# Patient Record
Sex: Female | Born: 1960 | Race: White | Hispanic: No | Marital: Married | Smoking: Current some day smoker
Health system: Southern US, Community
[De-identification: ages and names within clinical notes are randomized; demographics above are authoritative.]

## PROBLEM LIST (undated history)

## (undated) DIAGNOSIS — G4700 Insomnia, unspecified: Secondary | ICD-10-CM

## (undated) DIAGNOSIS — K649 Unspecified hemorrhoids: Secondary | ICD-10-CM

## (undated) DIAGNOSIS — R600 Localized edema: Secondary | ICD-10-CM

## (undated) DIAGNOSIS — T4145XA Adverse effect of unspecified anesthetic, initial encounter: Secondary | ICD-10-CM

## (undated) DIAGNOSIS — G459 Transient cerebral ischemic attack, unspecified: Secondary | ICD-10-CM

## (undated) DIAGNOSIS — R350 Frequency of micturition: Secondary | ICD-10-CM

## (undated) DIAGNOSIS — I809 Phlebitis and thrombophlebitis of unspecified site: Secondary | ICD-10-CM

## (undated) DIAGNOSIS — M797 Fibromyalgia: Secondary | ICD-10-CM

## (undated) DIAGNOSIS — F419 Anxiety disorder, unspecified: Secondary | ICD-10-CM

## (undated) DIAGNOSIS — M2550 Pain in unspecified joint: Secondary | ICD-10-CM

## (undated) DIAGNOSIS — I10 Essential (primary) hypertension: Secondary | ICD-10-CM

## (undated) DIAGNOSIS — Z8709 Personal history of other diseases of the respiratory system: Secondary | ICD-10-CM

## (undated) DIAGNOSIS — M810 Age-related osteoporosis without current pathological fracture: Secondary | ICD-10-CM

## (undated) DIAGNOSIS — M109 Gout, unspecified: Secondary | ICD-10-CM

## (undated) DIAGNOSIS — M62838 Other muscle spasm: Secondary | ICD-10-CM

## (undated) DIAGNOSIS — R0602 Shortness of breath: Secondary | ICD-10-CM

## (undated) DIAGNOSIS — G8929 Other chronic pain: Secondary | ICD-10-CM

## (undated) DIAGNOSIS — Z860100 Personal history of colon polyps, unspecified: Secondary | ICD-10-CM

## (undated) DIAGNOSIS — I509 Heart failure, unspecified: Secondary | ICD-10-CM

## (undated) DIAGNOSIS — IMO0002 Reserved for concepts with insufficient information to code with codable children: Secondary | ICD-10-CM

## (undated) DIAGNOSIS — K5792 Diverticulitis of intestine, part unspecified, without perforation or abscess without bleeding: Secondary | ICD-10-CM

## (undated) DIAGNOSIS — F329 Major depressive disorder, single episode, unspecified: Secondary | ICD-10-CM

## (undated) DIAGNOSIS — Z87442 Personal history of urinary calculi: Secondary | ICD-10-CM

## (undated) DIAGNOSIS — Z9889 Other specified postprocedural states: Secondary | ICD-10-CM

## (undated) DIAGNOSIS — J439 Emphysema, unspecified: Secondary | ICD-10-CM

## (undated) DIAGNOSIS — G4730 Sleep apnea, unspecified: Secondary | ICD-10-CM

## (undated) DIAGNOSIS — G43909 Migraine, unspecified, not intractable, without status migrainosus: Secondary | ICD-10-CM

## (undated) DIAGNOSIS — K449 Diaphragmatic hernia without obstruction or gangrene: Secondary | ICD-10-CM

## (undated) DIAGNOSIS — J189 Pneumonia, unspecified organism: Secondary | ICD-10-CM

## (undated) DIAGNOSIS — E785 Hyperlipidemia, unspecified: Secondary | ICD-10-CM

## (undated) DIAGNOSIS — R112 Nausea with vomiting, unspecified: Secondary | ICD-10-CM

## (undated) DIAGNOSIS — K219 Gastro-esophageal reflux disease without esophagitis: Secondary | ICD-10-CM

## (undated) DIAGNOSIS — J45909 Unspecified asthma, uncomplicated: Secondary | ICD-10-CM

## (undated) DIAGNOSIS — F32A Depression, unspecified: Secondary | ICD-10-CM

## (undated) DIAGNOSIS — M549 Dorsalgia, unspecified: Secondary | ICD-10-CM

## (undated) DIAGNOSIS — J449 Chronic obstructive pulmonary disease, unspecified: Secondary | ICD-10-CM

## (undated) DIAGNOSIS — M2540 Effusion, unspecified joint: Secondary | ICD-10-CM

## (undated) DIAGNOSIS — T8859XA Other complications of anesthesia, initial encounter: Secondary | ICD-10-CM

## (undated) DIAGNOSIS — G629 Polyneuropathy, unspecified: Secondary | ICD-10-CM

## (undated) DIAGNOSIS — I639 Cerebral infarction, unspecified: Secondary | ICD-10-CM

## (undated) DIAGNOSIS — K2970 Gastritis, unspecified, without bleeding: Secondary | ICD-10-CM

## (undated) DIAGNOSIS — G35 Multiple sclerosis: Secondary | ICD-10-CM

## (undated) DIAGNOSIS — E039 Hypothyroidism, unspecified: Secondary | ICD-10-CM

## (undated) HISTORY — PX: BACK SURGERY: SHX140

## (undated) HISTORY — DX: Localized edema: R60.0

## (undated) HISTORY — PX: CHOLECYSTECTOMY: SHX55

## (undated) HISTORY — DX: Essential (primary) hypertension: I10

## (undated) HISTORY — PX: OTHER SURGICAL HISTORY: SHX169

## (undated) HISTORY — PX: COLONOSCOPY: SHX174

## (undated) HISTORY — PX: CARDIAC CATHETERIZATION: SHX172

## (undated) HISTORY — PX: BLADDER SUSPENSION: SHX72

## (undated) HISTORY — DX: Fibromyalgia: M79.7

## (undated) HISTORY — PX: ABDOMINAL HYSTERECTOMY: SHX81

## (undated) HISTORY — DX: Gastritis, unspecified, without bleeding: K29.70

## (undated) HISTORY — DX: Gastro-esophageal reflux disease without esophagitis: K21.9

## (undated) HISTORY — PX: CARPAL TUNNEL RELEASE: SHX101

---

## 1997-12-15 ENCOUNTER — Ambulatory Visit (HOSPITAL_COMMUNITY): Admission: RE | Admit: 1997-12-15 | Discharge: 1997-12-15 | Payer: Self-pay | Admitting: General Surgery

## 1998-02-05 ENCOUNTER — Other Ambulatory Visit: Admission: RE | Admit: 1998-02-05 | Discharge: 1998-02-05 | Payer: Self-pay | Admitting: Obstetrics

## 1998-02-05 ENCOUNTER — Encounter: Admission: RE | Admit: 1998-02-05 | Discharge: 1998-02-05 | Payer: Self-pay | Admitting: Obstetrics

## 1998-02-12 ENCOUNTER — Ambulatory Visit (HOSPITAL_COMMUNITY): Admission: RE | Admit: 1998-02-12 | Discharge: 1998-02-12 | Payer: Self-pay | Admitting: Obstetrics

## 1998-03-05 ENCOUNTER — Encounter: Admission: RE | Admit: 1998-03-05 | Discharge: 1998-03-05 | Payer: Self-pay | Admitting: Obstetrics

## 1998-03-19 ENCOUNTER — Encounter: Admission: RE | Admit: 1998-03-19 | Discharge: 1998-03-19 | Payer: Self-pay | Admitting: Obstetrics

## 1998-04-16 ENCOUNTER — Encounter: Admission: RE | Admit: 1998-04-16 | Discharge: 1998-04-16 | Payer: Self-pay | Admitting: Obstetrics

## 1998-04-23 ENCOUNTER — Encounter: Admission: RE | Admit: 1998-04-23 | Discharge: 1998-04-23 | Payer: Self-pay | Admitting: Obstetrics

## 1998-04-30 ENCOUNTER — Encounter: Admission: RE | Admit: 1998-04-30 | Discharge: 1998-04-30 | Payer: Self-pay | Admitting: Obstetrics

## 1998-07-30 ENCOUNTER — Encounter: Admission: RE | Admit: 1998-07-30 | Discharge: 1998-07-30 | Payer: Self-pay | Admitting: Obstetrics

## 1998-11-25 ENCOUNTER — Observation Stay (HOSPITAL_COMMUNITY): Admission: RE | Admit: 1998-11-25 | Discharge: 1998-11-27 | Payer: Self-pay | Admitting: Urology

## 1998-12-05 ENCOUNTER — Emergency Department (HOSPITAL_COMMUNITY): Admission: EM | Admit: 1998-12-05 | Discharge: 1998-12-05 | Payer: Self-pay

## 1998-12-23 ENCOUNTER — Ambulatory Visit (HOSPITAL_COMMUNITY): Admission: RE | Admit: 1998-12-23 | Discharge: 1998-12-23 | Payer: Self-pay | Admitting: Urology

## 1999-02-18 ENCOUNTER — Encounter: Admission: RE | Admit: 1999-02-18 | Discharge: 1999-02-18 | Payer: Self-pay | Admitting: Obstetrics

## 1999-03-26 ENCOUNTER — Ambulatory Visit (HOSPITAL_COMMUNITY): Admission: RE | Admit: 1999-03-26 | Discharge: 1999-03-26 | Payer: Self-pay | Admitting: Cardiology

## 2001-02-12 ENCOUNTER — Emergency Department (HOSPITAL_COMMUNITY): Admission: EM | Admit: 2001-02-12 | Discharge: 2001-02-12 | Payer: Self-pay | Admitting: Emergency Medicine

## 2001-02-12 ENCOUNTER — Encounter: Payer: Self-pay | Admitting: Emergency Medicine

## 2001-04-03 ENCOUNTER — Ambulatory Visit (HOSPITAL_COMMUNITY): Admission: RE | Admit: 2001-04-03 | Discharge: 2001-04-03 | Payer: Self-pay | Admitting: Pediatrics

## 2001-04-03 ENCOUNTER — Encounter: Payer: Self-pay | Admitting: Pediatrics

## 2001-12-14 ENCOUNTER — Encounter: Payer: Self-pay | Admitting: Emergency Medicine

## 2001-12-14 ENCOUNTER — Emergency Department (HOSPITAL_COMMUNITY): Admission: EM | Admit: 2001-12-14 | Discharge: 2001-12-14 | Payer: Self-pay | Admitting: Emergency Medicine

## 2002-01-01 ENCOUNTER — Encounter: Payer: Self-pay | Admitting: Pediatrics

## 2002-01-01 ENCOUNTER — Ambulatory Visit (HOSPITAL_COMMUNITY): Admission: RE | Admit: 2002-01-01 | Discharge: 2002-01-01 | Payer: Self-pay | Admitting: Pediatrics

## 2002-03-03 ENCOUNTER — Inpatient Hospital Stay (HOSPITAL_COMMUNITY): Admission: EM | Admit: 2002-03-03 | Discharge: 2002-03-06 | Payer: Self-pay | Admitting: Internal Medicine

## 2002-03-03 ENCOUNTER — Encounter: Payer: Self-pay | Admitting: Internal Medicine

## 2002-03-11 ENCOUNTER — Encounter: Payer: Self-pay | Admitting: Pediatrics

## 2002-03-11 ENCOUNTER — Ambulatory Visit (HOSPITAL_COMMUNITY): Admission: RE | Admit: 2002-03-11 | Discharge: 2002-03-11 | Payer: Self-pay | Admitting: Pediatrics

## 2003-05-17 ENCOUNTER — Emergency Department (HOSPITAL_COMMUNITY): Admission: EM | Admit: 2003-05-17 | Discharge: 2003-05-17 | Payer: Self-pay | Admitting: Emergency Medicine

## 2003-12-29 ENCOUNTER — Encounter (HOSPITAL_COMMUNITY): Admission: RE | Admit: 2003-12-29 | Discharge: 2004-01-28 | Payer: Self-pay | Admitting: Family Medicine

## 2004-01-10 ENCOUNTER — Emergency Department (HOSPITAL_COMMUNITY): Admission: EM | Admit: 2004-01-10 | Discharge: 2004-01-10 | Payer: Self-pay | Admitting: Emergency Medicine

## 2004-04-06 ENCOUNTER — Ambulatory Visit (HOSPITAL_COMMUNITY): Admission: RE | Admit: 2004-04-06 | Discharge: 2004-04-06 | Payer: Self-pay | Admitting: Pediatrics

## 2004-09-13 ENCOUNTER — Inpatient Hospital Stay (HOSPITAL_COMMUNITY): Admission: AD | Admit: 2004-09-13 | Discharge: 2004-09-16 | Payer: Self-pay | Admitting: Family Medicine

## 2004-10-11 ENCOUNTER — Ambulatory Visit (HOSPITAL_COMMUNITY): Admission: RE | Admit: 2004-10-11 | Discharge: 2004-10-11 | Payer: Self-pay | Admitting: Family Medicine

## 2005-01-07 ENCOUNTER — Emergency Department (HOSPITAL_COMMUNITY): Admission: EM | Admit: 2005-01-07 | Discharge: 2005-01-08 | Payer: Self-pay | Admitting: Emergency Medicine

## 2005-03-03 ENCOUNTER — Ambulatory Visit (HOSPITAL_COMMUNITY): Admission: RE | Admit: 2005-03-03 | Discharge: 2005-03-03 | Payer: Self-pay | Admitting: Pediatrics

## 2006-03-24 ENCOUNTER — Ambulatory Visit (HOSPITAL_COMMUNITY): Admission: RE | Admit: 2006-03-24 | Discharge: 2006-03-24 | Payer: Self-pay | Admitting: Pediatrics

## 2006-03-29 ENCOUNTER — Ambulatory Visit (HOSPITAL_COMMUNITY): Admission: RE | Admit: 2006-03-29 | Discharge: 2006-03-29 | Payer: Self-pay | Admitting: Family Medicine

## 2006-04-04 ENCOUNTER — Ambulatory Visit (HOSPITAL_COMMUNITY): Admission: RE | Admit: 2006-04-04 | Discharge: 2006-04-04 | Payer: Self-pay | Admitting: Pediatrics

## 2006-04-06 ENCOUNTER — Encounter (HOSPITAL_COMMUNITY): Admission: RE | Admit: 2006-04-06 | Discharge: 2006-05-06 | Payer: Self-pay | Admitting: Pediatrics

## 2006-04-14 ENCOUNTER — Encounter (INDEPENDENT_AMBULATORY_CARE_PROVIDER_SITE_OTHER): Payer: Self-pay | Admitting: Specialist

## 2006-04-14 ENCOUNTER — Ambulatory Visit (HOSPITAL_COMMUNITY): Admission: RE | Admit: 2006-04-14 | Discharge: 2006-04-14 | Payer: Self-pay | Admitting: General Surgery

## 2006-09-04 ENCOUNTER — Ambulatory Visit (HOSPITAL_COMMUNITY): Admission: RE | Admit: 2006-09-04 | Discharge: 2006-09-04 | Payer: Self-pay | Admitting: Pediatrics

## 2007-10-23 ENCOUNTER — Ambulatory Visit: Payer: Self-pay | Admitting: Gastroenterology

## 2007-10-25 ENCOUNTER — Emergency Department (HOSPITAL_COMMUNITY): Admission: EM | Admit: 2007-10-25 | Discharge: 2007-10-25 | Payer: Self-pay | Admitting: Emergency Medicine

## 2007-10-29 ENCOUNTER — Encounter: Payer: Self-pay | Admitting: Gastroenterology

## 2007-10-29 ENCOUNTER — Ambulatory Visit: Payer: Self-pay | Admitting: Gastroenterology

## 2007-10-29 ENCOUNTER — Ambulatory Visit (HOSPITAL_COMMUNITY): Admission: RE | Admit: 2007-10-29 | Discharge: 2007-10-29 | Payer: Self-pay | Admitting: Gastroenterology

## 2007-10-29 HISTORY — PX: ESOPHAGOGASTRODUODENOSCOPY: SHX1529

## 2007-11-06 ENCOUNTER — Ambulatory Visit (HOSPITAL_COMMUNITY): Admission: RE | Admit: 2007-11-06 | Discharge: 2007-11-06 | Payer: Self-pay | Admitting: Gastroenterology

## 2008-01-29 ENCOUNTER — Ambulatory Visit: Payer: Self-pay | Admitting: Gastroenterology

## 2008-01-31 ENCOUNTER — Ambulatory Visit: Payer: Self-pay | Admitting: Gastroenterology

## 2008-01-31 ENCOUNTER — Ambulatory Visit (HOSPITAL_COMMUNITY): Admission: RE | Admit: 2008-01-31 | Discharge: 2008-01-31 | Payer: Self-pay | Admitting: Gastroenterology

## 2008-01-31 ENCOUNTER — Encounter: Payer: Self-pay | Admitting: Gastroenterology

## 2008-01-31 HISTORY — PX: SIGMOIDOSCOPY: SUR1295

## 2008-08-15 ENCOUNTER — Ambulatory Visit (HOSPITAL_COMMUNITY): Admission: RE | Admit: 2008-08-15 | Discharge: 2008-08-15 | Payer: Self-pay | Admitting: Pediatrics

## 2008-09-14 ENCOUNTER — Encounter: Admission: RE | Admit: 2008-09-14 | Discharge: 2008-09-14 | Payer: Self-pay | Admitting: Neurosurgery

## 2009-01-14 ENCOUNTER — Observation Stay (HOSPITAL_COMMUNITY): Admission: EM | Admit: 2009-01-14 | Discharge: 2009-01-16 | Payer: Self-pay | Admitting: *Deleted

## 2009-09-15 ENCOUNTER — Ambulatory Visit (HOSPITAL_COMMUNITY): Admission: RE | Admit: 2009-09-15 | Discharge: 2009-09-15 | Payer: Self-pay | Admitting: Pediatrics

## 2010-06-29 ENCOUNTER — Ambulatory Visit (HOSPITAL_COMMUNITY): Admission: RE | Admit: 2010-06-29 | Discharge: 2010-06-29 | Payer: Self-pay | Admitting: Pediatrics

## 2010-08-11 ENCOUNTER — Ambulatory Visit (HOSPITAL_COMMUNITY)
Admission: RE | Admit: 2010-08-11 | Discharge: 2010-08-11 | Payer: Self-pay | Admitting: Physical Medicine and Rehabilitation

## 2010-09-14 ENCOUNTER — Encounter: Payer: Self-pay | Admitting: Gastroenterology

## 2010-09-24 ENCOUNTER — Ambulatory Visit (HOSPITAL_COMMUNITY)
Admission: RE | Admit: 2010-09-24 | Discharge: 2010-09-24 | Payer: Self-pay | Admitting: Physical Medicine and Rehabilitation

## 2010-10-05 ENCOUNTER — Ambulatory Visit (HOSPITAL_COMMUNITY)
Admission: RE | Admit: 2010-10-05 | Discharge: 2010-10-05 | Payer: Self-pay | Source: Home / Self Care | Admitting: Gastroenterology

## 2010-11-29 ENCOUNTER — Encounter: Payer: Self-pay | Admitting: Pediatrics

## 2010-12-07 NOTE — Letter (Signed)
Summary: TRIAGE ORDER  TRIAGE ORDER   Imported By: Ave Filter 09/14/2010 15:48:45  _____________________________________________________________________  External Attachment:    Type:   Image     Comment:   External Document  Appended Document: TRIAGE ORDER PT CANCELLED APPOINTMENT SHE SAID HER SISTER HAS HAD A HEARTATTACK AND SHE HAS TO GO BE WITH HER AND SHE WILL CALL us BACK TO R/S APPT/LAW

## 2011-01-18 LAB — BASIC METABOLIC PANEL
CO2: 32 mEq/L (ref 19–32)
Glucose, Bld: 130 mg/dL — ABNORMAL HIGH (ref 70–99)
Potassium: 4.9 mEq/L (ref 3.5–5.1)
Sodium: 136 mEq/L (ref 135–145)

## 2011-01-18 LAB — HEMOGLOBIN AND HEMATOCRIT, BLOOD
HCT: 39.9 % (ref 36.0–46.0)
Hemoglobin: 14 g/dL (ref 12.0–15.0)

## 2011-01-18 LAB — TSH: TSH: 3.151 u[IU]/mL (ref 0.350–4.500)

## 2011-02-17 LAB — URINE MICROSCOPIC-ADD ON

## 2011-02-17 LAB — URINALYSIS, ROUTINE W REFLEX MICROSCOPIC
Bilirubin Urine: NEGATIVE
Glucose, UA: NEGATIVE mg/dL
Ketones, ur: NEGATIVE mg/dL
Nitrite: NEGATIVE
Specific Gravity, Urine: 1.013 (ref 1.005–1.030)
pH: 6.5 (ref 5.0–8.0)

## 2011-02-17 LAB — GLUCOSE, CAPILLARY
Glucose-Capillary: 108 mg/dL — ABNORMAL HIGH (ref 70–99)
Glucose-Capillary: 109 mg/dL — ABNORMAL HIGH (ref 70–99)
Glucose-Capillary: 91 mg/dL (ref 70–99)
Glucose-Capillary: 99 mg/dL (ref 70–99)

## 2011-02-17 LAB — CBC
HCT: 39.3 % (ref 36.0–46.0)
Hemoglobin: 13.9 g/dL (ref 12.0–15.0)
Platelets: 193 10*3/uL (ref 150–400)
WBC: 4.6 10*3/uL (ref 4.0–10.5)

## 2011-02-17 LAB — COMPREHENSIVE METABOLIC PANEL
ALT: 51 U/L — ABNORMAL HIGH (ref 0–35)
Albumin: 3.7 g/dL (ref 3.5–5.2)
Alkaline Phosphatase: 83 U/L (ref 39–117)
BUN: 6 mg/dL (ref 6–23)
Chloride: 100 mEq/L (ref 96–112)
Glucose, Bld: 156 mg/dL — ABNORMAL HIGH (ref 70–99)
Potassium: 3.4 mEq/L — ABNORMAL LOW (ref 3.5–5.1)
Sodium: 139 mEq/L (ref 135–145)
Total Bilirubin: 0.5 mg/dL (ref 0.3–1.2)

## 2011-02-17 LAB — DIFFERENTIAL
Basophils Absolute: 0 10*3/uL (ref 0.0–0.1)
Basophils Relative: 1 % (ref 0–1)
Eosinophils Absolute: 0.2 10*3/uL (ref 0.0–0.7)
Monocytes Absolute: 0.3 10*3/uL (ref 0.1–1.0)
Neutro Abs: 2 10*3/uL (ref 1.7–7.7)

## 2011-02-17 LAB — APTT: aPTT: 29 seconds (ref 24–37)

## 2011-02-25 ENCOUNTER — Other Ambulatory Visit: Payer: Self-pay | Admitting: Urology

## 2011-02-25 ENCOUNTER — Ambulatory Visit (INDEPENDENT_AMBULATORY_CARE_PROVIDER_SITE_OTHER): Payer: 59 | Admitting: Urology

## 2011-02-25 DIAGNOSIS — R109 Unspecified abdominal pain: Secondary | ICD-10-CM

## 2011-02-25 DIAGNOSIS — N3946 Mixed incontinence: Secondary | ICD-10-CM

## 2011-02-25 DIAGNOSIS — R1031 Right lower quadrant pain: Secondary | ICD-10-CM

## 2011-02-28 ENCOUNTER — Ambulatory Visit (HOSPITAL_COMMUNITY)
Admission: RE | Admit: 2011-02-28 | Discharge: 2011-02-28 | Disposition: A | Discharge status: Home / Self Care | Payer: 59 | Source: Ambulatory Visit | Attending: Urology | Admitting: Urology

## 2011-02-28 DIAGNOSIS — R1903 Right lower quadrant abdominal swelling, mass and lump: Secondary | ICD-10-CM | POA: Insufficient documentation

## 2011-02-28 DIAGNOSIS — R1031 Right lower quadrant pain: Secondary | ICD-10-CM | POA: Insufficient documentation

## 2011-03-11 ENCOUNTER — Ambulatory Visit: Payer: 59 | Admitting: Urology

## 2011-03-11 ENCOUNTER — Emergency Department (HOSPITAL_COMMUNITY)
Admission: EM | Admit: 2011-03-11 | Discharge: 2011-03-11 | Disposition: A | Discharge status: Home / Self Care | Payer: 59 | Attending: Emergency Medicine | Admitting: Emergency Medicine

## 2011-03-11 ENCOUNTER — Emergency Department (HOSPITAL_COMMUNITY): Payer: 59

## 2011-03-11 DIAGNOSIS — R079 Chest pain, unspecified: Secondary | ICD-10-CM | POA: Insufficient documentation

## 2011-03-11 DIAGNOSIS — E039 Hypothyroidism, unspecified: Secondary | ICD-10-CM | POA: Insufficient documentation

## 2011-03-11 DIAGNOSIS — I10 Essential (primary) hypertension: Secondary | ICD-10-CM | POA: Insufficient documentation

## 2011-03-11 DIAGNOSIS — Z79899 Other long term (current) drug therapy: Secondary | ICD-10-CM | POA: Insufficient documentation

## 2011-03-11 DIAGNOSIS — E119 Type 2 diabetes mellitus without complications: Secondary | ICD-10-CM | POA: Insufficient documentation

## 2011-03-11 DIAGNOSIS — R071 Chest pain on breathing: Secondary | ICD-10-CM | POA: Insufficient documentation

## 2011-03-11 DIAGNOSIS — G8929 Other chronic pain: Secondary | ICD-10-CM | POA: Insufficient documentation

## 2011-03-11 LAB — BASIC METABOLIC PANEL
CO2: 33 mEq/L — ABNORMAL HIGH (ref 19–32)
Calcium: 9.1 mg/dL (ref 8.4–10.5)
Creatinine, Ser: 0.62 mg/dL (ref 0.4–1.2)
GFR calc Af Amer: >60 mL/min (ref >60)
Sodium: 140 mEq/L (ref 135–145)

## 2011-03-11 LAB — POCT CARDIAC MARKERS
Myoglobin, poc: 51.2 ng/mL (ref 12–200)
Troponin i, poc: <0.05 ng/mL (ref 0.00–0.09)

## 2011-03-11 LAB — DIFFERENTIAL
Basophils Absolute: 0 10*3/uL (ref 0.0–0.1)
Basophils Relative: 0 % (ref 0–1)
Lymphocytes Relative: 34 % (ref 12–46)
Monocytes Absolute: 0.9 10*3/uL (ref 0.1–1.0)
Monocytes Relative: 9 % (ref 3–12)
Neutro Abs: 5.6 10*3/uL (ref 1.7–7.7)
Neutrophils Relative %: 57 % (ref 43–77)

## 2011-03-11 LAB — CBC
HCT: 37.5 % (ref 36.0–46.0)
Hemoglobin: 12.6 g/dL (ref 12.0–15.0)
MCH: 29.1 pg (ref 26.0–34.0)
MCHC: 33.6 g/dL (ref 30.0–36.0)
RBC: 4.33 MIL/uL (ref 3.87–5.11)

## 2011-03-22 NOTE — Op Note (Signed)
[name], [name]                ACCOUNT NO.:  [id number]   MEDICAL RECORD NO.:  [id number]          PATIENT TYPE:  AMB   LOCATION:  DAY                           FACILITY:  [facility]   PHYSICIAN:  Kassie Mends, M.D.      DATE OF BIRTH:  07-19-1961   DATE OF PROCEDURE:  01/31/2008  DATE OF DISCHARGE:                               OPERATIVE REPORT   REFERRING Winni Ehrhard:  Francoise Schaumann. Halm, DO.   PROCEDURE:  Sigmoidoscopy.   INDICATION FOR EXAM:  [name] is a 50 year old female who presented  as an outpatient complaining of rectal bleeding.  She has also  complained of having difficulty having a bowel movement.   FINDINGS:  1. The patient reports taking entire bowel prep and she was clear.      Began to see formed stool beginning approximately 40-50 cm from the      anal verge.  No liquid stool was seen in the lumen.  The      colonoscopy was changed to a sigmoidoscopy.  2. Rare sigmoid diverticula.  3. Large internal hemorrhoids.  4. Small rectal polyp removed via cold forceps.   DIAGNOSIS:  [name] rectal bleeding is secondary to large internal  hemorrhoids.   RECOMMENDATIONS:  1. Screening colonoscopy in 5 years with a 2-day bowel prep.  2. She should follow a high-fiber diet.  She was given information on      high-fiber diet, constipation, diverticulosis and hemorrhoids.  If      her rectal bleeding continues, then she should use Anusol-HC      suppositories every 12 hours for 14 days.  3. Will add a stool softener twice a day.   MEDICATIONS:  1. Fentanyl 125 mcg IV.  2. Versed 6 mg IV.  3. Phenergan 25 mg IV.   PROCEDURE TECHNIQUE:  Physical exam was performed.  Informed consent was  obtained from the patient after explaining the benefits, risks and  alternatives to the procedure.  The patient was connected to the monitor  and placed in the left lateral position.  Continuous oxygen was provided  by nasal cannula and IV medicine administered through an indwelling  cannula.  After administration of sedation and rectal exam, the  patient's rectum was intubated and the scope was advanced under direct  visualization to approximately 50 cm from the anal verge.  The  colonoscopy was changed to a sigmoidoscopy due to a large amount of  formed stool in the lumen.  The scope was removed slowly by carefully  examining the color, texture, anatomy and integrity of the mucosa on the  way out.  The patient was recovered in endoscopy and discharged home in  satisfactory condition.      Kassie Mends, M.D.  Electronically Signed     SM/MEDQ  D:  01/31/2008  T:  01/31/2008  Job:  [id number]   cc:   Francoise Schaumann. Milford Cage DO, FAAP  Fax: [phone]

## 2011-03-22 NOTE — Op Note (Signed)
[name], [name]                ACCOUNT NO.:  [id number]   MEDICAL RECORD NO.:  [id number]          PATIENT TYPE:  AMB   LOCATION:  DAY                           FACILITY:  [facility]   PHYSICIAN:  Kassie Mends, M.D.      DATE OF BIRTH:  [birth year]   DATE OF PROCEDURE:  10/29/2007  DATE OF DISCHARGE:                               OPERATIVE REPORT   PROCEDURE:  Esophagogastroduodenoscopy with cold forceps biopsy and  Bravo capsule placement.   INDICATION FOR EXAM:  [name] is a 50 year old female who complains  of having indigestion and nausea for 10-12 years.  She says sometimes  after eating her stomach feels like a rock.  She reports getting choked.  She has been on Prilosec and still complains of burning and bubbling in  her esophagus.  She eats Tums to treat her symptoms.  The EGD is being  performed to evaluate new-onset dyspepsia and to place a Bravo capsule  to evaluate for uncontrolled gastroesophageal reflux disease.   FINDINGS:  1. Normal esophagus without evidence of Barrett's, mass, erosion,      ulceration or stricture.  2. Occasional erosions seen in the antrum with patchy erythema.      Biopsies obtained via cold forceps to evaluate for H. pylori      gastritis.  3. Normal duodenal bulb and second portion of the duodenum.  4. GE junction located at 39 cm from the teeth.  The Bravo capsule was      placed 33 cm from the teeth.   DIAGNOSES:  1. Gastritis.  2. Gastric polyps.   RECOMMENDATIONS:  1. We will call [name] with the results of her gastric polyps.  We      will also call her with the results of the antral biopsies.  2. No aspirin or NSAIDs for 30 days.  3. No anticoagulation for 7 days.  4. She should resume her previous diet but avoid gastric irritants.      She is given a handout on gastric irritants and gastritis.  5. She needs an abdominal ultrasound to complete her evaluation for      elevated liver enzymes.  The most likely etiology for  mildly      elevated liver enzymes is fatty liver disease.   MEDICATIONS:  1. Demerol 50 mg IV.  2. Versed 6 mg IV.   PROCEDURE TECHNIQUE:  Physical exam was performed.  Informed consent was  obtained from the patient after explaining the benefits, risks and  alternatives to the procedure.  The patient was connected to the monitor  and placed in the left lateral position.  Continuous oxygen was provided  by nasal cannula and IV medicine administered through an indwelling  cannula.  After administration of sedation, the patient's esophagus was  intubated and the scope was advanced under direct visualization to the  second portion of the duodenum.  The scope was withdrawn slowly by  carefully examining the color, texture, anatomy and integrity of the  mucosa on the way out.  The Bravo capsule was introduced and advanced to  33 cm from the teeth.  Suction was applied and the device was released.  The patient's esophagus was intubated with the diagnostic gastroscope.  Adherence to the sidewalls of the esophagus was confirmed.  The  introducer and the scope were withdrawn.  The patient was recovered in  endoscopy and discharged home in satisfactory condition.   ADDENDUM:  Path showed benign polyps and chronic gastrtis.      Kassie Mends, M.D.  Electronically Signed     SM/MEDQ  D:  10/29/2007  T:  10/29/2007  Job:  [id number]   cc:   Francoise Schaumann. Milford Cage DO, FAAP  Fax: [phone]

## 2011-03-22 NOTE — Op Note (Signed)
NAMEMERISA, JULIO                ACCOUNT NO.:  [id number]   MEDICAL RECORD NO.:  [id number]          PATIENT TYPE:  AMB   LOCATION:  DAY                           FACILITY:  [facility]   PHYSICIAN:  Kassie Mends, M.D.      DATE OF BIRTH:  27-May-1961   DATE OF PROCEDURE:  10/29/2007  DATE OF DISCHARGE:  10/29/2007                               OPERATIVE REPORT   REFERRING PHYSICIAN:  Vivia Ewing, DO, FAAP.   PROCEDURE:  Bravo capsule study for 48 hours.   DATE OF BRAVO READ:  October 31, 2007.   INDICATION FOR EXAM:  Ms. Kesselman is a 50 year old female who complains  of persistent heartburn and indigestion in spite of once daily  omeprazole.  She carries a diagnosis of gastroesophageal reflux disease  for the last 10-12 years.  Bravo capsule was placed on October 29, 2007.   FINDINGS:  On day 1, Ms. Rigg had 90 episodes of reflux.  Seven  episodes lasted greater than 5 minutes.  The longest duration of reflux  was 14 minutes.  She spent 133 minutes at a pH of less than 4.  The  DeMeester score on day 1 was 36.1 (normal less than 14.72).  On day 2,  she had a total of 113 episodes of reflux.  Six episodes lasted greater  than 5 minutes.  Her longest episode of reflux lasted 23 minutes.  She  spent 145 minutes at a pH less than 4.  Her DeMeester score on day 2 was  34.   For the 48 hour study, she had 203 episodes of reflux.  Thirteen  episodes lasted greater 5 minutes.  The longest duration of reflux was  23 minutes.  She spent 278 minutes at a pH less than 4.  Her symptom-  associated probability was 99.7% other and 60.3% regurgitation.   DIAGNOSIS:  Gastroesophageal reflux disease inadequately controlled on  once a day proton pump inhibitor.   RECOMMENDATIONS:  1. Ms. Dowse should increase her omeprazole to 20 mg 30 minutes      before her first and last meal.  If her symptoms persist then will      consider changing her to Zegerid.  2. She should follow the [facility]  recommendations for the      management of gastroesophageal reflux disease.  She should avoid      carbonated beverages.  3. OPV with [facility] in late Jan or Feb 2009.      Kassie Mends, M.D.  Electronically Signed     SM/MEDQ  D:  11/03/2007  T:  11/04/2007  Job:  [id number]   cc:   Francoise Schaumann. Milford Cage DO, FAAP  Fax: [phone]

## 2011-03-22 NOTE — Consult Note (Signed)
NAMEGENTRY, SEEBER                ACCOUNT NO.:  [id number]   MEDICAL RECORD NO.:  [id number]          PATIENT TYPE:  AMB   LOCATION:  DAY                           FACILITY:  [facility]   PHYSICIAN:  Kassie Mends, M.D.      DATE OF BIRTH:  December 15, 1960   DATE OF CONSULTATION:  10/23/2007  DATE OF DISCHARGE:                                 CONSULTATION   REASON FOR CONSULTATION:  Abdominal pain.   HPI:  Ms. Ryback is a 50 year old female who has a significant past  medical history of her stomach hurting, indigestion, and nausea for  approximately 10 to 12 years.  She had her gallbladder taken out for  these symptoms and states she had some relief after that was done.  Sometimes she hurts as she eats and sometimes she hurts when she does  not eat.  She states that when she eats and stands up she feels like her  stomach is going to drop.  Sometimes, after eating, her stomach feels  like a rock.  She has a normal bowel movement every morning.  She says  her appetite is none but has not had any weight loss.  She reports  sometimes getting choked.  She takes her Prilosec around 10 a.m. before  she eats her first meal.  She complains of a burning and a bubbling in  her esophagus.  She eats Tums to treat those symptoms.  If she bends  over, the burning-tasting liquid just rolls into the back of her  throat.  It does not matter what she eats.  She denies any vomiting,  diarrhea, or nausea.  She had an upper endoscopy and a colonoscopy for  similar symptoms in the past.  She denies any use of over-the-counter  medicines, NSAIDs, turning yellow, itching, or any history of H. pylori  infection.  She does eat ice cream.  She drinks one 20-ounce Dr. Reino Kent  a day.  She has had none in the last 2 months.  She eats sugar-free gum  frequently.  She does not drink any fruit juice because it makes her  symptoms worse.   PAST MEDICAL HISTORY:  1. Fibromyalgia.  2. Hypertension.  3. Hiatal hernia.  4. Fluid  in her legs.   PAST SURGICAL HISTORY:  1. Hysterectomy at age 89 for fibroids, pain, and bleeding.  2. Carpal tunnel syndrome.   ALLERGIES:  1. PENICILLIN.  2. SULFA.  3. DEMEROL.   MEDICATIONS:  1. Flexeril 10 mg t.i.d.  2. Xanax 0.5 mg 1 to 2 times a day as needed.  3. Prilosec 20 mg a day.  4. Levothyroxine 150 mcg daily.  5. Depakote ER 500 mg q.h.s.  6. Metoprolol 100 mg daily.  7. Lasix 20 mg daily.  8. Norco 10/325 every 4 hours as needed for pain.   FAMILY HISTORY:  She denies any family history of colon cancer, colon  polyps, liver disease, or pancreas problems.   SOCIAL HISTORY:  She is married and has a bad back.  She denies any  tobacco use or alcohol use.  REVIEW OF SYSTEMS:  Per the HPI, otherwise all systems negative.   PHYSICAL EXAMINATION:  Weight 172 pounds.  Height 5 feet 6 inches.  BMI  27.8 (overweight).  Temperature is 97.7.  Blood pressure 120/88.  Pulse  80.  GENERAL:  She is in no apparent distress, alert and oriented x4.  HEENT:  Atraumatic, normocephalic.  Pupils equal and reactive to light.  Mouth, no oral lesions.  Posterior pharynx without erythema or exudate.  LUNGS:  Clear to auscultation bilaterally.  CARDIOVASCULAR:  Exam is a regular rhythm.  No murmur.  Normal S1 and  S2.  ABDOMEN:  Bowel sounds are present.  Soft, nontender, and nondistended.  No rebound or guarding, obese.  EXTREMITIES:  Without cyanosis, clubbing, or edema.  NEURO:  She has no focal neurologic deficits.   ASSESSMENT:  Ms. Baccam is a 50 year old female who complains of  symptoms that may be consistent with gastroesophageal reflux disease  which is poorly controlled on omeprazole 20 mg once a day.  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  The differential diagnosis includes small bowel bacterial  overgrowth.  She also reportedly has elevated liver enzymes.  She had a  isolated elevation in her ALT to 57 in June of  2008.  The rest of her  hepatic function panel was normal.  She has elevated liver enzymes which  are likely secondary to fatty liver disease.  The differential diagnosis  includes autoimmune hepatitis.   Thank you for allowing me to see Ms. Herrmann in consultation.  My  recommendations follow.   RECOMMENDATIONS:  1. She will be scheduled for an upper endoscopy on 10/29/2007 to      evaluate her acid reflux and for a Bravo study.  2. She she avoid sugar-free gum and follow the lactose-free gas and      flatulence prevention diet.  She was given a handout.  She is to      add probiotics.  She was given samples once a day.  3. We will check a hepatic function panel, anti-smooth muscle      antibody, ANA, and quantitative immunoglobulin.  4. She has a followup appointment to see me in 6 weeks.  She was given      her discharge instructions in writing.      Kassie Mends, M.D.  Electronically Signed    SM/MEDQ  D:  10/23/2007  T:  10/24/2007  Job:  [id number]   cc:   Francoise Schaumann. Milford Cage DO, FAAP  Fax: [phone]

## 2011-03-22 NOTE — H&P (Signed)
[name], [name] NO.:  [id number]   MEDICAL RECORD NO.:  [id number]          PATIENT TYPE:  INP   LOCATION:  4705                         FACILITY:  [facility]   PHYSICIAN:  Marlan Palau, M.D.  DATE OF BIRTH:  04-Apr-1961   DATE OF ADMISSION:  01/14/2009  DATE OF DISCHARGE:                              HISTORY & PHYSICAL   HISTORY OF PRESENT ILLNESS:  [name] is a 50 year old right-handed  white female, born [birth year], with a history of migraine headaches.  This patient has been seen by Dr. Vickey Huger from our group in the past  for these events.  The patient has had several events.  She claims up to  4 in the past that have been associated with left-sided weakness and  numbness.  The patient claims the last such episode occurred in the  spring of 2009.  Through [facility] medical records, patient has had events  dating the back to 2000.  The patient had another event in 2003.  The  patient claims that she began having a headache on the day prior to  admission probably 8:30 p.m.  The patient went to bed around 10:30 p.m.  and awoke around 3:30 a.m. to go the bathroom on March 10.  The patient  was doing well at that point.  Upon awakening this morning, however, the  patient noted some left-sided weakness, left-sided pain involving the  face, arm, and leg, and left-sided headache.  The patient could not  talk.  Onset of deficit was around 5:30 in the morning when she noticed  it last seen normal at 3:30 a.m.  The patient is brought in as a code  stroke, but this was cancelled.  CT scan of the brain was done.  Neurology was asked to see this patient for further evaluation.  The  patient's ability to speak has improved, but the left-sided weakness,  pain, and numbness have persisted.   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  2. Fibromyalgia.  3. Gastroesophageal reflux disease.  4.  Migraine headache.  5. Low back pain.  6. Hypertension.  7. Hypothyroidism.  8. Renal calculi.  9. Bladder resuspension procedure.  10.Gallbladder resection.  11.Hysterectomy.  12.Right carpal tunnel syndrome surgery.  13.Diabetes.  14.Gastritis.  15.Dyslipidemia.   The patient has allergy to:  1. PENICILLIN.  2. SULFA DRUGS.  3. DEMEROL.   Does not smoke or drink.   CURRENT MEDICATIONS:  1. Metformin 500 mg in the morning.  2. Nortriptyline 25 mg at bedtime.  3. Xanax 0.5 mg twice daily.  4. Metoprolol 100 mg twice daily.  5. Flexeril 10 mg 4 times daily.  6. Depakote 500 mg at bedtime.  7. Synthroid 0.175 mg daily.  8. Vicodin if needed.  9. Oxycodone 5 mg if needed.  10.Zocor 20 mg daily.   SOCIAL HISTORY:  This patient is married and lives in the [place] area.  The patient has 2 children.  Does not work.  One  daughter has multiple sclerosis.  FAMILY MEDICAL HISTORY:  Notable that mother died with stroke, diabetes,  heart disease, and hypertension.  Father is still alive with  hypercholesterolemia and heart disease.  The patient has 3 sisters and 2  brothers, most of them have heart disease, diabetes, hypertension, and  hypercholesterolemia.   REVIEW OF SYSTEMS:  Notable for episodes of feeling hot.  The patient  notes headache with migraine, currently 2-3 times a week.  The patient  does have some left neck pain that began over the last several days.  The patient does note some shortness of breath with effort.  Denies  chest pain or palpitations.  Has not any problems controlling bowels or  bladder.  Does have some slight nausea with a headache.  Denies any  blackout episodes or dizziness.   PHYSICAL EXAMINATION:  VITAL SIGNS:  Blood pressure is 155/94, heart  rate 92, respiratory rate 12, and temperature afebrile.  GENERAL:  This patient is a minimally obese white female who is alert  and cooperative at the time of examination.  HEENT:   Head is atraumatic.  Eyes; pupils are equal, round, and reactive  to light.  Discs are flat bilaterally.  NECK:  Supple.  No carotid bruits noted.  RESPIRATORY:  Clear.  CARDIOVASCULAR:  Regular rate and rhythm.  No obvious murmurs or rubs  noted.  EXTREMITIES:  Without significant edema.  ABDOMEN:  Positive bowel sounds.  No organomegaly or tenderness noted.  NEUROLOGIC:  Cranial nerves as above.  Facial symmetry is present.  The  patient has full extraocular and visual fields are full.  Speech is well  enunciated, not aphasic.  The patient notes decreased pinprick sensation  on the left face as compared to the right, splits midline with vibratory  sensation on the forehead, decreased on the left.  No aphasia noted.  Motor testing reveals poor effort with the left arm and left leg.  Left  arm drifts to the bed as does the left leg.  The patient notes almost  complete anesthesia to pinprick soft touch, and vibratory sensation on  left arm and left legs, spare the right.  The patient has no ataxia with  finger-nose-finger and heel-to-shin.  The patient extincts with double  simultaneous stimulation on left arm and left leg.  The patient has  symmetric reflexes.  Toes downgoing bilaterally.  The patient was not  ambulated.   LABORATORY VALUES:  Notable for a white count of 4.6, hemoglobin of  13.9, hematocrit of 39.3, MCV of 88.1, and platelets of 193.  Sodium  139, potassium 3.4, chloride of 100, CO2 of 27, glucose of 156, BUN of  6, creatinine 0.58, calcium 9.3, total protein 7.2, albumin of 3.7, AST  of 31, and ALT of 51.  Urinalysis reveals specific gravity of 1.013, pH  of 6.5, otherwise unremarkable with 0-2 red cells and white cells.  Chest x-ray shows streaky bibasilar atelectasis, no infiltrates.  CT of  the head shows no acute changes.   EKG reveals normal sinus rhythm, nonspecific Q wave abnormalities, heart  rate 72.   IMPRESSION:  1. Psychogenic stroke event with left  hemiparesis and hemisensory      deficit.  2. History of migraine headaches.  3. Diabetes.  4. Hypertension.  5. Dyslipidemia.  6. Fibromyalgia.   This patient has multiple risk factors for stroke, but a clinical  examination today suggests a psychogenic event.  The patient will  require workup to exclude any organic cause for her problems.  Need to  rule out a small stroke with embellishment on top of that.  The patient  has had similar events in the past and this likely represents another  event.  The patient will undergo physical and occupational therapy.  If  she does not improve, we will need to get psychiatric evaluation for  this patient.  I have discussed the diagnosis with the family.       Marlan Palau, M.D.  Electronically Signed     CKW/MEDQ  D:  01/14/2009  T:  01/14/2009  Job:  [id number]   cc:   [facility]

## 2011-03-22 NOTE — Consult Note (Signed)
NAMEELIZETH, WEINRICH                ACCOUNT NO.:  [id number]   MEDICAL RECORD NO.:  [id number]          PATIENT TYPE:  INP   LOCATION:  4705                         FACILITY:  [facility]   PHYSICIAN:  Antonietta Breach, M.D.  DATE OF BIRTH:  08-21-1961   DATE OF CONSULTATION:  01/16/2009  DATE OF DISCHARGE:  01/16/2009                                 CONSULTATION   REQUESTING PHYSICIAN:  Dr. Marlan Palau   REASON FOR CONSULTATION:  Rule out somatoform disorder, anxiety.   HISTORY OF PRESENT ILLNESS:  Mrs. Arch is a 50 year old female  admitted to the [facility] on January 14, 2009, due to left  hemiparesis.   She does describe openly a significant amount of stress in her life.  She does not have any indifference to her somatic symptoms but sees them  as a major distress.  She also describes stress at home, having a  husband as a Education officer, environmental.   She does not have any thoughts of harming herself or others.  She has no  hallucinations or delusions.  Her orientation and memory function are  intact.  She is cooperative with bedside care.   She presented with left side weakness, and she was close to receiving  tPA.   Neurology evaluated her, and her organic workup was negative.  She also  has a history of other workups that have been negative.   She does have regular feeling on edge and muscle tension with  significant worry at this point controlled by Xanax 0.5 mg b.i.d.  She  receives nortriptyline 25 mg at bedtime.   It is noted that her husband is forced to wait on her when she exhibits  these neurologic symptoms.   PAST PSYCHIATRIC HISTORY:  Mrs. Chrestman does have a history of long-term  anxiety with symptoms as described above.   In review of the past medical record, Xanax was listed in March 2009.   FAMILY PSYCHIATRIC HISTORY:  None known.   SOCIAL HISTORY:  Mrs. Granderson states they have a mutually supportive  marriage.  However, he is away from the home a lot.  She does not  use  alcohol or illegal drugs.  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  Mrs. Voth has two  children, and one has multiple sclerosis.   PAST MEDICAL HISTORY:  1. Migraine headache.  2. Diabetes mellitus.  3. Hypertension.  4. Dyslipidemia.  5. Fibromyalgia.  6. Nephrolithiasis.   ALLERGIES:  1. SULFA.  2. PENICILLIN.  3. DEMEROL.   LABORATORY DATA:  MRI with and without contrast unremarkable.  Sodium  139, BUN 6, creatinine 0.58, SGOT 31, SGPT 51.  WBC is 4.6, hemoglobin  13.9, platelet count 193.   REVIEW OF SYSTEMS:  CONSTITUTIONAL/HEENT/MOUTH/NEUROLOGIC/PSYCHIATRIC/CARDIOVASCULAR/RESPIRA  TORY/  GASTROINTESTINAL/GENITOURINARY/SKIN/MUSCULOSKELETAL/HEMATOLOGIC/LYMPHATI  C/ENDOCRINE METABOLIC:  All unremarkable.   PHYSICAL EXAMINATION:  VITAL SIGNS:  Temperature 97.2, pulse 73,  respiratory rate 20, blood pressure 128/71.  O2 saturation on room air  97%.  GENERAL APPEARANCE:  Mrs. Eddie is a middle-aged female lying in a  supine position in her hospital bed with no abnormal involuntary  movements.  MENTAL STATUS EXAMINATION:  Mrs. Dwyer is alert.  She has intact eye  contact.  Her affect is mildly anxious.  Mood is mildly anxious.  Concentration is normal.  She is completely oriented to all spheres.  Her memory is intact for immediate, recent, and remote.  Her fund of  knowledge and intelligence are within normal limits.  Her speech  involves normal rate and prosody.  There is no dysarthria.   Thought process is logical, coherent, goal directed.  No looseness of  associations.  Thought content:  No thoughts of harming herself or  others, no delusions, no hallucinations.  Insight is partial.  Judgment  is intact.   ASSESSMENT:  Axis I:  293.84  Anxiety disorder, not otherwise specified.  Rule out somatoform disorder, not otherwise specified.  Axis II:  Deferred.  Axis III:  See past medical history.  Axis IV:  General medical.  Axis V:  55.   Mrs. Romberg agrees to call  emergency services for any psychiatric or  emergency symptoms.   The undersigned provided ego supportive psychotherapy and education  regarding the possibility of a somatoform disorder.   The undersigned also mentioned how psychotherapy could be effective for  anxiety and potentially eliminate her need for Xanax.   The patient was also educated on how psychotherapy could provide the  necessary environment for relief of her somatoform condition if it was  present.   An extensive amount of time was utilized to explain that a somatoform  condition does involve a completely unconscious phenomenon outside of  the will, desire, and choice of the patient.  She understood and was not  offended by the possibility that she might have a somatoform condition.  She was simply motivated to get rid of her symptoms.   She agreed that if all organic assessments were exhausted that she would  proceed with psychotherapy.   Regarding her Xanax, I would proceed with the Xanax for now with the  goal of cognitive behavioral therapy eliminating the need for Xanax.  The cognitive behavioral therapy could be done after the somatoform  therapy.  Other possibilities in eliminating the Xanax use included  buspirone or a low-dose serotonin reuptake inhibitor.   When using Xanax, I would be cautious about the risk of dependence and  ataxia.      Antonietta Breach, M.D.  Electronically Signed     JW/MEDQ  D:  01/18/2009  T:  01/18/2009  Job:  [id number]

## 2011-03-22 NOTE — Consult Note (Signed)
NAMEJAYANI, [name]                ACCOUNT NO.:  [id number]   MEDICAL RECORD NO.:  [id number]         PATIENT TYPE:  PAMB   LOCATION:  DAY                           FACILITY:  [facility]   PHYSICIAN:  Kassie Mends, M.D.      DATE OF BIRTH:  [birth year]   DATE OF CONSULTATION:  01/29/2008  DATE OF DISCHARGE:                                 CONSULTATION   REFERRING PHYSICIAN:  Francoise Schaumann. Halm, DO.   PROBLEM LIST:  1. Gastroesophageal reflux disease.  2. Benign fundic gland polyps in December 2008.  3. Chronic gastritis.  4. Nonalcoholic steatohepatitis with an ALT of 57 in June 2008 with      abdominal ultrasound in December 2008 showing fatty liver disease      and negative serologies for hemochromatosis, Wilson disease or      autoimmune hepatitis.  5. Fibromyalgia.  6. Hypertension.  7. Hiatal hernia.  8. Lower leg edema.  9. Hysterectomy at age 50 for fibroids, pain and bleeding.   SUBJECTIVE:  [name] is a 50 year old female who presents as a return  patient visit.  She complains of bad pain on her right side after she  eats.  When she stands up she says it feels hard like a rock on her  right side.  That hardness is worse after she eats.  She denies any  diarrhea.  Her bowel movements are no longer in the morning but now in  the evening.  It is now more of a struggle to have a bowel movement.  After she had her EGD with Bravo placement, she noticed rectal bleeding.  She had rectal bleeding for 3-5 days.  She thought it was related to the  capsule.  She has decreased her intake of Dr. Reino Kent.  She continues to  drink water and apple juice.  She eats ice cream three times a week.  She does not eat any cheese.  She has milk with her cereal but she says  she leaves the milk in the bowl.  She has abdominal pain every day.  When she eats something, cereal or bologna sandwich, it causes her to  have pain.   MEDICATIONS:  1. Cyclobenzaprine.  2. Xanax.  3. Prilosec b.i.d. 30 minutes  before meals.  4. Levothyroxine.  5. Depakote.  6. Depacon.  7. Metoprolol.  8. Lasix.  9. Norco 10/325 mg.   PHYSICAL EXAM:  Weight is 173 pounds (unchanged since December 2008),  height 5 feet 6 inches, BMI 27.8 (overweight).  Temperature 98, blood  pressure 138/90, pulse 74.  GENERAL:  She is in no apparent distress, alert and oriented x4.  LUNGS:  Clear to auscultation bilaterally.  CARDIOVASCULAR:  A regular rhythm  with no murmur.  ABDOMEN:  Bowel sounds are present, soft, nondistended, nontender, no  rebound or guarding.  NEUROLOGIC:  She has no focal neurologic deficits.   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  She had mildly  elevated liver enzymes associated with fatty liver  disease consistent  with nonalcoholic steatohepatitis.   Thank you for allowing me to see [name] in consultation.  My  recommendations follow.  She also was complaining of intermittent rectal  bleeding.  The differential diagnosis includes rectal polyp, colon  cancer, hemorrhoids, and low likelihood of an anal fissure.   RECOMMENDATIONS:  1. She is asked to be on a low-lactose diet.  She may use Digestive      Advantage Lactose Intolerance daily.  She is given samples.  2. She is given a handout on colon and flatus prevention.  3. She may use Levsin one 30 minutes prior to meals.  She is warned      that it may cause dry mouth, constipation, dry eyes, blurred vision      or urinary retention.  4. She is to make sure she drinks 6-8 cups of water daily.  5. We will schedule colonoscopy for rectal bleeding.  She is given her      discharge instructions in writing.  6. Follow-up in 2 months.  If her symptoms persist, would consider a      hydrogen breath test.      Kassie Mends, M.D.  Electronically Signed     SM/MEDQ  D:  01/29/2008  T:  01/30/2008  Job:  [id number]   cc:   Francoise Schaumann. Milford Cage DO, FAAP   Fax: [phone]

## 2011-03-22 NOTE — Discharge Summary (Signed)
NAMEBRYLEA, [name]                ACCOUNT NO.:  [id number]   MEDICAL RECORD NO.:  [id number]          PATIENT TYPE:  INP   LOCATION:  4705                         FACILITY:  [facility]   PHYSICIAN:  Marlan Palau, M.D.  DATE OF BIRTH:  [birth year]   DATE OF ADMISSION:  01/14/2009  DATE OF DISCHARGE:  01/16/2009                               DISCHARGE SUMMARY   ADMISSION DIAGNOSES:  1. Psychogenic stroke with left hemiparesis.  2. Fibromyalgia.  3. Diabetes.  4. Hypertension.  5. Dyslipidemia.   DISCHARGE DIAGNOSES:  1. 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  2. Fibromyalgia.  3. Hypertension.  4. Diabetes.  5. Dyslipidemia.   Procedures during this admission include:  1. CT of the head.  2. MRI of the brain.  3. MRI angiogram of the intracranial vessels.   HISTORY OF PRESENT ILLNESS:  [name] is a 50 year old right-handed  white female born 25-Feb-1961, with a history of migraine headaches.  This patient has been followed by Dr. Vickey Huger.  The patient has had  episodes of left hemiparesis on and off over the last decade with the  last event occurring approximately a year ago.  The patient has always  had negative MRI studies and negative stroke workup.  The patient has  had functional examinations in the past consistent with a psychogenic  stroke.  The patient came in to the hospital as a code stroke on January 14, 2009.  The patient claims that she awoke around 5:30 in the morning  with a left hemiparesis, headache.  The patient was brought to the  emergency room.  Last seen normal at around 3:30 a.m., when went to the  bathroom.  The patient however had a very functional examination in the  emergency room.  The patient was initially mute, but then began to be  able to talk.  CT scan of the head was unremarkable.  The patient was  admitted for further evaluation.   PAST MEDICAL HISTORY:  Significant for:  1. History of  psychogenic stroke/hysterical conversion reaction with      left hemiparesis and speech disturbance.  2. Fibromyalgia.  3. Gastroesophageal reflux disease.  4. Migraine headache.  5. Low back pain.  6. Hypertension.  7. Hypothyroidism.  8. Renal calculi.  9. Bladder resuspension procedure.  10.Gallbladder resection.  11.Hysterectomy.  12.Right carpal tunnel syndrome surgery.  13.Diabetes.  14.Gastritis.  15.Dyslipidemia.   The patient has allergies to PENICILLIN, SULFA DRUGS, and DEMEROL.   She does not smoke or drink.   MEDICATIONS:  On admission include:  1. Metformin 500 mg in the morning.  2. Nortriptyline 25 mg at bedtime.  3. Xanax 0.5 mg twice daily.  4. Metoprolol 100 mg twice daily.  5. Flexeril 10 mg 4 times daily.  6. Depakote 500 mg nightly.  7. Synthroid 0.175 mg daily.  8. Vicodin if needed.  9. Hydrocodone 5 mg if needed.  10.Zocor 20 mg daily.   Laboratory values during this admission are notable for a white count of  4.6,  hemoglobin 13.9, hematocrit 39.3, MCV of 88.1, platelets 193, INR  1.0.  Sodium 139, potassium of 3.4, chloride of 100, CO2 of 27, glucose  156, BUN of 6, creatinine 0.58.  Total bili 0.5, alk phosphatase of 83,  SGOT of 31, SGPT of 51.  Total protein of 7.2, albumin of 3.7, calcium  of 9.3.  Urinalysis reveals specific gravity of 1.013, pH of 6.5, 0-2  red cells and white cells.   HOSPITAL COURSE:  This patient was admitted to [facility].  The  patient was set up for an MRI scan of the brain with an MRI angiogram of  the intracranial vessels.  Clinical evaluation in the emergency room  revealed very functional features of clinical examination with variable  motor effort with the left side splitting midline vibratory sensation on  the left face, total anesthesia of the left arm and left leg.  MRI of  the brain was done shows no evidence of an infarct, acute or chronic.  MRI angiogram of the intracranial vessels were normal.   The patient was  kept on some low-dose aspirin 81 mg daily.  The patient failed her  swallow study in the emergency room and speech therapy eventually saw  her and did another swallow evaluation and was converted to a regular  diet.  The diagnosis of hysterical conversion reaction was explained to  the patient, to the patient's daughters, and to the patient's husband.  The family seems to have some trouble understanding what the actual  problem is, but after prolonged discussion, I believe that they at least  have some understanding.  The patient was set up to see Psychiatry, but  this consult is not yet occurred.  Rehab with physical and occupational  therapy has seen the patient and inpatient rehab was recommended, but I  believe this is counterproductive to treating her underlying problem.  At this point, the patient wishes a second opinion from her primary care  physician and from Dr. Vickey Huger, who apparently has already offered an  opinion concerning nonorganic cause of her problems.  The patient will  be discharged to home.   DISCHARGE MEDICATIONS:  1. Metformin 500 mg daily.  2. Pamelor 25 mg at night.  3. Xanax 0.5 mg one twice daily.  4. Metoprolol 100 mg twice daily.  5. Flexeril 10 mg 4 times daily.  6. Depakote 500 mg at bedtime.  7. Synthroid 0.175 mg daily.  8. Vicodin one every 6 hours if needed.  9. Zocor 20 mg daily.  10.Aspirin 81 mg daily.   The patient will follow with Dr. Vickey Huger and with her primary care  doctor, Dr. Vivia Ewing.   At the time of discharge, the patient remains alert, cooperative, normal  speech, symmetric face, but poor effort noted with the left arm and left  leg.      Marlan Palau, M.D.  Electronically Signed     CKW/MEDQ  D:  01/16/2009  T:  01/16/2009  Job:  [id number]

## 2011-03-25 NOTE — H&P (Signed)
[name], [name]                ACCOUNT NO.:  [id number]   MEDICAL RECORD NO.:  [id number]         PATIENT TYPE:  PAMB   LOCATION:                                FACILITY:  [facility]   PHYSICIAN:  Dalia Heading, M.D.  DATE OF BIRTH:  03-26-1961   DATE OF ADMISSION:  DATE OF DISCHARGE:  LH                                HISTORY & PHYSICAL   CHIEF COMPLAINT:  Chronic cholecystitis.   HISTORY OF PRESENT ILLNESS:  The patient is a 50 year old white female who  is referred for evaluation and treatment biliary colic secondary to chronic  cholecystitis.  She has been having right upper quadrant abdominal pain with  radiation to the right flank, nausea, bloating for many weeks.  She does  have fatty food intolerance.  No fever, chills, jaundice have been noted.   PAST MEDICAL HISTORY:  1.  Includes hypothyroidism.  2.  Hypertension.   PAST SURGICAL HISTORY:  1.  Bladder sling.  2.  Carpal tunnel right hand.  3.  Hysterectomy.  4.  Nephrolithiasis.   CURRENT MEDICATIONS:  1.  Synthroid 0.125 mg p.o. daily.  2.  A blood pressure pill 1 tablet p.o. daily.  3.  Cyclobenzaprine 10 mg p.o. q.8 h. p.r.n. muscle spasms.  4.  Xanax 1 tablet p.o. q.h.s.  5.  Lortab p.r.n. pain.   ALLERGIES:  PENICILLIN, SULFA, DEMEROL.   REVIEW OF SYSTEMS:  The patient denies drinking or smoking.   PHYSICAL EXAMINATION:  GENERAL:  The patient is a well-developed, well-  nourished white female in no acute distress.  HEENT EXAMINATION:  Reveals no scleral icterus.  LUNGS:  Clear to auscultation with equal breath sounds bilaterally.  HEART EXAMINATION:  Reveals a regular rate and rhythm without S3-S4 or  murmurs.  ABDOMEN:  Soft and nondistended.  She is tender in the right upper quadrant  to palpation.  No hepatosplenomegaly, masses, hernias are identified.  Ultrasound of the gallbladder reveals no stones.   HEPATOBILIARY SCAN:  Reveals chronic cholecystitis with a low gallbladder  ejection fraction  reproducible symptoms.   IMPRESSION:  Chronic cholecystitis.   PLAN:  The patient is scheduled for laparoscopic cholecystectomy on  04/14/2006.  The risks and benefits of the procedure including bleeding,  infection, hepatobiliary injury, and the possibility of an open procedure  were fully explained to the patient, who gave informed consent.      Dalia Heading, M.D.  Electronically Signed     MAJ/MEDQ  D:  04/11/2006  T:  04/11/2006  Job:  [id number]   cc:   [name]  Fax: [phone]   Francoise Schaumann. Milford Cage DO, FAAP  Fax: [phone]

## 2011-03-25 NOTE — Consult Note (Signed)
[facility]  Patient:    [name], [name]                       MRN: [id number] Proc. Date: 02/12/01 Adm. Date:  [id number] Attending:  Devoria Albe                          Consultation Report  DATE OF BIRTH:  [birth year]  CHIEF COMPLAINT:  Left-sided pressure and weakness.  REASON FOR CONSULTATION:  [name] is a 50 year old woman whom I have seen in the emergency room at 8:44 a.m. this morning.  I was asked to see her by emergency department physician Dr. Devoria Albe for evaluation of possible left-sided weakness.  HISTORY OF THE PRESENT CONDITION:  The patient complained of pressure in the left side of her body that began last night that awakened her from sleep.  The pressure sensation was present in her face, arm and legs, and neck.  The patient felt that she could not move her left side as well because of the pain and dysfunction.  She also complained that the left side "did not feel right."  The patient had a TIA/RIND like event very similar to this in December 2001 and was admitted to [facility] for evaluation and observation.  We have the MRI scan that was carried out at that time which showed two punctate lesions in the parietal superficial centrum semiovale without evidence of acute changes in the diffusion-weighted imaging, the T2 or T1 films.  Patients vessels appeared to be normal at that time.  The patient was seen by Dr. Jacki Cones at [facility] and no further treatment was instituted.  In December the patient had lumbar puncture, CT scan of the head, MRI of the brain.  As best I know, no abnormalities were seen.  PAST MEDICAL HISTORY: 1. Hypertension of several years duration. 2. Fibromyalgia of four years duration. 3. Migraine headaches of many years duration. 4. Hypothyroidism. 5. Hypercholesterolemia. 6. Lumbar disk disease.  PAST SURGICAL HISTORY:  PTCA in 2000, bladder surgery in 1995,  hysterectomy in 1993.  MEDICATIONS: 1. Cardizem - unknown dose. 2. Synthroid 0.125 mg q.d. 3. Paxil 20 mg q.d. 4. Enteric-coated aspirin 325 mg q.d.  ALLERGIES:  PENICILLIN, DEMEROL, and SULFA - all which cause rash.  REVIEW OF SYSTEMS:  HEENT:  No signs of infection.  CARDIOVASCULAR:  The patient had atypical chest pain.  PTCA was negative.  LUNGS:  No TB, asthma, bronchitis, pneumonia.  ABDOMINAL:   Positive for gastroesophageal reflux disease.  No bleeds, no nausea or vomiting.  MUSCULOSKELETAL:  Chronic pain disorder.  EXTREMITIES:  Positive for claudication and leg cramps.  ENDOCRINE: See above.  Review of systems is otherwise negative.  FAMILY HISTORY:  Mother died age 84 of myocardial infarction, insulin-dependent diabetes mellitus, cerebrovascular accident.  Father is living at age 73, no medical problems.  Maternal grandmother had diabetes mellitus and coronary artery disease.  Paternal grandmother had multiple sclerosis.  SOCIAL HISTORY:  The patient quit smoking about 20 years ago.  She does not use alcohol.  She was disabled four years ago because of her pain syndrome.  PHYSICAL EXAMINATION TODAY:  GENERAL:  This is a pleasant woman, sitting in bed, in no acute distress.  VITAL SIGNS:  Blood pressure 169/93, resting pulse 88, respirations 16, temperature was not measured.  HEENT:  No signs of infection.  Supple  neck, full range of motion.  No cranial or cervical bruits.  There is some tenderness on the left side but nothing that localizes well.  LUNGS:  Clear to auscultation.  HEART:  No murmurs.  Pulses normal.  ABDOMEN:  Soft, nontender.  Bowel sounds normal.  EXTREMITIES:  Were well formed without edema, cyanosis, alterations in tone, or decreased range of motion.  NEUROLOGIC:  Mental status:  Patient was awake, alert, attentive, appropriate. No dysphasia or dyspraxia.  Cranial nerve examination:  Round, reactive pupils, normal fundi.  Full visual  fields to double simultaneous stimuli. Extraocular movements full and conjugate.  OKN response equally bilaterally. Symmetric facial strength and sensation.  Air condition greater than bone condition bilateral.  Motor examination:  Normal strength, tone, and mass. Good fine motor movements.  No pronator drift.  Patient did not show giveaway strength for me but did for Shanna Cisco, a medical student examining her before me.  Fine motor movements appear to be clumsy on the left side; however, when I asked her to examine an object for stereoagnosis she showed ______ movements; there was no pronator drift.  Sensation shows hypesthesia on the left side that splits the midline.  Patient had good stereoagnosis. She sensed difference in the tuning fork with the right side of the head being greater than the left.  Proprioception and vibration were roughly equal. Two-point discrimination was 4 mm in the right hand, 5 mm in the left hand. Gait was antalgic.  She had an awkward movement of the left side but she could push up on her toes and her heels with encouragement.  Romberg response was negative.  Deep tendon reflexes were symmetric and normal.  Patient had bilateral flexor plantar responses.  IMPRESSION:  Subjective numbness, left side.  Gait disorder, 781.2.  I do not believe that the patient has had an acute cerebrovascular accident. In order to screen for this we will perform an MRI of the brain without contrast and MR angiography.  If negative, we will discharge her home and have her seen by her primary physician in follow-up.  She should continue to take the current medications that she has.  LABORATORY DATA: I have reviewed her CT scan personally and it is normal.  I have reviewed the MRI scan and it is as I described above.  Her laboratory shows sodium 142, potassium 4.0, chloride 100, CO2 26, BUN 6, glucose 95.  Hemoglobin 13, hematocrit 39.  A pH was 7.37, bicarbonate 25. DD:   02/12/01 TD:  02/12/01 Job: [id number]

## 2011-03-25 NOTE — H&P (Signed)
[name], PIXLEY                ACCOUNT NO.:  [id number]   MEDICAL RECORD NO.:  [id number]          PATIENT TYPE:  INP   LOCATION:  A307                          FACILITY:  [facility]   PHYSICIAN:  Jeoffrey Massed, MD  DATE OF BIRTH:  March 06, 1961   DATE OF ADMISSION:  09/13/2004  DATE OF DISCHARGE:  LH                                HISTORY & PHYSICAL   CHIEF COMPLAINT:  Abdominal pain.   HISTORY OF PRESENT ILLNESS:  [name] is a 50 year old white female with  fibromyalgia, hypertension, hypothyroidism, and hyperlipidemia, who presents  today with a 2-1/2-day history of left lower abdominal and flank pain.  It  initially was intermittent and has changed now to constant and severe and is  making her double over.  She has nausea constantly and occasionally throws  up nonbilious, nonbloody emesis.  She has no diarrhea.  Last bowel movement  was yesterday and was normal.  There has been no blood or mucus in her  stool.  She says her urine smells but denies any blood in it.  She does say  that it burns a little to pee.  Reports subjective fever but none  documented.  No sick contacts.  Of note, the patient remarks that she had a  past problem with a ruptured ovarian cyst, which was detected by a CT scan  approximately one year ago.  At that time it was also incidentally found  that she had a stone sitting in the pelvis of her left kidney.   PAST MEDICAL HISTORY:  1.  Fibromyalgia, requiring chronic narcotic use.  2.  Hyperlipidemia.  3.  Hypertension.  4.  Hypothyroidism.  5.  Hysterectomy in 1993.  6.  Bladder sling procedure in 1993.  7.  Laparoscopy in the year prior to her hysterectomy.   MEDICATIONS:  1.  Flexeril 20 mg t.i.d.  2.  Crestor 10 mg daily.  3.  Enalapril 5 mg twice daily.  4.  Norco 10/325 mg one tablet q.i.d. for pain.  5.  Levoxyl 125 mcg daily.  6.  Neurontin 800 mg q.i.d.  7.  Nexium 40 mg daily.  8.  Xanax 0.25 mg p.o. q.h.s.   ALLERGIES:  PENICILLIN and  SULFA cause a rash.  DEMEROL makes her swell and  causes a rash.  Of note, she has tolerated morphine and Dilaudid in the past  without problem.   SOCIAL HISTORY:  Lives in [place] with her husband and her two children.  She is disabled from her fibromyalgia.  She does not smoke or drink or use  any illicit drugs.   FAMILY HISTORY:  History of coronary artery disease in the mid-30s in her  sister and brother.   REVIEW OF SYSTEMS:  See HPI.  Additionally, no headaches, no cough, no sore  throat, no joint or muscle pain.  She does have chronic neck and back pain.  Mildly decreased urine output.   PHYSICAL EXAMINATION:  VITAL SIGNS:  Temperature 97.3 tympanic, pulse 90-  110, blood pressure 142/86, respirations 18.  GENERAL:  She is alert and oriented x4.  She is doubled over on the exam  table but in no distress.  HEENT:  There is no scleral icterus or injection.  Pupils equal, round, and  reactive to light and accommodation.  Her tympanic membranes are clear  bilaterally.  Her nasal passages are clear, and her oropharynx reveals pink,  moist mucosa without lesion.  NECK:  Supple without lymphadenopathy or thyromegaly.  CHEST:  Her lungs are clear to auscultation bilaterally, difficulty taking  deep breaths secondary to pain.  CARDIOVASCULAR:  Mildly tachycardic without murmur.  Rhythm is regular.  ABDOMEN:  Soft with diffuse lower abdominal tenderness, left significantly  worse than the right, with positive rebound tenderness.  There is no mass or  hepatosplenomegaly.  Bowel sounds are slightly decreased.  There is also  tenderness in the left flank area but no significant CVA tenderness.  EXTREMITIES:  No edema, no cyanosis, no clubbing.  There is no rash.   LABORATORY DATA:  A urinalysis shows trace nonhemolyzed blood, otherwise  normal.   ASSESSMENT AND PLAN:  Abdominal pain and vomiting.  Most likely diagnosis at  this point is renal colic from an obstructing stone.   However, urinalysis  today was not quite as convincing as I would like to see.  Additionally, I  cannot find the previously-mentioned CT scan the patient says that she had  showing a stone.  Will admit to the hospital to further clarify diagnosis,  treat with IV pain medicines, and observe.  We will continue all present  outpatient medications.     Phil   PHM/MEDQ  D:  09/13/2004  T:  09/13/2004  Job:  [id number]

## 2011-03-25 NOTE — Procedures (Signed)
   NAMEGHINA, BITTINGER                            ACCOUNT NO.:  [id number]   MEDICAL RECORD NO.:  [id number]                     PATIENT TYPE:   LOCATION:                                       FACILITY:   PHYSICIAN:  Scott A. Gerda Diss, M.D.               DATE OF BIRTH:   DATE OF PROCEDURE:  DATE OF DISCHARGE:                                EKG INTERPRETATION   INTERPRETATION:  1. Normal sinus rhythm with slight T wave inferior abnormalities.  2. Slight ST segment elevation in V4-V6, otherwise normal EKG.                                               Scott A. Gerda Diss, M.D.    Linus Orn  D:  06/09/2002  T:  06/15/2002  Job:  [id number]

## 2011-03-25 NOTE — H&P (Signed)
[facility]  Patient:    [name], [name] Visit Number: [id number] MRN: [id number]          Service Type: MED Location: 2A A214 01 Attending Physician:  Cassell Smiles. Dictated by:   Lilyan Punt, M.D. Admit Date:  03/03/2002                           History and Physical  DATE OF BIRTH:  Apr 10, 1961  CHIEF COMPLAINT:  Left arm weakness, slight left leg weakness, severe headache.  HISTORY OF PRESENT ILLNESS:  This 50 year old white female awoke during the night with a throbbing headache on the right side of her head.  She felt like her head was exploding and then it would ease up and then explode again.  She related nausea with it but no vomiting.  She related then she woke up around 6:30 a.m. today and her left arm was sluggish.  She could move it but the strength was significantly diminished, and slightly diminished in the left leg.  She states she had a stroke-like symptomatology in 2000 and it was not really determined why, according to her.  She denies any substernal chest pressure but does relate sharp chest pains intermittently over the past 24 hours.  Also relates intermittent indigestion symptoms with nausea.  No diarrhea, no dysuria.  No fever or chills.  She does relate feeling a little sluggish the past 24 hours.  She denies any blurred vision.  PAST MEDICAL HISTORY:  1. Fibromyalgia.  2. GERD.  3. Migraines.  4. Lumbar back pain.  5. Hypothyroidism.  6. HTN.  7. CVA in 2000.  MEDICATIONS:  1. Tylox p.r.n.  2. Synthroid, unknown dosage.  3. Avapro, unknown dosage.  4. Tylox p.r.n.  ALLERGIES:  1. PENICILLIN.  2. DEMEROL.  3. SULFA.  REVIEW OF SYSTEMS:  See per above.  FAMILY HISTORY:  Noncontributory.  SOCIAL HISTORY:  Lives with her husband.  PHYSICAL EXAMINATION:  GENERAL:  NAD.  Looks to feel bad.  HEENT:  Benign.  NECK:  Supple.  CHEST:  CTA.  Chest wall tender to the touch.  HEART:   Regular.  ABDOMEN:  Soft.  No guarding or rebound.  EXTREMITIES:  No edema.  Unable to check reflexes because of IV placement in the left arm.  Ability to lift arm, bend at the biceps, and spread the fingers is diminished, probably a 3/5.  The left leg is approximately a 4-4+/5, right side is 5/5.  Neurosensory otherwise normal.  LABORATORY DATA:  CT is negative.  Laboratory work negative.  EKG negative.  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  PLAN:  1. Should resolve over the course of the next 24-48 hours.  If it does     not resolve over the next 24-48 hours then she may well need to have an     MRI along with Doppler studies.  2. Will place her on aspirin.  3. Do not know her usual dosages so therefore her primary care doctor     tomorrow will get that in place, but otherwise will order the other     medication per usual protocol and place her on 2-A CC/NT. Dictated by:   Lilyan Punt, M.D. Attending Physician:  Cassell Smiles DD:  03/03/02 TD:  03/04/02 Job: [id number]

## 2011-03-25 NOTE — Op Note (Signed)
[name], [name]                ACCOUNT NO.:  [id number]   MEDICAL RECORD NO.:  [id number]          PATIENT TYPE:  AMB   LOCATION:  DAY                           FACILITY:  [facility]   PHYSICIAN:  Dalia Heading, M.D.  DATE OF BIRTH:  Mar 04, 1961   DATE OF PROCEDURE:  04/14/2006  DATE OF DISCHARGE:                                 OPERATIVE REPORT   PREOPERATIVE DIAGNOSIS:  Chronic cholecystitis.   POSTOPERATIVE DIAGNOSIS:  Chronic cholecystitis.   PROCEDURE:  Laparoscopic cholecystectomy.   SURGEON:  Dalia Heading, M.D.   ASSISTANT:   ANESTHESIA:  General endotracheal.   INDICATIONS:  The patient is a 50 year old white female who presents with  biliary colic secondary to chronic cholecystitis.  The risks and benefits of  the procedure including bleeding, infection, hepatobiliary injury, and the  possibly an open procedure were fully explained to the patient who gave  informed consent.   PROCEDURE NOTE:  The patient was placed in the supine position.  After  induction of general endotracheal anesthesia, the abdomen was prepped and  draped in the usual sterile technique with Betadine.  Surgical site  confirmation was performed.  An infraumbilical incision was made down to the  fascia.  The Veress needle was introduced into the abdominal cavity and  confirmation of placement was done using the saline drop test.  The abdomen  was then insufflated to 16 mmHg pressure.  An 11 mm trocar was introduced  into the abdominal cavity under direct visualization without difficulty.  Additional 11 mm trocar was placed in the epigastric region, 5 mm trocars  were placed in the right lower quadrant and right flank regions.  The liver  was inspected and noted to be within normal limits.  The gallbladder was  retracted superior and laterally.  The dissection was begun around the  infundibulum of the gallbladder.  The cystic duct was first identified.  Its  juncture to the infundibulum was fully  identified.  Endoclips were placed  proximally and distally on the cystic duct and the cystic duct was divided.  This was likewise done to the cystic artery.  The gallbladder then freed  away from the gallbladder fossa using Bovie electrocautery.  The gallbladder  was delivered through the epigastric trocar site using an EndoCatch bag.  The gallbladder fossa was inspected and no abnormal bleeding or bile leakage  was noted.  Surgicel was placed in the gallbladder fossa.  All fluid and air  was then evacuated from the abdominal cavity prior to removal of the  trocars.  All wounds were irrigated with normal saline.  All wounds were  injected with 0.5% Sensorcaine.  The infraumbilical fascia was  reapproximated using an 0 Vicryl interrupted suture.  All skin incisions  were closed using staples.  Betadine ointment and dry sterile dressings were  applied.  All tape and needle counts were correct at the end of the  procedure.  The patient was extubated in the operating room and went back to  the recovery room awake in stable condition.   COMPLICATIONS:  None.   SPECIMEN:  Gallbladder.   BLOOD LOSS:  Minimal.      Dalia Heading, M.D.  Electronically Signed     MAJ/MEDQ  D:  04/14/2006  T:  04/14/2006  Job:  [id number]   cc:   Debbora Dus, M.D.

## 2011-03-25 NOTE — Discharge Summary (Signed)
NAMESEVYN, [name]                ACCOUNT NO.:  [id number]   MEDICAL RECORD NO.:  [id number]          PATIENT TYPE:  INP   LOCATION:  A307                          FACILITY:  [facility]   PHYSICIAN:  Jeoffrey Massed, MD  DATE OF BIRTH:  [birth year]   DATE OF ADMISSION:  09/13/2004  DATE OF DISCHARGE:  11/10/2005LH                                 DISCHARGE SUMMARY   ADMISSION DIAGNOSES:  1.  Abdominal and flank pain.  2. Vomiting.  3. Renal calculi,      nonobstructive  4. Nonbloody diarrhea.   DISCHARGE DIAGNOSES:  Same.   DISCHARGE MEDICATIONS:  1.  Reglan 10 mg by mouth before each meal and at bedtime.  2. She was to      resume her home medications which are Flexeril 20 mg t.i.d.  3. Crestor      10 mg daily.  4. Enalapril 5 mg b.i.d.  5. Norco 10/325, 1 tablet q.i.d.      for pain.  6. Levoxyl 125 mcg daily.  7. Neurontin 800 mg 4 times a day.      8. Nexium 40 mg daily.  9. Xanax 0.25 mg q.h.s.   CONSULTATIONS:  None.   PROCEDURES:  1.  On September 13, 2004, the patient had a noncontrast abdominal CT which      showed two small, nonobstructing calculi in the left kidney.  Otherwise,      no calculi.  No hydronephrosis.  Each ovary had a small simple cyst.      Appendix appeared normal.   HISTORY AND PHYSICAL:  For complete history and physical, please see  dictated H&P in the chart.  Briefly, this is a 49 year old white female with  fibromyalgia and chronic pain syndrome who presented with about a two-day  history of worsening left lower abdominal and flank pain.  She had  persistent nausea and vomiting and was admitted for intractable pain and  nausea and vomiting.   HOSPITAL COURSE:  Problem 1. Abdominal/flank pain.  Shannelle was admitted to 3A  and placed on Dilaudid for pain control and Phenergan for nausea.  She was  placed on a full liquid diet and set up for an abdominal and pelvic  noncontrast CT to evaluate her abdominal pain and flank pain further.  This  did not  find any likely cause for her severe pain, although there were some  minor findings as dictated in the procedure section.  She continued to have  significant left lower quadrant pain throughout the next one to two days and  then developed some nonbloody diarrhea type stool.  Stool culture was done  and showed no growth.  Urinalysis was normal.  Stool is negative for occult  blood.  C. diff toxin  was negative.  Complete metabolic panel was  significant only for mildly elevated glucose at 117 to 125.  Albumin was  also mildly low at 3.3, as was total protein at 5.9.  Complete blood count  was normal.   The patient's pain gradually improved, and she was taken off of her IV pain  medicine, and her diet was advanced.  She then stated that she thought she  passed a very small stone, but this was not seen by the doctors or the  nurses.  A repeat urinalysis showed no blood.  She was started on Reglan and  continued to improve.  She was discharged home  in improved condition on the previously mentioned discharge medications.  It  is unclear what her abdominal and flank pain was due to at this time.  She  was instructed to arrange followup with [facility] in the  next two weeks.  She was also given a flu shot on the day of discharge.     Phil   PHM/MEDQ  D:  10/04/2004  T:  10/04/2004  Job:  [id number]

## 2011-03-25 NOTE — Discharge Summary (Signed)
[facility]  Patient:    [name], [name] Visit Number: [id number] MRN: [id number]          Service Type: OUT Location: RAD Attending Physician:  Ara Kussmaul Dictated by:   Vivia Ewing, D.O. Admit Date:  03/11/2002 Discharge Date: 03/11/2002                             Discharge Summary  DISCHARGE DIAGNOSES: 1. Peripheral neuropathy, motor and sensory. 2. Chronic pain syndrome. 3. History of fibromyalgia.  HISTORY OF PRESENT ILLNESS:  The patient presented as a 50 year old female with acute onset of headache associated with left arm pain as well as paresis. She noted symptoms to her chest as well as her left lower extremity.  She had a CT scan in the emergency room which showed nothing acute as far as bleeding. Her admission chest x-ray also showed no acute cardiopulmonary findings.  ASSESSMENT/PLAN:  She was admitted to the hospital with symptoms of neurologic migraine or partial paralysis secondary to migraine.  HOSPITAL COURSE:  The patient was observed in the hospital for anticipated resolution of her symptoms.  She had no significant improvement in her left-sided symptoms, in particular, her left upper extremity.  She had progressive mild left edema which may have been secondary to an IV and phlebotomy.  Her headaches resolved as well as her chest pain, but she persisted to have some left upper extremity discomfort requiring IV narcotics. A myocardial infarction was ruled out via enzymes as well as EKGs.  Physical therapy and occupational therapy were consulted and recommended outpatient OT at the time of discharge.  Arrangements were made for this.  CONDITION ON DISCHARGE:  Discharged in stable condition with stable vital signs.  She had persistent pain and weakness in her left upper extremity.  The etiology of this remains unclear.  I will reevaluate the patient as an outpatient and consider repeating her MRI scan, although she has had  numerous MRIs in the past.  It is possible that this is a flare of a multiple sclerosis episode, although this has never been confirmed as a diagnosis for this patient.  DISCHARGE MEDICATIONS: 1. Tylox one q.6h. p.r.n. pain. 2. Synthroid at previous dose. 3. Avapro at previous dose.  FOLLOWUP:  Follow up in Dr. Kerin Ransom office in one week on Mar 13, 2002.  We will also schedule her for an MRI of her brain and cervical spine on May 5, to rule out any kind of cervical mass or spinal tumor. Dictated by:   Vivia Ewing, D.O.  Attending Physician:  Ara Kussmaul DD:  03/20/02 TD:  03/21/02 Job: [id number]

## 2011-03-27 ENCOUNTER — Emergency Department (HOSPITAL_COMMUNITY)
Admission: EM | Admit: 2011-03-27 | Discharge: 2011-03-27 | Disposition: A | Discharge status: Home / Self Care | Payer: 59 | Attending: Emergency Medicine | Admitting: Emergency Medicine

## 2011-03-27 DIAGNOSIS — M545 Low back pain, unspecified: Secondary | ICD-10-CM | POA: Insufficient documentation

## 2011-03-27 DIAGNOSIS — E119 Type 2 diabetes mellitus without complications: Secondary | ICD-10-CM | POA: Insufficient documentation

## 2011-03-27 DIAGNOSIS — I10 Essential (primary) hypertension: Secondary | ICD-10-CM | POA: Insufficient documentation

## 2011-03-27 DIAGNOSIS — Z79899 Other long term (current) drug therapy: Secondary | ICD-10-CM | POA: Insufficient documentation

## 2011-03-27 LAB — URINALYSIS, ROUTINE W REFLEX MICROSCOPIC
Hgb urine dipstick: NEGATIVE
Nitrite: NEGATIVE
Specific Gravity, Urine: >1.03 — ABNORMAL HIGH (ref 1.005–1.030)
Urobilinogen, UA: 0.2 mg/dL (ref 0.0–1.0)

## 2011-03-29 LAB — URINE CULTURE

## 2011-03-30 ENCOUNTER — Other Ambulatory Visit (HOSPITAL_COMMUNITY): Payer: Self-pay | Admitting: Pediatrics

## 2011-03-30 DIAGNOSIS — N200 Calculus of kidney: Secondary | ICD-10-CM

## 2011-04-01 ENCOUNTER — Ambulatory Visit (HOSPITAL_COMMUNITY)
Admission: RE | Admit: 2011-04-01 | Discharge: 2011-04-01 | Disposition: A | Discharge status: Home / Self Care | Payer: 59 | Source: Ambulatory Visit | Attending: Pediatrics | Admitting: Pediatrics

## 2011-04-01 DIAGNOSIS — N200 Calculus of kidney: Secondary | ICD-10-CM | POA: Insufficient documentation

## 2011-04-01 DIAGNOSIS — R1032 Left lower quadrant pain: Secondary | ICD-10-CM | POA: Insufficient documentation

## 2011-04-01 DIAGNOSIS — K5730 Diverticulosis of large intestine without perforation or abscess without bleeding: Secondary | ICD-10-CM | POA: Insufficient documentation

## 2011-04-01 DIAGNOSIS — R319 Hematuria, unspecified: Secondary | ICD-10-CM | POA: Insufficient documentation

## 2011-04-08 ENCOUNTER — Ambulatory Visit (INDEPENDENT_AMBULATORY_CARE_PROVIDER_SITE_OTHER): Payer: 59 | Admitting: Urology

## 2011-04-08 DIAGNOSIS — M545 Low back pain, unspecified: Secondary | ICD-10-CM

## 2011-04-08 DIAGNOSIS — R109 Unspecified abdominal pain: Secondary | ICD-10-CM

## 2011-04-08 DIAGNOSIS — N200 Calculus of kidney: Secondary | ICD-10-CM

## 2011-05-17 ENCOUNTER — Emergency Department (HOSPITAL_COMMUNITY)
Admission: EM | Admit: 2011-05-17 | Discharge: 2011-05-17 | Disposition: A | Discharge status: Home / Self Care | Payer: 59 | Attending: Emergency Medicine | Admitting: Emergency Medicine

## 2011-05-17 ENCOUNTER — Emergency Department (HOSPITAL_COMMUNITY): Payer: 59

## 2011-05-17 DIAGNOSIS — R109 Unspecified abdominal pain: Secondary | ICD-10-CM | POA: Insufficient documentation

## 2011-05-17 DIAGNOSIS — E039 Hypothyroidism, unspecified: Secondary | ICD-10-CM | POA: Insufficient documentation

## 2011-05-17 DIAGNOSIS — E119 Type 2 diabetes mellitus without complications: Secondary | ICD-10-CM | POA: Insufficient documentation

## 2011-05-17 DIAGNOSIS — Z79899 Other long term (current) drug therapy: Secondary | ICD-10-CM | POA: Insufficient documentation

## 2011-05-17 DIAGNOSIS — N390 Urinary tract infection, site not specified: Secondary | ICD-10-CM | POA: Insufficient documentation

## 2011-05-17 DIAGNOSIS — I10 Essential (primary) hypertension: Secondary | ICD-10-CM | POA: Insufficient documentation

## 2011-05-17 DIAGNOSIS — M545 Low back pain, unspecified: Secondary | ICD-10-CM | POA: Insufficient documentation

## 2011-05-17 LAB — BASIC METABOLIC PANEL
CO2: 35 mEq/L — ABNORMAL HIGH (ref 19–32)
Calcium: 9.9 mg/dL (ref 8.4–10.5)
GFR calc non Af Amer: >60 mL/min (ref >60)
Glucose, Bld: 180 mg/dL — ABNORMAL HIGH (ref 70–99)
Potassium: 3.6 mEq/L (ref 3.5–5.1)
Sodium: 140 mEq/L (ref 135–145)

## 2011-05-17 LAB — DIFFERENTIAL
Eosinophils Relative: 4 % (ref 0–5)
Lymphocytes Relative: 40 % (ref 12–46)
Lymphs Abs: 2.2 10*3/uL (ref 0.7–4.0)
Monocytes Absolute: 0.4 10*3/uL (ref 0.1–1.0)
Monocytes Relative: 7 % (ref 3–12)
Neutro Abs: 2.6 10*3/uL (ref 1.7–7.7)

## 2011-05-17 LAB — CBC
HCT: 39.3 % (ref 36.0–46.0)
Hemoglobin: 12.7 g/dL (ref 12.0–15.0)
MCHC: 32.3 g/dL (ref 30.0–36.0)
MCV: 86.4 fL (ref 78.0–100.0)
RDW: 14.1 % (ref 11.5–15.5)

## 2011-05-17 LAB — URINALYSIS, ROUTINE W REFLEX MICROSCOPIC
Bilirubin Urine: NEGATIVE
Glucose, UA: NEGATIVE mg/dL
Protein, ur: NEGATIVE mg/dL
Urobilinogen, UA: 0.2 mg/dL (ref 0.0–1.0)

## 2011-05-17 LAB — URINE MICROSCOPIC-ADD ON

## 2011-05-19 LAB — URINE CULTURE: Culture  Setup Time: 201207101443

## 2011-07-29 ENCOUNTER — Ambulatory Visit: Payer: 59 | Admitting: Urgent Care

## 2011-08-12 ENCOUNTER — Ambulatory Visit: Payer: 59 | Admitting: Urgent Care

## 2011-08-26 ENCOUNTER — Telehealth: Payer: Self-pay | Admitting: Urgent Care

## 2011-08-26 ENCOUNTER — Ambulatory Visit: Payer: 59 | Admitting: Urgent Care

## 2011-08-26 NOTE — Telephone Encounter (Signed)
Noted. Pt cancelled/NS x3.

## 2011-08-26 NOTE — Telephone Encounter (Signed)
Pt was a no show

## 2011-10-13 ENCOUNTER — Encounter: Payer: Self-pay | Admitting: Gastroenterology

## 2011-10-14 ENCOUNTER — Ambulatory Visit: Payer: 59 | Admitting: Urgent Care

## 2011-10-19 ENCOUNTER — Ambulatory Visit (HOSPITAL_COMMUNITY)
Admission: RE | Admit: 2011-10-19 | Discharge: 2011-10-19 | Disposition: A | Discharge status: Home / Self Care | Payer: 59 | Source: Ambulatory Visit | Attending: Pediatrics | Admitting: Pediatrics

## 2011-10-19 ENCOUNTER — Other Ambulatory Visit (HOSPITAL_COMMUNITY): Payer: Self-pay | Admitting: Pediatrics

## 2011-10-19 DIAGNOSIS — R053 Chronic cough: Secondary | ICD-10-CM

## 2011-10-19 DIAGNOSIS — R05 Cough: Secondary | ICD-10-CM | POA: Insufficient documentation

## 2011-10-19 DIAGNOSIS — R0989 Other specified symptoms and signs involving the circulatory and respiratory systems: Secondary | ICD-10-CM | POA: Insufficient documentation

## 2011-10-19 DIAGNOSIS — R059 Cough, unspecified: Secondary | ICD-10-CM | POA: Insufficient documentation

## 2011-10-24 ENCOUNTER — Ambulatory Visit: Payer: 59 | Admitting: Urgent Care

## 2011-11-18 ENCOUNTER — Ambulatory Visit: Payer: 59 | Admitting: Urgent Care

## 2011-12-05 ENCOUNTER — Ambulatory Visit: Payer: 59 | Admitting: Gastroenterology

## 2011-12-21 ENCOUNTER — Ambulatory Visit: Payer: 59 | Admitting: Gastroenterology

## 2012-05-07 ENCOUNTER — Encounter: Payer: 59 | Admitting: Obstetrics and Gynecology

## 2012-06-08 ENCOUNTER — Encounter: Payer: 59 | Admitting: Obstetrics and Gynecology

## 2012-06-18 ENCOUNTER — Encounter: Payer: Self-pay | Admitting: Obstetrics and Gynecology

## 2012-07-11 ENCOUNTER — Encounter: Payer: Self-pay | Admitting: Obstetrics and Gynecology

## 2012-08-06 ENCOUNTER — Encounter (HOSPITAL_COMMUNITY): Payer: Self-pay | Admitting: *Deleted

## 2012-08-06 ENCOUNTER — Emergency Department (HOSPITAL_COMMUNITY)
Admission: EM | Admit: 2012-08-06 | Discharge: 2012-08-06 | Disposition: A | Discharge status: Home / Self Care | Payer: 59 | Attending: Emergency Medicine | Admitting: Emergency Medicine

## 2012-08-06 ENCOUNTER — Emergency Department (HOSPITAL_COMMUNITY): Payer: 59

## 2012-08-06 DIAGNOSIS — F17200 Nicotine dependence, unspecified, uncomplicated: Secondary | ICD-10-CM | POA: Insufficient documentation

## 2012-08-06 DIAGNOSIS — Z79899 Other long term (current) drug therapy: Secondary | ICD-10-CM | POA: Insufficient documentation

## 2012-08-06 DIAGNOSIS — R059 Cough, unspecified: Secondary | ICD-10-CM

## 2012-08-06 DIAGNOSIS — K219 Gastro-esophageal reflux disease without esophagitis: Secondary | ICD-10-CM | POA: Insufficient documentation

## 2012-08-06 DIAGNOSIS — E119 Type 2 diabetes mellitus without complications: Secondary | ICD-10-CM | POA: Insufficient documentation

## 2012-08-06 DIAGNOSIS — G35 Multiple sclerosis: Secondary | ICD-10-CM | POA: Insufficient documentation

## 2012-08-06 DIAGNOSIS — IMO0002 Reserved for concepts with insufficient information to code with codable children: Secondary | ICD-10-CM | POA: Insufficient documentation

## 2012-08-06 DIAGNOSIS — I10 Essential (primary) hypertension: Secondary | ICD-10-CM | POA: Insufficient documentation

## 2012-08-06 DIAGNOSIS — R05 Cough: Secondary | ICD-10-CM

## 2012-08-06 DIAGNOSIS — J45909 Unspecified asthma, uncomplicated: Secondary | ICD-10-CM | POA: Insufficient documentation

## 2012-08-06 DIAGNOSIS — B86 Scabies: Secondary | ICD-10-CM

## 2012-08-06 HISTORY — DX: Reserved for concepts with insufficient information to code with codable children: IMO0002

## 2012-08-06 HISTORY — DX: Migraine, unspecified, not intractable, without status migrainosus: G43.909

## 2012-08-06 HISTORY — DX: Unspecified asthma, uncomplicated: J45.909

## 2012-08-06 HISTORY — DX: Polyneuropathy, unspecified: G62.9

## 2012-08-06 HISTORY — DX: Multiple sclerosis: G35

## 2012-08-06 MED ORDER — PREDNISONE 10 MG PO TABS: ORAL_TABLET | ORAL | Status: DC

## 2012-08-06 MED ORDER — HYDROXYZINE HCL 25 MG PO TABS
25.0000 mg | ORAL_TABLET | Freq: Once | ORAL | Status: AC
  Administered 2012-08-06: 25 mg via ORAL
  Filled 2012-08-06: qty 1

## 2012-08-06 MED ORDER — AZITHROMYCIN 250 MG PO TABS: ORAL_TABLET | ORAL | Status: DC

## 2012-08-06 MED ORDER — PERMETHRIN 5 % EX CREA: TOPICAL_CREAM | CUTANEOUS | Status: DC

## 2012-08-06 NOTE — ED Notes (Signed)
Pt with cough(productive of green phlegm)/congestion x 2 weeks, since spending night at hotel in [place]. [place], pt with red areas to elbows, knees and ankles, states husband also with her does not have any areas, nausea x 2 since yesterday, denies fever or vomiting

## 2012-08-06 NOTE — ED Notes (Signed)
Cough ,congestin for 2 weeks,, green sputum.  . Rash , onset Saturday,

## 2012-08-08 NOTE — ED Provider Notes (Signed)
History     [name]: [id number]  Arrival date & time 08/06/12  1254   First MD Initiated Contact with Patient 08/06/12 1318      Chief Complaint  Patient presents with  . Cough    (Consider location/radiation/quality/duration/timing/severity/associated sxs/prior treatment) HPI Comments: Patient c/o persistent productive cough for 2 weeks.  States the cough is productive of green colored sputum.  Has been using an inhaler w/o significant improvement.  She also c/o rash for several days that began after arriving home from vacation.  Stayed in a hotel for 2 days.  States the rash is on her trunk and lower extremities.  Also c/o itching.  She denies difficulty breathing, chest pain, shortness of breath ,  vomiting or fever.    Patient is a 51 y.o. female presenting with cough. The history is provided by the patient.  Cough This is a new problem. The current episode started more than 1 week ago. The problem occurs every few minutes. The problem has not changed since onset.The cough is productive of purulent sputum. There has been no fever. Associated symptoms include sore throat. Pertinent negatives include no chest pain, no chills, no ear congestion, no headaches, no rhinorrhea, no myalgias, no shortness of breath and no wheezing. Treatments tried: inhaler. The treatment provided mild relief. She is a smoker. Her past medical history is significant for bronchitis, COPD and asthma.    Past Medical History  Diagnosis Date  . GERD (gastroesophageal reflux disease)   . Gastritis   . Fibromyalgia   . HTN (hypertension)   . Edema of lower extremity   . Kidney stone   . Fibromyalgia   . Migraine   . DDD (degenerative disc disease)   . MS (multiple sclerosis)   . Diabetes mellitus   . Neuropathy   . Asthma     Past Surgical History  Procedure Date  . S/p hysterectomy   . Hiatal herina   . Fundic gland polyp     benign  . Sigmoidoscopy 01/31/08    large internal hemorrhoids/small rectal  polyp removed/rare sigmoid diverticula  . Esophagogastroduodenoscopy 10/29/07    normal  . Cholecystectomy   . Bladder surgery   . Abdominal hysterectomy   . Ddd     History reviewed. No pertinent family history.  History  Substance Use Topics  . Smoking status: Current Every Day Smoker  . Smokeless tobacco: Not on file  . Alcohol Use: No    OB History    Grav Para Term Preterm Abortions TAB SAB Ect Mult Living                  Review of Systems  Constitutional: Negative for fever, chills, activity change and appetite change.  HENT: Positive for congestion and sore throat. Negative for facial swelling, rhinorrhea, trouble swallowing, neck pain and neck stiffness.   Eyes: Negative for visual disturbance.  Respiratory: Positive for cough. Negative for chest tightness, shortness of breath, wheezing and stridor.   Cardiovascular: Negative for chest pain.  Gastrointestinal: Negative for nausea and vomiting.  Musculoskeletal: Negative for myalgias, back pain and arthralgias.  Skin: Positive for rash.  Neurological: Negative for dizziness, weakness, numbness and headaches.  Hematological: Negative for adenopathy.  Psychiatric/Behavioral: Negative for confusion.  All other systems reviewed and are negative.    Allergies  Demerol; Penicillins; and Sulfa antibiotics  Home Medications   Current Outpatient Rx  Name Route Sig Dispense Refill  . ALBUTEROL SULFATE HFA 108 (90 BASE) MCG/ACT IN  AERS Inhalation Inhale 2 puffs into the lungs every 6 (six) hours as needed. Shortness of breath.    . ALPRAZOLAM 0.5 MG PO TABS Oral Take 1-2 tablets by mouth Twice daily. 1 tablet in the morning and 2 tablets at bedtime.    Marland Kitchen CITALOPRAM HYDROBROMIDE 40 MG PO TABS Oral Take 40 mg by mouth daily.    . CYCLOBENZAPRINE HCL 10 MG PO TABS Oral Take 1 tablet by mouth Every 6 hours as needed. Muscle spasm.    Marland Kitchen FLUTICASONE-SALMETEROL 250-50 MCG/DOSE IN AEPB Inhalation Inhale 1 puff into the lungs  every 12 (twelve) hours.    . FUROSEMIDE 20 MG PO TABS Oral Take 20 mg by mouth daily.    Marland Kitchen LISINOPRIL 20 MG PO TABS Oral Take 20 mg by mouth daily.    Marland Kitchen METFORMIN HCL 500 MG PO TABS Oral Take 500 mg by mouth 2 (two) times daily with a meal.    . OXYCODONE-ACETAMINOPHEN 10-325 MG PO TABS Oral Take 1 tablet by mouth 4 times daily.    . AZITHROMYCIN 250 MG PO TABS  Take two tablets on day one, then one tab qd days 2-5 6 tablet 0  . PERMETHRIN 5 % EX CREA  Apply to the body, leave cream on for 10-12 hrs then wash off.  May re-apply in 7 days if needed 60 g 0  . PREDNISONE 10 MG PO TABS  Take 6 tablets day one, 5 tablets day two, 4 tablets day three, 3 tablets day four, 2 tablets day five, then 1 tablet day six 21 tablet 0    BP 136/71  Pulse 97  Temp 98.4 F (36.9 C) (Oral)  Resp 20  Ht 5\' 5"  (1.651 m)  Wt 151 lb (68.493 kg)  BMI 25.13 kg/m2  SpO2 97%  Physical Exam  Nursing note and vitals reviewed. Constitutional: She is oriented to person, place, and time. She appears well-developed and well-nourished. No distress.  HENT:  Head: Normocephalic and atraumatic.  Right Ear: Tympanic membrane and ear canal normal.  Left Ear: Tympanic membrane and ear canal normal.  Nose: No mucosal edema.  Mouth/Throat: Uvula is midline, oropharynx is clear and moist and mucous membranes are normal. No oropharyngeal exudate.  Eyes: EOM are normal. Pupils are equal, round, and reactive to light.  Neck: Normal range of motion. Neck supple.  Cardiovascular: Normal rate, regular rhythm, normal heart sounds and intact distal pulses.   No murmur heard. Pulmonary/Chest: Effort normal. No respiratory distress. She has no rales. She exhibits no tenderness.       Coarse lungs sounds bilaterally.  No active wheezing  Abdominal: Soft. She exhibits no distension. There is no tenderness.  Musculoskeletal: She exhibits no edema.  Lymphadenopathy:    She has no cervical adenopathy.  Neurological: She is alert and  oriented to person, place, and time. She exhibits normal muscle tone. Coordination normal.  Skin: Skin is warm and dry. Rash noted. No petechiae noted.       Maculopapular rash to the trunk, LE's, and web spaces of the fingers.  No vesicles, pustules or petechiae    ED Course  Procedures (including critical care time)  Labs Reviewed - No data to display No results found.   Dg Chest 2 View  08/06/2012  *RADIOLOGY REPORT*  Clinical Data: Cough.  CHEST - 2 VIEW  Comparison: October 19, 2011.  Findings: Cardiomediastinal silhouette appears normal.  The pulmonary disease is noted.  Bony thorax is intact.  IMPRESSION: No acute  cardiopulmonary abnormality seen.   Original Report Authenticated By: Venita Sheffield., M.D.     1. Cough   2. Scabies       MDM     Vital stable.  No tachycardia, tachypnea or hypoxia.  Doubt PE.  Will treat with abx and also treat for possible exposure to scabies.    Pt agrees to f/u with PMD or to return here if sx's worsen  The patient appears reasonably screened and/or stabilized for discharge and I doubt any other medical condition or other [facility] requiring further screening, evaluation, or treatment in the ED at this time prior to discharge.    Prescribed: premethrin cream Prednisone taper zithromax     Haywood Meinders L. Slayton [place], [place] 08/08/12 1745

## 2012-08-09 NOTE — ED Provider Notes (Signed)
Medical screening examination/treatment/procedure(s) were performed by non-physician practitioner and as supervising physician I was immediately available for consultation/collaboration.   Faith Patricelli, MD 08/09/12 0703 

## 2012-08-11 ENCOUNTER — Encounter (HOSPITAL_COMMUNITY): Payer: Self-pay | Admitting: Nurse Practitioner

## 2012-08-11 ENCOUNTER — Observation Stay (HOSPITAL_COMMUNITY)
Admission: EM | Admit: 2012-08-11 | Discharge: 2012-08-13 | Disposition: A | Discharge status: Home / Self Care | Payer: 59 | Attending: Internal Medicine | Admitting: Internal Medicine

## 2012-08-11 ENCOUNTER — Emergency Department (HOSPITAL_COMMUNITY): Payer: 59

## 2012-08-11 DIAGNOSIS — Z23 Encounter for immunization: Secondary | ICD-10-CM | POA: Insufficient documentation

## 2012-08-11 DIAGNOSIS — G35 Multiple sclerosis: Secondary | ICD-10-CM | POA: Insufficient documentation

## 2012-08-11 DIAGNOSIS — IMO0001 Reserved for inherently not codable concepts without codable children: Secondary | ICD-10-CM | POA: Insufficient documentation

## 2012-08-11 DIAGNOSIS — J441 Chronic obstructive pulmonary disease with (acute) exacerbation: Principal | ICD-10-CM | POA: Diagnosis present

## 2012-08-11 DIAGNOSIS — E1165 Type 2 diabetes mellitus with hyperglycemia: Secondary | ICD-10-CM | POA: Insufficient documentation

## 2012-08-11 DIAGNOSIS — D72829 Elevated white blood cell count, unspecified: Secondary | ICD-10-CM | POA: Diagnosis present

## 2012-08-11 DIAGNOSIS — I10 Essential (primary) hypertension: Secondary | ICD-10-CM | POA: Diagnosis present

## 2012-08-11 DIAGNOSIS — E039 Hypothyroidism, unspecified: Secondary | ICD-10-CM | POA: Insufficient documentation

## 2012-08-11 DIAGNOSIS — E119 Type 2 diabetes mellitus without complications: Secondary | ICD-10-CM | POA: Diagnosis present

## 2012-08-11 DIAGNOSIS — IMO0002 Reserved for concepts with insufficient information to code with codable children: Secondary | ICD-10-CM | POA: Insufficient documentation

## 2012-08-11 DIAGNOSIS — Z79899 Other long term (current) drug therapy: Secondary | ICD-10-CM | POA: Insufficient documentation

## 2012-08-11 HISTORY — DX: Heart failure, unspecified: I50.9

## 2012-08-11 HISTORY — DX: Adverse effect of unspecified anesthetic, initial encounter: T41.45XA

## 2012-08-11 HISTORY — DX: Other complications of anesthesia, initial encounter: T88.59XA

## 2012-08-11 HISTORY — DX: Chronic obstructive pulmonary disease, unspecified: J44.9

## 2012-08-11 LAB — POCT I-STAT TROPONIN I: Troponin i, poc: 0 ng/mL (ref 0.00–0.08)

## 2012-08-11 LAB — CBC WITH DIFFERENTIAL/PLATELET
Basophils Absolute: 0.1 10*3/uL (ref 0.0–0.1)
HCT: 41.6 % (ref 36.0–46.0)
Hemoglobin: 14.3 g/dL (ref 12.0–15.0)
Lymphocytes Relative: 22 % (ref 12–46)
Lymphs Abs: 2.8 10*3/uL (ref 0.7–4.0)
Monocytes Absolute: 1 10*3/uL (ref 0.1–1.0)
Neutro Abs: 8.3 10*3/uL — ABNORMAL HIGH (ref 1.7–7.7)
RBC: 4.74 MIL/uL (ref 3.87–5.11)
RDW: 13.8 % (ref 11.5–15.5)
WBC: 12.3 10*3/uL — ABNORMAL HIGH (ref 4.0–10.5)

## 2012-08-11 LAB — D-DIMER, QUANTITATIVE: D-Dimer, Quant: <0.27 ug/mL-FEU (ref 0.00–0.48)

## 2012-08-11 LAB — POCT I-STAT, CHEM 8
BUN: 11 mg/dL (ref 6–23)
Calcium, Ion: 1.17 mmol/L (ref 1.12–1.23)
Chloride: 102 mEq/L (ref 96–112)
Creatinine, Ser: 0.9 mg/dL (ref 0.50–1.10)
Sodium: 142 mEq/L (ref 135–145)

## 2012-08-11 LAB — TROPONIN I: Troponin I: <0.3 ng/mL (ref <0.30)

## 2012-08-11 LAB — PRO B NATRIURETIC PEPTIDE: Pro B Natriuretic peptide (BNP): 40.4 pg/mL (ref 0–125)

## 2012-08-11 MED ORDER — CITALOPRAM HYDROBROMIDE 40 MG PO TABS
40.0000 mg | ORAL_TABLET | Freq: Every day | ORAL | Status: DC
  Administered 2012-08-12 – 2012-08-13 (×2): 40 mg via ORAL
  Filled 2012-08-11 (×2): qty 1

## 2012-08-11 MED ORDER — FLUTICASONE-SALMETEROL 250-50 MCG/DOSE IN AEPB
1.0000 | INHALATION_SPRAY | Freq: Two times a day (BID) | RESPIRATORY_TRACT | Status: DC
  Administered 2012-08-12 – 2012-08-13 (×3): 1 via RESPIRATORY_TRACT
  Filled 2012-08-11: qty 14

## 2012-08-11 MED ORDER — PNEUMOCOCCAL VAC POLYVALENT 25 MCG/0.5ML IJ INJ
0.5000 mL | INJECTION | INTRAMUSCULAR | Status: AC
  Administered 2012-08-12: 0.5 mL via INTRAMUSCULAR
  Filled 2012-08-11: qty 0.5

## 2012-08-11 MED ORDER — ALBUTEROL SULFATE (5 MG/ML) 0.5% IN NEBU
5.0000 mg | INHALATION_SOLUTION | Freq: Once | RESPIRATORY_TRACT | Status: AC
  Administered 2012-08-11: 5 mg via RESPIRATORY_TRACT

## 2012-08-11 MED ORDER — SODIUM CHLORIDE 0.9 % IV BOLUS (SEPSIS)
1000.0000 mL | Freq: Once | INTRAVENOUS | Status: AC
  Administered 2012-08-11: 1000 mL via INTRAVENOUS

## 2012-08-11 MED ORDER — SODIUM CHLORIDE 0.9 % IV SOLN: INTRAVENOUS | Status: AC

## 2012-08-11 MED ORDER — INFLUENZA VIRUS VACC SPLIT PF IM SUSP
0.5000 mL | INTRAMUSCULAR | Status: AC
  Administered 2012-08-12: 0.5 mL via INTRAMUSCULAR
  Filled 2012-08-11: qty 0.5

## 2012-08-11 MED ORDER — IPRATROPIUM BROMIDE 0.02 % IN SOLN
0.5000 mg | Freq: Once | RESPIRATORY_TRACT | Status: AC
  Administered 2012-08-11: 0.5 mg via RESPIRATORY_TRACT

## 2012-08-11 MED ORDER — ALBUTEROL SULFATE (5 MG/ML) 0.5% IN NEBU
5.0000 mg | INHALATION_SOLUTION | RESPIRATORY_TRACT | Status: DC | PRN
  Administered 2012-08-12: 5 mg via RESPIRATORY_TRACT
  Filled 2012-08-11: qty 0.5

## 2012-08-11 MED ORDER — IPRATROPIUM BROMIDE 0.02 % IN SOLN
RESPIRATORY_TRACT | Status: AC
  Filled 2012-08-11: qty 2.5

## 2012-08-11 MED ORDER — ALBUTEROL SULFATE (5 MG/ML) 0.5% IN NEBU
10.0000 mg | INHALATION_SOLUTION | Freq: Once | RESPIRATORY_TRACT | Status: AC
  Administered 2012-08-11: 10 mg via RESPIRATORY_TRACT
  Filled 2012-08-11: qty 2

## 2012-08-11 MED ORDER — OXYCODONE-ACETAMINOPHEN 10-325 MG PO TABS: 1.0000 | ORAL_TABLET | Freq: Four times a day (QID) | ORAL | Status: DC | PRN

## 2012-08-11 MED ORDER — SODIUM CHLORIDE 0.9 % IV SOLN
Freq: Once | INTRAVENOUS | Status: AC
  Administered 2012-08-11: 18:00:00 via INTRAVENOUS

## 2012-08-11 MED ORDER — ALBUTEROL SULFATE (5 MG/ML) 0.5% IN NEBU
5.0000 mg | INHALATION_SOLUTION | RESPIRATORY_TRACT | Status: DC | PRN
  Administered 2012-08-11: 5 mg via RESPIRATORY_TRACT
  Filled 2012-08-11: qty 1

## 2012-08-11 MED ORDER — INSULIN ASPART 100 UNIT/ML ~~LOC~~ SOLN
0.0000 [IU] | SUBCUTANEOUS | Status: AC
  Administered 2012-08-11: 12 [IU] via SUBCUTANEOUS
  Administered 2012-08-11: 16 [IU] via SUBCUTANEOUS

## 2012-08-11 MED ORDER — CYCLOBENZAPRINE HCL 10 MG PO TABS
10.0000 mg | ORAL_TABLET | Freq: Every day | ORAL | Status: DC
  Filled 2012-08-11: qty 1

## 2012-08-11 MED ORDER — CYCLOBENZAPRINE HCL 10 MG PO TABS
10.0000 mg | ORAL_TABLET | Freq: Three times a day (TID) | ORAL | Status: DC | PRN
  Administered 2012-08-12 – 2012-08-13 (×4): 10 mg via ORAL
  Filled 2012-08-11 (×4): qty 1

## 2012-08-11 MED ORDER — LEVOTHYROXINE SODIUM 125 MCG PO TABS
187.5000 ug | ORAL_TABLET | ORAL | Status: DC
  Administered 2012-08-13: 187.5 ug via ORAL
  Filled 2012-08-11: qty 1.5

## 2012-08-11 MED ORDER — INSULIN ASPART 100 UNIT/ML ~~LOC~~ SOLN
0.0000 [IU] | SUBCUTANEOUS | Status: DC
  Administered 2012-08-12 (×3): 2 [IU] via SUBCUTANEOUS

## 2012-08-11 MED ORDER — OXYCODONE-ACETAMINOPHEN 5-325 MG PO TABS
2.0000 | ORAL_TABLET | Freq: Once | ORAL | Status: AC
  Administered 2012-08-11: 2 via ORAL
  Filled 2012-08-11: qty 2

## 2012-08-11 MED ORDER — ALPRAZOLAM 0.25 MG PO TABS
1.0000 mg | ORAL_TABLET | Freq: Every day | ORAL | Status: DC
  Administered 2012-08-11: 0.5 mg via ORAL
  Administered 2012-08-12: 1 mg via ORAL
  Filled 2012-08-11: qty 4
  Filled 2012-08-11: qty 2

## 2012-08-11 MED ORDER — CYCLOBENZAPRINE HCL 10 MG PO TABS
10.0000 mg | ORAL_TABLET | Freq: Every day | ORAL | Status: DC
  Administered 2012-08-11: 10 mg via ORAL

## 2012-08-11 MED ORDER — OXYCODONE-ACETAMINOPHEN 5-325 MG PO TABS
1.0000 | ORAL_TABLET | Freq: Four times a day (QID) | ORAL | Status: DC | PRN
  Administered 2012-08-12 – 2012-08-13 (×4): 1 via ORAL
  Filled 2012-08-11 (×5): qty 1

## 2012-08-11 MED ORDER — ALBUTEROL SULFATE (5 MG/ML) 0.5% IN NEBU
INHALATION_SOLUTION | RESPIRATORY_TRACT | Status: AC
  Filled 2012-08-11: qty 1

## 2012-08-11 MED ORDER — ALPRAZOLAM 0.25 MG PO TABS
0.5000 mg | ORAL_TABLET | Freq: Every morning | ORAL | Status: DC
  Administered 2012-08-12 – 2012-08-13 (×2): 0.5 mg via ORAL
  Filled 2012-08-11 (×4): qty 1

## 2012-08-11 MED ORDER — LISINOPRIL 20 MG PO TABS
20.0000 mg | ORAL_TABLET | Freq: Every day | ORAL | Status: DC
Start: 2012-08-12 — End: 2012-08-13
  Administered 2012-08-12 – 2012-08-13 (×2): 20 mg via ORAL
  Filled 2012-08-11 (×2): qty 1

## 2012-08-11 MED ORDER — METHYLPREDNISOLONE SODIUM SUCC 125 MG IJ SOLR
125.0000 mg | Freq: Once | INTRAMUSCULAR | Status: AC
  Administered 2012-08-11: 125 mg via INTRAVENOUS
  Filled 2012-08-11: qty 2

## 2012-08-11 MED ORDER — LEVOTHYROXINE SODIUM 125 MCG PO TABS
125.0000 ug | ORAL_TABLET | ORAL | Status: DC
  Administered 2012-08-12: 125 ug via ORAL
  Filled 2012-08-11 (×2): qty 1

## 2012-08-11 MED ORDER — DOXYCYCLINE HYCLATE 100 MG PO TABS
100.0000 mg | ORAL_TABLET | Freq: Two times a day (BID) | ORAL | Status: DC
  Administered 2012-08-11 – 2012-08-13 (×4): 100 mg via ORAL
  Filled 2012-08-11 (×5): qty 1

## 2012-08-11 MED ORDER — OXYCODONE HCL 5 MG PO TABS
5.0000 mg | ORAL_TABLET | Freq: Four times a day (QID) | ORAL | Status: DC | PRN
  Administered 2012-08-12 – 2012-08-13 (×5): 5 mg via ORAL
  Filled 2012-08-11 (×6): qty 1

## 2012-08-11 MED ORDER — METHYLPREDNISOLONE SODIUM SUCC 125 MG IJ SOLR
125.0000 mg | Freq: Every day | INTRAMUSCULAR | Status: DC
  Filled 2012-08-11: qty 2

## 2012-08-11 MED ORDER — IPRATROPIUM BROMIDE 0.02 % IN SOLN
0.5000 mg | RESPIRATORY_TRACT | Status: DC | PRN
  Administered 2012-08-12: 0.5 mg via RESPIRATORY_TRACT
  Filled 2012-08-11: qty 2.5

## 2012-08-11 MED ORDER — ALPRAZOLAM 0.25 MG PO TABS
0.5000 mg | ORAL_TABLET | Freq: Two times a day (BID) | ORAL | Status: DC
  Administered 2012-08-11: 0.5 mg via ORAL
  Filled 2012-08-11: qty 2

## 2012-08-11 MED ORDER — IPRATROPIUM BROMIDE 0.02 % IN SOLN
0.5000 mg | Freq: Once | RESPIRATORY_TRACT | Status: AC
  Administered 2012-08-11: 0.5 mg via RESPIRATORY_TRACT
  Filled 2012-08-11: qty 2.5

## 2012-08-11 MED ORDER — METFORMIN HCL 500 MG PO TABS
500.0000 mg | ORAL_TABLET | Freq: Two times a day (BID) | ORAL | Status: DC
  Administered 2012-08-12 – 2012-08-13 (×3): 500 mg via ORAL
  Filled 2012-08-11 (×5): qty 1

## 2012-08-11 NOTE — Progress Notes (Signed)
**Note De-Identified [name] Obfuscation** PEF 185 post HHN, patient states she is feeling better and is eating. VS WNL, BBS clear with slight expiratory wheezes.

## 2012-08-11 NOTE — ED Notes (Signed)
REPORT GIVEN TO 4700 UNIT NURSE , ADMITTING MD AT BEDSIDE EVALAUTING PT.

## 2012-08-11 NOTE — H&P (Addendum)
[facility] History and Physical  [name] WUJ:[id number] DOB: 28-Jul-1961 DOA: 08/11/2012  Referring physician: none PCP: Dr. Margo Aye at [facility]: none  Chief Complaint: wheezing and SOB on exertion and rest  HPI: [name] is a 51 y.o. female She lives with her husband and 4 dogs at [place]. She has h/o smoking (quit 2 weeks ago), 2 episodes of pneumonia (last one couple of years ago but no flu, takes flu vaccine every year), DM, cardiac w/u including ST and cath, CHF (on fluid pill). She had an episode of worsening SOB on rest and exertion with wheezing about a week ago. [facility] went to [facility] on 08/06/12 and received azithromycin x 5 day course with prednisone. She improved partially with intervention. Then she recurred same symptoms with worsening yesterday and could not sleep at night due to wheezing. She tried Advair and nebulizer at home with no response last night. She decided to come today as she could hardly walk due to SOB. She sleeps on 3 pillows at night due to avoid SOB. She has some fluid in legs in the past also. She denies any h/o PE, clots, coronary blockages, stroke, kidney problem. She has GERD. She denies any fever, chills, n/v/d/AP. Her chest hurts in the center of chest with coughing and deep breathing. She has epigastric hernia also. She has productive sputum with green and variable color. No hemoptysis. She is recovering from rash thought to be due to bedbug.   She had h/o SOB episodes (but not severe like this) in the past. She thinks she is responding well to current treatment in ED. Her husband has same wheezing issues. She tells me that she has some scar tissue in her lungs and airway with asthma like symptoms since remote surgery. She has h/o GB removal, bladder sling, CT release in the past. No h/o TB in the past.   Her back hurts due to DDD of spine. She is currently in transition to get new PCP with Dr. Margo Aye but her appt is in October.  Her  previous PCP was Dr. Alfonse Ras at [place].   Review of Systems: The patient denies anorexia, fever, weight loss,, vision loss, decreased hearing, hoarseness, syncope, balance deficits, hemoptysis, abdominal pain, melena, hematochezia,  hematuria, incontinence, genital sores, muscle weakness, suspicious skin lesions, transient blindness, difficulty walking, depression, unusual weight change, abnormal bleeding, enlarged lymph nodes, angioedema, and breast masses.   Past Medical History  Diagnosis Date  . GERD (gastroesophageal reflux disease)   . Gastritis   . Fibromyalgia   . HTN (hypertension)   . Edema of lower extremity   . Kidney stone   . Fibromyalgia   . Migraine   . DDD (degenerative disc disease)   . MS (multiple sclerosis)   . Diabetes mellitus   . Neuropathy   . Asthma   . CHF (congestive heart failure)    Past Surgical History  Procedure Date  . S/p hysterectomy   . Hiatal herina   . Fundic gland polyp     benign  . Sigmoidoscopy 01/31/08    large internal hemorrhoids/small rectal polyp removed/rare sigmoid diverticula  . Esophagogastroduodenoscopy 10/29/07    normal  . Cholecystectomy   . Bladder surgery   . Abdominal hysterectomy   . Ddd    Social History:  reports that she has quit smoking. She does not have any smokeless tobacco history on file. She reports that she does not drink alcohol or use illicit drugs. See HPI  Allergies  Allergen Reactions  . Demerol (Meperidine)   . Penicillins   . Sulfa Antibiotics   PCN caused her swelling and difficulty breathing in the past. Sulfa causes rash.   History reviewed. No pertinent family history.  Prior to Admission medications   Medication Sig Start Date End Date Taking? Authorizing Provider  albuterol (PROVENTIL HFA;VENTOLIN HFA) 108 (90 BASE) MCG/ACT inhaler Inhale 2 puffs into the lungs every 6 (six) hours as needed. Shortness of breath.   Yes Historical Provider, MD  ALPRAZolam Prudy Feeler) 0.5 MG tablet Take  1-2 tablets by mouth Twice daily. 1 tablet in the morning and 2 tablets at bedtime. 05/31/12  Yes Historical Provider, MD  azithromycin (ZITHROMAX) 250 MG tablet Take 250-500 mg by mouth daily. 2 tabs day 1, then 1 tab daily for 5 days; Start date 08/06/12 08/06/12  Yes Tammy L. Triplett, PA  citalopram (CELEXA) 40 MG tablet Take 40 mg by mouth daily.   Yes Historical Provider, MD  cyclobenzaprine (FLEXERIL) 10 MG tablet Take 1 tablet by mouth Every 6 hours as needed. For muscle spasm. 06/01/12  Yes Historical Provider, MD  Fluticasone-Salmeterol (ADVAIR) 250-50 MCG/DOSE AEPB Inhale 1 puff into the lungs every 12 (twelve) hours.   Yes Historical Provider, MD  furosemide (LASIX) 20 MG tablet Take 20 mg by mouth daily.   Yes Historical Provider, MD  lisinopril (PRINIVIL,ZESTRIL) 20 MG tablet Take 20 mg by mouth daily.   Yes Historical Provider, MD  metFORMIN (GLUCOPHAGE) 500 MG tablet Take 500 mg by mouth 2 (two) times daily with a meal.   Yes Historical Provider, MD  oxyCODONE-acetaminophen (PERCOCET) 10-325 MG per tablet Take 1 tablet by mouth 4 times daily. 05/16/12  Yes Historical Provider, MD  permethrin (ELIMITE) 5 % cream Apply 1 application topically daily as needed. Apply to the body, leave cream on for 10-12 hrs then wash off.  May re-apply in 7 days if needed 08/06/12 08/11/12 Yes Tammy L. Triplett, PA  predniSONE (DELTASONE) 10 MG tablet Take 10-60 mg by mouth daily. Take 6 tablets day one, 5 tablets day two, 4 tablets day three, 3 tablets day four, 2 tablets day five, then 1 tablet day six 08/06/12  Yes Tammy L. [place], [place]   Physical Exam: Filed Vitals:   08/11/12 1534 08/11/12 1633 08/11/12 1900 08/11/12 2000  BP: 130/66  115/62 110/64  Pulse:   100 104  Temp:      TempSrc:      Resp: 24  20 22   Height:      Weight:      SpO2: 95% 99% 97% 99%     General:  A, O x 3, sitting up in bed with nebulizer mask on, mild distress noted.   Eyes: conj pink, PEERL, EOMI  ENT: OMM, no  thrush  Neck: thick neck, no thyromegaly or JVD  Cardiovascular: tachycardic, irreg irreg S1 and S2  Respiratory: prolonged expiratory wheezes noted globally, good AE  Abdomen: non tender, ND, BS +, no tender in epigastric or xiphoid area  Skin: no bruises, skin lesions noted  Musculoskeletal: good muscle tone and strength  Psychiatric: not depressed, normal affect  Neurologic: Strength good in all 4 extremities, decreased touch sensation in the feet  Ext : no c/c/e, PPx4  Labs on Admission:  Basic Metabolic Panel:  Lab 08/11/12 1610  NA 142  K 3.6  CL 102  CO2 --  GLUCOSE 325*  BUN 11  CREATININE 0.90  CALCIUM --  MG --  PHOS --  Liver Function Tests: No results found for this basename: AST:5,ALT:5,ALKPHOS:5,BILITOT:5,PROT:5,ALBUMIN:5 in the last 168 hours No results found for this basename: LIPASE:5,AMYLASE:5 in the last 168 hours No results found for this basename: AMMONIA:5 in the last 168 hours CBC:  Lab 08/11/12 1822 08/11/12 1425  WBC -- 12.3*  NEUTROABS -- 8.3*  HGB 13.9 14.3  HCT 41.0 41.6  MCV -- 87.8  PLT -- 290   Cardiac Enzymes: No results found for this basename: CKTOTAL:5,CKMB:5,CKMBINDEX:5,TROPONINI:5 in the last 168 hours  BNP (last 3 results) No results found for this basename: PROBNP:3 in the last 8760 hours CBG: No results found for this basename: GLUCAP:5 in the last 168 hours  Radiological Exams on Admission: Dg Chest 2 View  08/11/2012  *RADIOLOGY REPORT*  Clinical Data: Shortness of breath with cough and congestion. History of diabetes and hypertension.  CHEST - 2 VIEW  Comparison: 08/06/2012 and 03/11/2011.  Findings: The heart size and mediastinal contours are normal. The lungs are clear. There is no pleural effusion or pneumothorax. No acute osseous findings are identified.  IMPRESSION: Stable examination.  No active cardiopulmonary process.   Original Report Authenticated By: Gerrianne Scale, M.D.     EKG: Independently  reviewed.   Assessment/Plan  1. Acute COPD exacerbation with bronchitis as evident by worsening SOB, pleuritic chest pain, sputum production (but clear chest xray) and history of smoking.        We will provide telemetry, D-dimer, Cardiac enzymes, BT with albuterol and ipratropium, oral       Doxycycline for bronchitis and COPD exacerbation, IV solumedrol 125 mg daily while in hospital   And then oral taper on discharge. Consider PFT soon. Advised to stay away from smoking.   2. DM 2, uncontrolled probably contributed by steroid use, continue oral meds. Consider intensification.  3. Uncontrolled HTN could be due to COPD. Cont home meds 4. Leucocytosis could be from bronchitis and/or steroid use    Code Status: Full Family Communication: none Disposition Plan: Observation  Time spent: 1 hour  Paula Busenbark V. [facility] Pager [phone]  If 7PM-7AM, please contact night-coverage [email or web address] Password [email or web address] 08/11/2012, 8:24 PM

## 2012-08-11 NOTE — ED Notes (Signed)
C/o productive cough with green sputum, cold symptoms, congestion and wheezing since Sunday. Dx w pneumonia at [facility] on Monday. Pt took course of prednisone and zpak with no relief, 1 more day of zpak left.

## 2012-08-11 NOTE — Progress Notes (Signed)
**Note De-Identified [name] Obfuscation** Adult wheeze protocol initiated patients VS WNL, BBS expiratory wheezes, PEF 180 HHN 5.0mg  albuterol and 0.5mg  of atrovent given.

## 2012-08-11 NOTE — ED Provider Notes (Signed)
History   This chart was scribed for Nichole Horn, MD by Melba Coon. The patient was seen in room 4733/4733-02 and the patient's care was started at 2:02PM.    [name]: [id number]  Arrival date & time 08/11/12  1213   None     Chief Complaint  Patient presents with  . Shortness of Breath    (Consider location/radiation/quality/duration/timing/severity/associated sxs/prior treatment) The history is provided by the patient. No language interpreter was used.   RICKEY FARRIER is a 51 y.o. female who presents to the Emergency Department complaining of persistent, moderate to severe shortness of breath with associated productive cough, congestion, and wheezing with an onset a week ago that has gotten progressively worse. She was dx'd with pneumonia at [facility] 6 days ago and was Rx prednisone and Azithromycin which have not fully alleviate the symptoms. She is not on supplemental O2 at home, she uses her home nebulizer 3x/day, and uses an albuterol inhaler several times/day. Cough-induced CP and abd pain present. Denies HA, fever, neck pain, sore throat, rash, back pain, n/v/d, dysuria, or extremity pain, edema, weakness, numbness, or tingling. No other pertinent medical symptoms.   Past Medical History  Diagnosis Date  . GERD (gastroesophageal reflux disease)   . Gastritis   . Fibromyalgia   . HTN (hypertension)   . Edema of lower extremity   . Kidney stone   . Fibromyalgia   . Migraine   . DDD (degenerative disc disease)   . MS (multiple sclerosis)   . Diabetes mellitus   . Neuropathy   . Asthma   . CHF (congestive heart failure)   . COPD (chronic obstructive pulmonary disease)   . Complication of anesthesia     Past Surgical History  Procedure Date  . S/p hysterectomy   . Hiatal herina   . Fundic gland polyp     benign  . Sigmoidoscopy 01/31/08    large internal hemorrhoids/small rectal polyp removed/rare sigmoid diverticula  . Esophagogastroduodenoscopy 10/29/07   normal  . Cholecystectomy   . Bladder surgery   . Abdominal hysterectomy   . Ddd     History reviewed. No pertinent family history.  History  Substance Use Topics  . Smoking status: Former Games developer  . Smokeless tobacco: Former Neurosurgeon    Quit date: 07/07/2012  . Alcohol Use: No    OB History    Grav Para Term Preterm Abortions TAB SAB Ect Mult Living                  Review of Systems  Respiratory: Positive for shortness of breath.    10 Systems reviewed and all are negative for acute change except as noted in the HPI.   Allergies  Demerol; Penicillins; and Sulfa antibiotics  Home Medications   Current Outpatient Rx  Name Route Sig Dispense Refill  . ALBUTEROL SULFATE HFA 108 (90 BASE) MCG/ACT IN AERS Inhalation Inhale 2 puffs into the lungs every 6 (six) hours as needed. Shortness of breath.    . ALPRAZOLAM 0.5 MG PO TABS Oral Take 1-2 tablets by mouth Twice daily. 1 tablet in the morning and 2 tablets at bedtime.    Marland Kitchen CITALOPRAM HYDROBROMIDE 40 MG PO TABS Oral Take 40 mg by mouth daily.    . CYCLOBENZAPRINE HCL 10 MG PO TABS Oral Take 1 tablet by mouth Every 6 hours as needed. For muscle spasm.    Marland Kitchen FLUTICASONE-SALMETEROL 250-50 MCG/DOSE IN AEPB Inhalation Inhale 1 puff into the lungs every  12 (twelve) hours.    . FUROSEMIDE 20 MG PO TABS Oral Take 20 mg by mouth daily.    Marland Kitchen LISINOPRIL 20 MG PO TABS Oral Take 20 mg by mouth daily.    Marland Kitchen METFORMIN HCL 500 MG PO TABS Oral Take 500 mg by mouth 2 (two) times daily with a meal.    . OXYCODONE-ACETAMINOPHEN 10-325 MG PO TABS Oral Take 1 tablet by mouth 4 times daily.    . ALBUTEROL SULFATE (5 MG/ML) 0.5% IN NEBU Nebulization Take 0.5 mLs (2.5 mg total) by nebulization every 6 (six) hours as needed for wheezing. 20 mL 0  . DOXYCYCLINE HYCLATE 100 MG PO TABS Oral Take 1 tablet (100 mg total) by mouth every 12 (twelve) hours. 12 tablet 0  . LEVOTHYROXINE SODIUM 125 MCG PO TABS Oral Take 1 tablet (125 mcg total) by mouth See admin  instructions. Take 1 tablet (125 mcg total) by mouth Once per day on Sun Tue Wed Fri Sat.    Marland Kitchen LEVOTHYROXINE SODIUM 125 MCG PO TABS Oral Take 1.5 tablets (187.5 mcg total) by mouth See admin instructions. Take 1.5 tablets (187.5 mcg total) by mouth Once per day on Mon Thu.    . PREDNISONE 10 MG PO TABS  Take 40 mg daily for 1 day, then 20 mg daily for 2 days, then 10 mg daily for 2 days, then 5 mg daily for 2 days then discontinue 14 tablet 0    BP 130/67  Pulse 93  Temp 97.7 F (36.5 C) (Oral)  Resp 17  Ht 5\' 5"  (1.651 m)  Wt 151 lb 6.4 oz (68.675 kg)  BMI 25.19 kg/m2  SpO2 95%  Physical Exam  Nursing note and vitals reviewed. Constitutional:       Awake, alert, nontoxic appearance.  HENT:  Head: Atraumatic.  Eyes: Right eye exhibits no discharge. Left eye exhibits no discharge.  Neck: Neck supple.  Pulmonary/Chest: Effort normal. She has wheezes (diffuse wheezing and rhonchi). She exhibits tenderness (cough-induced).  Abdominal: Soft. There is tenderness (cough-induced). There is no rebound.  Musculoskeletal: She exhibits no tenderness.       Baseline ROM, no obvious new focal weakness.  Neurological:       Mental status and motor strength appears baseline for patient and situation.  Skin: No rash noted.  Psychiatric: She has a normal mood and affect.    ED Course  Procedures (including critical care time)  COORDINATION OF CARE:  2:05PM - breathing tx given here has alleviated her symptoms. Imaging reviewed and was unremarkable. She wants to be admitted to the hospital due to inability to care for herself at home and will be transferred to CDU.   Labs Reviewed  CBC WITH DIFFERENTIAL - Abnormal; Notable for the following:    WBC 12.3 (*)     Neutro Abs 8.3 (*)     All other components within normal limits  POCT I-STAT, CHEM 8 - Abnormal; Notable for the following:    Glucose, Bld 325 (*)     All other components within normal limits  CBC WITH DIFFERENTIAL - Abnormal;  Notable for the following:    WBC 12.9 (*)     Neutrophils Relative 86 (*)     Neutro Abs 11.1 (*)     Lymphocytes Relative 9 (*)     All other components within normal limits  HEMOGLOBIN A1C - Abnormal; Notable for the following:    Hemoglobin A1C 7.1 (*)     Mean Plasma Glucose  157 (*)     All other components within normal limits  GLUCOSE, CAPILLARY - Abnormal; Notable for the following:    Glucose-Capillary 319 (*)     All other components within normal limits  GLUCOSE, CAPILLARY - Abnormal; Notable for the following:    Glucose-Capillary 281 (*)     All other components within normal limits  GLUCOSE, CAPILLARY - Abnormal; Notable for the following:    Glucose-Capillary 145 (*)     All other components within normal limits  GLUCOSE, CAPILLARY - Abnormal; Notable for the following:    Glucose-Capillary 132 (*)     All other components within normal limits  GLUCOSE, CAPILLARY - Abnormal; Notable for the following:    Glucose-Capillary 309 (*)     All other components within normal limits  CBC - Abnormal; Notable for the following:    WBC 14.2 (*)     All other components within normal limits  BASIC METABOLIC PANEL - Abnormal; Notable for the following:    Glucose, Bld 167 (*)     All other components within normal limits  GLUCOSE, CAPILLARY - Abnormal; Notable for the following:    Glucose-Capillary 266 (*)     All other components within normal limits  GLUCOSE, CAPILLARY - Abnormal; Notable for the following:    Glucose-Capillary 164 (*)     All other components within normal limits  GLUCOSE, CAPILLARY - Abnormal; Notable for the following:    Glucose-Capillary 195 (*)     All other components within normal limits  POCT I-STAT TROPONIN I  D-DIMER, QUANTITATIVE  TROPONIN I  TROPONIN I  TROPONIN I  PRO B NATRIURETIC PEPTIDE  LAB REPORT - SCANNED   No results found.   1. COPD exacerbation   2. DM (diabetes mellitus)   3. Hypertension   4. Leucocytosis        MDM  I personally performed the services described in this documentation, which was scribed in my presence. The recorded information has been reviewed and considered.        Nichole Horn, MD 08/20/12 [phone]

## 2012-08-12 LAB — CBC WITH DIFFERENTIAL/PLATELET
Eosinophils Absolute: 0 10*3/uL (ref 0.0–0.7)
Eosinophils Relative: 0 % (ref 0–5)
HCT: 37.1 % (ref 36.0–46.0)
Lymphocytes Relative: 9 % — ABNORMAL LOW (ref 12–46)
Lymphs Abs: 1.1 10*3/uL (ref 0.7–4.0)
MCH: 29.9 pg (ref 26.0–34.0)
MCV: 88.8 fL (ref 78.0–100.0)
Monocytes Absolute: 0.7 10*3/uL (ref 0.1–1.0)
Monocytes Relative: 5 % (ref 3–12)
Platelets: 269 10*3/uL (ref 150–400)
RBC: 4.18 MIL/uL (ref 3.87–5.11)
WBC: 12.9 10*3/uL — ABNORMAL HIGH (ref 4.0–10.5)

## 2012-08-12 LAB — GLUCOSE, CAPILLARY
Glucose-Capillary: 145 mg/dL — ABNORMAL HIGH (ref 70–99)
Glucose-Capillary: 309 mg/dL — ABNORMAL HIGH (ref 70–99)

## 2012-08-12 LAB — TROPONIN I
Troponin I: <0.3 ng/mL (ref <0.30)
Troponin I: <0.3 ng/mL (ref <0.30)

## 2012-08-12 MED ORDER — TIOTROPIUM BROMIDE MONOHYDRATE 18 MCG IN CAPS
18.0000 ug | ORAL_CAPSULE | Freq: Every day | RESPIRATORY_TRACT | Status: DC
  Filled 2012-08-12: qty 5

## 2012-08-12 MED ORDER — PREDNISONE 20 MG PO TABS
40.0000 mg | ORAL_TABLET | Freq: Every day | ORAL | Status: DC
  Filled 2012-08-12: qty 2

## 2012-08-12 MED ORDER — ALBUTEROL SULFATE (5 MG/ML) 0.5% IN NEBU
2.5000 mg | INHALATION_SOLUTION | Freq: Four times a day (QID) | RESPIRATORY_TRACT | Status: DC
  Administered 2012-08-12 – 2012-08-13 (×4): 2.5 mg via RESPIRATORY_TRACT
  Filled 2012-08-12 (×4): qty 0.5

## 2012-08-12 MED ORDER — ALBUTEROL SULFATE HFA 108 (90 BASE) MCG/ACT IN AERS: 2.0000 | INHALATION_SPRAY | RESPIRATORY_TRACT | Status: DC | PRN

## 2012-08-12 MED ORDER — INSULIN ASPART 100 UNIT/ML ~~LOC~~ SOLN
0.0000 [IU] | Freq: Three times a day (TID) | SUBCUTANEOUS | Status: DC
  Administered 2012-08-12: 7 [IU] via SUBCUTANEOUS
  Administered 2012-08-13 (×2): 2 [IU] via SUBCUTANEOUS

## 2012-08-12 MED ORDER — PREDNISONE 20 MG PO TABS
40.0000 mg | ORAL_TABLET | Freq: Every day | ORAL | Status: DC
  Administered 2012-08-13: 40 mg via ORAL
  Filled 2012-08-12 (×2): qty 2

## 2012-08-12 MED ORDER — IPRATROPIUM BROMIDE 0.02 % IN SOLN
0.5000 mg | Freq: Four times a day (QID) | RESPIRATORY_TRACT | Status: DC
  Administered 2012-08-12 – 2012-08-13 (×4): 0.5 mg via RESPIRATORY_TRACT
  Filled 2012-08-12 (×4): qty 2.5

## 2012-08-12 MED ORDER — METHYLPREDNISOLONE SODIUM SUCC 40 MG IJ SOLR
40.0000 mg | Freq: Four times a day (QID) | INTRAMUSCULAR | Status: AC
  Administered 2012-08-12 (×2): 40 mg via INTRAVENOUS
  Filled 2012-08-12 (×2): qty 1

## 2012-08-12 MED ORDER — ALBUTEROL SULFATE HFA 108 (90 BASE) MCG/ACT IN AERS
2.0000 | INHALATION_SPRAY | Freq: Three times a day (TID) | RESPIRATORY_TRACT | Status: DC
  Filled 2012-08-12: qty 6.7

## 2012-08-12 NOTE — Progress Notes (Signed)
Subjective: Breathing improved but still showed of breath.  Denies any chest pain.  Objective: Vital signs in last 24 hours: Filed Vitals:   08/11/12 2055 08/12/12 0216 08/12/12 0407 08/12/12 0758  BP: 153/78 97/47 129/73   Pulse: 103 91 82   Temp: 97.7 F (36.5 C) 97.5 F (36.4 C) 97.2 F (36.2 C)   TempSrc: Oral Oral Oral   Resp: 22 21 18    Height: 5\' 5"  (1.651 m)     Weight: 68.947 kg (152 lb)     SpO2: 95% 99% 100% 100%   Weight change:   Intake/Output Summary (Last 24 hours) at 08/12/12 0918 Last data filed at 08/12/12 0844  Gross per 24 hour  Intake   1920 ml  Output    200 ml  Net   1720 ml    Physical Exam: General: Awake, Oriented, No acute distress. HEENT: EOMI. Neck: Supple CV: S1 and S2 Lungs: Expiratory wheezing bilaterally. Abdomen: Soft, Nontender, Nondistended, +bowel sounds. Ext: Good pulses. Trace edema.  Lab Results: Basic Metabolic Panel:  Lab 08/11/12 1610  NA 142  K 3.6  CL 102  CO2 --  GLUCOSE 325*  BUN 11  CREATININE 0.90  CALCIUM --  MG --  PHOS --   Liver Function Tests: No results found for this basename: AST:5,ALT:5,ALKPHOS:5,BILITOT:5,PROT:5,ALBUMIN:5 in the last 168 hours No results found for this basename: LIPASE:5,AMYLASE:5 in the last 168 hours No results found for this basename: AMMONIA:5 in the last 168 hours CBC:  Lab 08/12/12 0202 08/11/12 1822 08/11/12 1425  WBC 12.9* -- 12.3*  NEUTROABS 11.1* -- 8.3*  HGB 12.5 13.9 14.3  HCT 37.1 41.0 41.6  MCV 88.8 -- 87.8  PLT 269 -- 290   Cardiac Enzymes:  Lab 08/12/12 0202 08/11/12 2204  CKTOTAL -- --  CKMB -- --  CKMBINDEX -- --  TROPONINI <0.30 <0.30   BNP (last 3 results)  Basename 08/11/12 2204  PROBNP 40.4   CBG:  Lab 08/12/12 0401 08/11/12 2317 08/11/12 2136  GLUCAP 145* 281* 319*   No results found for this basename: HGBA1C:5 in the last 72 hours Other Labs: No components found with this basename: POCBNP:3  Lab 08/11/12 2205  DDIMER <0.27   No  results found for this basename: CHOL:2,HDL:2,LDLCALC:2,TRIG:2,CHOLHDL:2,LDLDIRECT:2 in the last 168 hours No results found for this basename: TSH,T4TOTAL,FREET3,T3FREE,FREET4,THYROIDAB in the last 168 hours No results found for this basename: VITAMINB12:2,FOLATE:2,FERRITIN:2,TIBC:2,IRON:2,RETICCTPCT:2 in the last 168 hours  Micro Results: No results found for this or any previous visit (from the past 240 hour(s)).  Studies/Results: Dg Chest 2 View  08/11/2012  *RADIOLOGY REPORT*  Clinical Data: Shortness of breath with cough and congestion. History of diabetes and hypertension.  CHEST - 2 VIEW  Comparison: 08/06/2012 and 03/11/2011.  Findings: The heart size and mediastinal contours are normal. The lungs are clear. There is no pleural effusion or pneumothorax. No acute osseous findings are identified.  IMPRESSION: Stable examination.  No active cardiopulmonary process.   Original Report Authenticated By: Gerrianne Scale, M.D.     Medications: I have reviewed the patient's current medications. Scheduled Meds:   . sodium chloride   Intravenous Once  . albuterol  2 puff Inhalation TID  . albuterol  10 mg Nebulization Once  . albuterol  5 mg Nebulization Once  . albuterol      . ALPRAZolam  0.5 mg Oral q morning - 10a  . ALPRAZolam  1 mg Oral QHS  . citalopram  40 mg Oral Daily  .  doxycycline  100 mg Oral Q12H  . Fluticasone-Salmeterol  1 puff Inhalation Q12H  . influenza  inactive virus vaccine  0.5 mL Intramuscular Tomorrow-1000  . insulin aspart  0-24 Units Subcutaneous Q2H   Followed by  . insulin aspart  0-24 Units Subcutaneous Q4H  . ipratropium      . ipratropium  0.5 mg Nebulization Once  . ipratropium  0.5 mg Nebulization Once  . levothyroxine  125 mcg Oral Custom  . levothyroxine  187.5 mcg Oral Custom  . lisinopril  20 mg Oral Daily  . metFORMIN  500 mg Oral BID WC  . methylPREDNISolone (SOLU-MEDROL) injection  125 mg Intravenous Once  . methylPREDNISolone (SOLU-MEDROL)  injection  125 mg Intravenous Daily  . oxyCODONE-acetaminophen  2 tablet Oral Once  . oxyCODONE-acetaminophen  2 tablet Oral Once  . pneumococcal 23 valent vaccine  0.5 mL Intramuscular Tomorrow-1000  . sodium chloride  1,000 mL Intravenous Once  . DISCONTD: ALPRAZolam  0.5 mg Oral BID  . DISCONTD: cyclobenzaprine  10 mg Oral Daily  . DISCONTD: cyclobenzaprine  10 mg Oral Daily   Continuous Infusions:   . sodium chloride     PRN Meds:.albuterol, albuterol, cyclobenzaprine, ipratropium, oxyCODONE, oxyCODONE-acetaminophen, DISCONTD: albuterol, DISCONTD: oxyCODONE-acetaminophen  Assessment/Plan: Acute respiratory failure/shortness of breath secondary to COPD exacerbation with bronchitis with pleuritic chest pain, sputum production (but clear chest xray) and history of smoking D-dimer negative.  Troponin negative x3.  Transition Solu-Medrol to prednisone, taper steroids depending on patient's clinical progress.  Continue doxycycline started on 08/11/2012.  Likely will benefit from outpatient pulmonary function tests for baseline lung function.  Tobacco abuse Patient counseled on smoking cessation.  Patient declined nicotine patch.  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  Sliding scale insulin.  Hemoglobin A1c 7.1 on 08/12/2012 suggesting an average blood sugar of 156.   Uncontrolled HTN could be due to COPD Improved.  Continue lisinopril.  Leucocytosis Could be from bronchitis and/or steroid use.  Generalized weakness Will request PT consult.  Hypothyroidism Continue levothyroxine.  Prophylaxis SCDs.  Disposition Consider discharge in 1-2 days depending on improvement in breathing.  Will request physical therapy consult given patient was expressing generalized weakness.   LOS: 1 day  Maimouna Rondeau A, MD 08/12/2012, 9:18 AM

## 2012-08-12 NOTE — Evaluation (Signed)
Physical Therapy Evaluation Patient Details Name: [name] MRN: [id number] DOB: January 01, 1961 Today's Date: 08/12/2012 Time: 4098-1191 PT Time Calculation (min): 24 min  PT Assessment / Plan / Recommendation Clinical Impression  Patient is a 51 yo female admitted with COPD exacerbation.  Dyspnea impacting mobility.  Instructed patient in pursed lip breathing and deep breathing.  Patient will benefit from acute PT to maximize independence prior to discharge.    PT Assessment  Patient needs continued PT services    Follow Up Recommendations  No PT follow up;Supervision/Assistance - 24 hour    Does the patient have the potential to tolerate intense rehabilitation      Barriers to Discharge None      Equipment Recommendations  None recommended by PT    Recommendations for Other Services     Frequency Min 3X/week    Precautions / Restrictions Precautions Precautions: None Restrictions Weight Bearing Restrictions: No         Mobility  Bed Mobility Bed Mobility: Supine to Sit;Sit to Supine Supine to Sit: 6: Modified independent (Device/Increase time);With rails Sit to Supine: 6: Modified independent (Device/Increase time);With rail Details for Bed Mobility Assistance: No cues or assist needed Transfers Transfers: Sit to Stand;Stand to Sit Sit to Stand: 7: Independent;With upper extremity assist Stand to Sit: 7: Independent;With upper extremity assist;To bed Details for Transfer Assistance: No cues or assist needed. Ambulation/Gait Ambulation/Gait Assistance: 5: Supervision Ambulation Distance (Feet): 86 Feet Assistive device: None Ambulation/Gait Assistance Details: Patient with slow gait speed, requiring 2 standing rest breaks.  Patient occasionally reached for rail in hallway.  Good balance.  Instructed patient in pursed lip breathing to attempt to minimize dyspnea during gait.  Patient with 3/4 dyspnea. Gait Pattern: Within Functional Limits Gait velocity: Slow  gait speed           PT Diagnosis: Difficulty walking;Generalized weakness  PT Problem List: Decreased strength;Decreased activity tolerance;Decreased mobility;Cardiopulmonary status limiting activity PT Treatment Interventions: Gait training;Stair training;Functional mobility training;Patient/family education   PT Goals Acute Rehab PT Goals PT Goal Formulation: With patient Time For Goal Achievement: 08/15/12 Potential to Achieve Goals: Good Pt will Ambulate: >150 feet;Independently (without loss of balance) PT Goal: Ambulate - Progress: Goal set today Pt will Go Up / Down Stairs: 3-5 stairs;with supervision;with rail(s) PT Goal: Up/Down Stairs - Progress: Goal set today  Visit Information  Last PT Received On: 08/12/12 Assistance Needed: +1    Subjective Data  Subjective: "My breathing is so tight" Patient Stated Goal: To return home   Prior Functioning  Home Living Lives With: Spouse Available Help at Discharge: Family;Available 24 hours/day Type of Home: Mobile home Home Access: Stairs to enter Entrance Stairs-Number of Steps: 5 Entrance Stairs-Rails: Right;Left Home Layout: One level Bathroom Shower/Tub: Engineer, manufacturing systems: Standard Home Adaptive Equipment: Straight cane Prior Function Level of Independence: Needs assistance Needs Assistance: Light Housekeeping Light Housekeeping: Moderate Able to Take Stairs?: Yes Driving: Yes Communication Communication: No difficulties    Cognition  Overall Cognitive Status: Appears within functional limits for tasks assessed/performed Arousal/Alertness: Awake/alert Orientation Level: Appears intact for tasks assessed Behavior During Session: [facility] for tasks performed    Extremity/Trunk Assessment Right Upper Extremity Assessment RUE ROM/Strength/Tone: [facility] for tasks assessed Left Upper Extremity Assessment LUE ROM/Strength/Tone: [facility] for tasks assessed Right Lower Extremity Assessment RLE ROM/Strength/Tone:  [facility] for tasks assessed Left Lower Extremity Assessment LLE ROM/Strength/Tone: [facility] for tasks assessed Trunk Assessment Trunk Assessment: Normal   Balance    End of Session PT -  End of Session Equipment Utilized During Treatment: Gait belt Activity Tolerance: Patient limited by fatigue;Treatment limited secondary to medical complications (Comment) (Limited by dyspnea) Patient left: in bed;with call bell/phone within reach Nurse Communication: Mobility status  GP Functional Assessment Tool Used: Clinical judgement Functional Limitation: Mobility: Walking and moving around Mobility: Walking and Moving Around Current Status ([id number]): At least 1 percent but less than 20 percent impaired, limited or restricted Mobility: Walking and Moving Around Goal Status [phone]): 0 percent impaired, limited or restricted   Vena Austria 08/12/2012, 5:48 PM Durenda Hurt. Renaldo Fiddler, [facility] Acute Rehab Services Pager [phone]

## 2012-08-12 NOTE — ED Provider Notes (Signed)
Consulted with triad hospitalist after labs resulted to request admission for COPD exacerbation.  Patient admitted to telemetry, team 6.  Jimmye Norman, NP 08/12/12 [phone]

## 2012-08-13 LAB — BASIC METABOLIC PANEL
BUN: 14 mg/dL (ref 6–23)
CO2: 25 mEq/L (ref 19–32)
Chloride: 102 mEq/L (ref 96–112)
GFR calc non Af Amer: >90 mL/min (ref >90)
Glucose, Bld: 167 mg/dL — ABNORMAL HIGH (ref 70–99)
Potassium: 4.2 mEq/L (ref 3.5–5.1)
Sodium: 137 mEq/L (ref 135–145)

## 2012-08-13 LAB — CBC
HCT: 38.1 % (ref 36.0–46.0)
Hemoglobin: 12.7 g/dL (ref 12.0–15.0)
MCHC: 33.3 g/dL (ref 30.0–36.0)
RBC: 4.25 MIL/uL (ref 3.87–5.11)
WBC: 14.2 10*3/uL — ABNORMAL HIGH (ref 4.0–10.5)

## 2012-08-13 LAB — GLUCOSE, CAPILLARY: Glucose-Capillary: 164 mg/dL — ABNORMAL HIGH (ref 70–99)

## 2012-08-13 MED ORDER — OXYCODONE-ACETAMINOPHEN 5-325 MG PO TABS
1.0000 | ORAL_TABLET | Freq: Once | ORAL | Status: AC
  Administered 2012-08-13: 1 via ORAL
  Filled 2012-08-13: qty 1

## 2012-08-13 MED ORDER — LEVOTHYROXINE SODIUM 125 MCG PO TABS: 187.5000 ug | ORAL_TABLET | ORAL | Status: DC

## 2012-08-13 MED ORDER — LEVOTHYROXINE SODIUM 125 MCG PO TABS: 125.0000 ug | ORAL_TABLET | ORAL | Status: DC

## 2012-08-13 MED ORDER — ALBUTEROL SULFATE (5 MG/ML) 0.5% IN NEBU: 2.5000 mg | INHALATION_SOLUTION | Freq: Four times a day (QID) | RESPIRATORY_TRACT | Status: DC | PRN

## 2012-08-13 MED ORDER — PREDNISONE 10 MG PO TABS: ORAL_TABLET | ORAL | Status: DC

## 2012-08-13 MED ORDER — DOXYCYCLINE HYCLATE 100 MG PO TABS: 100.0000 mg | ORAL_TABLET | Freq: Two times a day (BID) | ORAL | Status: DC

## 2012-08-13 MED ORDER — OXYCODONE HCL 5 MG PO TABS
5.0000 mg | ORAL_TABLET | Freq: Once | ORAL | Status: AC
  Administered 2012-08-13: 5 mg via ORAL
  Filled 2012-08-13: qty 1

## 2012-08-13 NOTE — Discharge Summary (Signed)
Physician Discharge Summary  [name]:[id number] DOB: 10/16/1961 DOA: 08/11/2012  PCP: Dwana Melena, MD  Admit date: 08/11/2012 Discharge date: 08/13/2012  Recommendations for Outpatient Follow-up:  1. Followup with PCP in 1 week.  Discharge Diagnoses:  Principal Problem:  *COPD exacerbation Active Problems:  DM (diabetes mellitus)  Leucocytosis  Hypertension   Discharge Condition: Stable.  Diet recommendation: Diabetic diet.  Filed Weights   08/11/12 1217 08/11/12 2055 08/13/12 0626  Weight: 68.04 kg (150 lb) 68.947 kg (152 lb) 68.675 kg (151 lb 6.4 oz)    History of present illness:  [name] is a 51 y.o. female with h/o smoking (quit 2 weeks ago), 2 episodes of pneumonia (last one couple of years ago but no flu, takes flu vaccine every year), DM, cardiac w/u including ST and cath, CHF (on fluid pill). Who presented on 105/2013 with shortness of breath.   Hospital Course:  Acute respiratory failure/shortness of breath secondary to COPD exacerbation with bronchitis with pleuritic chest pain, sputum production (but clear chest xray) and history of smoking  D-dimer negative. Troponin negative x3. Transitioned Solu-Medrol to prednisone, taper steroids after discharge. Continue doxycycline started on 08/11/2012 for 5 more days to complete 7 day course. Likely will benefit from outpatient pulmonary function tests for baseline lung function.   Tobacco abuse  Patient counseled on smoking cessation. Patient declined nicotine patch.   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 Sliding scale insulin. Hemoglobin A1c 7.1 on 08/12/2012 suggesting an average blood sugar of 156.   Uncontrolled HTN could be due to COPD  Improved. Continue lisinopril.   Leucocytosis  Could be from bronchitis and/or steroid use.   Generalized weakness  No PT needs.   Hypothyroidism  Continue levothyroxine.  Procedures:  As  above.  Consultations:  None  Discharge Exam: Filed Vitals:   08/12/12 2052 08/12/12 2242 08/13/12 0626 08/13/12 0846  BP:  118/69 115/79   Pulse:  94 89   Temp:  98 F (36.7 C) 97.7 F (36.5 C)   TempSrc:  Oral Oral   Resp:  16 16   Height:      Weight:   68.675 kg (151 lb 6.4 oz)   SpO2: 95% 97% 97% 100%    Discharge Instructions  Discharge Orders    Future Orders Please Complete By Expires   Diet Carb Modified      Increase activity slowly      Discharge instructions      Comments:   Followup with HALL,ZACK, MD (PCP) in 1 week.       Medication List     As of 08/13/2012  9:57 AM    STOP taking these medications         azithromycin 250 MG tablet   Commonly known as: ZITHROMAX      permethrin 5 % cream   Commonly known as: ELIMITE      TAKE these medications         albuterol 108 (90 BASE) MCG/ACT inhaler   Commonly known as: PROVENTIL HFA;VENTOLIN HFA   Inhale 2 puffs into the lungs every 6 (six) hours as needed. Shortness of breath.      albuterol (5 MG/ML) 0.5% nebulizer solution   Commonly known as: PROVENTIL   Take 0.5 mLs (2.5 mg total) by nebulization every 6 (six) hours as needed for wheezing.      ALPRAZolam 0.5 MG tablet   Commonly known as: XANAX   Take 1-2 tablets  by mouth Twice daily. 1 tablet in the morning and 2 tablets at bedtime.      citalopram 40 MG tablet   Commonly known as: CELEXA   Take 40 mg by mouth daily.      cyclobenzaprine 10 MG tablet   Commonly known as: FLEXERIL   Take 1 tablet by mouth Every 6 hours as needed. For muscle spasm.      doxycycline 100 MG tablet   Commonly known as: VIBRA-TABS   Take 1 tablet (100 mg total) by mouth every 12 (twelve) hours.      Fluticasone-Salmeterol 250-50 MCG/DOSE Aepb   Commonly known as: ADVAIR   Inhale 1 puff into the lungs every 12 (twelve) hours.      furosemide 20 MG tablet   Commonly known as: LASIX   Take 20 mg by mouth daily.      levothyroxine 125 MCG tablet    Commonly known as: SYNTHROID, LEVOTHROID   Take 1 tablet (125 mcg total) by mouth See admin instructions. Take 1 tablet (125 mcg total) by mouth Once per day on Sun Tue Wed Fri Sat.      levothyroxine 125 MCG tablet   Commonly known as: SYNTHROID, LEVOTHROID   Take 1.5 tablets (187.5 mcg total) by mouth See admin instructions. Take 1.5 tablets (187.5 mcg total) by mouth Once per day on Mon Thu.      lisinopril 20 MG tablet   Commonly known as: PRINIVIL,ZESTRIL   Take 20 mg by mouth daily.      metFORMIN 500 MG tablet   Commonly known as: GLUCOPHAGE   Take 500 mg by mouth 2 (two) times daily with a meal.      oxyCODONE-acetaminophen 10-325 MG per tablet   Commonly known as: PERCOCET   Take 1 tablet by mouth 4 times daily.      predniSONE 10 MG tablet   Commonly known as: DELTASONE   Take 40 mg daily for 1 day, then 20 mg daily for 2 days, then 10 mg daily for 2 days, then 5 mg daily for 2 days then discontinue          The results of significant diagnostics from this hospitalization (including imaging, microbiology, ancillary and laboratory) are listed below for reference.    Significant Diagnostic Studies: Dg Chest 2 View  08/11/2012  *RADIOLOGY REPORT*  Clinical Data: Shortness of breath with cough and congestion. History of diabetes and hypertension.  CHEST - 2 VIEW  Comparison: 08/06/2012 and 03/11/2011.  Findings: The heart size and mediastinal contours are normal. The lungs are clear. There is no pleural effusion or pneumothorax. No acute osseous findings are identified.  IMPRESSION: Stable examination.  No active cardiopulmonary process.   Original Report Authenticated By: Gerrianne Scale, M.D.    Dg Chest 2 View  08/06/2012  *RADIOLOGY REPORT*  Clinical Data: Cough.  CHEST - 2 VIEW  Comparison: October 19, 2011.  Findings: Cardiomediastinal silhouette appears normal.  The pulmonary disease is noted.  Bony thorax is intact.  IMPRESSION: No acute cardiopulmonary abnormality  seen.   Original Report Authenticated By: Venita Sheffield., M.D.     Microbiology: No results found for this or any previous visit (from the past 240 hour(s)).   Labs: Basic Metabolic Panel:  Lab 08/13/12 4540 08/11/12 1822  NA 137 142  K 4.2 3.6  CL 102 102  CO2 25 --  GLUCOSE 167* 325*  BUN 14 11  CREATININE 0.61 0.90  CALCIUM 9.7 --  MG -- --  PHOS -- --   Liver Function Tests: No results found for this basename: AST:5,ALT:5,ALKPHOS:5,BILITOT:5,PROT:5,ALBUMIN:5 in the last 168 hours No results found for this basename: LIPASE:5,AMYLASE:5 in the last 168 hours No results found for this basename: AMMONIA:5 in the last 168 hours CBC:  Lab 08/13/12 0605 08/12/12 0202 08/11/12 1822 08/11/12 1425  WBC 14.2* 12.9* -- 12.3*  NEUTROABS -- 11.1* -- 8.3*  HGB 12.7 12.5 13.9 14.3  HCT 38.1 37.1 41.0 41.6  MCV 89.6 88.8 -- 87.8  PLT 263 269 -- 290   Cardiac Enzymes:  Lab 08/12/12 0946 08/12/12 0202 08/11/12 2204  CKTOTAL -- -- --  CKMB -- -- --  CKMBINDEX -- -- --  TROPONINI <0.30 <0.30 <0.30   BNP: BNP (last 3 results)  Basename 08/11/12 2204  PROBNP 40.4   CBG:  Lab 08/13/12 0612 08/12/12 2329 08/12/12 1555 08/12/12 1107 08/12/12 0401  GLUCAP 164* 266* 309* 132* 145*    Time coordinating discharge: 25 minutes  Signed:  Shiniqua Groseclose A  [facility] 08/13/2012, 9:57 AM

## 2012-08-13 NOTE — Progress Notes (Signed)
PT Cancellation Note  Patient Details Name: [name] MRN: [id number] DOB: 1961/05/25   Cancelled Treatment:     Pt d/cing home.     Verdell Face, [place] [phone] 08/13/2012

## 2012-08-13 NOTE — Progress Notes (Signed)
Ambulated Pt 226ft O2 93-95% on RA

## 2012-08-13 NOTE — Progress Notes (Signed)
Pt d/c to home/  D/c instructions and medications review. Pt states understanding, All Pt questions answered

## 2012-08-13 NOTE — Progress Notes (Signed)
Subjective: Breathing improved. No specific complaints.  Objective: Vital signs in last 24 hours: Filed Vitals:   08/12/12 2052 08/12/12 2242 08/13/12 0626 08/13/12 0846  BP:  118/69 115/79   Pulse:  94 89   Temp:  98 F (36.7 C) 97.7 F (36.5 C)   TempSrc:  Oral Oral   Resp:  16 16   Height:      Weight:   68.675 kg (151 lb 6.4 oz)   SpO2: 95% 97% 97% 100%   Weight change: 0.635 kg (1 lb 6.4 oz)  Intake/Output Summary (Last 24 hours) at 08/13/12 0942 Last data filed at 08/13/12 0828  Gross per 24 hour  Intake   1090 ml  Output   2850 ml  Net  -1760 ml    Physical Exam: General: Awake, Oriented, No acute distress. HEENT: EOMI. Neck: Supple CV: S1 and S2 Lungs: CTAB. Abdomen: Soft, Nontender, Nondistended, +bowel sounds. Ext: Good pulses. Trace edema.  Lab Results: Basic Metabolic Panel:  Lab 08/13/12 8295 08/11/12 1822  NA 137 142  K 4.2 3.6  CL 102 102  CO2 25 --  GLUCOSE 167* 325*  BUN 14 11  CREATININE 0.61 0.90  CALCIUM 9.7 --  MG -- --  PHOS -- --   Liver Function Tests: No results found for this basename: AST:5,ALT:5,ALKPHOS:5,BILITOT:5,PROT:5,ALBUMIN:5 in the last 168 hours No results found for this basename: LIPASE:5,AMYLASE:5 in the last 168 hours No results found for this basename: AMMONIA:5 in the last 168 hours CBC:  Lab 08/13/12 0605 08/12/12 0202 08/11/12 1822 08/11/12 1425  WBC 14.2* 12.9* -- 12.3*  NEUTROABS -- 11.1* -- 8.3*  HGB 12.7 12.5 13.9 14.3  HCT 38.1 37.1 41.0 41.6  MCV 89.6 88.8 -- 87.8  PLT 263 269 -- 290   Cardiac Enzymes:  Lab 08/12/12 0946 08/12/12 0202 08/11/12 2204  CKTOTAL -- -- --  CKMB -- -- --  CKMBINDEX -- -- --  TROPONINI <0.30 <0.30 <0.30   BNP (last 3 results)  Basename 08/11/12 2204  PROBNP 40.4   CBG:  Lab 08/13/12 0612 08/12/12 2329 08/12/12 1555 08/12/12 1107 08/12/12 0401  GLUCAP 164* 266* 309* 132* 145*    Basename 08/12/12 0202  HGBA1C 7.1*   Other Labs: No components found with  this basename: POCBNP:3  Lab 08/11/12 2205  DDIMER <0.27   No results found for this basename: CHOL:2,HDL:2,LDLCALC:2,TRIG:2,CHOLHDL:2,LDLDIRECT:2 in the last 168 hours No results found for this basename: TSH,T4TOTAL,FREET3,T3FREE,FREET4,THYROIDAB in the last 168 hours No results found for this basename: VITAMINB12:2,FOLATE:2,FERRITIN:2,TIBC:2,IRON:2,RETICCTPCT:2 in the last 168 hours  Micro Results: No results found for this or any previous visit (from the past 240 hour(s)).  Studies/Results: Dg Chest 2 View  08/11/2012  *RADIOLOGY REPORT*  Clinical Data: Shortness of breath with cough and congestion. History of diabetes and hypertension.  CHEST - 2 VIEW  Comparison: 08/06/2012 and 03/11/2011.  Findings: The heart size and mediastinal contours are normal. The lungs are clear. There is no pleural effusion or pneumothorax. No acute osseous findings are identified.  IMPRESSION: Stable examination.  No active cardiopulmonary process.   Original Report Authenticated By: Gerrianne Scale, M.D.     Medications: I have reviewed the patient's current medications. Scheduled Meds:    . albuterol  2.5 mg Nebulization Q6H  . ALPRAZolam  0.5 mg Oral q morning - 10a  . ALPRAZolam  1 mg Oral QHS  . citalopram  40 mg Oral Daily  . doxycycline  100 mg Oral Q12H  . Fluticasone-Salmeterol  1  puff Inhalation Q12H  . influenza  inactive virus vaccine  0.5 mL Intramuscular Tomorrow-1000  . insulin aspart  0-9 Units Subcutaneous TID WC  . ipratropium  0.5 mg Nebulization Q6H  . levothyroxine  125 mcg Oral Custom  . levothyroxine  187.5 mcg Oral Custom  . lisinopril  20 mg Oral Daily  . metFORMIN  500 mg Oral BID WC  . methylPREDNISolone (SOLU-MEDROL) injection  40 mg Intravenous Q6H  . pneumococcal 23 valent vaccine  0.5 mL Intramuscular Tomorrow-1000  . predniSONE  40 mg Oral Q breakfast  . DISCONTD: insulin aspart  0-24 Units Subcutaneous Q4H  . DISCONTD: predniSONE  40 mg Oral Q breakfast  .  DISCONTD: tiotropium  18 mcg Inhalation Daily   Continuous Infusions:    . sodium chloride     PRN Meds:.albuterol, albuterol, cyclobenzaprine, ipratropium, oxyCODONE, oxyCODONE-acetaminophen  Assessment/Plan: Acute respiratory failure/shortness of breath secondary to COPD exacerbation with bronchitis with pleuritic chest pain, sputum production (but clear chest xray) and history of smoking D-dimer negative.  Troponin negative x3.  Transitioned Solu-Medrol to prednisone, taper steroids after discharge.  Continue doxycycline started on 08/11/2012.  Likely will benefit from outpatient pulmonary function tests for baseline lung function.  Tobacco abuse Patient counseled on smoking cessation.  Patient declined nicotine patch.  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  Sliding scale insulin.  Hemoglobin A1c 7.1 on 08/12/2012 suggesting an average blood sugar of 156.   Uncontrolled HTN could be due to COPD Improved.  Continue lisinopril.  Leucocytosis Could be from bronchitis and/or steroid use.  Generalized weakness No PT needs.  Hypothyroidism Continue levothyroxine.  Prophylaxis SCDs.  Disposition Discharge patient today.   LOS: 2 days  Charleen Madera A, MD 08/13/2012, 9:42 AM

## 2012-08-14 NOTE — Care Management Note (Signed)
    Page 1 of 1   08/14/2012     2:26:07 PM   CARE MANAGEMENT NOTE 08/14/2012  Patient:  [name], [name]   Account Number:  [id number]  Date Initiated:  08/13/2012  Documentation initiated by:  Tera Mater  Subjective/Objective Assessment:   51yo female admitted with COPD exacerbation     Action/Plan:   Discharge planning   Anticipated DC Date:  08/13/2012   Anticipated DC Plan:  HOME/SELF CARE      DC Planning Services  CM consult      PAC Choice  DURABLE MEDICAL EQUIPMENT   Choice offered to / List presented to:     DME arranged  NEBULIZER MACHINE      DME agency  [facility]        Status of service:  Completed, signed off Medicare Important Message given?   (If response is "NO", the following Medicare IM given date fields will be blank) Date Medicare IM given:   Date Additional Medicare IM given:    Discharge Disposition:  HOME/SELF CARE  Per UR Regulation:  Reviewed for med. necessity/level of care/duration of stay  If discussed at Long Length of Stay Meetings, dates discussed:    Comments:  08/13/12 1000 Tera Mater, RN, BSN NCM [phone] TC to Montez Morita, with [facility] to give referral for DME nebulizer machine.  Pt. to dc home today.

## 2012-08-14 NOTE — ED Provider Notes (Signed)
Medical screening examination/treatment/procedure(s) were conducted as a shared visit with non-physician practitioner(s) and myself.  I personally evaluated the patient during the encounter  Hurman Horn, MD 08/14/12 [phone]

## 2012-09-05 ENCOUNTER — Other Ambulatory Visit (HOSPITAL_COMMUNITY): Payer: Self-pay | Admitting: Internal Medicine

## 2012-09-05 DIAGNOSIS — Z139 Encounter for screening, unspecified: Secondary | ICD-10-CM

## 2012-09-17 ENCOUNTER — Ambulatory Visit (HOSPITAL_COMMUNITY): Payer: 59

## 2012-09-21 ENCOUNTER — Emergency Department (HOSPITAL_COMMUNITY): Payer: 59

## 2012-09-21 ENCOUNTER — Observation Stay (HOSPITAL_COMMUNITY)
Admission: EM | Admit: 2012-09-21 | Discharge: 2012-09-21 | Disposition: A | Discharge status: Home / Self Care | Payer: 59 | Attending: Emergency Medicine | Admitting: Emergency Medicine

## 2012-09-21 ENCOUNTER — Encounter (HOSPITAL_COMMUNITY): Payer: Self-pay | Admitting: Cardiology

## 2012-09-21 DIAGNOSIS — E1149 Type 2 diabetes mellitus with other diabetic neurological complication: Secondary | ICD-10-CM | POA: Insufficient documentation

## 2012-09-21 DIAGNOSIS — M549 Dorsalgia, unspecified: Secondary | ICD-10-CM

## 2012-09-21 DIAGNOSIS — J4489 Other specified chronic obstructive pulmonary disease: Secondary | ICD-10-CM | POA: Insufficient documentation

## 2012-09-21 DIAGNOSIS — Z79899 Other long term (current) drug therapy: Secondary | ICD-10-CM | POA: Insufficient documentation

## 2012-09-21 DIAGNOSIS — G35 Multiple sclerosis: Secondary | ICD-10-CM | POA: Insufficient documentation

## 2012-09-21 DIAGNOSIS — Z9181 History of falling: Secondary | ICD-10-CM | POA: Insufficient documentation

## 2012-09-21 DIAGNOSIS — I10 Essential (primary) hypertension: Secondary | ICD-10-CM | POA: Insufficient documentation

## 2012-09-21 DIAGNOSIS — E1142 Type 2 diabetes mellitus with diabetic polyneuropathy: Secondary | ICD-10-CM | POA: Insufficient documentation

## 2012-09-21 DIAGNOSIS — G8929 Other chronic pain: Principal | ICD-10-CM | POA: Insufficient documentation

## 2012-09-21 DIAGNOSIS — J449 Chronic obstructive pulmonary disease, unspecified: Secondary | ICD-10-CM | POA: Insufficient documentation

## 2012-09-21 DIAGNOSIS — K219 Gastro-esophageal reflux disease without esophagitis: Secondary | ICD-10-CM | POA: Insufficient documentation

## 2012-09-21 MED ORDER — DIAZEPAM 5 MG PO TABS
5.0000 mg | ORAL_TABLET | Freq: Once | ORAL | Status: AC
  Administered 2012-09-21: 5 mg via ORAL
  Filled 2012-09-21: qty 1

## 2012-09-21 MED ORDER — METHOCARBAMOL 100 MG/ML IJ SOLN: 1000.0000 mg | Freq: Four times a day (QID) | INTRAMUSCULAR | Status: DC

## 2012-09-21 MED ORDER — OXYCODONE-ACETAMINOPHEN 5-325 MG PO TABS
2.0000 | ORAL_TABLET | Freq: Once | ORAL | Status: AC
  Administered 2012-09-21: 2 via ORAL
  Filled 2012-09-21: qty 2

## 2012-09-21 MED ORDER — HYDROMORPHONE HCL PF 1 MG/ML IJ SOLN
1.0000 mg | Freq: Once | INTRAMUSCULAR | Status: AC
  Administered 2012-09-21: 1 mg via INTRAVENOUS
  Filled 2012-09-21: qty 1

## 2012-09-21 MED ORDER — HYDROMORPHONE HCL PF 1 MG/ML IJ SOLN
1.0000 mg | INTRAMUSCULAR | Status: DC | PRN
  Administered 2012-09-21: 1 mg via INTRAVENOUS
  Filled 2012-09-21: qty 1

## 2012-09-21 MED ORDER — ONDANSETRON HCL 4 MG/2ML IJ SOLN: 4.0000 mg | Freq: Four times a day (QID) | INTRAMUSCULAR | Status: DC | PRN

## 2012-09-21 MED ORDER — ONDANSETRON HCL 4 MG/2ML IJ SOLN
4.0000 mg | Freq: Once | INTRAMUSCULAR | Status: AC
  Administered 2012-09-21: 4 mg via INTRAVENOUS
  Filled 2012-09-21: qty 2

## 2012-09-21 MED ORDER — HYDROMORPHONE HCL PF 1 MG/ML IJ SOLN: 1.0000 mg | Freq: Four times a day (QID) | INTRAMUSCULAR | Status: DC | PRN

## 2012-09-21 MED ORDER — METHOCARBAMOL 100 MG/ML IJ SOLN
500.0000 mg | Freq: Three times a day (TID) | INTRAVENOUS | Status: DC
  Administered 2012-09-21: 500 mg via INTRAVENOUS
  Filled 2012-09-21 (×3): qty 5

## 2012-09-21 NOTE — ED Provider Notes (Signed)
Medical screening examination/treatment/procedure(s) were conducted as a shared visit with non-physician practitioner(s) and myself.  I personally evaluated the patient during the encounter 51 yo woman with chronic back pain, on pain management with Dr. Gerilyn Pilgrim in [employer], had fall onto concrete recently, with worsening of her pain. She feels pain in the lower lumbar region with radiation to the right leg. She is in considerable pain. She localizes pain Over the lower lumbar region, with radiation into the right leg. There is no sensory or motor deficit. Will x-ray LS spine where she had a recent fall, and then plan to call Dr. Cassandria Santee office to see if she can get an appointment sooner than December 2.       Carleene Cooper III, MD 09/21/12 [phone]

## 2012-09-21 NOTE — Progress Notes (Signed)
Utilization review completed.  P.J. Shacoria Latif,RN,BSN Case Manager [phone]  

## 2012-09-21 NOTE — ED Provider Notes (Signed)
Patient in CDU for back pain protocol. Given a few rounds of dilaudid, will reassess. Talked to Dr. Ignacia Palma who will make call to get patient a sooner appointment with her chronic pain specialist. Appointment made with Dr. Phoebe Perch who will see the patient on 11.18.13. Patient will remain in CDU on back pain protocol. Care signed out to [facility], [place].  Pixie Casino, PA-C 09/21/12 1612

## 2012-09-21 NOTE — ED Notes (Signed)
Pt in X ray

## 2012-09-21 NOTE — ED Notes (Signed)
Pt reports a hx of chronic back pain that became worse over the past couple of days. Reports she has an appt with neurosurgeon but needs something til she can get to that appt on dec 2nd.

## 2012-09-21 NOTE — ED Notes (Signed)
CALLED PHARMACY TO CHECK ON DELAY IN GETTING PT ROBAXIN

## 2012-09-21 NOTE — ED Provider Notes (Signed)
History     [name]: [id number]  Arrival date & time 09/21/12  4098   First MD Initiated Contact with Patient 09/21/12 [phone]      Chief Complaint  Patient presents with  . Back Pain    (Consider location/radiation/quality/duration/timing/severity/associated sxs/prior treatment) HPI Comments: This is a 51 year old female, who presents to the emergency department with chief complaint of chronic back pain. She states that the pain has been ongoing for the past several years, and but that it is worsening in nature. She states that it has particularly worsened over the past couple of days. Patient states that she has a followup appointment with Dr. Jeral Fruit on December 2, but that she does not think she can wait that long. She states that she has had 2 falls over the past 2 weeks, as a result of her pain. She is able to ambulate, but with pain. She denies bowel or bladder incontinence, and saddle anesthesia. She states that she is now having radiating symptoms into her legs. She states she is in 10 out of 10 pain.  The history is provided by the patient. No language interpreter was used.    Past Medical History  Diagnosis Date  . GERD (gastroesophageal reflux disease)   . Gastritis   . Fibromyalgia   . HTN (hypertension)   . Edema of lower extremity   . Kidney stone   . Fibromyalgia   . Migraine   . DDD (degenerative disc disease)   . MS (multiple sclerosis)   . Diabetes mellitus   . Neuropathy   . Asthma   . CHF (congestive heart failure)   . COPD (chronic obstructive pulmonary disease)   . Complication of anesthesia     Past Surgical History  Procedure Date  . S/p hysterectomy   . Hiatal herina   . Fundic gland polyp     benign  . Sigmoidoscopy 01/31/08    large internal hemorrhoids/small rectal polyp removed/rare sigmoid diverticula  . Esophagogastroduodenoscopy 10/29/07    normal  . Cholecystectomy   . Bladder surgery   . Abdominal hysterectomy   . Ddd     History  reviewed. No pertinent family history.  History  Substance Use Topics  . Smoking status: Former Games developer  . Smokeless tobacco: Former Neurosurgeon    Quit date: 07/07/2012  . Alcohol Use: No    OB History    Grav Para Term Preterm Abortions TAB SAB Ect Mult Living                  Review of Systems  All other systems reviewed and are negative.    Allergies  Demerol; Doxycycline; Penicillins; and Sulfa antibiotics  Home Medications   Current Outpatient Rx  Name  Route  Sig  Dispense  Refill  . ALBUTEROL SULFATE HFA 108 (90 BASE) MCG/ACT IN AERS   Inhalation   Inhale 2 puffs into the lungs every 6 (six) hours as needed. Shortness of breath.         . ALBUTEROL SULFATE (5 MG/ML) 0.5% IN NEBU   Nebulization   Take 0.5 mLs (2.5 mg total) by nebulization every 6 (six) hours as needed for wheezing.   20 mL   0   . ALPRAZOLAM 0.5 MG PO TABS   Oral   Take 1-2 tablets by mouth Twice daily. 1 tablet in the morning and 2 tablets at bedtime.         Marland Kitchen CITALOPRAM HYDROBROMIDE 40 MG PO TABS   Oral  Take 40 mg by mouth daily.         . CYCLOBENZAPRINE HCL 10 MG PO TABS   Oral   Take 10 mg by mouth Every 6 hours as needed. For muscle spasm.         Marland Kitchen FLUTICASONE-SALMETEROL 250-50 MCG/DOSE IN AEPB   Inhalation   Inhale 1 puff into the lungs every 12 (twelve) hours.         . FUROSEMIDE 20 MG PO TABS   Oral   Take 20 mg by mouth daily.         Marland Kitchen LEVOTHYROXINE SODIUM 125 MCG PO TABS   Oral   Take 125-187.5 mcg by mouth See admin instructions. Take 1 tablet (125 mcg total) on Sun Tue Wed Fri Sat. 1.5 tabs (187.58mcg) on Monday and Thursday         . LISINOPRIL 20 MG PO TABS   Oral   Take 20 mg by mouth daily.         Marland Kitchen METFORMIN HCL 500 MG PO TABS   Oral   Take 500 mg by mouth 2 (two) times daily with a meal.         . OXYCODONE-ACETAMINOPHEN 10-325 MG PO TABS   Oral   Take 1 tablet by mouth 4 times daily.           BP 141/98  Pulse 100  Temp 98.3  F (36.8 C) (Oral)  Resp 18  SpO2 100%  Physical Exam  Nursing note and vitals reviewed. Constitutional: She is oriented to person, place, and time. She appears well-developed and well-nourished.  HENT:  Head: Normocephalic and atraumatic.  Eyes: Conjunctivae normal and EOM are normal. Pupils are equal, round, and reactive to light.  Neck: Normal range of motion. Neck supple.  Cardiovascular: Normal rate and regular rhythm.  Exam reveals no gallop and no friction rub.   No murmur heard. Pulmonary/Chest: Effort normal and breath sounds normal. No respiratory distress. She has no wheezes. She has no rales. She exhibits no tenderness.  Abdominal: Soft. Bowel sounds are normal. She exhibits no distension and no mass. There is no tenderness. There is no rebound and no guarding.  Musculoskeletal: Normal range of motion. She exhibits tenderness. She exhibits no edema.       Pain to palpation over lumbar intervertebral disc spaces, paraspinal muscles are very tight, and tender to palpation. Limited range of motion, secondary to pain in the lumbar spine. Strength and sensation intact bilaterally  Neurological: She is alert and oriented to person, place, and time. She has normal reflexes.       Patella and Achilles reflexes are 2+ bilaterally  Skin: Skin is warm and dry.  Psychiatric: She has a normal mood and affect. Her behavior is normal. Judgment and thought content normal.    ED Course  Procedures (including critical care time)  Labs Reviewed - No data to display No results found.   No diagnosis found.    MDM  51 year old female with back pain.  I have discussed this patient with Dr. Ignacia Palma.  Patient is seeing a pain specialist, for which she is receiving Percocet.  She states that she has taken some today, but is not having any relief.  I believe her pain to be real.  I am going to treat her pain with 2 of dilaudid and zofran in the ED, per Dr. Jacques Navy advice.  10:43  AM Patient reevaluated, and states that her pain is improved but is still  not resolved.  10:48 AM Dr. Ignacia Palma evaluated the patient personally.  He tells me to reorder dilaudid, and obtain lumbar xrays.  Patient transferred to CDU and placed on back pain protocol. Signout given to [employer], PA-C.  Dr. Ignacia Palma arranged for f/u with Dr. Phoebe Perch on Monday morning.         Roxy Horseman, PA-C 09/21/12 1523

## 2012-09-21 NOTE — ED Notes (Signed)
PATIENT STATES SAW HER PAIN CLINIC DOCTOR LAST Friday AND RECEIVED HER REFILLS ON HER PERCOCET 10S AND HER XANAX. STATES HER PMD PRESCRIBES HER OTHER MEDS INCLUDING HER FLEXERIL. STATES SHE HAS HER FIRST APPT WITH DR Jeral Fruit ON December 2.

## 2012-09-21 NOTE — ED Provider Notes (Signed)
Medical screening examination/treatment/procedure(s) were conducted as a shared visit with non-physician practitioner(s) and myself.  I personally evaluated the patient during the encounter Pt gotten appointment with Dr. Phoebe Perch on next Monday, November19, 2013.   Carleene Cooper III, MD 09/21/12 [phone]

## 2012-09-21 NOTE — ED Provider Notes (Signed)
4:44 PM Patient is in CDU under observation, back pain protocol.  This is a shared visit with Dr Ignacia Palma.  Sign out received from [employer], PA-C, and [employer], [place].  Patient with chronic back pain.  Plan is for follow up appointment with Dr Phoebe Perch on Monday morning at 10:15.  I have spoken with the patient and her spouse.  Patient reports pain was initially 12 out of 12 and is now 9 out of 12.  Spouse requests discharge to get patient home and settled for the weekend.  Patient is on pain management and has percocet at home.  I have discussed with her that I will be unable to prescribe her any medication given her pain contract that she signed.  Pt verbalizes understanding and agrees with plan.  Plan is for final dose of medication and discharge.  Return precautions given.    [place], [place] 09/21/12 2352

## 2012-09-21 NOTE — Progress Notes (Signed)
51 yo woman with chronic back pain, on pain management with Dr. Gerilyn Pilgrim in [place], had fall onto concrete recently, with worsening of her pain.  She feels pain in the lower lumbar region with radiation to the right leg.  She is in considerable pain.  She localizes pain  Over the lower lumbar region, with radiation into the right leg.  There is no sensory or motor deficit.  Will x-ray LS spine where she had a recent fall, and then plan to call Dr. Cassandria Santee office to see if she can get an appointment sooner than December 2.

## 2012-09-22 NOTE — ED Provider Notes (Signed)
Medical screening examination/treatment/procedure(s) were conducted as a shared visit with non-physician practitioner(s) and myself.  I personally evaluated the patient during the encounter See my prior note.  Carleene Cooper III, MD 09/22/12 [phone]

## 2012-12-28 ENCOUNTER — Emergency Department (HOSPITAL_COMMUNITY)
Admission: EM | Admit: 2012-12-28 | Discharge: 2012-12-28 | Disposition: A | Discharge status: Home / Self Care | Payer: 59 | Attending: Emergency Medicine | Admitting: Emergency Medicine

## 2012-12-28 ENCOUNTER — Encounter (HOSPITAL_COMMUNITY): Payer: Self-pay | Admitting: *Deleted

## 2012-12-28 ENCOUNTER — Emergency Department (HOSPITAL_COMMUNITY): Payer: 59

## 2012-12-28 DIAGNOSIS — R0602 Shortness of breath: Secondary | ICD-10-CM | POA: Insufficient documentation

## 2012-12-28 DIAGNOSIS — Z8719 Personal history of other diseases of the digestive system: Secondary | ICD-10-CM | POA: Insufficient documentation

## 2012-12-28 DIAGNOSIS — R079 Chest pain, unspecified: Secondary | ICD-10-CM

## 2012-12-28 DIAGNOSIS — I509 Heart failure, unspecified: Secondary | ICD-10-CM | POA: Insufficient documentation

## 2012-12-28 DIAGNOSIS — R0789 Other chest pain: Secondary | ICD-10-CM | POA: Insufficient documentation

## 2012-12-28 DIAGNOSIS — R110 Nausea: Secondary | ICD-10-CM | POA: Insufficient documentation

## 2012-12-28 DIAGNOSIS — Z79899 Other long term (current) drug therapy: Secondary | ICD-10-CM | POA: Insufficient documentation

## 2012-12-28 DIAGNOSIS — Z87891 Personal history of nicotine dependence: Secondary | ICD-10-CM | POA: Insufficient documentation

## 2012-12-28 DIAGNOSIS — IMO0001 Reserved for inherently not codable concepts without codable children: Secondary | ICD-10-CM | POA: Insufficient documentation

## 2012-12-28 DIAGNOSIS — E1149 Type 2 diabetes mellitus with other diabetic neurological complication: Secondary | ICD-10-CM | POA: Insufficient documentation

## 2012-12-28 DIAGNOSIS — J449 Chronic obstructive pulmonary disease, unspecified: Secondary | ICD-10-CM | POA: Insufficient documentation

## 2012-12-28 DIAGNOSIS — IMO0002 Reserved for concepts with insufficient information to code with codable children: Secondary | ICD-10-CM | POA: Insufficient documentation

## 2012-12-28 DIAGNOSIS — Z87442 Personal history of urinary calculi: Secondary | ICD-10-CM | POA: Insufficient documentation

## 2012-12-28 DIAGNOSIS — J45909 Unspecified asthma, uncomplicated: Secondary | ICD-10-CM | POA: Insufficient documentation

## 2012-12-28 DIAGNOSIS — Z8679 Personal history of other diseases of the circulatory system: Secondary | ICD-10-CM | POA: Insufficient documentation

## 2012-12-28 DIAGNOSIS — J4489 Other specified chronic obstructive pulmonary disease: Secondary | ICD-10-CM | POA: Insufficient documentation

## 2012-12-28 DIAGNOSIS — G35 Multiple sclerosis: Secondary | ICD-10-CM | POA: Insufficient documentation

## 2012-12-28 DIAGNOSIS — I10 Essential (primary) hypertension: Secondary | ICD-10-CM | POA: Insufficient documentation

## 2012-12-28 LAB — CBC
HCT: 43.2 % (ref 36.0–46.0)
MCH: 31.1 pg (ref 26.0–34.0)
MCV: 88.3 fL (ref 78.0–100.0)
Platelets: 269 10*3/uL (ref 150–400)
RBC: 4.89 MIL/uL (ref 3.87–5.11)

## 2012-12-28 LAB — BASIC METABOLIC PANEL
CO2: 31 mEq/L (ref 19–32)
Calcium: 10.1 mg/dL (ref 8.4–10.5)
Creatinine, Ser: 0.67 mg/dL (ref 0.50–1.10)
Glucose, Bld: 165 mg/dL — ABNORMAL HIGH (ref 70–99)

## 2012-12-28 LAB — POCT I-STAT TROPONIN I

## 2012-12-28 MED ORDER — ASPIRIN 325 MG PO TABS
325.0000 mg | ORAL_TABLET | ORAL | Status: AC
  Administered 2012-12-28: 325 mg via ORAL
  Filled 2012-12-28: qty 1

## 2012-12-28 MED ORDER — GI COCKTAIL ~~LOC~~
30.0000 mL | Freq: Once | ORAL | Status: AC
  Administered 2012-12-28: 30 mL via ORAL
  Filled 2012-12-28: qty 30

## 2012-12-28 MED ORDER — HYDROMORPHONE HCL PF 1 MG/ML IJ SOLN
1.0000 mg | Freq: Once | INTRAMUSCULAR | Status: AC
  Administered 2012-12-28: 1 mg via INTRAVENOUS
  Filled 2012-12-28: qty 1

## 2012-12-28 MED ORDER — HYDROMORPHONE HCL PF 1 MG/ML IJ SOLN
0.5000 mg | Freq: Once | INTRAMUSCULAR | Status: AC
  Administered 2012-12-28: 0.5 mg via INTRAVENOUS
  Filled 2012-12-28: qty 1

## 2012-12-28 MED ORDER — NITROGLYCERIN 0.4 MG SL SUBL: 0.4000 mg | SUBLINGUAL_TABLET | SUBLINGUAL | Status: DC | PRN

## 2012-12-28 NOTE — ED Provider Notes (Signed)
History     [name]: [id number]  Arrival date & time 12/28/12  3086   First MD Initiated Contact with Patient 12/28/12 1030      Chief Complaint  Patient presents with  . Chest Pain    (Consider location/radiation/quality/duration/timing/severity/associated sxs/prior treatment) HPI  52 year old female with history of gastritis, fibromyalgia, and COPD, CHF presents complaining of chest pain.  Patient reports she was awoke this morning with sharp stabbing pain to the mid chest, radiates to her left side of neck pain to her left arm. Pain is a sharp stabbing sensation, intermittent, lasting from minutes to hours. She endorses a mild shortness of breath and nausea. She took a Percocet this a.m. which provide mild relief, however the pain is coming back. She has been having active chest pain for the past 2 hours. States she did not eat any spicy food or having heavy meal last night. Patient reports she has a history of hiatal hernia and heartburn but this felt different. She denies any exertional syncope, vomiting, diarrhea, fever, chills, back pain, leg pain, or rash. She is a former smoker. The last time that she had a cardiac catheterization was  >10 years ago and states it was normal.  Does have hx of CHF, currently taking Lasix.  Past Medical History  Diagnosis Date  . GERD (gastroesophageal reflux disease)   . Gastritis   . Fibromyalgia   . HTN (hypertension)   . Edema of lower extremity   . Kidney stone   . Fibromyalgia   . Migraine   . DDD (degenerative disc disease)   . MS (multiple sclerosis)   . Diabetes mellitus   . Neuropathy   . Asthma   . CHF (congestive heart failure)   . COPD (chronic obstructive pulmonary disease)   . Complication of anesthesia     Past Surgical History  Procedure Laterality Date  . S/p hysterectomy    . Hiatal herina    . Fundic gland polyp      benign  . Sigmoidoscopy  01/31/08    large internal hemorrhoids/small rectal polyp removed/rare  sigmoid diverticula  . Esophagogastroduodenoscopy  10/29/07    normal  . Cholecystectomy    . Bladder surgery    . Abdominal hysterectomy    . Ddd      No family history on file.  History  Substance Use Topics  . Smoking status: Former Smoker    Types: Cigarettes  . Smokeless tobacco: Former Neurosurgeon    Quit date: 07/07/2012  . Alcohol Use: No    OB History   Grav Para Term Preterm Abortions TAB SAB Ect Mult Living                  Review of Systems  Constitutional:       10 Systems reviewed and all are negative for acute change except as noted in the HPI.     Allergies  Demerol; Doxycycline; Penicillins; and Sulfa antibiotics  Home Medications   Current Outpatient Rx  Name  Route  Sig  Dispense  Refill  . albuterol (PROVENTIL HFA;VENTOLIN HFA) 108 (90 BASE) MCG/ACT inhaler   Inhalation   Inhale 2 puffs into the lungs every 6 (six) hours as needed. Shortness of breath.         . ALPRAZolam (XANAX) 0.5 MG tablet   Oral   Take 0.5-1 mg by mouth Twice daily. 1 tablet in the morning and 2 tablets at bedtime.         Marland Kitchen  citalopram (CELEXA) 40 MG tablet   Oral   Take 40 mg by mouth daily.         . cyclobenzaprine (FLEXERIL) 10 MG tablet   Oral   Take 10 mg by mouth Every 6 hours as needed for muscle spasms.          . Fluticasone-Salmeterol (ADVAIR) 250-50 MCG/DOSE AEPB   Inhalation   Inhale 1 puff into the lungs 2 (two) times daily as needed (shortness of breath).          Marland Kitchen levothyroxine (SYNTHROID, LEVOTHROID) 125 MCG tablet   Oral   Take 125-187.5 mcg by mouth See admin instructions. Take 1 tablet (125 mcg total) on Sun Tue Wed Fri Sat. 1.5 tabs (187.14mcg) on Monday and Thursday         . lisinopril (PRINIVIL,ZESTRIL) 40 MG tablet   Oral   Take 40 mg by mouth daily.         . metFORMIN (GLUCOPHAGE) 500 MG tablet   Oral   Take 500 mg by mouth 2 (two) times daily with a meal.         . oxyCODONE-acetaminophen (PERCOCET) 10-325 MG per  tablet   Oral   Take 1 tablet by mouth 4 times daily.         Marland Kitchen albuterol (PROVENTIL) (5 MG/ML) 0.5% nebulizer solution   Nebulization   Take 0.5 mLs (2.5 mg total) by nebulization every 6 (six) hours as needed for wheezing.   20 mL   0   . furosemide (LASIX) 20 MG tablet   Oral   Take 20 mg by mouth daily.           BP 148/84  Pulse 86  Temp(Src) 98.3 F (36.8 C) (Oral)  Resp 20  Ht 5\' 6"  (1.676 m)  Wt 143 lb (64.864 kg)  BMI 23.09 kg/m2  SpO2 97%  Physical Exam  Nursing note and vitals reviewed. Constitutional: She appears well-developed and well-nourished. No distress.  Awake, alert, nontoxic appearance  HENT:  Head: Atraumatic.  Eyes: Conjunctivae are normal. Right eye exhibits no discharge. Left eye exhibits no discharge.  Neck: Neck supple.  Cardiovascular: Normal rate and regular rhythm.   Pulmonary/Chest: Effort normal. No respiratory distress. She has no wheezes. She exhibits no tenderness (tenderness to midchest on palpation without overlying skin changes, crepitus, or deformity noted.  ).  Abdominal: Soft. There is no tenderness. There is no rebound.  Musculoskeletal: She exhibits no edema and no tenderness.  ROM appears intact, no obvious focal weakness  Neurological: She is alert.  Mental status and motor strength appears intact  Skin: No rash noted.  Psychiatric: She has a normal mood and affect.    ED Course  Procedures (including critical care time)  Labs Reviewed  PRO B NATRIURETIC PEPTIDE  BASIC METABOLIC PANEL  CBC   Results for orders placed during the hospital encounter of 12/28/12  PRO B NATRIURETIC PEPTIDE      Result Value Range   Pro B Natriuretic peptide (BNP) 97.2  0 - 125 pg/mL  BASIC METABOLIC PANEL      Result Value Range   Sodium 140  135 - 145 mEq/L   Potassium 3.7  3.5 - 5.1 mEq/L   Chloride 101  96 - 112 mEq/L   CO2 31  19 - 32 mEq/L   Glucose, Bld 165 (*) 70 - 99 mg/dL   BUN 7  6 - 23 mg/dL   Creatinine, Ser 1.61   0.50 -  1.10 mg/dL   Calcium 95.6  8.4 - 21.3 mg/dL   GFR calc non Af Amer >90  >90 mL/min   GFR calc Af Amer >90  >90 mL/min  CBC      Result Value Range   WBC 8.8  4.0 - 10.5 K/uL   RBC 4.89  3.87 - 5.11 MIL/uL   Hemoglobin 15.2 (*) 12.0 - 15.0 g/dL   HCT 08.6  57.8 - 46.9 %   MCV 88.3  78.0 - 100.0 fL   MCH 31.1  26.0 - 34.0 pg   MCHC 35.2  30.0 - 36.0 g/dL   RDW 62.9  52.8 - 41.3 %   Platelets 269  150 - 400 K/uL  POCT I-STAT TROPONIN I      Result Value Range   Troponin i, poc 0.06  0.00 - 0.08 ng/mL   Comment 3           POCT I-STAT TROPONIN I      Result Value Range   Troponin i, poc 0.06  0.00 - 0.08 ng/mL   Comment 3            Dg Chest 2 View  12/28/2012  *RADIOLOGY REPORT*  Clinical Data: Chest pain.  Short of breath.  Dizziness.  CHEST - 2 VIEW  Comparison: 08/11/2012.  Findings:  Cardiopericardial silhouette within normal limits. Mediastinal contours normal. Trachea midline.  No airspace disease or effusion. Cholecystectomy clips are present in the right upper quadrant.  IMPRESSION: No active cardiopulmonary disease.   Original Report Authenticated By: Andreas Newport, M.D.      10:47 AM Patient was seen and evaluate by me for her atypical chest pain. The pain does not suggest cardiac etiology she does have significant risk factors. Pain is reproducible on exam. She is afebrile, normal heart rate, normal oxygenation.  Work up initiated, pain medication given.    12:08 PM Patient reports minimal relief with morphine and GI cocktail. Since patient has a history of chronic pain, we'll give Dilaudid for pain management. No significant changes in EKG, first set of troponin is unremarkable, will order delta  troponin for comparison.  Care discussed with attending, who agrees with plan.    2:40 PM Pt has no significant changes in her delta troponin.  Her pain is noncardiac in nature.  Her work up has been unremarkable.  I recommend pt to f/u closely with her PCP for further  management.    BP 138/74  Pulse 69  Temp(Src) 99.1 F (37.3 C) (Oral)  Resp 15  Ht 5\' 6"  (1.676 m)  Wt 143 lb (64.864 kg)  BMI 23.09 kg/m2  SpO2 98%  I have reviewed nursing notes and vital signs. I personally reviewed the imaging tests through PACS system  I reviewed available ER/hospitalization records thought the EMR  1. Chest pain MDM          Fayrene Helper, PA-C 12/28/12 1442

## 2012-12-28 NOTE — ED Notes (Signed)
Fayrene Helper, PA notified about pts pain level and status of troponin. Fayrene Helper, PA at bedside with pt

## 2012-12-28 NOTE — ED Notes (Signed)
Pt ambulatory leaving ED; pt given d/c teaching and follow up care instructions; pt states decrease in pain upon d/c. Pt has no further questions upon d/c. Pt does not show signs of acute distress upon d/c. Pt alert and mentating appropriately. Pt has been instructed not to drive and is being taken home by family. Pt leaving with d/c teaching.

## 2012-12-28 NOTE — ED Notes (Signed)
Pt here with epigastric pain.  Pt states that pain radiates to left arm and has been associated with sob and nausea.  Pain increases with palpation and movement.

## 2012-12-28 NOTE — ED Provider Notes (Signed)
Medical screening examination/treatment/procedure(s) were performed by non-physician practitioner and as supervising physician I was immediately available for consultation/collaboration.   Lyanne Co, MD 12/28/12 [phone]

## 2013-01-23 ENCOUNTER — Encounter: Payer: Self-pay | Admitting: Gastroenterology

## 2013-05-16 ENCOUNTER — Emergency Department (HOSPITAL_COMMUNITY): Payer: 59

## 2013-05-16 ENCOUNTER — Encounter (HOSPITAL_COMMUNITY): Payer: Self-pay

## 2013-05-16 ENCOUNTER — Emergency Department (HOSPITAL_COMMUNITY)
Admission: EM | Admit: 2013-05-16 | Discharge: 2013-05-16 | Disposition: A | Discharge status: Home / Self Care | Payer: 59 | Attending: Emergency Medicine | Admitting: Emergency Medicine

## 2013-05-16 DIAGNOSIS — Z8679 Personal history of other diseases of the circulatory system: Secondary | ICD-10-CM | POA: Insufficient documentation

## 2013-05-16 DIAGNOSIS — M542 Cervicalgia: Secondary | ICD-10-CM | POA: Insufficient documentation

## 2013-05-16 DIAGNOSIS — R209 Unspecified disturbances of skin sensation: Secondary | ICD-10-CM | POA: Insufficient documentation

## 2013-05-16 DIAGNOSIS — I509 Heart failure, unspecified: Secondary | ICD-10-CM | POA: Insufficient documentation

## 2013-05-16 DIAGNOSIS — I10 Essential (primary) hypertension: Secondary | ICD-10-CM | POA: Insufficient documentation

## 2013-05-16 DIAGNOSIS — Z880 Allergy status to penicillin: Secondary | ICD-10-CM | POA: Insufficient documentation

## 2013-05-16 DIAGNOSIS — J449 Chronic obstructive pulmonary disease, unspecified: Secondary | ICD-10-CM | POA: Insufficient documentation

## 2013-05-16 DIAGNOSIS — M25559 Pain in unspecified hip: Secondary | ICD-10-CM | POA: Insufficient documentation

## 2013-05-16 DIAGNOSIS — E119 Type 2 diabetes mellitus without complications: Secondary | ICD-10-CM | POA: Insufficient documentation

## 2013-05-16 DIAGNOSIS — J45909 Unspecified asthma, uncomplicated: Secondary | ICD-10-CM | POA: Insufficient documentation

## 2013-05-16 DIAGNOSIS — K219 Gastro-esophageal reflux disease without esophagitis: Secondary | ICD-10-CM | POA: Insufficient documentation

## 2013-05-16 DIAGNOSIS — IMO0002 Reserved for concepts with insufficient information to code with codable children: Secondary | ICD-10-CM

## 2013-05-16 DIAGNOSIS — Z8719 Personal history of other diseases of the digestive system: Secondary | ICD-10-CM | POA: Insufficient documentation

## 2013-05-16 DIAGNOSIS — M2550 Pain in unspecified joint: Secondary | ICD-10-CM | POA: Insufficient documentation

## 2013-05-16 DIAGNOSIS — M1990 Unspecified osteoarthritis, unspecified site: Secondary | ICD-10-CM | POA: Insufficient documentation

## 2013-05-16 DIAGNOSIS — IMO0001 Reserved for inherently not codable concepts without codable children: Secondary | ICD-10-CM | POA: Insufficient documentation

## 2013-05-16 DIAGNOSIS — R51 Headache: Secondary | ICD-10-CM | POA: Insufficient documentation

## 2013-05-16 DIAGNOSIS — M5412 Radiculopathy, cervical region: Secondary | ICD-10-CM

## 2013-05-16 DIAGNOSIS — J4489 Other specified chronic obstructive pulmonary disease: Secondary | ICD-10-CM | POA: Insufficient documentation

## 2013-05-16 DIAGNOSIS — M25552 Pain in left hip: Secondary | ICD-10-CM

## 2013-05-16 DIAGNOSIS — Z79899 Other long term (current) drug therapy: Secondary | ICD-10-CM | POA: Insufficient documentation

## 2013-05-16 DIAGNOSIS — Z8669 Personal history of other diseases of the nervous system and sense organs: Secondary | ICD-10-CM | POA: Insufficient documentation

## 2013-05-16 DIAGNOSIS — Z87891 Personal history of nicotine dependence: Secondary | ICD-10-CM | POA: Insufficient documentation

## 2013-05-16 LAB — BASIC METABOLIC PANEL
BUN: 10 mg/dL (ref 6–23)
CO2: 34 mEq/L — ABNORMAL HIGH (ref 19–32)
Chloride: 99 mEq/L (ref 96–112)
Creatinine, Ser: 0.8 mg/dL (ref 0.50–1.10)
Glucose, Bld: 104 mg/dL — ABNORMAL HIGH (ref 70–99)
Potassium: 3.4 mEq/L — ABNORMAL LOW (ref 3.5–5.1)

## 2013-05-16 LAB — CBC WITH DIFFERENTIAL/PLATELET
Basophils Relative: 1 % (ref 0–1)
HCT: 41.5 % (ref 36.0–46.0)
Hemoglobin: 14.1 g/dL (ref 12.0–15.0)
Lymphocytes Relative: 44 % (ref 12–46)
Lymphs Abs: 3.4 10*3/uL (ref 0.7–4.0)
MCHC: 34 g/dL (ref 30.0–36.0)
Monocytes Absolute: 0.6 10*3/uL (ref 0.1–1.0)
Monocytes Relative: 8 % (ref 3–12)
Neutro Abs: 3.4 10*3/uL (ref 1.7–7.7)
RBC: 4.62 MIL/uL (ref 3.87–5.11)

## 2013-05-16 MED ORDER — OXYCODONE-ACETAMINOPHEN 5-325 MG PO TABS: 1.0000 | ORAL_TABLET | ORAL | Status: DC | PRN

## 2013-05-16 MED ORDER — HYDROMORPHONE HCL PF 1 MG/ML IJ SOLN
1.0000 mg | Freq: Once | INTRAMUSCULAR | Status: AC
  Administered 2013-05-16: 1 mg via INTRAVENOUS
  Filled 2013-05-16: qty 1

## 2013-05-16 MED ORDER — ONDANSETRON HCL 4 MG/2ML IJ SOLN
4.0000 mg | Freq: Once | INTRAMUSCULAR | Status: AC
  Administered 2013-05-16: 4 mg via INTRAVENOUS
  Filled 2013-05-16: qty 2

## 2013-05-16 MED ORDER — PREDNISONE 10 MG PO TABS: ORAL_TABLET | ORAL | Status: DC

## 2013-05-16 MED ORDER — DIAZEPAM 10 MG PO TABS: 10.0000 mg | ORAL_TABLET | Freq: Four times a day (QID) | ORAL | Status: DC | PRN

## 2013-05-16 NOTE — ED Notes (Signed)
Pt complain of left arm pain that started yesterday. States she went to her PCP this morning and had lab work drawn. States she did not get to see her PCP but she told the nurse about her arm pain. States after she got home around 1000 she took a nap and when she woke up her left side felt numb and tingling

## 2013-05-16 NOTE — ED Provider Notes (Addendum)
History  This chart was scribed for Flint Melter, MD by Bennett Scrape, ED Scribe. This patient was seen in room APA09/APA09 and the patient's care was started at 6:24 PM.  [name]: [id number]  Arrival date & time 05/16/13  1723   First MD Initiated Contact with Patient 05/16/13 1824     Chief Complaint  Patient presents with  . Arm Pain    Patient is a 52 y.o. female presenting with arm injury. The history is provided by the patient. No language interpreter was used.  Arm Injury Location:  Arm Injury: no   Pain details:    Radiates to:  L forearm   Severity:  Moderate   Onset quality:  Gradual   Duration:  1 day   Timing:  Constant   Progression:  Worsening Chronicity:  New Dislocation: no   Foreign body present:  No foreign bodies Prior injury to area:  No Relieved by:  Nothing Worsened by:  Movement Ineffective treatments:  Aspirin Associated symptoms: neck pain   Associated symptoms: no back pain     HPI Comments: [name] is a 52 y.o. female who presents to the Emergency Department complaining of left arm pain that starts at the wrist and radiates up into the forearm with associated left sided neck pain and numbness in the left fingers that started yesterday. The neck pain is aggravated by movement of the neck and the arm is aggravated with movement as well. She denies alleviating factors. She denies any recent falls or injuries. She reports taking ASA with no improvement. She admits that she was at her PCP's office today for blood work but did not see her PCP during the visit. She also c/o left-sided HA for the past 3 days with associated blurred vision and left hip pain with an unknown onset. She reports that she has been eating and drinking normally since the onset. She denies CP and SOB as associated symptoms.   Past Medical History  Diagnosis Date  . GERD (gastroesophageal reflux disease)   . Gastritis   . Fibromyalgia   . HTN (hypertension)   . Edema of  lower extremity   . Kidney stone   . Fibromyalgia   . Migraine   . DDD (degenerative disc disease)   . MS (multiple sclerosis)   . Diabetes mellitus   . Neuropathy   . Asthma   . CHF (congestive heart failure)   . COPD (chronic obstructive pulmonary disease)   . Complication of anesthesia    Past Surgical History  Procedure Laterality Date  . S/p hysterectomy    . Hiatal herina    . Fundic gland polyp      benign  . Sigmoidoscopy  01/31/08    large internal hemorrhoids/small rectal polyp removed/rare sigmoid diverticula  . Esophagogastroduodenoscopy  10/29/07    normal  . Cholecystectomy    . Bladder surgery    . Abdominal hysterectomy    . Ddd     No family history on file. History  Substance Use Topics  . Smoking status: Former Smoker    Types: Cigarettes  . Smokeless tobacco: Former Neurosurgeon    Quit date: 07/07/2012  . Alcohol Use: No   No OB history provided.  Review of Systems  HENT: Positive for neck pain.   Respiratory: Negative for shortness of breath.   Cardiovascular: Negative for chest pain.  Musculoskeletal: Positive for arthralgias. Negative for back pain.  Neurological: Positive for numbness and headaches. Negative for weakness.  All other systems reviewed and are negative.    Allergies  Demerol; Doxycycline; Penicillins; and Sulfa antibiotics  Home Medications   Current Outpatient Rx  Name  Route  Sig  Dispense  Refill  . albuterol (PROVENTIL HFA;VENTOLIN HFA) 108 (90 BASE) MCG/ACT inhaler   Inhalation   Inhale 2 puffs into the lungs every 6 (six) hours as needed. Shortness of breath.         . ALPRAZolam (XANAX) 0.5 MG tablet   Oral   Take 0.5-1 mg by mouth Twice daily. 1 tablet in the morning and 2 tablets at bedtime.         . citalopram (CELEXA) 40 MG tablet   Oral   Take 40 mg by mouth daily.         . cyclobenzaprine (FLEXERIL) 10 MG tablet   Oral   Take 10 mg by mouth Every 6 hours as needed for muscle spasms.          .  Fluticasone-Salmeterol (ADVAIR) 250-50 MCG/DOSE AEPB   Inhalation   Inhale 1 puff into the lungs 2 (two) times daily as needed (shortness of breath).          . furosemide (LASIX) 20 MG tablet   Oral   Take 20 mg by mouth daily.         Marland Kitchen levothyroxine (SYNTHROID, LEVOTHROID) 125 MCG tablet   Oral   Take 125-187.5 mcg by mouth See admin instructions. Take 1 tablet (125 mcg total) on Sun Tue Wed Fri Sat. 1.5 tabs (187.59mcg) on Monday and Thursday         . lisinopril (PRINIVIL,ZESTRIL) 40 MG tablet   Oral   Take 40 mg by mouth daily.         . metFORMIN (GLUCOPHAGE) 500 MG tablet   Oral   Take 500 mg by mouth 2 (two) times daily with a meal.         . oxyCODONE-acetaminophen (PERCOCET) 10-325 MG per tablet   Oral   Take 1 tablet by mouth 4 times daily.         . diazepam (VALIUM) 10 MG tablet   Oral   Take 1 tablet (10 mg total) by mouth every 6 (six) hours as needed for anxiety.   20 tablet   0   . oxyCODONE-acetaminophen (PERCOCET) 5-325 MG per tablet   Oral   Take 1 tablet by mouth every 4 (four) hours as needed for pain.   20 tablet   0   . oxyCODONE-acetaminophen (PERCOCET/ROXICET) 5-325 MG per tablet   Oral   Take 1 tablet by mouth every 4 (four) hours as needed for pain.   6 tablet   0   . predniSONE (DELTASONE) 10 MG tablet      Take q day 6,5,4,3,2,1   21 tablet   0    Triage Vitals: BP 135/74  Pulse 73  Temp(Src) 98.2 F (36.8 C) (Oral)  Resp 24  Ht 5\' 6"  (1.676 m)  Wt 139 lb (63.05 kg)  BMI 22.45 kg/m2  SpO2 100%  Physical Exam  Nursing note and vitals reviewed. Constitutional: She is oriented to person, place, and time. She appears well-developed.  Appears older than stated age  HENT:  Head: Normocephalic and atraumatic.  Eyes: Conjunctivae and EOM are normal. Pupils are equal, round, and reactive to light.  Neck: Normal range of motion and phonation normal. Neck supple.  Tenderness to diffuse  c-spine to plaption, increased  pain with flexion  Cardiovascular: Normal rate, regular rhythm and intact distal pulses.   Pulmonary/Chest: Effort normal and breath sounds normal. She exhibits no tenderness.  Abdominal: Soft. She exhibits no distension. There is no tenderness. There is no guarding.  Musculoskeletal: Normal range of motion.  Tenderness to palpation of left trapezius, left shoulder and left forearm, no erythema or deformity of forearm, she resists motion of the left shoulder secondary to pain, no thoracic spine tenderness, left hip, knee and ankle tenderness with passive ROM, no deformities   Neurological: She is alert and oriented to person, place, and time. She has normal strength. No cranial nerve deficit. She exhibits normal muscle tone. Coordination normal.  Skin: Skin is warm and dry.  Psychiatric: Her behavior is normal. Judgment and thought content normal.  Appears depressed    ED Course  Procedures (including critical care time)  Medications  HYDROmorphone (DILAUDID) injection 1 mg (1 mg Intravenous Given 05/16/13 1916)  ondansetron (ZOFRAN) injection 4 mg (4 mg Intravenous Given 05/16/13 1916)    Patient Vitals for the past 24 hrs:  BP Temp Temp src Pulse Resp SpO2 Height Weight  05/16/13 2024 110/59 mmHg - - 72 16 95 % - -  05/16/13 1744 135/74 mmHg 98.2 F (36.8 C) Oral 73 24 100 % - -  05/16/13 1741 - - - - - - 5\' 6"  (1.676 m) 139 lb (63.05 kg)   9:32 PM Reevaluation with update and discussion. After initial assessment and treatment, an updated evaluation reveals she is somewhat more comfortable, now. There are no further complaints Sharolyn Weber L     Date: 05/16/13  Rate: 75  Rhythm: normal sinus rhythm  QRS Axis: normal  PR and QT Intervals: normal  ST/T Wave abnormalities: normal  PR and QRS Conduction Disutrbances:none  Narrative Interpretation:   Old EKG Reviewed: changes noted- since 12/29/12; ST abnormality resolved    DIAGNOSTIC STUDIES: Oxygen Saturation is 100% on room  air, normal by my interpretation.    COORDINATION OF CARE: 6:28 PM-Discussed treatment plan which includes CXR, CT of head, CBC panel and BMP with pt at bedside and pt agreed to plan.   Labs Reviewed  BASIC METABOLIC PANEL - Abnormal; Notable for the following:    Potassium 3.4 (*)    CO2 34 (*)    Glucose, Bld 104 (*)    GFR calc non Af Amer 82 (*)    All other components within normal limits  CBC WITH DIFFERENTIAL   Ct Head Wo Contrast  05/16/2013   *RADIOLOGY REPORT*  Clinical Data: Left arm pain with tingling, numbness and weakness.  CT HEAD WITHOUT CONTRAST  Technique:  Contiguous axial images were obtained from the base of the skull through the vertex without contrast.  Comparison: MRI brain 01/14/2009.  Head CT 01/14/2009.  Findings: There is no evidence of acute intracranial hemorrhage, mass lesion, brain edema or extra-axial fluid collection.  The ventricles and subarachnoid spaces are appropriately sized for age. There is no CT evidence of acute cortical infarction.  The visualized paranasal sinuses are clear. The calvarium is intact.  IMPRESSION: Stable unremarkable noncontrast head CT.   Original Report Authenticated By: Carey Bullocks, M.D.   1. Cervical radicular pain   2. Degenerative disc disease   3. 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 Doubt significant spinal stenosis, discitis, epidural bleeding, or tumor, or fracture. Her left hip pain, is a nonspecific. She is stable for discharge with symptomatic treatment.  Nursing  Notes Reviewed/ Care Coordinated, and agree without changes. Applicable Imaging Reviewed.  Interpretation of Laboratory Data incorporated into ED treatment   Plan: Home Medications- Percocet with prepack to go, prednisone, Valium; Home Treatments and Observation- rest, and heat treatments watch for progressive symptoms; return here if the recommended treatment, does not improve the symptoms; Recommended follow  up- call, PCP, to arrange for a cervical MRI, for definitive diagnosis, then referral to a specialist, as needed     I personally performed the services described in this documentation, which was scribed in my presence. The recorded information has been reviewed and is accurate.    Flint Melter, MD 05/16/13 2135  Flint Melter, MD 05/16/13 2157

## 2013-05-21 ENCOUNTER — Other Ambulatory Visit (HOSPITAL_COMMUNITY): Payer: Self-pay | Admitting: Internal Medicine

## 2013-05-21 DIAGNOSIS — IMO0002 Reserved for concepts with insufficient information to code with codable children: Secondary | ICD-10-CM

## 2013-05-23 ENCOUNTER — Encounter (HOSPITAL_COMMUNITY): Payer: Self-pay

## 2013-05-23 ENCOUNTER — Ambulatory Visit (HOSPITAL_COMMUNITY)
Admission: RE | Admit: 2013-05-23 | Discharge: 2013-05-23 | Disposition: A | Discharge status: Home / Self Care | Payer: 59 | Source: Ambulatory Visit | Attending: Internal Medicine | Admitting: Internal Medicine

## 2013-05-23 DIAGNOSIS — M5020 Other cervical disc displacement, unspecified cervical region: Secondary | ICD-10-CM | POA: Insufficient documentation

## 2013-05-23 DIAGNOSIS — R209 Unspecified disturbances of skin sensation: Secondary | ICD-10-CM | POA: Insufficient documentation

## 2013-05-23 DIAGNOSIS — M542 Cervicalgia: Secondary | ICD-10-CM | POA: Insufficient documentation

## 2013-05-23 DIAGNOSIS — IMO0002 Reserved for concepts with insufficient information to code with codable children: Secondary | ICD-10-CM

## 2013-05-23 DIAGNOSIS — M5380 Other specified dorsopathies, site unspecified: Secondary | ICD-10-CM | POA: Insufficient documentation

## 2013-05-27 MED FILL — Oxycodone w/ Acetaminophen Tab 5-325 MG: ORAL | Qty: 6 | Status: AC

## 2013-07-14 ENCOUNTER — Encounter (HOSPITAL_COMMUNITY): Payer: Self-pay | Admitting: Emergency Medicine

## 2013-07-14 ENCOUNTER — Emergency Department (HOSPITAL_COMMUNITY)
Admission: EM | Admit: 2013-07-14 | Discharge: 2013-07-14 | Disposition: A | Discharge status: Home / Self Care | Payer: 59 | Attending: Emergency Medicine | Admitting: Emergency Medicine

## 2013-07-14 ENCOUNTER — Emergency Department (HOSPITAL_COMMUNITY): Payer: 59

## 2013-07-14 DIAGNOSIS — IMO0002 Reserved for concepts with insufficient information to code with codable children: Secondary | ICD-10-CM | POA: Insufficient documentation

## 2013-07-14 DIAGNOSIS — R0600 Dyspnea, unspecified: Secondary | ICD-10-CM

## 2013-07-14 DIAGNOSIS — Z87442 Personal history of urinary calculi: Secondary | ICD-10-CM | POA: Insufficient documentation

## 2013-07-14 DIAGNOSIS — R059 Cough, unspecified: Secondary | ICD-10-CM | POA: Insufficient documentation

## 2013-07-14 DIAGNOSIS — Z8669 Personal history of other diseases of the nervous system and sense organs: Secondary | ICD-10-CM | POA: Insufficient documentation

## 2013-07-14 DIAGNOSIS — I509 Heart failure, unspecified: Secondary | ICD-10-CM | POA: Insufficient documentation

## 2013-07-14 DIAGNOSIS — I10 Essential (primary) hypertension: Secondary | ICD-10-CM | POA: Insufficient documentation

## 2013-07-14 DIAGNOSIS — Z8719 Personal history of other diseases of the digestive system: Secondary | ICD-10-CM | POA: Insufficient documentation

## 2013-07-14 DIAGNOSIS — E119 Type 2 diabetes mellitus without complications: Secondary | ICD-10-CM | POA: Insufficient documentation

## 2013-07-14 DIAGNOSIS — K219 Gastro-esophageal reflux disease without esophagitis: Secondary | ICD-10-CM | POA: Insufficient documentation

## 2013-07-14 DIAGNOSIS — Z87891 Personal history of nicotine dependence: Secondary | ICD-10-CM | POA: Insufficient documentation

## 2013-07-14 DIAGNOSIS — Z880 Allergy status to penicillin: Secondary | ICD-10-CM | POA: Insufficient documentation

## 2013-07-14 DIAGNOSIS — Z79899 Other long term (current) drug therapy: Secondary | ICD-10-CM | POA: Insufficient documentation

## 2013-07-14 DIAGNOSIS — R093 Abnormal sputum: Secondary | ICD-10-CM | POA: Insufficient documentation

## 2013-07-14 DIAGNOSIS — J441 Chronic obstructive pulmonary disease with (acute) exacerbation: Secondary | ICD-10-CM | POA: Insufficient documentation

## 2013-07-14 DIAGNOSIS — R0789 Other chest pain: Secondary | ICD-10-CM | POA: Insufficient documentation

## 2013-07-14 DIAGNOSIS — Z8739 Personal history of other diseases of the musculoskeletal system and connective tissue: Secondary | ICD-10-CM | POA: Insufficient documentation

## 2013-07-14 DIAGNOSIS — R05 Cough: Secondary | ICD-10-CM | POA: Insufficient documentation

## 2013-07-14 LAB — CBC WITH DIFFERENTIAL/PLATELET
Basophils Absolute: 0.1 10*3/uL (ref 0.0–0.1)
HCT: 39.8 % (ref 36.0–46.0)
Hemoglobin: 13.8 g/dL (ref 12.0–15.0)
Lymphocytes Relative: 36 % (ref 12–46)
Lymphs Abs: 2 10*3/uL (ref 0.7–4.0)
MCV: 88.4 fL (ref 78.0–100.0)
Monocytes Absolute: 0.4 10*3/uL (ref 0.1–1.0)
Monocytes Relative: 8 % (ref 3–12)
Neutro Abs: 3 10*3/uL (ref 1.7–7.7)
RBC: 4.5 MIL/uL (ref 3.87–5.11)
RDW: 13.2 % (ref 11.5–15.5)
WBC: 5.6 10*3/uL (ref 4.0–10.5)

## 2013-07-14 LAB — BASIC METABOLIC PANEL
CO2: 30 mEq/L (ref 19–32)
Chloride: 102 mEq/L (ref 96–112)
Creatinine, Ser: 0.76 mg/dL (ref 0.50–1.10)
Glucose, Bld: 101 mg/dL — ABNORMAL HIGH (ref 70–99)

## 2013-07-14 LAB — TROPONIN I: Troponin I: <0.3 ng/mL (ref <0.30)

## 2013-07-14 MED ORDER — KETOROLAC TROMETHAMINE 30 MG/ML IJ SOLN
30.0000 mg | Freq: Once | INTRAMUSCULAR | Status: AC
  Administered 2013-07-14: 30 mg via INTRAVENOUS
  Filled 2013-07-14: qty 1

## 2013-07-14 MED ORDER — IPRATROPIUM BROMIDE 0.02 % IN SOLN
0.5000 mg | Freq: Once | RESPIRATORY_TRACT | Status: AC
  Administered 2013-07-14: 0.5 mg via RESPIRATORY_TRACT
  Filled 2013-07-14: qty 2.5

## 2013-07-14 MED ORDER — TRAMADOL HCL 50 MG PO TABS
100.0000 mg | ORAL_TABLET | Freq: Once | ORAL | Status: DC
  Filled 2013-07-14: qty 2

## 2013-07-14 MED ORDER — GUAIFENESIN-CODEINE 100-10 MG/5ML PO SOLN: 5.0000 mL | Freq: Three times a day (TID) | ORAL | Status: DC | PRN

## 2013-07-14 MED ORDER — AZITHROMYCIN 250 MG PO TABS: ORAL_TABLET | ORAL | Status: DC

## 2013-07-14 MED ORDER — METHYLPREDNISOLONE SODIUM SUCC 125 MG IJ SOLR
125.0000 mg | Freq: Once | INTRAMUSCULAR | Status: AC
  Administered 2013-07-14: 125 mg via INTRAVENOUS
  Filled 2013-07-14: qty 2

## 2013-07-14 MED ORDER — PREDNISONE 10 MG PO TABS: 20.0000 mg | ORAL_TABLET | Freq: Two times a day (BID) | ORAL | Status: DC

## 2013-07-14 MED ORDER — ALBUTEROL SULFATE (5 MG/ML) 0.5% IN NEBU
5.0000 mg | INHALATION_SOLUTION | Freq: Once | RESPIRATORY_TRACT | Status: AC
  Administered 2013-07-14: 5 mg via RESPIRATORY_TRACT
  Filled 2013-07-14: qty 1

## 2013-07-14 NOTE — ED Notes (Signed)
Pt comes via EMS with c/o SOB and difficulty breathing. Pt has hx of COPD, emphysema. Pt also reports chest tightness. Pt was at church singing this morning when symptoms began. Pt appeared anxious on EMS arrival. Pt was on NRB mask in route but is on room air on arrival to ED. Pt is A&Ox4.

## 2013-07-14 NOTE — ED Provider Notes (Signed)
[name]: [id number]     Arrival date & time 07/14/13  1139 History   This chart was scribed for Nichole Lyons, MD, by Yevette Edwards, ED Scribe. This patient was seen in room APA01/APA01 and the patient's care was started at 1:11 PM. First MD Initiated Contact with Patient 07/14/13 1311     Chief Complaint  Patient presents with  . Respiratory Distress   (Consider location/radiation/quality/duration/timing/severity/associated sxs/prior Treatment) The history is provided by the patient and the spouse. No language interpreter was used.   HPI Comments: [name] is a 52 y.o. female, with a h/o COPD and who is a current smoker, who presents to the Emergency Department via EMS complaining of gradually-increasing SOB which occurred this morning while she was singing at church. The pt reports that she also experienced chest tightness as an associated symptom. Additionally, last week she had a cough productive of green sputum. She denies any h/o prior symptoms to the same severity. The pt also denies any pedal swelling. She does not use oxygen at home, though she does have an albuterol inhaler she intermittently uses.  The pt denies any cardiac stents.  Past Medical History  Diagnosis Date  . GERD (gastroesophageal reflux disease)   . Gastritis   . Fibromyalgia   . HTN (hypertension)   . Edema of lower extremity   . Kidney stone   . Fibromyalgia   . Migraine   . DDD (degenerative disc disease)   . MS (multiple sclerosis)   . Diabetes mellitus   . Neuropathy   . Asthma   . CHF (congestive heart failure)   . COPD (chronic obstructive pulmonary disease)   . Complication of anesthesia    Past Surgical History  Procedure Laterality Date  . S/p hysterectomy    . Hiatal herina    . Fundic gland polyp      benign  . Sigmoidoscopy  01/31/08    large internal hemorrhoids/small rectal polyp removed/rare sigmoid diverticula  . Esophagogastroduodenoscopy  10/29/07    normal  . Cholecystectomy     . Bladder surgery    . Abdominal hysterectomy    . Ddd     History reviewed. No pertinent family history. History  Substance Use Topics  . Smoking status: Former Smoker    Types: Cigarettes  . Smokeless tobacco: Former Neurosurgeon    Quit date: 07/07/2012  . Alcohol Use: No   No OB history provided.  Review of Systems  Constitutional: Negative for fever and chills.  Respiratory: Positive for cough, chest tightness and shortness of breath.   Cardiovascular: Negative for leg swelling.  All other systems reviewed and are negative.    Allergies  Demerol; Doxycycline; Penicillins; and Sulfa antibiotics  Home Medications   Current Outpatient Rx  Name  Route  Sig  Dispense  Refill  . albuterol (PROVENTIL HFA;VENTOLIN HFA) 108 (90 BASE) MCG/ACT inhaler   Inhalation   Inhale 2 puffs into the lungs every 6 (six) hours as needed. Shortness of breath.         . ALPRAZolam (XANAX) 0.5 MG tablet   Oral   Take 0.5-1 mg by mouth Twice daily. 1 tablet in the morning and 2 tablets at bedtime.         . citalopram (CELEXA) 40 MG tablet   Oral   Take 40 mg by mouth daily.         . cyclobenzaprine (FLEXERIL) 10 MG tablet   Oral   Take 10 mg by mouth  Every 6 hours as needed for muscle spasms.          . Fluticasone-Salmeterol (ADVAIR) 250-50 MCG/DOSE AEPB   Inhalation   Inhale 1 puff into the lungs 2 (two) times daily as needed (shortness of breath).          . furosemide (LASIX) 20 MG tablet   Oral   Take 20 mg by mouth daily.         Marland Kitchen levothyroxine (SYNTHROID, LEVOTHROID) 125 MCG tablet   Oral   Take 125-187.5 mcg by mouth See admin instructions. Take 1 tablet (125 mcg total) on Sun Tue Wed Fri Sat. 1.5 tabs (187.5mcg) on Monday and Thursday         . lisinopril (PRINIVIL,ZESTRIL) 40 MG tablet   Oral   Take 40 mg by mouth daily.         . metFORMIN (GLUCOPHAGE) 500 MG tablet   Oral   Take 500 mg by mouth 2 (two) times daily with a meal.         .  oxyCODONE-acetaminophen (PERCOCET) 10-325 MG per tablet   Oral   Take 1 tablet by mouth 4 times daily.          Triage Vitals: BP 123/65  Pulse 77  Temp(Src) 98.3 F (36.8 C) (Oral)  Resp 20  SpO2 98% Physical Exam  Nursing note and vitals reviewed. Constitutional: She is oriented to person, place, and time. She appears well-developed and well-nourished. No distress.  HENT:  Head: Normocephalic and atraumatic.  Eyes: EOM are normal. Pupils are equal, round, and reactive to light.  Neck: Normal range of motion. Neck supple. No tracheal deviation present.  Cardiovascular: Normal rate, regular rhythm, normal heart sounds and intact distal pulses.   No murmur heard. Pulmonary/Chest: Effort normal. No respiratory distress. She has no wheezes.  Musculoskeletal: Normal range of motion. She exhibits no edema.  Neurological: She is alert and oriented to person, place, and time.  Skin: Skin is warm and dry.  Psychiatric: She has a normal mood and affect. Her behavior is normal.    ED Course  Procedures (including critical care time)  DIAGNOSTIC STUDIES: Oxygen Saturation is 98% on room air, normal by my interpretation.    COORDINATION OF CARE:  1:15 PM- Discussed treatment plan with patient, and the patient agreed to the plan.   Labs Review Labs Reviewed  BASIC METABOLIC PANEL - Abnormal; Notable for the following:    Glucose, Bld 101 (*)    All other components within normal limits  CBC WITH DIFFERENTIAL  TROPONIN I   Imaging Review Dg Chest Portable 1 View  07/14/2013   *RADIOLOGY REPORT*  Clinical Data: Respiratory distress with chest pain.  PORTABLE CHEST - 1 VIEW  Comparison: 05/16/2013  Findings: Lungs are adequately inflated without consolidation or effusion.  The cardiomediastinal silhouette and remainder of the exam is unchanged.  IMPRESSION: No acute cardiopulmonary disease.   Original Report Authenticated By: Elberta Fortis, M.D.    Date: 07/14/2013  Rate: 88   Rhythm: normal sinus rhythm  QRS Axis: normal  Intervals: normal  ST/T Wave abnormalities: normal  Conduction Disutrbances:none  Narrative Interpretation:   Old EKG Reviewed: unchanged    MDM  No diagnosis found. Patient is a 52 year old female past medical history significant for COPD. She presents today with complaints of tightness in the chest, shortness of breath, and feeling lightheaded that started while singing in church. Her exam is unremarkable and she appears hemodynamically stable. I appreciate no  wheezing, this after she received a nebulizer treatment. Chest x-ray is unremarkable and laboratory studies are unremarkable as well. Her EKG and troponin are negative and I doubt there is a cardiac etiology to this. The patient had a heart cath several years ago which was normal. I do not feel as though further cardiac tests are necessary and we'll discharge the patient with treatment for a COPD exacerbation. I suspect there may be some component of bronchospasm to her symptoms as well.    I personally performed the services described in this documentation, which was scribed in my presence. The recorded information has been reviewed and is accurate.       Nichole Lyons, MD 07/14/13 [phone]

## 2013-07-14 NOTE — ED Notes (Signed)
Pt reports has had productive cough x 3 days.  Today was singing in the choir and started becoming more SOB and having cp.

## 2013-08-24 ENCOUNTER — Encounter (HOSPITAL_COMMUNITY): Payer: Self-pay | Admitting: Emergency Medicine

## 2013-08-24 ENCOUNTER — Observation Stay (HOSPITAL_COMMUNITY): Payer: 59

## 2013-08-24 ENCOUNTER — Emergency Department (HOSPITAL_COMMUNITY): Payer: 59

## 2013-08-24 ENCOUNTER — Inpatient Hospital Stay (HOSPITAL_COMMUNITY)
Admission: EM | Admit: 2013-08-24 | Discharge: 2013-08-27 | DRG: 195 | Disposition: A | Discharge status: Home / Self Care | Payer: 59 | Attending: Family Medicine | Admitting: Family Medicine

## 2013-08-24 DIAGNOSIS — I509 Heart failure, unspecified: Secondary | ICD-10-CM | POA: Diagnosis present

## 2013-08-24 DIAGNOSIS — J441 Chronic obstructive pulmonary disease with (acute) exacerbation: Secondary | ICD-10-CM

## 2013-08-24 DIAGNOSIS — M109 Gout, unspecified: Secondary | ICD-10-CM | POA: Diagnosis present

## 2013-08-24 DIAGNOSIS — J189 Pneumonia, unspecified organism: Secondary | ICD-10-CM | POA: Diagnosis present

## 2013-08-24 DIAGNOSIS — F17210 Nicotine dependence, cigarettes, uncomplicated: Secondary | ICD-10-CM | POA: Diagnosis present

## 2013-08-24 DIAGNOSIS — R0902 Hypoxemia: Secondary | ICD-10-CM | POA: Diagnosis present

## 2013-08-24 DIAGNOSIS — F411 Generalized anxiety disorder: Secondary | ICD-10-CM | POA: Diagnosis present

## 2013-08-24 DIAGNOSIS — IMO0001 Reserved for inherently not codable concepts without codable children: Secondary | ICD-10-CM | POA: Diagnosis present

## 2013-08-24 DIAGNOSIS — J4489 Other specified chronic obstructive pulmonary disease: Secondary | ICD-10-CM | POA: Diagnosis present

## 2013-08-24 DIAGNOSIS — F419 Anxiety disorder, unspecified: Secondary | ICD-10-CM

## 2013-08-24 DIAGNOSIS — K219 Gastro-esophageal reflux disease without esophagitis: Secondary | ICD-10-CM | POA: Diagnosis present

## 2013-08-24 DIAGNOSIS — D72829 Elevated white blood cell count, unspecified: Secondary | ICD-10-CM

## 2013-08-24 DIAGNOSIS — E119 Type 2 diabetes mellitus without complications: Secondary | ICD-10-CM | POA: Diagnosis present

## 2013-08-24 DIAGNOSIS — J385 Laryngeal spasm: Secondary | ICD-10-CM

## 2013-08-24 DIAGNOSIS — Z833 Family history of diabetes mellitus: Secondary | ICD-10-CM

## 2013-08-24 DIAGNOSIS — G35 Multiple sclerosis: Secondary | ICD-10-CM | POA: Diagnosis present

## 2013-08-24 DIAGNOSIS — J449 Chronic obstructive pulmonary disease, unspecified: Secondary | ICD-10-CM | POA: Diagnosis present

## 2013-08-24 DIAGNOSIS — F17200 Nicotine dependence, unspecified, uncomplicated: Secondary | ICD-10-CM | POA: Diagnosis present

## 2013-08-24 DIAGNOSIS — G894 Chronic pain syndrome: Secondary | ICD-10-CM | POA: Diagnosis present

## 2013-08-24 DIAGNOSIS — E039 Hypothyroidism, unspecified: Secondary | ICD-10-CM | POA: Diagnosis present

## 2013-08-24 DIAGNOSIS — M62838 Other muscle spasm: Secondary | ICD-10-CM

## 2013-08-24 DIAGNOSIS — R079 Chest pain, unspecified: Secondary | ICD-10-CM | POA: Diagnosis present

## 2013-08-24 DIAGNOSIS — Z8673 Personal history of transient ischemic attack (TIA), and cerebral infarction without residual deficits: Secondary | ICD-10-CM

## 2013-08-24 DIAGNOSIS — I10 Essential (primary) hypertension: Secondary | ICD-10-CM

## 2013-08-24 HISTORY — DX: Hypothyroidism, unspecified: E03.9

## 2013-08-24 HISTORY — DX: Cerebral infarction, unspecified: I63.9

## 2013-08-24 HISTORY — DX: Diaphragmatic hernia without obstruction or gangrene: K44.9

## 2013-08-24 HISTORY — DX: Gout, unspecified: M10.9

## 2013-08-24 LAB — COMPREHENSIVE METABOLIC PANEL
BUN: 10 mg/dL (ref 6–23)
CO2: 26 mEq/L (ref 19–32)
Chloride: 101 mEq/L (ref 96–112)
Creatinine, Ser: 0.64 mg/dL (ref 0.50–1.10)
GFR calc Af Amer: >90 mL/min (ref >90)
GFR calc non Af Amer: >90 mL/min (ref >90)
Total Bilirubin: 0.3 mg/dL (ref 0.3–1.2)

## 2013-08-24 LAB — CBC WITH DIFFERENTIAL/PLATELET
HCT: 41.8 % (ref 36.0–46.0)
Hemoglobin: 14.5 g/dL (ref 12.0–15.0)
Lymphocytes Relative: 30 % (ref 12–46)
MCHC: 34.7 g/dL (ref 30.0–36.0)
MCV: 88.2 fL (ref 78.0–100.0)
Monocytes Absolute: 0.6 10*3/uL (ref 0.1–1.0)
Monocytes Relative: 8 % (ref 3–12)
Neutro Abs: 4.1 10*3/uL (ref 1.7–7.7)
WBC: 7 10*3/uL (ref 4.0–10.5)

## 2013-08-24 LAB — TROPONIN I
Troponin I: <0.3 ng/mL (ref <0.30)
Troponin I: <0.3 ng/mL (ref <0.30)

## 2013-08-24 MED ORDER — CITALOPRAM HYDROBROMIDE 20 MG PO TABS
40.0000 mg | ORAL_TABLET | Freq: Every day | ORAL | Status: DC
  Administered 2013-08-24 – 2013-08-27 (×4): 40 mg via ORAL
  Filled 2013-08-24 (×4): qty 2

## 2013-08-24 MED ORDER — IPRATROPIUM BROMIDE 0.02 % IN SOLN
0.5000 mg | Freq: Once | RESPIRATORY_TRACT | Status: AC
  Administered 2013-08-24: 0.5 mg via RESPIRATORY_TRACT
  Filled 2013-08-24: qty 2.5

## 2013-08-24 MED ORDER — LEVOFLOXACIN IN D5W 750 MG/150ML IV SOLN
750.0000 mg | INTRAVENOUS | Status: DC
  Administered 2013-08-25 – 2013-08-26 (×2): 750 mg via INTRAVENOUS
  Filled 2013-08-24 (×3): qty 150

## 2013-08-24 MED ORDER — CYCLOBENZAPRINE HCL 10 MG PO TABS
10.0000 mg | ORAL_TABLET | Freq: Three times a day (TID) | ORAL | Status: DC | PRN
  Administered 2013-08-24 – 2013-08-27 (×8): 10 mg via ORAL
  Filled 2013-08-24 (×8): qty 1

## 2013-08-24 MED ORDER — SODIUM CHLORIDE 0.9 % IV SOLN: 250.0000 mL | INTRAVENOUS | Status: DC | PRN

## 2013-08-24 MED ORDER — OXYCODONE-ACETAMINOPHEN 5-325 MG PO TABS
1.0000 | ORAL_TABLET | Freq: Four times a day (QID) | ORAL | Status: DC | PRN
  Administered 2013-08-24 – 2013-08-27 (×10): 1 via ORAL
  Filled 2013-08-24 (×10): qty 1

## 2013-08-24 MED ORDER — LORAZEPAM 2 MG/ML IJ SOLN
1.0000 mg | Freq: Once | INTRAMUSCULAR | Status: AC
  Administered 2013-08-24: 1 mg via INTRAVENOUS
  Filled 2013-08-24: qty 1

## 2013-08-24 MED ORDER — ACETAMINOPHEN 325 MG PO TABS: 650.0000 mg | ORAL_TABLET | Freq: Four times a day (QID) | ORAL | Status: DC | PRN

## 2013-08-24 MED ORDER — ACETAMINOPHEN 650 MG RE SUPP: 650.0000 mg | Freq: Four times a day (QID) | RECTAL | Status: DC | PRN

## 2013-08-24 MED ORDER — MORPHINE SULFATE 2 MG/ML IJ SOLN
2.0000 mg | INTRAMUSCULAR | Status: DC | PRN
  Administered 2013-08-24 – 2013-08-26 (×9): 2 mg via INTRAVENOUS
  Filled 2013-08-24 (×9): qty 1

## 2013-08-24 MED ORDER — ALBUTEROL SULFATE (5 MG/ML) 0.5% IN NEBU: 2.5000 mg | INHALATION_SOLUTION | RESPIRATORY_TRACT | Status: DC | PRN

## 2013-08-24 MED ORDER — ASPIRIN EC 325 MG PO TBEC
325.0000 mg | DELAYED_RELEASE_TABLET | Freq: Every day | ORAL | Status: DC
  Administered 2013-08-24 – 2013-08-27 (×4): 325 mg via ORAL
  Filled 2013-08-24 (×4): qty 1

## 2013-08-24 MED ORDER — OXYCODONE HCL 5 MG PO TABS
5.0000 mg | ORAL_TABLET | Freq: Four times a day (QID) | ORAL | Status: DC | PRN
  Administered 2013-08-24 – 2013-08-27 (×9): 5 mg via ORAL
  Filled 2013-08-24 (×9): qty 1

## 2013-08-24 MED ORDER — LEVOTHYROXINE SODIUM 25 MCG PO TABS
125.0000 ug | ORAL_TABLET | ORAL | Status: DC
  Administered 2013-08-25 – 2013-08-27 (×2): 125 ug via ORAL
  Filled 2013-08-24 (×2): qty 1

## 2013-08-24 MED ORDER — METHYLPREDNISOLONE SODIUM SUCC 125 MG IJ SOLR
125.0000 mg | Freq: Once | INTRAMUSCULAR | Status: AC
  Administered 2013-08-24: 125 mg via INTRAVENOUS
  Filled 2013-08-24: qty 2

## 2013-08-24 MED ORDER — LEVOFLOXACIN IN D5W 500 MG/100ML IV SOLN
500.0000 mg | Freq: Once | INTRAVENOUS | Status: AC
  Administered 2013-08-24: 500 mg via INTRAVENOUS
  Filled 2013-08-24: qty 100

## 2013-08-24 MED ORDER — LISINOPRIL 10 MG PO TABS
40.0000 mg | ORAL_TABLET | Freq: Every day | ORAL | Status: DC
  Administered 2013-08-24 – 2013-08-27 (×4): 40 mg via ORAL
  Filled 2013-08-24 (×4): qty 4

## 2013-08-24 MED ORDER — ONDANSETRON HCL 4 MG PO TABS: 4.0000 mg | ORAL_TABLET | Freq: Four times a day (QID) | ORAL | Status: DC | PRN

## 2013-08-24 MED ORDER — IOHEXOL 350 MG/ML SOLN
100.0000 mL | Freq: Once | INTRAVENOUS | Status: AC | PRN
  Administered 2013-08-24: 100 mL via INTRAVENOUS

## 2013-08-24 MED ORDER — LEVOTHYROXINE SODIUM 75 MCG PO TABS
187.5000 ug | ORAL_TABLET | ORAL | Status: DC
  Administered 2013-08-26: 187.5 ug via ORAL
  Filled 2013-08-24 (×3): qty 3

## 2013-08-24 MED ORDER — ALPRAZOLAM 1 MG PO TABS
1.0000 mg | ORAL_TABLET | Freq: Every day | ORAL | Status: DC
  Administered 2013-08-24 – 2013-08-26 (×3): 1 mg via ORAL
  Filled 2013-08-24 (×3): qty 1

## 2013-08-24 MED ORDER — FAMOTIDINE 40 MG/5ML PO SUSR
20.0000 mg | Freq: Two times a day (BID) | ORAL | Status: DC
  Filled 2013-08-24 (×4): qty 2.5

## 2013-08-24 MED ORDER — SODIUM CHLORIDE 0.9 % IJ SOLN
3.0000 mL | Freq: Two times a day (BID) | INTRAMUSCULAR | Status: DC
  Administered 2013-08-24: 3 mL via INTRAVENOUS

## 2013-08-24 MED ORDER — ONDANSETRON HCL 4 MG/2ML IJ SOLN
4.0000 mg | Freq: Once | INTRAMUSCULAR | Status: AC
  Administered 2013-08-24: 4 mg via INTRAVENOUS
  Filled 2013-08-24: qty 2

## 2013-08-24 MED ORDER — OXYCODONE-ACETAMINOPHEN 10-325 MG PO TABS: 1.0000 | ORAL_TABLET | Freq: Four times a day (QID) | ORAL | Status: DC | PRN

## 2013-08-24 MED ORDER — PANTOPRAZOLE SODIUM 40 MG IV SOLR
40.0000 mg | Freq: Once | INTRAVENOUS | Status: AC
  Administered 2013-08-24: 40 mg via INTRAVENOUS
  Filled 2013-08-24: qty 40

## 2013-08-24 MED ORDER — RACEPINEPHRINE HCL 2.25 % IN NEBU
0.5000 mL | INHALATION_SOLUTION | Freq: Once | RESPIRATORY_TRACT | Status: AC
  Administered 2013-08-24: 0.5 mL via RESPIRATORY_TRACT
  Filled 2013-08-24: qty 0.5

## 2013-08-24 MED ORDER — IPRATROPIUM BROMIDE 0.02 % IN SOLN
0.5000 mg | Freq: Four times a day (QID) | RESPIRATORY_TRACT | Status: DC
  Administered 2013-08-24 – 2013-08-26 (×7): 0.5 mg via RESPIRATORY_TRACT
  Filled 2013-08-24 (×8): qty 2.5

## 2013-08-24 MED ORDER — ALBUTEROL SULFATE (5 MG/ML) 0.5% IN NEBU
5.0000 mg | INHALATION_SOLUTION | Freq: Once | RESPIRATORY_TRACT | Status: AC
  Administered 2013-08-24: 5 mg via RESPIRATORY_TRACT
  Filled 2013-08-24: qty 1

## 2013-08-24 MED ORDER — LEVOTHYROXINE SODIUM 125 MCG PO TABS: 125.0000 ug | ORAL_TABLET | ORAL | Status: DC

## 2013-08-24 MED ORDER — SODIUM CHLORIDE 0.9 % IJ SOLN
3.0000 mL | Freq: Two times a day (BID) | INTRAMUSCULAR | Status: DC
  Administered 2013-08-25 – 2013-08-26 (×4): 3 mL via INTRAVENOUS

## 2013-08-24 MED ORDER — FUROSEMIDE 20 MG PO TABS
20.0000 mg | ORAL_TABLET | Freq: Every day | ORAL | Status: DC
  Administered 2013-08-24 – 2013-08-27 (×4): 20 mg via ORAL
  Filled 2013-08-24 (×4): qty 1

## 2013-08-24 MED ORDER — ALPRAZOLAM 0.5 MG PO TABS
0.5000 mg | ORAL_TABLET | Freq: Every morning | ORAL | Status: DC
  Administered 2013-08-25 – 2013-08-27 (×3): 0.5 mg via ORAL
  Filled 2013-08-24 (×3): qty 1

## 2013-08-24 MED ORDER — ALBUTEROL SULFATE (5 MG/ML) 0.5% IN NEBU
2.5000 mg | INHALATION_SOLUTION | Freq: Four times a day (QID) | RESPIRATORY_TRACT | Status: DC
  Administered 2013-08-24 – 2013-08-26 (×7): 2.5 mg via RESPIRATORY_TRACT
  Filled 2013-08-24 (×8): qty 0.5

## 2013-08-24 MED ORDER — DIAZEPAM 5 MG/ML IJ SOLN
5.0000 mg | Freq: Once | INTRAMUSCULAR | Status: AC
  Administered 2013-08-24: 5 mg via INTRAVENOUS
  Filled 2013-08-24: qty 2

## 2013-08-24 MED ORDER — SODIUM CHLORIDE 0.9 % IJ SOLN: 3.0000 mL | INTRAMUSCULAR | Status: DC | PRN

## 2013-08-24 MED ORDER — OXYCODONE-ACETAMINOPHEN 5-325 MG PO TABS
1.0000 | ORAL_TABLET | Freq: Once | ORAL | Status: AC
  Administered 2013-08-24: 1 via ORAL
  Filled 2013-08-24: qty 1

## 2013-08-24 MED ORDER — ONDANSETRON HCL 4 MG/2ML IJ SOLN: 4.0000 mg | Freq: Four times a day (QID) | INTRAMUSCULAR | Status: DC | PRN

## 2013-08-24 MED ORDER — KETOROLAC TROMETHAMINE 30 MG/ML IJ SOLN
30.0000 mg | Freq: Once | INTRAMUSCULAR | Status: AC
  Administered 2013-08-24: 30 mg via INTRAVENOUS
  Filled 2013-08-24: qty 1

## 2013-08-24 MED ORDER — ALUM & MAG HYDROXIDE-SIMETH 200-200-20 MG/5ML PO SUSP
30.0000 mL | Freq: Four times a day (QID) | ORAL | Status: DC | PRN
  Administered 2013-08-24: 30 mL via ORAL
  Filled 2013-08-24: qty 30

## 2013-08-24 NOTE — ED Provider Notes (Addendum)
[name]: [id number]     Arrival date & time 08/24/13  1120 History  This chart was scribed for Nichole Givens, MD by [name], ED Scribe. The patient was seen in room APA02/APA02. Patient's care was started at 11:55 AM.    Chief Complaint  Patient presents with  . Shortness of Breath  . Chest Pain   The history is provided by the patient. No language interpreter was used.   LEVEL 5 CAVEAT FOR RESPIRATORY DIFFICULTY  HPI Comments: [name] is a 52 y.o. female with history of HTN, MS, DM, COPD, CHF, GERD who presents to the Emergency Department complaining of constant, moderate shortness of breath onset less than 1 hour ago. Husband states that they were riding in the car when patient suddenly began complaining of shortness of breath, right lateral neck pain, and central chest soreness. She also states her throat feels tight.  She tells me today that the past few days she has been having burning pain in her chest, upper abdomen and throat. Her other symptoms today include nausea and vomiting. Her husband reports that she has also been exhibiting body shaking, but she has never done this before and she has no prior history of tremors. He states that nothing upsetting occurred before her symptoms began. Patient has not eaten today.  She was unable to use her inhaler because she couldn't breathe in, she rarely uses an inhaler. She used to take  Pepcid twice a day for reflux. Husband says that she had an episode of dyspnea at church a few weeks ago and her face turned red and she said her throat was tight. She was treated here in the ED respiratory distress and COPD exacerbation.  Patient does not work and is not on disability.  PCP - [name]   Past Medical History  Diagnosis Date  . GERD (gastroesophageal reflux disease)   . Gastritis   . Fibromyalgia   . HTN (hypertension)   . Edema of lower extremity   . Kidney stone   . Fibromyalgia   . Migraine   . DDD (degenerative disc disease)   .  MS (multiple sclerosis)   . Diabetes mellitus   . Neuropathy   . Asthma   . CHF (congestive heart failure)   . COPD (chronic obstructive pulmonary disease)   . Complication of anesthesia   . Hiatal hernia   . Stroke   . Migraine   . Hypothyroidism   . Gout    Past Surgical History  Procedure Laterality Date  . Fundic gland polyp      benign  . Sigmoidoscopy  01/31/08    large internal hemorrhoids/small rectal polyp removed/rare sigmoid diverticula  . Esophagogastroduodenoscopy  10/29/07    normal  . Cholecystectomy    . Bladder suspension    . Abdominal hysterectomy    . Carpal tunnel release      right   Family History  Problem Relation Age of Onset  . Diabetes Mother    History  Substance Use Topics  . Smoking status: Current Every Day Smoker -- 0.50 packs/day    Types: Cigarettes  . Smokeless tobacco: Former Neurosurgeon    Quit date: 07/07/2012  . Alcohol Use: No  Smokes 1 pack of cigarettes every 2 days. unemployed  OB History   Grav Para Term Preterm Abortions TAB SAB Ect Mult Living                 Review of Systems  Respiratory: Positive for shortness of breath.   Cardiovascular: Positive for chest pain.  Gastrointestinal: Positive for nausea and vomiting.  Musculoskeletal: Positive for neck pain.  All other systems reviewed and are negative.    Allergies  Sulfa antibiotics; Demerol; Doxycycline; and Penicillins  Home Medications   Current Outpatient Rx  Name  Route  Sig  Dispense  Refill  . albuterol (PROVENTIL HFA;VENTOLIN HFA) 108 (90 BASE) MCG/ACT inhaler   Inhalation   Inhale 2 puffs into the lungs every 6 (six) hours as needed. Shortness of breath.         . ALPRAZolam (XANAX) 0.5 MG tablet   Oral   Take 0.5-1 mg by mouth Twice daily. 1 tablet in the morning and 2 tablets at bedtime.         . citalopram (CELEXA) 40 MG tablet   Oral   Take 40 mg by mouth daily.         . cyclobenzaprine (FLEXERIL) 10 MG tablet   Oral   Take 10 mg  by mouth Every 6 hours as needed for muscle spasms.          . Fluticasone-Salmeterol (ADVAIR) 250-50 MCG/DOSE AEPB   Inhalation   Inhale 1 puff into the lungs 2 (two) times daily as needed (shortness of breath).          . furosemide (LASIX) 20 MG tablet   Oral   Take 20 mg by mouth daily.         Marland Kitchen levothyroxine (SYNTHROID, LEVOTHROID) 125 MCG tablet   Oral   Take 125-187.5 mcg by mouth See admin instructions. Take 1 tablet (125 mcg total) on Sun Tue Wed Fri Sat. 1.5 tabs (187.21mcg) on Monday and Thursday         . lisinopril (PRINIVIL,ZESTRIL) 40 MG tablet   Oral   Take 40 mg by mouth daily.         Marland Kitchen oxyCODONE-acetaminophen (PERCOCET) 10-325 MG per tablet   Oral   Take 1 tablet by mouth 4 times daily.          Triage Vitals: BP 183/82  Pulse 101  Temp(Src) 98.6 F (37 C) (Oral)  Resp 24  SpO2 96%  Vital signs normal hypertension, tachycardia, tachypneia  Physical Exam  Nursing note and vitals reviewed. Constitutional: She is oriented to person, place, and time. She appears well-developed and well-nourished.  Non-toxic appearance. She does not appear ill. She appears distressed.  HENT:  Head: Normocephalic and atraumatic.  Right Ear: External ear normal.  Left Ear: External ear normal.  Nose: Nose normal. No mucosal edema or rhinorrhea.  Mouth/Throat: Oropharynx is clear and moist and mucous membranes are normal. No dental abscesses or uvula swelling.  Eyes: Conjunctivae and EOM are normal. Pupils are equal, round, and reactive to light.  Neck: Normal range of motion and full passive range of motion without pain. Neck supple.  Pt has "wheezing" originating over her larynx not her lungs. No audible stridor.  Tender over the Right SCM   Cardiovascular: Normal rate, regular rhythm and normal heart sounds.  Exam reveals no gallop and no friction rub.   No murmur heard. Pulmonary/Chest: Accessory muscle usage present. Tachypnea noted. She is in respiratory  distress. She has decreased breath sounds. She has no wheezes. She has no rhonchi. She has no rales. She exhibits tenderness. She exhibits no crepitus.  Laryngospasm noted.Pt holding her head back, gasping in and out, poor lung expansion.   Abdominal: Soft. Normal appearance and bowel  sounds are normal. She exhibits no distension. There is no tenderness. There is no rebound and no guarding.  Musculoskeletal: Normal range of motion. She exhibits no edema and no tenderness.  Moves all extremities well.   Neurological: She is alert and oriented to person, place, and time. She has normal strength. No cranial nerve deficit.  Pt has shaking of her upper extremities and her body.   Skin: Skin is warm, dry and intact. No rash noted. No erythema. No pallor.  Face intensely flushed  Psychiatric: Her speech is normal and behavior is normal. Her mood appears anxious.    ED Course  Procedures (including critical care time)  Medications  albuterol (PROVENTIL) (5 MG/ML) 0.5% nebulizer solution 5 mg (5 mg Nebulization Given 08/24/13 1135)  ipratropium (ATROVENT) nebulizer solution 0.5 mg (0.5 mg Nebulization Given 08/24/13 1135)  LORazepam (ATIVAN) injection 1 mg (1 mg Intravenous Given 08/24/13 1229)  Racepinephrine HCl 2.25 % nebulizer solution 0.5 mL (0.5 mLs Nebulization Given 08/24/13 1227)  methylPREDNISolone sodium succinate (SOLU-MEDROL) 125 mg/2 mL injection 125 mg (125 mg Intravenous Given 08/24/13 1229)  pantoprazole (PROTONIX) injection 40 mg (40 mg Intravenous Given 08/24/13 1229)  ondansetron (ZOFRAN) injection 4 mg (4 mg Intravenous Given 08/24/13 1229)  oxyCODONE-acetaminophen (PERCOCET/ROXICET) 5-325 MG per tablet 1 tablet (1 tablet Oral Given 08/24/13 1320)  levofloxacin (LEVAQUIN) IVPB 500 mg (500 mg Intravenous New Bag/Given 08/24/13 1319)  diazepam (VALIUM) injection 5 mg (5 mg Intravenous Given 08/24/13 1358)  ketorolac (TORADOL) 30 MG/ML injection 30 mg (30 mg Intravenous Given  08/24/13 1358)     DIAGNOSTIC STUDIES: Oxygen Saturation is 96% on room air, normal by my interpretation.    COORDINATION OF CARE: 12:03 PM- Patient informed of current plan for treatment and evaluation and agrees with plan at this time.   Recheck at 13:20 pt is much calmer, still has some tachypnea, face is normal color now. Still c/o pain in her right neck and anterior chest. States she has been having burning in her epigastric area for awhile. Discussed her test results and need admission for observation. Pt and family are agreeable.  14:07 Dr Irene Limbo, admit to observation med-surg bed Team 2.  Results for orders placed during the hospital encounter of 08/24/13  CBC WITH DIFFERENTIAL      Result Value Range   WBC 7.0  4.0 - 10.5 K/uL   RBC 4.74  3.87 - 5.11 MIL/uL   Hemoglobin 14.5  12.0 - 15.0 g/dL   HCT 53.6  64.4 - 03.4 %   MCV 88.2  78.0 - 100.0 fL   MCH 30.6  26.0 - 34.0 pg   MCHC 34.7  30.0 - 36.0 g/dL   RDW 74.2  59.5 - 63.8 %   Platelets 235  150 - 400 K/uL   Neutrophils Relative % 58  43 - 77 %   Neutro Abs 4.1  1.7 - 7.7 K/uL   Lymphocytes Relative 30  12 - 46 %   Lymphs Abs 2.1  0.7 - 4.0 K/uL   Monocytes Relative 8  3 - 12 %   Monocytes Absolute 0.6  0.1 - 1.0 K/uL   Eosinophils Relative 3  0 - 5 %   Eosinophils Absolute 0.2  0.0 - 0.7 K/uL   Basophils Relative 1  0 - 1 %   Basophils Absolute 0.1  0.0 - 0.1 K/uL  COMPREHENSIVE METABOLIC PANEL      Result Value Range   Sodium 141  135 - 145 mEq/L  Potassium 3.7  3.5 - 5.1 mEq/L   Chloride 101  96 - 112 mEq/L   CO2 26  19 - 32 mEq/L   Glucose, Bld 135 (*) 70 - 99 mg/dL   BUN 10  6 - 23 mg/dL   Creatinine, Ser 1.61  0.50 - 1.10 mg/dL   Calcium 09.6  8.4 - 04.5 mg/dL   Total Protein 7.7  6.0 - 8.3 g/dL   Albumin 4.2  3.5 - 5.2 g/dL   AST 18  0 - 37 U/L   ALT 24  0 - 35 U/L   Alkaline Phosphatase 82  39 - 117 U/L   Total Bilirubin 0.3  0.3 - 1.2 mg/dL   GFR calc non Af Amer >90  >90 mL/min   GFR calc  Af Amer >90  >90 mL/min  TROPONIN I      Result Value Range   Troponin I <0.30  <0.30 ng/mL    Imaging Review Dg Chest Portable 1 View  08/24/2013   CLINICAL DATA:  Short of breath, chest pain  EXAM: PORTABLE CHEST - 1 VIEW  COMPARISON:  Prior chest x-ray 07/14/2013  FINDINGS: Developing right middle lobe opacity obscures the right cardiac margin. Otherwise, the lungs are clear. A mild prominence of the bibasilar interstitial markings is similar compared to prior studies. No pneumothorax or pleural effusion. No acute osseous abnormality. Cardiac and mediastinal contours are within normal limits.  IMPRESSION: Developing right middle lobe infiltrate concerning for early pneumonia.   Electronically Signed   By: Malachy Moan M.D.   On: 08/24/2013 12:28    EKG Interpretation     Ventricular Rate:  101 PR Interval:  130 QRS Duration: 74 QT Interval:  376 QTC Calculation: 487 R Axis:   52 Text Interpretation:  Sinus tachycardia Nonspecific T wave abnormality When compared with ECG of 14-Jul-2013 11:52, No significant change was found            MDM  patient presents with acute onset of laryngospasm most likely due to GERD. She has not been taking her Pepcid recently. Patient improved with racemic epi nebulizer. She also has some chest wall pain. Patient is being admitted for observation overnight. She also has a possible pneumonia on her chest x-ray and her daughter states she has been having some coughing although her husband says she hasn't.    1. Laryngospasm   2. GERD (gastroesophageal reflux disease)   3. Anxiety   4. CAP (community acquired pneumonia)   5. Muscle spasm    Plan admission    Devoria Albe, MD, FACEP  CRITICAL CARE Performed by: Devoria Albe L Total critical care time: 32 min Critical care time was exclusive of separately billable procedures and treating other patients. Critical care was necessary to treat or prevent imminent or life-threatening  deterioration. Critical care was time spent personally by me on the following activities: development of treatment plan with patient and/or surrogate as well as nursing, discussions with consultants, evaluation of patient's response to treatment, examination of patient, obtaining history from patient or surrogate, ordering and performing treatments and interventions, ordering and review of laboratory studies, ordering and review of radiographic studies, pulse oximetry and re-evaluation of patient's condition.  I personally performed the services described in this documentation, which was scribed in my presence. The recorded information has been reviewed and considered.  Devoria Albe, MD, FACEP   Nichole Givens, MD 08/24/13 1526  Nichole Givens, MD 08/24/13 1538

## 2013-08-24 NOTE — ED Notes (Signed)
Report given to Nedine, RN unit 300. Patient to have CT angio prior to going to floor. Will transfer to floor post testing.

## 2013-08-24 NOTE — H&P (Signed)
History and Physical  [name] ZOX:[id number] DOB: [birth year] DOA: 08/24/2013  Referring physician: Dr. Lars Mage in ED PCP: [name], MD   Chief Complaint: Chest pain  HPI:  52 year old woman presented to the emergency department with complaints of sudden onset of chest pain with radiation to the neck as well as shortness of breath, cough. Initial evaluation notable for developing right lower lobe infiltrate. There was also some concern for possible laryngeal spasm possibly secondary to GERD which was successfully treated. Initial evaluation suggested developing pneumonia.  History obtained from patient at bedside. She has done well lately without chest pain or other problems. Last week she had a procedure done on the left side of her neck which she describes as being some kind of injection for her chronic cervical disc disease. This injection was unsuccessful. Today she developed sudden midsternal chest pain radiating to both sides of her neck, some slight sweating but no diaphoresis, no left arm pain. No specific aggravating or alleviating factors. She became short of breath, had pleuritic pain and developed coughing immediately. According to the chart she also had a shaking episode. She does have a history of GERD and described a burning sensation over the last several days.  In the emergency department  noted to be afebrile, mild tachypnea of 24. Mild tachycardia. No hypoxia. Complete metabolic panel and CBC unremarkable. Chest x-ray suggested open right middle lobe infiltrate concerning for early pneumonia. She was treated with albuterol, Atrovent, Solu-Medrol, Levaquin as well as chronic medications including Percocet. Emergency department physician also treated with epinephrine racemic for possible laryngospasm secondary to GERD.  Currently she feels much better.  Review of Systems:  Negative for fever, visual changes, sore throat, rash, new muscle aches, dysuria, bleeding,  n/v/abdominal pain.  Past Medical History  Diagnosis Date  . GERD (gastroesophageal reflux disease)   . Gastritis   . Fibromyalgia   . HTN (hypertension)   . Edema of lower extremity   . Kidney stone   . Fibromyalgia   . Migraine   . DDD (degenerative disc disease)   . MS (multiple sclerosis)   . Diabetes mellitus   . Neuropathy   . Asthma   . CHF (congestive heart failure)   . COPD (chronic obstructive pulmonary disease)   . Complication of anesthesia   . Hiatal hernia   . Stroke   . Migraine   . Hypothyroidism   . Gout     Past Surgical History  Procedure Laterality Date  . Fundic gland polyp      benign  . Sigmoidoscopy  01/31/08    large internal hemorrhoids/small rectal polyp removed/rare sigmoid diverticula  . Esophagogastroduodenoscopy  10/29/07    normal  . Cholecystectomy    . Bladder suspension    . Abdominal hysterectomy    . Carpal tunnel release      right    Social History:  reports that she has been smoking Cigarettes.  She has been smoking about 0.50 packs per day. She quit smokeless tobacco use about 13 months ago. She reports that she does not drink alcohol or use illicit drugs.  Allergies  Allergen Reactions  . Sulfa Antibiotics Anaphylaxis  . Demerol [Meperidine] Swelling  . Doxycycline Other (See Comments)    Sick to stomach  . Penicillins Swelling    Family History  Problem Relation Age of Onset  . Diabetes Mother      Prior to Admission medications   Medication Sig Start Date End Date Taking?  Authorizing Provider  albuterol (PROVENTIL HFA;VENTOLIN HFA) 108 (90 BASE) MCG/ACT inhaler Inhale 2 puffs into the lungs every 6 (six) hours as needed. Shortness of breath.   Yes Historical Provider, MD  ALPRAZolam Prudy Feeler) 0.5 MG tablet Take 0.5-1 mg by mouth Twice daily. 1 tablet in the morning and 2 tablets at bedtime. 05/31/12  Yes Historical Provider, MD  citalopram (CELEXA) 40 MG tablet Take 40 mg by mouth daily.   Yes Historical Provider,  MD  cyclobenzaprine (FLEXERIL) 10 MG tablet Take 10 mg by mouth Every 6 hours as needed for muscle spasms.  06/01/12  Yes Historical Provider, MD  Fluticasone-Salmeterol (ADVAIR) 250-50 MCG/DOSE AEPB Inhale 1 puff into the lungs 2 (two) times daily as needed (shortness of breath).    Yes Historical Provider, MD  furosemide (LASIX) 20 MG tablet Take 20 mg by mouth daily.   Yes Historical Provider, MD  levothyroxine (SYNTHROID, LEVOTHROID) 125 MCG tablet Take 125-187.5 mcg by mouth See admin instructions. Take 1 tablet (125 mcg total) on Sun Tue Wed Fri Sat. 1.5 tabs (187.3mcg) on Monday and Thursday 08/13/12  Yes Srikar Cherlynn Kaiser, MD  lisinopril (PRINIVIL,ZESTRIL) 40 MG tablet Take 40 mg by mouth daily.   Yes Historical Provider, MD  oxyCODONE-acetaminophen (PERCOCET) 10-325 MG per tablet Take 1 tablet by mouth 4 times daily. 05/16/12  Yes Historical Provider, MD   Physical Exam: Filed Vitals:   08/24/13 1127 08/24/13 1135 08/24/13 1200 08/24/13 1227  BP: 183/82     Pulse: 101  106   Temp: 98.6 F (37 C)     TempSrc: Oral     Resp: 24  24   SpO2: 96% 96% 94% 95%    General: Examined in the emergency department. Appears anxious, mildly uncomfortable. Nontoxic. Eyes: PERRL, normal lids, irises  ENT: grossly normal hearing, lips & tongue Neck: no LAD, masses or thyromegaly Cardiovascular: RRR, no m/r/g. No LE edema. Radial pulses 2+ bilaterally. Respiratory: CTA bilaterally, no w/r/r. Mild increased respiratory effort. No stridor or laryngospasm. Abdomen: soft, ntnd Skin: no rash or induration seen  Musculoskeletal: grossly normal tone BUE/BLE Psychiatric: Appears anxious, speech fluent and appropriate Neurologic: grossly non-focal.  Wt Readings from Last 3 Encounters:  05/16/13 63.05 kg (139 lb)  12/28/12 64.864 kg (143 lb)  08/13/12 68.675 kg (151 lb 6.4 oz)    Labs on Admission:  Basic Metabolic Panel:  Recent Labs Lab 08/24/13 1212  NA 141  K 3.7  CL 101  CO2 26  GLUCOSE  135*  BUN 10  CREATININE 0.64  CALCIUM 10.2    Liver Function Tests:  Recent Labs Lab 08/24/13 1212  AST 18  ALT 24  ALKPHOS 82  BILITOT 0.3  PROT 7.7  ALBUMIN 4.2   CBC:  Recent Labs Lab 08/24/13 1212  WBC 7.0  NEUTROABS 4.1  HGB 14.5  HCT 41.8  MCV 88.2  PLT 235    Cardiac Enzymes:  Recent Labs Lab 08/24/13 1212  TROPONINI <0.30    Radiological Exams on Admission: Dg Chest Portable 1 View  08/24/2013   CLINICAL DATA:  Short of breath, chest pain  EXAM: PORTABLE CHEST - 1 VIEW  COMPARISON:  Prior chest x-ray 07/14/2013  FINDINGS: Developing right middle lobe opacity obscures the right cardiac margin. Otherwise, the lungs are clear. A mild prominence of the bibasilar interstitial markings is similar compared to prior studies. No pneumothorax or pleural effusion. No acute osseous abnormality. Cardiac and mediastinal contours are within normal limits.  IMPRESSION: Developing right middle  lobe infiltrate concerning for early pneumonia.   Electronically Signed   By: Malachy Moan M.D.   On: 08/24/2013 12:28    EKG: Independently reviewed. ST, NSST.   Principal Problem:   CAP (community acquired pneumonia) Active Problems:   DM (diabetes mellitus)   GERD (gastroesophageal reflux disease)   COPD (chronic obstructive pulmonary disease)   Chronic pain syndrome   Cigarette smoker   Assessment/Plan 1. CAP: admit for IV abx. Hypoxic. Mild tachypnea only. 2. Atypical chest pain: Suspect secondary to pneumonia/GERD. However, her description of central chest pain with radiation and pleuritic features would be atypical for right-sided pneumonia. Given some pleuritic features, check CT angiogram to exclude PE. Serial cardiac enzymes. Telemetry. If workup negative, no further evaluation and he suggested. EKG not acute. Initial troponin negative. Doubt ACS. Pulses equal bilaterally. No evidence to suggest thoracic vascular issue. 3. GERD: With possible induced  laryngospasm--monitor for recurrence, hold off on further steroids. Continue Pepcid. 4. DM: SSI. 5. Chronic pain syndrome: continue chronic Percocet 6. COPD: nebulizers, oxygen therapy as needed 7. Ongoing cigarette smoking  Code Status: full code DVT prophylaxis: SCDs Family Communication: Discussed with daughter at bedside Disposition Plan/Anticipated LOS: obs, 2 days  Time spent: 55 minutes  Brendia Sacks, MD  [facility] Pager [phone] 08/24/2013, 2:05 PM

## 2013-08-24 NOTE — ED Notes (Signed)
Patient states she "cannot stand the pain". Requesting pain medication. MD aware.

## 2013-08-24 NOTE — Progress Notes (Signed)
52yo female on Levaquin 750mg  q24h.  Clcr > 60 ml/min therefore no dosage adjustment needed.   Plan: continue current dose  S. Margo Aye, PharmD

## 2013-08-24 NOTE — ED Notes (Signed)
Patient with two noted periods of apnea. Responds to sternal rub. MD aware.

## 2013-08-24 NOTE — ED Notes (Signed)
Respiratory at bedside for breathing treatment.

## 2013-08-24 NOTE — ED Notes (Addendum)
CT worried about IV patency. New IV placed in left posterior forearm. IV in LAC patent.

## 2013-08-24 NOTE — ED Notes (Addendum)
Pt arrived by car, unable to get out of car on her own, assisted by staff/security, audible wheezing noted, family stated that she got sick several minutes ago. Walked ut of [employer], husband stated she laid back in the seat of car and c.o cp and unable to breath. Pt unable to speak clearly at arrival, husband answered most ?'s

## 2013-08-25 DIAGNOSIS — G894 Chronic pain syndrome: Secondary | ICD-10-CM

## 2013-08-25 LAB — EXPECTORATED SPUTUM ASSESSMENT W GRAM STAIN, RFLX TO RESP C: Special Requests: NORMAL

## 2013-08-25 LAB — STREP PNEUMONIAE URINARY ANTIGEN: Strep Pneumo Urinary Antigen: NEGATIVE

## 2013-08-25 LAB — TROPONIN I
Troponin I: <0.3 ng/mL (ref <0.30)
Troponin I: <0.3 ng/mL (ref <0.30)

## 2013-08-25 MED ORDER — FAMOTIDINE 20 MG PO TABS
20.0000 mg | ORAL_TABLET | Freq: Two times a day (BID) | ORAL | Status: DC
  Administered 2013-08-25 – 2013-08-27 (×5): 20 mg via ORAL
  Filled 2013-08-25 (×5): qty 1

## 2013-08-25 NOTE — Progress Notes (Signed)
[facility] PROGRESS NOTE  [name] AOZ:[id number] DOB: Jul 04, 1961 DOA: 08/24/2013 PCP: [name], MD  Assessment/Plan: 1. Community acquired pneumonia: Improving. Continue antibiotics. 2. Atypical chest pain: Nearly resolved. Likely secondary to pneumonia, coughing, GERD. Troponin is negative. EKG not acute. CT angiogram of the chest unremarkable. No further evaluation suggested. 3. GERD: Possible-induced laryngospasm, no recurrence. Continue H2 blocker 4. Diabetes mellitus type 2: Continue sliding scale insulin. Diet controlled. 5. Chronic pain syndrome: Continue Percocet, Flexeril. 6. COPD: Appears stable. Continue nebulizers, oxygen therapy as needed 7. Ongoing cigarette smoking: Recommend cessation on discharge.                                                   Continue empiric antibiotics. Followup cultures.   Likely home 10/20  Pending studies:   Sputum culture   Legionella antigen, Streptococcus pneumoniae antigen  Code Status: full code DVT prophylaxis: Lovenox Family Communication: none present Disposition Plan: home 1-2 days  Brendia Sacks, MD  [facility]  Pager [phone] If 7PM-7AM, please contact night-coverage at [email or web address], password [facility] 08/25/2013, 8:58 AM  LOS: 1 day   Summary: 52 year old woman presented to the emergency department with complaints of sudden onset of chest pain with radiation to the neck as well as shortness of breath, cough. Initial evaluation notable for developing right lower lobe infiltrate. There was also some concern for possible laryngeal spasm possibly secondary to GERD which was successfully treated. Initial evaluation suggested developing pneumonia.  Consultants:  None   Procedures:  none  Antibiotics:  Levaquin 10/18 >>  HPI/Subjective: Still has some cough. Breathing better today. Overall feels better today. Still has some right-sided neck pain, muscle spasm. Chest pain nearly  resolved.  Objective: Filed Vitals:   08/24/13 2218 08/25/13 0308 08/25/13 0621 08/25/13 0806  BP: 99/59  110/58   Pulse: 108  80   Temp: 98.3 F (36.8 C)  98.7 F (37.1 C)   TempSrc: Oral  Oral   Resp: 24  24   Height:      Weight:      SpO2: 93% 94% 96% 96%    Intake/Output Summary (Last 24 hours) at 08/25/13 0858 Last data filed at 08/25/13 0746  Gross per 24 hour  Intake      0 ml  Output    750 ml  Net   -750 ml     Filed Weights   08/24/13 1738  Weight: 65 kg (143 lb 4.8 oz)    Exam:   Remains afebrile, vital signs are stable. No hypoxia.  General: Appears more calm today. Comfortable. Nontoxic.   Neck: Spasm of the right sternocleidomastoid muscle noted.   Psychiatric: Grossly normal mood and affect. Speech fluent and appropriate.  Cardiovascular: Regular rate and rhythm. No murmur, rub, gallop. No lower extremity edema.  Telemetry: Sinus rhythm.  Respiratory: Clear to auscultation bilaterally. No frank wheezes, rales or rhonchi. Normal respiratory effort.  Data Reviewed:  Troponins negative  CT angiogram of the chest--no evidence of PE or vascular abnormality.  Scheduled Meds: . albuterol  2.5 mg Nebulization Q6H  . ALPRAZolam  0.5 mg Oral q morning - 10a  . ALPRAZolam  1 mg Oral QHS  . aspirin EC  325 mg Oral Daily  . citalopram  40 mg Oral Daily  . famotidine  20 mg Oral BID  . furosemide  20 mg Oral Daily  . ipratropium  0.5 mg Nebulization Q6H  . levofloxacin (LEVAQUIN) IV  750 mg Intravenous Q24H  . levothyroxine  125 mcg Oral Custom  . [START ON 08/26/2013] levothyroxine  187.5 mcg Oral Custom  . lisinopril  40 mg Oral Daily  . sodium chloride  3 mL Intravenous Q12H  . sodium chloride  3 mL Intravenous Q12H   Continuous Infusions:   Principal Problem:   CAP (community acquired pneumonia) Active Problems:   DM (diabetes mellitus)   GERD (gastroesophageal reflux disease)   COPD (chronic obstructive pulmonary disease)   Chronic  pain syndrome   Cigarette smoker   Chest pain   Time spent 15 minutes

## 2013-08-26 LAB — LEGIONELLA ANTIGEN, URINE

## 2013-08-26 MED ORDER — LEVOFLOXACIN 750 MG PO TABS
750.0000 mg | ORAL_TABLET | Freq: Every day | ORAL | Status: DC
  Administered 2013-08-27: 750 mg via ORAL
  Filled 2013-08-26: qty 1

## 2013-08-26 MED ORDER — IPRATROPIUM BROMIDE 0.02 % IN SOLN
0.5000 mg | RESPIRATORY_TRACT | Status: DC
  Administered 2013-08-26 (×2): 0.5 mg via RESPIRATORY_TRACT
  Filled 2013-08-26 (×2): qty 2.5

## 2013-08-26 MED ORDER — MENTHOL 3 MG MT LOZG
1.0000 | LOZENGE | OROMUCOSAL | Status: DC | PRN
  Administered 2013-08-26: 3 mg via ORAL
  Filled 2013-08-26: qty 9

## 2013-08-26 MED ORDER — ALBUTEROL SULFATE (5 MG/ML) 0.5% IN NEBU
2.5000 mg | INHALATION_SOLUTION | RESPIRATORY_TRACT | Status: DC
  Administered 2013-08-26 (×2): 2.5 mg via RESPIRATORY_TRACT
  Filled 2013-08-26 (×2): qty 0.5

## 2013-08-26 MED ORDER — PHENOL 1.4 % MT LIQD
1.0000 | OROMUCOSAL | Status: DC | PRN
  Administered 2013-08-26: 1 via OROMUCOSAL
  Filled 2013-08-26: qty 177

## 2013-08-26 MED ORDER — MORPHINE SULFATE 2 MG/ML IJ SOLN
1.0000 mg | INTRAMUSCULAR | Status: DC | PRN
  Administered 2013-08-26 – 2013-08-27 (×4): 1 mg via INTRAVENOUS
  Filled 2013-08-26 (×4): qty 1

## 2013-08-26 NOTE — Progress Notes (Signed)
Pt had taken sleeping med's no neb given

## 2013-08-26 NOTE — Progress Notes (Signed)
Patient requested that morphine and percocet be brought to her at 4am for a sore throat and neck. Rated pain at an 8 at 0315. Went into the room at 0400 and patient was asleep. Turned on the lights and patient remained asleep. Doesn't appear to be in distress. I will not give the PRN medications at this time. Will continue to monitor pt for pain.

## 2013-08-26 NOTE — Care Management Note (Unsigned)
    Page 1 of 1   08/26/2013     2:17:33 PM   CARE MANAGEMENT NOTE 08/26/2013  Patient:  [name], [name]   Account Number:  [id number]  Date Initiated:  08/26/2013  Documentation initiated by:  Rosemary Holms  Subjective/Objective Assessment:   pt admitted from home whre she lives with her husband. States she is not feeling well. Ate all her breakfast. Anticipate DC home with no HH/DME needs     Action/Plan:   Anticipated DC Date:  08/27/2013   Anticipated DC Plan:  HOME/SELF CARE      DC Planning Services  CM consult      Choice offered to / List presented to:             Status of service:  Completed, signed off Medicare Important Message given?   (If response is "NO", the following Medicare IM given date fields will be blank) Date Medicare IM given:   Date Additional Medicare IM given:    Discharge Disposition:    Per UR Regulation:    If discussed at Long Length of Stay Meetings, dates discussed:    Comments:  08/26/13 Rosemary Holms RN BSN CM

## 2013-08-26 NOTE — Progress Notes (Signed)
[facility] PROGRESS NOTE  [name]:[id number] DOB: 04-04-1961 DOA: 08/24/2013 PCP: [name], MD  Assessment/Plan: 1. Community acquired pneumonia: Improving slowly.she is afebrile and non-toxic appearing. sats within the limits of normal. Levaquin day #2.  2. Atypical chest pain: Patient report "soreness" all over. Worse with movement. Likely secondary to pneumonia, coughing, GERD. Troponin is negative x3. EKG not acute. CT angiogram of the chest unremarkable. No further evaluation suggested. 3. GERD: Possible-induced laryngospasm. Continue H2 blocker. 4. Diabetes mellitus type 2: Continue sliding scale insulin. Diet controlled. 5. Chronic pain syndrome: Continue Percocet, Flexeril. Of note, patient takes 10mg  flexeril QID at home as well as Percocet QID.  6. COPD: Appears stable. BS slightly diminished with some tightness. Change  nebulizers to q 4 hours. sats >90% on room air.  7. Ongoing cigarette smoking: Recommend cessation on discharge.  Code Status: full Family Communication: daughter at bedside Disposition Plan: home in am   Consultants:  none  Procedures:  none  Antibiotics:  Levaquin 08/26/13>>>  HPI/Subjective: Lying in bed moaning. Reports "sore all over".   Objective: Filed Vitals:   08/26/13 0500  BP: 96/53  Pulse: 71  Temp: 98.4 F (36.9 C)  Resp: 18    Intake/Output Summary (Last 24 hours) at 08/26/13 1547 Last data filed at 08/26/13 0900  Gross per 24 hour  Intake    480 ml  Output      0 ml  Net    480 ml   Filed Weights   08/24/13 1738  Weight: 65 kg (143 lb 4.8 oz)    Exam:   General:  Face flushed, well nourished NAD  Cardiovascular: RRR No MGR No LE edema  Respiratory: normal effort. BS slightly diminished in bases. Slight  Forces wheeze anteriorly no crackles or rhonchi  Abdomen: flat soft +BS non-tender to palpation  Musculoskeletal: no clubbing no cyanosis   Data Reviewed: Basic Metabolic  Panel:  Recent Labs Lab 08/24/13 1212  NA 141  K 3.7  CL 101  CO2 26  GLUCOSE 135*  BUN 10  CREATININE 0.64  CALCIUM 10.2   Liver Function Tests:  Recent Labs Lab 08/24/13 1212  AST 18  ALT 24  ALKPHOS 82  BILITOT 0.3  PROT 7.7  ALBUMIN 4.2   No results found for this basename: LIPASE, AMYLASE,  in the last 168 hours No results found for this basename: AMMONIA,  in the last 168 hours CBC:  Recent Labs Lab 08/24/13 1212  WBC 7.0  NEUTROABS 4.1  HGB 14.5  HCT 41.8  MCV 88.2  PLT 235   Cardiac Enzymes:  Recent Labs Lab 08/24/13 1212 08/24/13 1802 08/24/13 2354 08/25/13 0540  TROPONINI <0.30 <0.30 <0.30 <0.30   BNP (last 3 results)  Recent Labs  12/28/12 1002  PROBNP 97.2   CBG: No results found for this basename: GLUCAP,  in the last 168 hours  Recent Results (from the past 240 hour(s))  CULTURE, EXPECTORATED SPUTUM-ASSESSMENT     Status: None   Collection Time    08/25/13  7:54 AM      Result Value Range Status   Specimen Description SPUTUM   Final   Special Requests NONE   Final   Sputum evaluation     Final   Value: MICROSCOPIC FINDINGS SUGGEST THAT THIS SPECIMEN IS NOT REPRESENTATIVE OF LOWER RESPIRATORY SECRETIONS. PLEASE RECOLLECT.     Gram Stain Report Called to,Read Back By and Verified With: HEATH S. AT [id number] ON [id number] BY THOMPSON S.  Report Status 08/25/2013 FINAL   Final  CULTURE, EXPECTORATED SPUTUM-ASSESSMENT     Status: None   Collection Time    08/25/13  4:00 PM      Result Value Range Status   Specimen Description SPUTUM   Final   Special Requests Normal   Final   Sputum evaluation     Final   Value: THIS SPECIMEN IS ACCEPTABLE. RESPIRATORY CULTURE REPORT TO FOLLOW.   Report Status 08/25/2013 FINAL   Final  CULTURE, RESPIRATORY (NON-EXPECTORATED)     Status: None   Collection Time    08/25/13  4:00 PM      Result Value Range Status   Specimen Description SPUTUM   Final   Special Requests NONE   Final   Gram Stain      Final   Value: NO WBC SEEN     NO SQUAMOUS EPITHELIAL CELLS SEEN     RARE GRAM POSITIVE COCCI     IN CLUSTERS RARE GRAM POSITIVE RODS     RARE YEAST   Culture     Final   Value: Culture reincubated for better growth     Performed at [facility]   Report Status PENDING   Incomplete     Studies: Ct Angio Chest Pe W/cm &/or Wo Cm  08/24/2013   CLINICAL DATA:  Short of breath  EXAM: CT ANGIOGRAPHY CHEST WITH CONTRAST  TECHNIQUE: Multidetector CT imaging of the chest was performed using the standard protocol during bolus administration of intravenous contrast. Multiplanar CT image reconstructions including MIPs were obtained to evaluate the vascular anatomy.  CONTRAST:  OMNIPAQUE IOHEXOL 350 MG/ML SOLN  COMPARISON:  None.  FINDINGS: There are no filling defects in the pulmonary arterial tree to suggest acute pulmonary thromboembolism  Atherosclerotic calcification at the origin of the left vertebral artery.  No evidence of aortic dissection or aneurysm.  No pericardial effusion or abnormal adenopathy.  No pneumothorax or pleural effusion.  Clear lungs.  No acute bony deformity.  Review of the MIP images confirms the above findings.  IMPRESSION: No evidence of acute pulmonary thromboembolism.   Electronically Signed   By: Maryclare Bean M.D.   On: 08/24/2013 17:27    Scheduled Meds: . albuterol  2.5 mg Nebulization Q4H  . ALPRAZolam  0.5 mg Oral q morning - 10a  . ALPRAZolam  1 mg Oral QHS  . aspirin EC  325 mg Oral Daily  . citalopram  40 mg Oral Daily  . famotidine  20 mg Oral BID  . furosemide  20 mg Oral Daily  . ipratropium  0.5 mg Nebulization Q6H  . [START ON 08/27/2013] levofloxacin  750 mg Oral Daily  . levothyroxine  125 mcg Oral Custom  . levothyroxine  187.5 mcg Oral Custom  . lisinopril  40 mg Oral Daily  . sodium chloride  3 mL Intravenous Q12H  . sodium chloride  3 mL Intravenous Q12H   Continuous Infusions:   Principal Problem:   CAP (community acquired  pneumonia) Active Problems:   DM (diabetes mellitus)   GERD (gastroesophageal reflux disease)   COPD (chronic obstructive pulmonary disease)   Chronic pain syndrome   Cigarette smoker   Chest pain    Time spent: 30 minutes    [facility] M  [facility] Pager [phone]. If 7PM-7AM, please contact night-coverage at [email or web address], password [facility] 08/26/2013, 3:47 PM  LOS: 2 days

## 2013-08-26 NOTE — Progress Notes (Signed)
Nutrition Brief Note  Patient identified on the Malnutrition Screening Tool (MST) Report  Wt Readings from Last 15 Encounters:  08/24/13 143 lb 4.8 oz (65 kg)  05/16/13 139 lb (63.05 kg)  12/28/12 143 lb (64.864 kg)  08/13/12 151 lb 6.4 oz (68.675 kg)  08/06/12 151 lb (68.493 kg)  weight loss noted (5%,8#) x 12 months, not significant  Body mass index is 23.85 kg/(m^2). Patient meets criteria for normal range based on current BMI.   Current diet order is Regular, patient is consuming approximately 75% of meals at this time. Labs and medications reviewed.   No nutrition interventions warranted at this time. If nutrition issues arise, please consult RD.   Royann Shivers MS,RD,LDN,CSG Office: [phone] Pager: [phone]

## 2013-08-26 NOTE — Progress Notes (Signed)
PHARMACIST - PHYSICIAN COMMUNICATION DR:   [place] CONCERNING: Antibiotic IV to Oral Route Change Policy  RECOMMENDATION: This patient is receiving Levaquin by the intravenous route.  Based on criteria approved by the Pharmacy and Therapeutics Committee, the antibiotic(s) is/are being converted to the equivalent oral dose form(s).   DESCRIPTION: These criteria include:  Patient being treated for a respiratory tract infection, urinary tract infection, cellulitis or clostridium difficile associated diarrhea if on metronidazole  The patient is not neutropenic and does not exhibit a GI malabsorption state  The patient is eating (either orally or via tube) and/or has been taking other orally administered medications for a least 24 hours  The patient is improving clinically and has a Tmax < 100.5  If you have questions about this conversion, please contact the Pharmacy Department  [x]  ( [phone] )  [place] []  ( [phone] )  [place]  []  ( [phone] )  [facility] []  ( [phone] )  [facility]   S. Laury Huizar, PharmD 

## 2013-08-26 NOTE — Progress Notes (Signed)
Pt ambulated in hallway with standby assistance. Pt tolerated well. No c/o SOB or pain with ambulation. Pt ambulated approximately 100 feet.

## 2013-08-26 NOTE — Progress Notes (Signed)
Pt gargled with warm salt water per NP order.

## 2013-08-26 NOTE — Progress Notes (Signed)
I have seen and assessed patient and I agree [name], nurse practitioner's assessment and plan. Patient currently being treated for community-acquired pneumonia and improving slowly. Currently on Levaquin. Patient with complaints of soreness all over and also complaining of some sore throat. Will place on some cepacor lozenges, Chloraseptic spray. Continue antibiotics. Continue scheduled nebulizer treatments. Pain management.

## 2013-08-27 ENCOUNTER — Encounter (HOSPITAL_COMMUNITY): Payer: Self-pay | Admitting: Family Medicine

## 2013-08-27 LAB — CBC
MCV: 91.7 fL (ref 78.0–100.0)
Platelets: 193 10*3/uL (ref 150–400)
RBC: 4.12 MIL/uL (ref 3.87–5.11)
WBC: 6.4 10*3/uL (ref 4.0–10.5)

## 2013-08-27 LAB — CULTURE, RESPIRATORY

## 2013-08-27 LAB — CULTURE, RESPIRATORY W GRAM STAIN: Culture: NORMAL

## 2013-08-27 LAB — BASIC METABOLIC PANEL
CO2: 34 mEq/L — ABNORMAL HIGH (ref 19–32)
Calcium: 9.2 mg/dL (ref 8.4–10.5)
GFR calc non Af Amer: 82 mL/min — ABNORMAL LOW (ref >90)
Sodium: 141 mEq/L (ref 135–145)

## 2013-08-27 MED ORDER — FAMOTIDINE 20 MG PO TABS: 20.0000 mg | ORAL_TABLET | Freq: Two times a day (BID) | ORAL | Status: DC

## 2013-08-27 MED ORDER — IPRATROPIUM BROMIDE 0.02 % IN SOLN
0.5000 mg | Freq: Three times a day (TID) | RESPIRATORY_TRACT | Status: DC
  Administered 2013-08-27: 0.5 mg via RESPIRATORY_TRACT
  Filled 2013-08-27: qty 2.5

## 2013-08-27 MED ORDER — ALBUTEROL SULFATE (5 MG/ML) 0.5% IN NEBU
2.5000 mg | INHALATION_SOLUTION | Freq: Three times a day (TID) | RESPIRATORY_TRACT | Status: DC
  Administered 2013-08-27: 2.5 mg via RESPIRATORY_TRACT
  Filled 2013-08-27: qty 0.5

## 2013-08-27 MED ORDER — LEVOFLOXACIN 750 MG PO TABS: 750.0000 mg | ORAL_TABLET | Freq: Every day | ORAL | Status: DC

## 2013-08-27 NOTE — Discharge Summary (Signed)
Physician Discharge Summary  [name] GMW:[id number] DOB: 1961/05/12 DOA: 08/24/2013  PCP: [name], MD  Admit date: 08/24/2013 Discharge date: 08/27/2013  Time spent: 40 minutes  Recommendations for Outpatient Follow-up:  1. Follow up with PCP in 1 week for evaluation of symptoms  Discharge Diagnoses:  Principal Problem:   CAP (community acquired pneumonia) Active Problems:   DM (diabetes mellitus)   GERD (gastroesophageal reflux disease)   COPD (chronic obstructive pulmonary disease)   Chronic pain syndrome   Cigarette smoker   Chest pain   Discharge Condition: stable  Diet recommendation: regular  Filed Weights   08/24/13 1738  Weight: 65 kg (143 lb 4.8 oz)    History of present illness:  52 year old woman presented to the emergency department on 08/24/13 with complaints of sudden onset of chest pain with radiation to the neck as well as shortness of breath, cough. Initial evaluation notable for developing right lower lobe infiltrate. There was also some concern for possible laryngeal spasm possibly secondary to GERD which was successfully treated. Initial evaluation suggested developing pneumonia. Patient stated she had done well lately without chest pain or other problems. The week prior she had a procedure done on the left side of her neck which she describes as being some kind of injection for her chronic cervical disc disease. This injection was unsuccessful. She developed sudden midsternal chest pain radiating to both sides of her neck, some slight sweating but no diaphoresis, no left arm pain. No specific aggravating or alleviating factors. She became short of breath, had pleuritic pain and developed coughing. According to the chart she also had a shaking episode. She has a history of GERD and described a burning sensation over the last several days. In the emergency department noted to be afebrile, mild tachypnea of 24. Mild tachycardia. No hypoxia. Complete  metabolic panel and CBC unremarkable. Chest x-ray suggested open right middle lobe infiltrate concerning for early pneumonia. She was treated with albuterol, Atrovent, Solu-Medrol, Levaquin as well as chronic medications including Percocet. Emergency department physician also treated with epinephrine racemic for possible laryngospasm secondary to GERD.    Hospital Course:  1. Community acquired pneumonia: admitted to floor.Patient remained afebrile and non-toxic appearing. Strep pneumo and legionella urinary antigen both negative. Provided with levaquin and nebs. She slowly improved. Will discharge with 5 more days of levaquin to complete 7 days course. Recommend follow up with PCP 1 week for evaluation of symptoms 2. Atypical chest pain: Nearly resolved. Likely secondary to pneumonia, coughing, GERD. Troponin is negative. EKG not acute. CT angiogram of the chest unremarkable. No further evaluation suggested. 3. GERD: Possible-induced laryngospasm, no recurrence. Provided with  H2 blocker. Also provided with cepacor lozenges and chloraseptic spray. Some improvement at discharge 4. Diabetes mellitus type 2: controlled. Diet controlled.  5. Chronic pain syndrome: Continue Percocet, Flexeril.  6. COPD: Remained stable during this hospitalization. Sats >90% on room air.  7. Ongoing cigarette smoking: Recommended cessation on discharge.   Procedures:  none  Consultations:  none  Discharge Exam: Filed Vitals:   08/27/13 0554  BP: 117/67  Pulse: 63  Temp: 97.8 F (36.6 C)  Resp: 18    General: calm, comfortable. NAD Cardiovascular: RRR No MGR No LE edema Respiratory: no increased work of breathing with ambulation. BS with good air flow. No wheeze or crackles  Discharge Instructions  Discharge Orders   Future Orders Complete By Expires   Diet - low sodium heart healthy  As directed    Discharge  instructions  As directed    Comments:     Take medication as directed   Increase  activity slowly  As directed        Medication List         albuterol 108 (90 BASE) MCG/ACT inhaler  Commonly known as:  PROVENTIL HFA;VENTOLIN HFA  Inhale 2 puffs into the lungs every 6 (six) hours as needed. Shortness of breath.     ALPRAZolam 0.5 MG tablet  Commonly known as:  XANAX  Take 0.5-1 mg by mouth Twice daily. 1 tablet in the morning and 2 tablets at bedtime.     citalopram 40 MG tablet  Commonly known as:  CELEXA  Take 40 mg by mouth daily.     cyclobenzaprine 10 MG tablet  Commonly known as:  FLEXERIL  Take 10 mg by mouth Every 6 hours as needed for muscle spasms.     famotidine 20 MG tablet  Commonly known as:  PEPCID  Take 1 tablet (20 mg total) by mouth 2 (two) times daily.     Fluticasone-Salmeterol 250-50 MCG/DOSE Aepb  Commonly known as:  ADVAIR  Inhale 1 puff into the lungs 2 (two) times daily as needed (shortness of breath).     furosemide 20 MG tablet  Commonly known as:  LASIX  Take 20 mg by mouth daily.     levofloxacin 750 MG tablet  Commonly known as:  LEVAQUIN  Take 1 tablet (750 mg total) by mouth daily.     levothyroxine 125 MCG tablet  Commonly known as:  SYNTHROID, LEVOTHROID  - Take 125-187.5 mcg by mouth See admin instructions. Take 1 tablet (125 mcg total) on Sun Tue Wed Fri Sat.  - 1.5 tabs (187.28mcg) on Monday and Thursday     lisinopril 40 MG tablet  Commonly known as:  PRINIVIL,ZESTRIL  Take 40 mg by mouth daily.     oxyCODONE-acetaminophen 10-325 MG per tablet  Commonly known as:  PERCOCET  Take 1 tablet by mouth 4 times daily.       Allergies  Allergen Reactions  . Sulfa Antibiotics Anaphylaxis  . Demerol [Meperidine] Swelling  . Doxycycline Other (See Comments)    Sick to stomach  . Penicillins Swelling       Follow-up Information   Follow up with [name], MD. Schedule an appointment as soon as possible for a visit in 1 week. (For evaluation of symptoms)    Specialty:  Internal Medicine   Contact  information:    [address]  [place] [address] [phone]        The results of significant diagnostics from this hospitalization (including imaging, microbiology, ancillary and laboratory) are listed below for reference.    Significant Diagnostic Studies: Ct Angio Chest Pe W/cm &/or Wo Cm  08/24/2013   CLINICAL DATA:  Short of breath  EXAM: CT ANGIOGRAPHY CHEST WITH CONTRAST  TECHNIQUE: Multidetector CT imaging of the chest was performed using the standard protocol during bolus administration of intravenous contrast. Multiplanar CT image reconstructions including MIPs were obtained to evaluate the vascular anatomy.  CONTRAST:  OMNIPAQUE IOHEXOL 350 MG/ML SOLN  COMPARISON:  None.  FINDINGS: There are no filling defects in the pulmonary arterial tree to suggest acute pulmonary thromboembolism  Atherosclerotic calcification at the origin of the left vertebral artery.  No evidence of aortic dissection or aneurysm.  No pericardial effusion or abnormal adenopathy.  No pneumothorax or pleural effusion.  Clear lungs.  No acute bony deformity.  Review of the MIP images confirms the above findings.  IMPRESSION: No evidence of acute pulmonary thromboembolism.   Electronically Signed   By: Maryclare Bean M.D.   On: 08/24/2013 17:27   Dg Chest Portable 1 View  08/24/2013   CLINICAL DATA:  Short of breath, chest pain  EXAM: PORTABLE CHEST - 1 VIEW  COMPARISON:  Prior chest x-ray 07/14/2013  FINDINGS: Developing right middle lobe opacity obscures the right cardiac margin. Otherwise, the lungs are clear. A mild prominence of the bibasilar interstitial markings is similar compared to prior studies. No pneumothorax or pleural effusion. No acute osseous abnormality. Cardiac and mediastinal contours are within normal limits.  IMPRESSION: Developing right middle lobe infiltrate concerning for early pneumonia.   Electronically Signed   By: Malachy Moan M.D.   On: 08/24/2013 12:28     Microbiology: Recent Results (from the past 240 hour(s))  CULTURE, EXPECTORATED SPUTUM-ASSESSMENT     Status: None   Collection Time    08/25/13  7:54 AM      Result Value Range Status   Specimen Description SPUTUM   Final   Special Requests NONE   Final   Sputum evaluation     Final   Value: MICROSCOPIC FINDINGS SUGGEST THAT THIS SPECIMEN IS NOT REPRESENTATIVE OF LOWER RESPIRATORY SECRETIONS. PLEASE RECOLLECT.     Gram Stain Report Called to,Read Back By and Verified With: HEATH S. AT [id number] ON [id number] BY THOMPSON S.   Report Status 08/25/2013 FINAL   Final  CULTURE, EXPECTORATED SPUTUM-ASSESSMENT     Status: None   Collection Time    08/25/13  4:00 PM      Result Value Range Status   Specimen Description SPUTUM   Final   Special Requests Normal   Final   Sputum evaluation     Final   Value: THIS SPECIMEN IS ACCEPTABLE. RESPIRATORY CULTURE REPORT TO FOLLOW.   Report Status 08/25/2013 FINAL   Final  CULTURE, RESPIRATORY (NON-EXPECTORATED)     Status: None   Collection Time    08/25/13  4:00 PM      Result Value Range Status   Specimen Description SPUTUM EXPECTORATED   Final   Special Requests NONE   Final   Gram Stain     Final   Value: NO WBC SEEN     NO SQUAMOUS EPITHELIAL CELLS SEEN     RARE GRAM POSITIVE COCCI     IN CLUSTERS RARE GRAM POSITIVE RODS     RARE YEAST   Culture     Final   Value: NORMAL OROPHARYNGEAL FLORA     Performed at [facility]   Report Status 08/27/2013 FINAL   Final     Labs: Basic Metabolic Panel:  Recent Labs Lab 08/24/13 1212 08/27/13 0539  NA 141 141  K 3.7 3.5  CL 101 101  CO2 26 34*  GLUCOSE 135* 125*  BUN 10 13  CREATININE 0.64 0.80  CALCIUM 10.2 9.2   Liver Function Tests:  Recent Labs Lab 08/24/13 1212  AST 18  ALT 24  ALKPHOS 82  BILITOT 0.3  PROT 7.7  ALBUMIN 4.2   No results found for this basename: LIPASE, AMYLASE,  in the last 168 hours No results found for this basename: AMMONIA,  in the last 168  hours CBC:  Recent Labs Lab 08/24/13 1212 08/27/13 0539  WBC 7.0 6.4  NEUTROABS 4.1  --   HGB 14.5 12.5  HCT 41.8 37.8  MCV 88.2 91.7  PLT 235 193   Cardiac Enzymes:  Recent Labs Lab 08/24/13 1212 08/24/13 1802 08/24/13 2354 08/25/13 0540  TROPONINI <0.30 <0.30 <0.30 <0.30   BNP: BNP (last 3 results)  Recent Labs  12/28/12 1002  PROBNP 97.2   CBG: No results found for this basename: GLUCAP,  in the last 168 hours     Signed:  Gwenyth Bender  [facility] 08/27/2013, 9:25 AM

## 2013-08-27 NOTE — Progress Notes (Signed)
PT Screen Note  Patient Details Name: [name] MRN: [id number] DOB: [birth year] Time: 815-826  ScreenTreatment:    Reason Eval/Treat Not Completed: PT screened, no needs identified, will sign off   Talecia Sherlin 08/27/2013, 8:29 AM

## 2013-08-27 NOTE — Progress Notes (Signed)
IV's removed; patient discharge education provided verbally and written.  Patient vocalized understanding of discharge instruction with no questions at this time.  Patient A/OX4; discharged via wheelchair and NT.

## 2013-08-27 NOTE — Discharge Summary (Signed)
Patient seen, independently examined and chart reviewed. I agree with exam, assessment and plan discussed with Toya Smothers, NP.  Overall continues to improve. Breathing better. Still has some sore throat, hoarse. On exam she appears calm and comfortable, lungs are clear and exam was benign.  She stable for discharge home today, complete Levaquin as an outpatient, resume chronic medications.  Brendia Sacks, MD [facility] [phone]

## 2013-09-11 ENCOUNTER — Other Ambulatory Visit: Payer: Self-pay | Admitting: Specialist

## 2013-09-11 DIAGNOSIS — M479 Spondylosis, unspecified: Secondary | ICD-10-CM

## 2013-09-11 DIAGNOSIS — M545 Low back pain, unspecified: Secondary | ICD-10-CM

## 2013-09-24 ENCOUNTER — Other Ambulatory Visit: Payer: 59

## 2013-09-25 ENCOUNTER — Ambulatory Visit
Admission: RE | Admit: 2013-09-25 | Discharge: 2013-09-25 | Disposition: A | Discharge status: Home / Self Care | Payer: 59 | Source: Ambulatory Visit | Attending: Specialist | Admitting: Specialist

## 2013-09-25 VITALS — BP 156/77 | HR 82

## 2013-09-25 DIAGNOSIS — M545 Low back pain, unspecified: Secondary | ICD-10-CM

## 2013-09-25 DIAGNOSIS — M479 Spondylosis, unspecified: Secondary | ICD-10-CM

## 2013-09-25 MED ORDER — ONDANSETRON HCL 4 MG/2ML IJ SOLN
4.0000 mg | Freq: Once | INTRAMUSCULAR | Status: AC
  Administered 2013-09-25: 4 mg via INTRAMUSCULAR

## 2013-09-25 MED ORDER — HYDROMORPHONE HCL PF 2 MG/ML IJ SOLN
2.0000 mg | Freq: Once | INTRAMUSCULAR | Status: AC
  Administered 2013-09-25: 2 mg via INTRAMUSCULAR

## 2013-09-25 MED ORDER — HYDROMORPHONE HCL PF 1 MG/ML IJ SOLN
1.0000 mg | Freq: Once | INTRAMUSCULAR | Status: AC
  Administered 2013-09-25: 1 mg via INTRAMUSCULAR

## 2013-09-25 MED ORDER — OXYCODONE-ACETAMINOPHEN 5-325 MG PO TABS
2.0000 | ORAL_TABLET | Freq: Once | ORAL | Status: AC
  Administered 2013-09-25: 2 via ORAL

## 2013-09-25 MED ORDER — IOHEXOL 300 MG/ML  SOLN
9.0000 mL | Freq: Once | INTRAMUSCULAR | Status: AC | PRN
  Administered 2013-09-25: 9 mL via INTRATHECAL

## 2013-09-25 MED ORDER — DIAZEPAM 5 MG PO TABS
10.0000 mg | ORAL_TABLET | Freq: Once | ORAL | Status: AC
  Administered 2013-09-25: 10 mg via ORAL

## 2013-09-25 NOTE — Progress Notes (Signed)
Pt states she has been off celexa since Sunday. Discharge instructions explained to pt and daughter.

## 2013-09-25 NOTE — Progress Notes (Signed)
Repositioned for comfort and medicated again for pain.

## 2013-10-20 ENCOUNTER — Encounter (HOSPITAL_COMMUNITY): Payer: Self-pay | Admitting: Emergency Medicine

## 2013-10-20 ENCOUNTER — Emergency Department (HOSPITAL_COMMUNITY)
Admission: EM | Admit: 2013-10-20 | Discharge: 2013-10-20 | Disposition: A | Discharge status: Home / Self Care | Payer: 59 | Attending: Emergency Medicine | Admitting: Emergency Medicine

## 2013-10-20 ENCOUNTER — Emergency Department (HOSPITAL_COMMUNITY): Payer: 59

## 2013-10-20 DIAGNOSIS — Z880 Allergy status to penicillin: Secondary | ICD-10-CM | POA: Insufficient documentation

## 2013-10-20 DIAGNOSIS — J441 Chronic obstructive pulmonary disease with (acute) exacerbation: Secondary | ICD-10-CM | POA: Insufficient documentation

## 2013-10-20 DIAGNOSIS — E039 Hypothyroidism, unspecified: Secondary | ICD-10-CM | POA: Insufficient documentation

## 2013-10-20 DIAGNOSIS — R5381 Other malaise: Secondary | ICD-10-CM | POA: Insufficient documentation

## 2013-10-20 DIAGNOSIS — IMO0002 Reserved for concepts with insufficient information to code with codable children: Secondary | ICD-10-CM | POA: Insufficient documentation

## 2013-10-20 DIAGNOSIS — Z79899 Other long term (current) drug therapy: Secondary | ICD-10-CM | POA: Insufficient documentation

## 2013-10-20 DIAGNOSIS — I509 Heart failure, unspecified: Secondary | ICD-10-CM | POA: Insufficient documentation

## 2013-10-20 DIAGNOSIS — F17200 Nicotine dependence, unspecified, uncomplicated: Secondary | ICD-10-CM | POA: Insufficient documentation

## 2013-10-20 DIAGNOSIS — Z87442 Personal history of urinary calculi: Secondary | ICD-10-CM | POA: Insufficient documentation

## 2013-10-20 DIAGNOSIS — E119 Type 2 diabetes mellitus without complications: Secondary | ICD-10-CM | POA: Insufficient documentation

## 2013-10-20 DIAGNOSIS — I10 Essential (primary) hypertension: Secondary | ICD-10-CM | POA: Insufficient documentation

## 2013-10-20 DIAGNOSIS — Z8673 Personal history of transient ischemic attack (TIA), and cerebral infarction without residual deficits: Secondary | ICD-10-CM | POA: Insufficient documentation

## 2013-10-20 DIAGNOSIS — K219 Gastro-esophageal reflux disease without esophagitis: Secondary | ICD-10-CM | POA: Insufficient documentation

## 2013-10-20 DIAGNOSIS — J40 Bronchitis, not specified as acute or chronic: Secondary | ICD-10-CM

## 2013-10-20 DIAGNOSIS — IMO0001 Reserved for inherently not codable concepts without codable children: Secondary | ICD-10-CM | POA: Insufficient documentation

## 2013-10-20 DIAGNOSIS — G43909 Migraine, unspecified, not intractable, without status migrainosus: Secondary | ICD-10-CM | POA: Insufficient documentation

## 2013-10-20 DIAGNOSIS — J029 Acute pharyngitis, unspecified: Secondary | ICD-10-CM

## 2013-10-20 MED ORDER — IPRATROPIUM BROMIDE 0.02 % IN SOLN
0.5000 mg | Freq: Once | RESPIRATORY_TRACT | Status: AC
  Administered 2013-10-20: 0.5 mg via RESPIRATORY_TRACT
  Filled 2013-10-20: qty 2.5

## 2013-10-20 MED ORDER — ALBUTEROL SULFATE (5 MG/ML) 0.5% IN NEBU
5.0000 mg | INHALATION_SOLUTION | Freq: Once | RESPIRATORY_TRACT | Status: AC
  Administered 2013-10-20: 5 mg via RESPIRATORY_TRACT
  Filled 2013-10-20: qty 1

## 2013-10-20 MED ORDER — HYDROCOD POLST-CHLORPHEN POLST 10-8 MG/5ML PO LQCR
5.0000 mL | Freq: Once | ORAL | Status: AC
  Administered 2013-10-20: 5 mL via ORAL
  Filled 2013-10-20: qty 5

## 2013-10-20 MED ORDER — PREDNISONE 50 MG PO TABS
60.0000 mg | ORAL_TABLET | Freq: Once | ORAL | Status: AC
  Administered 2013-10-20: 60 mg via ORAL
  Filled 2013-10-20 (×2): qty 1

## 2013-10-20 MED ORDER — HYDROCOD POLST-CHLORPHEN POLST 10-8 MG/5ML PO LQCR: 5.0000 mL | Freq: Two times a day (BID) | ORAL | Status: DC

## 2013-10-20 MED ORDER — PREDNISONE 20 MG PO TABS: 60.0000 mg | ORAL_TABLET | Freq: Every day | ORAL | Status: DC

## 2013-10-20 NOTE — ED Notes (Signed)
sorethroat for one week,  Getting worse.

## 2013-10-20 NOTE — ED Provider Notes (Signed)
[name]: [id number]     Arrival date & time 10/20/13  1116 History   First MD Initiated Contact with Patient 10/20/13 1131     Chief Complaint  Patient presents with  . Sore Throat   (Consider location/radiation/quality/duration/timing/severity/associated sxs/prior Treatment) HPI Comments: [name] is a 52 y.o. Female with a history significant for COPD, DM and Genella Rife presenting with a one week history of a nonproductive cough, wheezing and increasing sore throat,  Describing sensation of "knives in my throat" with swallowing and coughing.  She also reports a globus sensation in her lower throat but has been able to maintain PO intake, although with severe pain with swallowing.  Her wheezing has been slightly worse than her normal, but denies any significant increased sob and has had no chest pain.  Additionally, she denies fevers or chills, nasal congestoin or post nasal drip, ear pain, nausea or vomiting, but does have generalized fatigue with the cough keeping her from sleep.  .  She does have a headache which is triggered by coughing. She is utd with her pneumo and influenza vaccines.   The history is provided by the patient.    Past Medical History  Diagnosis Date  . GERD (gastroesophageal reflux disease)   . Gastritis   . Fibromyalgia   . HTN (hypertension)   . Edema of lower extremity   . Kidney stone   . Migraine   . DDD (degenerative disc disease)   . MS (multiple sclerosis)   . Diabetes mellitus   . Neuropathy   . Asthma   . CHF (congestive heart failure)   . COPD (chronic obstructive pulmonary disease)   . Complication of anesthesia   . Hiatal hernia   . Stroke   . Migraine   . Hypothyroidism   . Gout    Past Surgical History  Procedure Laterality Date  . Fundic gland polyp      benign  . Sigmoidoscopy  01/31/08    large internal hemorrhoids/small rectal polyp removed/rare sigmoid diverticula  . Esophagogastroduodenoscopy  10/29/07    normal  . Cholecystectomy     . Bladder suspension    . Abdominal hysterectomy    . Carpal tunnel release      right   Family History  Problem Relation Age of Onset  . Diabetes Mother    History  Substance Use Topics  . Smoking status: Current Every Day Smoker -- 0.50 packs/day    Types: Cigarettes  . Smokeless tobacco: Former Neurosurgeon    Quit date: 07/07/2012  . Alcohol Use: No   OB History   Grav Para Term Preterm Abortions TAB SAB Ect Mult Living                 Review of Systems  Constitutional: Negative for fever and chills.  HENT: Positive for sore throat and trouble swallowing. Negative for congestion, ear pain, postnasal drip, rhinorrhea and voice change.   Eyes: Negative.   Respiratory: Positive for cough, shortness of breath and wheezing. Negative for chest tightness.        Her baseline sob  Cardiovascular: Negative for chest pain.  Gastrointestinal: Negative for nausea and abdominal pain.  Genitourinary: Negative.   Musculoskeletal: Negative for arthralgias, joint swelling and neck pain.  Skin: Negative.  Negative for rash and wound.  Neurological: Negative for dizziness, weakness, light-headedness, numbness and headaches.  Psychiatric/Behavioral: Negative.     Allergies  Sulfa antibiotics; Demerol; Penicillins; and Doxycycline  Home Medications   Current Outpatient  Rx  Name  Route  Sig  Dispense  Refill  . albuterol (PROVENTIL HFA;VENTOLIN HFA) 108 (90 BASE) MCG/ACT inhaler   Inhalation   Inhale 2 puffs into the lungs every 6 (six) hours as needed. Shortness of breath.         . citalopram (CELEXA) 40 MG tablet   Oral   Take 40 mg by mouth daily.         . clonazePAM (KLONOPIN) 0.5 MG tablet   Oral   Take 1 tablet by mouth 2 (two) times daily.         . cyclobenzaprine (FLEXERIL) 10 MG tablet   Oral   Take 10 mg by mouth Every 6 hours as needed for muscle spasms.          . famotidine (PEPCID) 20 MG tablet   Oral   Take 1 tablet (20 mg total) by mouth 2 (two) times  daily.         . Fluticasone-Salmeterol (ADVAIR) 250-50 MCG/DOSE AEPB   Inhalation   Inhale 1 puff into the lungs 2 (two) times daily as needed (shortness of breath).          . furosemide (LASIX) 20 MG tablet   Oral   Take 20 mg by mouth daily.         Marland Kitchen levothyroxine (SYNTHROID, LEVOTHROID) 125 MCG tablet   Oral   Take 125-187.5 mcg by mouth See admin instructions. Take 1 tablet (125 mcg total) on Sun Tue Wed Fri Sat. 1.5 tabs (187.49mcg) on Monday and Thursday         . lisinopril (PRINIVIL,ZESTRIL) 40 MG tablet   Oral   Take 40 mg by mouth daily.         Marland Kitchen oxyCODONE-acetaminophen (PERCOCET) 10-325 MG per tablet   Oral   Take 1 tablet by mouth 4 times daily.         . chlorpheniramine-HYDROcodone (TUSSIONEX PENNKINETIC ER) 10-8 MG/5ML LQCR   Oral   Take 5 mLs by mouth 2 (two) times daily.   75 mL   0   . predniSONE (DELTASONE) 20 MG tablet   Oral   Take 3 tablets (60 mg total) by mouth daily.   12 tablet   0    BP 133/62  Pulse 85  Temp(Src) 98.4 F (36.9 C) (Oral)  Resp 18  Wt 150 lb (68.04 kg)  SpO2 99% Physical Exam  Nursing note and vitals reviewed. Constitutional: She appears well-developed and well-nourished. No distress.  Appears tired   HENT:  Head: Normocephalic and atraumatic.  Right Ear: Tympanic membrane normal.  Left Ear: Tympanic membrane normal.  Nose: Nose normal.  Mouth/Throat: Uvula is midline and mucous membranes are normal. Posterior oropharyngeal erythema present. No oropharyngeal exudate, posterior oropharyngeal edema or tonsillar abscesses.  Mild posterior pharynx erythema. No edema.  Eyes: Conjunctivae are normal.  Neck: Normal range of motion.  Cardiovascular: Normal rate, regular rhythm, normal heart sounds and intact distal pulses.   Pulmonary/Chest: Effort normal. No stridor. No respiratory distress. She has decreased breath sounds. She has wheezes. She has no rhonchi. She has no rales.  Decrease breath sounds  bilateral bases.  Expiratory wheeze with prolonged expirations present.  Abdominal: Soft. Bowel sounds are normal. There is no tenderness.  Musculoskeletal: Normal range of motion.  Neurological: She is alert.  Skin: Skin is warm and dry.  Psychiatric: She has a normal mood and affect.    ED Course  Procedures (including critical care time) Labs  Review Labs Reviewed - No data to display Imaging Review Dg Neck Soft Tissue  10/20/2013   CLINICAL DATA:  Cough.  Short throat.  EXAM: NECK SOFT TISSUES - 1+ VIEW  COMPARISON:  Visualized soft tissues on prior cervical spine CT 05/16/2013 and cervical spine MRI 05/23/2013.  FINDINGS: Prevertebral soft tissues normal. Pharyngeal and upper tracheal airway within normal limits. Normal upper glottis. Degenerative changes involving the cervical spine as noted previously.  IMPRESSION: Normal lateral soft tissue neck examination.   Electronically Signed   By: Hulan Saas M.D.   On: 10/20/2013 13:23   Dg Chest 2 View  10/20/2013   CLINICAL DATA:  Cough.  Short throat.  Current history of COPD.  EXAM: CHEST  2 VIEW  COMPARISON:  CTA chest and portable chest x-ray 08/24/2013. Two-view chest x-ray 12/28/2012, 08/06/2012.  FINDINGS: Cardiomediastinal silhouette unremarkable and unchanged. Mild emphysematous changes suspected in the upper lobes. Lungs clear. Bronchovascular markings normal. Pulmonary vascularity normal. No visible pleural effusions. No pneumothorax. Visualized bony thorax intact. No significant interval change.  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 No acute cardiopulmonary disease. Stable examination.   Electronically Signed   By: Hulan Saas M.D.   On: 10/20/2013 13:21    EKG Interpretation   None       MDM   1. Pharyngitis   2. Bronchitis    Pt was given albuterol/atrovent neb x 1 with significant improvement in bilateral aeration and wheezing.  Given first dose of a 5 day pulse dose of prednisone, tussionex for cough and  sore throat pain.  Encouraged rest,  Continued home albuterol prn wheezing.  Increased fluid intake.  F/u with pcp this week for recheck,  Immediate recheck for any worsened sx.  The patient appears reasonably screened and/or stabilized for discharge and I doubt any other medical condition or other [facility] requiring further screening, evaluation, or treatment in the ED at this time prior to discharge.  Patients labs and/or radiological studies were viewed and considered during the medical decision making and disposition process.     Burgess Amor, PA-C 10/21/13 [phone]

## 2013-10-21 NOTE — ED Provider Notes (Signed)
Medical screening examination/treatment/procedure(s) were performed by non-physician practitioner and as supervising physician I was immediately available for consultation/collaboration.  EKG Interpretation   None         Shanna Cisco, MD 10/21/13 1209

## 2013-10-29 ENCOUNTER — Other Ambulatory Visit (HOSPITAL_COMMUNITY): Payer: Self-pay | Admitting: Specialist

## 2013-11-04 ENCOUNTER — Encounter (HOSPITAL_COMMUNITY): Payer: Self-pay | Admitting: *Deleted

## 2013-11-04 MED ORDER — VANCOMYCIN HCL IN DEXTROSE 1-5 GM/200ML-% IV SOLN: 1000.0000 mg | INTRAVENOUS | Status: AC

## 2013-11-05 NOTE — Progress Notes (Signed)
Spoke with pt., to advise her to arrive at 1045 the a.m. Of her surgery, 11/12/2013

## 2013-11-08 ENCOUNTER — Encounter (HOSPITAL_COMMUNITY): Payer: Self-pay | Admitting: Pharmacy Technician

## 2013-11-11 NOTE — H&P (Signed)
[name] is an 53 y.o. female.   Chief Complaint: neck and left arm pain, numbness HPI: Experiencing pain in her neck and radiation of the left upper extremity greater than right.  She has pain with turning her head to the side.  She had epidural steroids and has been taking medication to try and relieve the discomfort.  She is on very strong narcotic medicine, Percocet 10 mg tablets 2 every 4 hours.  No bowel or bladder symptoms. no balance difficulties.    The EMGs and nerve conduction studies in the past have been unremarkable for upper extremity findings of pain radiating down the left arm.  She has had a history of right carpal tunnel syndrome and right carpal tunnel release in the past.  Selective nerve blocks were unable to be done due to severe pain.  Her MRI study in the past has demonstrated degenerative joint changes at both C4-5 and C5-6 with some impression on the ventral thecal sac, ventral aspect of the spinal cord and left C4-5 and at C5-6 due to uncovertebral change and hypertrophy of the uncovertebral joint.  A small amount of soft tissue protrusion also noted at the C4-5 level.     The results of myelogram and postmyelogram CT scan of the cervical and lumbar spine are reviewed.  The results of the cervical myelogram demonstrates that she has anterior defect at the C4-5 level and slightly diminished filling of her nerve root sleeve at C5 bilaterally.  Other sleeves seem to fill normally throughout the cervical spine.  A bulging of a disk noted at C5-6 with no significant foraminal or canal narrowing and also a minimal bulge at C3-4 with no canal or foraminal narrowing.  The foraminal narrowing noted to be significant at the C4-5 level due to spondylosis, endplate osteophytes and bulging of the disk with osteophytic encroachment on both neural foramen.  The patient also has some very mild osteoarthritis changes at C1-2 expected for age.    ASSESSMENT: The studies explain the problems  that she has been having with her neck and pain radiating into her shoulders and arms, the left arm greater than the right.  Considering the findings of her myelogram and postmyelogram CT scan I think that it is apparent that the condition in her neck is related to C4-5 and she has a foraminal entrapment associated with this causing C5 nerve compression and radiation of the left arm greater than the right.   PLAN:  The risks of surgery in regard to the cervical spine were discussed including risk of infection, bleeding, neurologic compromise. Recommend that she undergo anterior cervical diskectomy and fusion with bilateral foraminotomies at the C4-5 level.  A fusion with a combination of local bone graft and allograft bone graft and then plate and screw fixation.  The risks of surgery include the risk of infection, bleeding and neurologic compromise.  We will go ahead and schedule her for intervention at this point and she wishes to proceed.     Past Medical History  Diagnosis Date  . GERD (gastroesophageal reflux disease)   . Gastritis   . Fibromyalgia   . HTN (hypertension)   . Edema of lower extremity   . Kidney stone   . Migraine   . DDD (degenerative disc disease)   . MS (multiple sclerosis)   . Diabetes mellitus   . Neuropathy   . Asthma   . CHF (congestive heart failure)   . COPD (chronic obstructive pulmonary disease)   .  Hiatal hernia   . Stroke   . Migraine   . Hypothyroidism   . Gout   . Complication of anesthesia     "hard time waking up"  . PONV (postoperative nausea and vomiting)   . Family history of anesthesia complication     Daughter had PNV  . Pneumonia   . Sleep apnea   . TIA (transient ischemic attack)     Hx: of    Past Surgical History  Procedure Laterality Date  . Fundic gland polyp      benign  . Sigmoidoscopy  01/31/08    large internal hemorrhoids/small rectal polyp removed/rare sigmoid diverticula  . Esophagogastroduodenoscopy  10/29/07    normal   . Cholecystectomy    . Bladder suspension    . Abdominal hysterectomy    . Carpal tunnel release      right  . Cardiac catheterization      Family History  Problem Relation Age of Onset  . Diabetes Mother   . Hypertension Mother   . Arthritis Father   . Diabetes Sister   . Neuropathy Sister   . Transient ischemic attack Sister   . Heart disease Sister   . Hypertension Sister   . Cancer - Lung Other   . Cancer - Prostate Other   . Cancer - Other Other    Social History:  reports that she has been smoking Cigarettes.  She has been smoking about 0.50 packs per day. She has never used smokeless tobacco. She reports that she does not drink alcohol or use illicit drugs.  Allergies:  Allergies  Allergen Reactions  . Sulfa Antibiotics Anaphylaxis  . Demerol [Meperidine] Swelling  . Penicillins Swelling  . Neurontin [Gabapentin] Other (See Comments)    Abdominal pain  . Doxycycline Nausea And Vomiting         No prescriptions prior to admission    No results found for this or any previous visit (from the past 48 hour(s)). No results found.  Review of Systems  Musculoskeletal: Positive for back pain and neck pain.  All other systems reviewed and are negative.    Height 5\' 4"  (1.626 m), weight 63.957 kg (141 lb). Physical Exam  Constitutional: She is oriented to person, place, and time. She appears well-developed and well-nourished.  HENT:  Head: Normocephalic and atraumatic.  Eyes: Pupils are equal, round, and reactive to light.  Cardiovascular: Normal rate.   Respiratory: Effort normal.  GI: Soft.  Musculoskeletal:  She has limitation and extension of the neck.  Lateral bending rotation on the left.  She is uncomfortable.  She has positive Spurling sign here.  Weak shoulder abduction on the left greater than right.  She has weakness with left wrist volar flexion on the left, finger extension on the left, 5-/5 each.  Reflexes at the biceps 2+ and symmetric, triceps  2+ and symmetric.  Brachioradialis 2+ and symmetric.  Hoffman sign is negative.  Reflexes at the knee 2-1/2+ and symmetric, the ankle 2+ and symmetric.    Neurological: She is alert and oriented to person, place, and time.  Skin: Skin is warm and dry.  Psychiatric: She has a normal mood and affect.     Assessment/Plan C4-5 bilateral spondylosis and left C5 radiculopathy  PLAN:  ACDF C4-5 with allograft, plate and screws.  Jacelyn Cuen M 11/11/2013, 9:09 AM

## 2013-11-12 ENCOUNTER — Ambulatory Visit (HOSPITAL_COMMUNITY): Admission: RE | Admit: 2013-11-12 | Payer: 59 | Source: Ambulatory Visit | Admitting: Specialist

## 2013-11-12 HISTORY — DX: Transient cerebral ischemic attack, unspecified: G45.9

## 2013-11-12 HISTORY — DX: Other specified postprocedural states: Z98.890

## 2013-11-12 HISTORY — DX: Nausea with vomiting, unspecified: R11.2

## 2013-11-12 HISTORY — DX: Pneumonia, unspecified organism: J18.9

## 2013-11-12 HISTORY — DX: Sleep apnea, unspecified: G47.30

## 2013-11-12 SURGERY — ANTERIOR CERVICAL DECOMPRESSION/DISCECTOMY FUSION 1 LEVEL
Anesthesia: General

## 2013-11-12 NOTE — H&P (Signed)
Patient examined and lab reviewed with Vernon PA-C. 

## 2013-12-17 ENCOUNTER — Other Ambulatory Visit (HOSPITAL_COMMUNITY): Payer: Self-pay | Admitting: Internal Medicine

## 2013-12-17 DIAGNOSIS — Z1231 Encounter for screening mammogram for malignant neoplasm of breast: Secondary | ICD-10-CM

## 2013-12-19 ENCOUNTER — Inpatient Hospital Stay (HOSPITAL_COMMUNITY): Admission: RE | Admit: 2013-12-19 | Payer: 59 | Source: Ambulatory Visit

## 2013-12-19 ENCOUNTER — Ambulatory Visit (HOSPITAL_COMMUNITY): Payer: 59

## 2013-12-20 ENCOUNTER — Encounter (HOSPITAL_COMMUNITY): Payer: Self-pay | Admitting: Emergency Medicine

## 2013-12-20 ENCOUNTER — Emergency Department (HOSPITAL_COMMUNITY)
Admission: EM | Admit: 2013-12-20 | Discharge: 2013-12-20 | Disposition: A | Discharge status: Home / Self Care | Payer: 59 | Attending: Emergency Medicine | Admitting: Emergency Medicine

## 2013-12-20 ENCOUNTER — Other Ambulatory Visit (HOSPITAL_COMMUNITY): Payer: Self-pay

## 2013-12-20 DIAGNOSIS — Z7982 Long term (current) use of aspirin: Secondary | ICD-10-CM | POA: Insufficient documentation

## 2013-12-20 DIAGNOSIS — G43909 Migraine, unspecified, not intractable, without status migrainosus: Secondary | ICD-10-CM | POA: Insufficient documentation

## 2013-12-20 DIAGNOSIS — J4489 Other specified chronic obstructive pulmonary disease: Secondary | ICD-10-CM | POA: Insufficient documentation

## 2013-12-20 DIAGNOSIS — Z9889 Other specified postprocedural states: Secondary | ICD-10-CM | POA: Insufficient documentation

## 2013-12-20 DIAGNOSIS — I509 Heart failure, unspecified: Secondary | ICD-10-CM | POA: Insufficient documentation

## 2013-12-20 DIAGNOSIS — H9209 Otalgia, unspecified ear: Secondary | ICD-10-CM | POA: Insufficient documentation

## 2013-12-20 DIAGNOSIS — E039 Hypothyroidism, unspecified: Secondary | ICD-10-CM | POA: Insufficient documentation

## 2013-12-20 DIAGNOSIS — K219 Gastro-esophageal reflux disease without esophagitis: Secondary | ICD-10-CM | POA: Insufficient documentation

## 2013-12-20 DIAGNOSIS — I10 Essential (primary) hypertension: Secondary | ICD-10-CM | POA: Insufficient documentation

## 2013-12-20 DIAGNOSIS — Z9089 Acquired absence of other organs: Secondary | ICD-10-CM | POA: Insufficient documentation

## 2013-12-20 DIAGNOSIS — M79609 Pain in unspecified limb: Secondary | ICD-10-CM | POA: Insufficient documentation

## 2013-12-20 DIAGNOSIS — Z8701 Personal history of pneumonia (recurrent): Secondary | ICD-10-CM | POA: Insufficient documentation

## 2013-12-20 DIAGNOSIS — M549 Dorsalgia, unspecified: Secondary | ICD-10-CM | POA: Insufficient documentation

## 2013-12-20 DIAGNOSIS — F17200 Nicotine dependence, unspecified, uncomplicated: Secondary | ICD-10-CM | POA: Insufficient documentation

## 2013-12-20 DIAGNOSIS — R51 Headache: Secondary | ICD-10-CM | POA: Insufficient documentation

## 2013-12-20 DIAGNOSIS — Z8673 Personal history of transient ischemic attack (TIA), and cerebral infarction without residual deficits: Secondary | ICD-10-CM | POA: Insufficient documentation

## 2013-12-20 DIAGNOSIS — Z87442 Personal history of urinary calculi: Secondary | ICD-10-CM | POA: Insufficient documentation

## 2013-12-20 DIAGNOSIS — M542 Cervicalgia: Secondary | ICD-10-CM | POA: Insufficient documentation

## 2013-12-20 DIAGNOSIS — Z79899 Other long term (current) drug therapy: Secondary | ICD-10-CM | POA: Insufficient documentation

## 2013-12-20 DIAGNOSIS — Z880 Allergy status to penicillin: Secondary | ICD-10-CM | POA: Insufficient documentation

## 2013-12-20 DIAGNOSIS — J449 Chronic obstructive pulmonary disease, unspecified: Secondary | ICD-10-CM | POA: Insufficient documentation

## 2013-12-20 DIAGNOSIS — E119 Type 2 diabetes mellitus without complications: Secondary | ICD-10-CM | POA: Insufficient documentation

## 2013-12-20 LAB — GLUCOSE, CAPILLARY: Glucose-Capillary: 112 mg/dL — ABNORMAL HIGH (ref 70–99)

## 2013-12-20 MED ORDER — HYDROMORPHONE HCL PF 1 MG/ML IJ SOLN
1.0000 mg | Freq: Once | INTRAMUSCULAR | Status: AC
  Administered 2013-12-20: 1 mg via INTRAMUSCULAR
  Filled 2013-12-20: qty 1

## 2013-12-20 MED ORDER — DICLOFENAC SODIUM 75 MG PO TBEC: 75.0000 mg | DELAYED_RELEASE_TABLET | Freq: Two times a day (BID) | ORAL | Status: DC

## 2013-12-20 MED ORDER — KETOROLAC TROMETHAMINE 60 MG/2ML IM SOLN
60.0000 mg | Freq: Once | INTRAMUSCULAR | Status: AC
  Administered 2013-12-20: 60 mg via INTRAMUSCULAR
  Filled 2013-12-20: qty 2

## 2013-12-20 MED ORDER — METHOCARBAMOL 500 MG PO TABS: 500.0000 mg | ORAL_TABLET | Freq: Three times a day (TID) | ORAL | Status: DC

## 2013-12-20 MED ORDER — PROMETHAZINE HCL 12.5 MG PO TABS
12.5000 mg | ORAL_TABLET | Freq: Once | ORAL | Status: AC
  Administered 2013-12-20: 12.5 mg via ORAL
  Filled 2013-12-20: qty 1

## 2013-12-20 NOTE — ED Provider Notes (Signed)
[name]: [id number]     Arrival date & time 12/20/13  1425 History   First MD Initiated Contact with Patient 12/20/13 1433     Chief Complaint  Patient presents with  . Neck Pain     (Consider location/radiation/quality/duration/timing/severity/associated sxs/prior Treatment) HPI Comments: Patient is a 53 year old female who presents to the emergency department with complaint of right-sided neck pain. The patient complains of a pain on the right and left side of the neck, right more than left. She states this is been going on for 2 weeks or more. She reports cyst of the posterior neck on both sides of, that were larger than they are today according to the patient. They have gone down, but she states that it feels as though the cyst of bone into the muscle, and now she has pain on the right side of her neck going into her head. She also reports that her legs hurt in her right ear hurts. She has not had shortness of breath or chest pain with this. She's not had unusual sweats and no loss of consciousness. She's not had any injury to the area. The patient is scheduled to have cervical disc surgery on February 23. The patient states that she does not believe the disc related problem has anything to do with this pain, because she thinks that it's related to the cyst that she was feeling on her neck. Patient states that the pain medicines and nothing else seems to really help this problem.  Patient is a 53 y.o. female presenting with neck pain. The history is provided by the patient.  Neck Pain Associated symptoms: headaches   Associated symptoms: no chest pain and no photophobia     Past Medical History  Diagnosis Date  . GERD (gastroesophageal reflux disease)   . Gastritis   . Fibromyalgia   . HTN (hypertension)   . Edema of lower extremity   . Kidney stone   . Migraine   . DDD (degenerative disc disease)   . MS (multiple sclerosis)   . Diabetes mellitus   . Neuropathy   . Asthma   . CHF  (congestive heart failure)   . COPD (chronic obstructive pulmonary disease)   . Hiatal hernia   . Stroke   . Migraine   . Hypothyroidism   . Gout   . Complication of anesthesia     "hard time waking up"  . PONV (postoperative nausea and vomiting)   . Family history of anesthesia complication     Daughter had PNV  . Pneumonia   . Sleep apnea   . TIA (transient ischemic attack)     Hx: of   Past Surgical History  Procedure Laterality Date  . Fundic gland polyp      benign  . Sigmoidoscopy  01/31/08    large internal hemorrhoids/small rectal polyp removed/rare sigmoid diverticula  . Esophagogastroduodenoscopy  10/29/07    normal  . Cholecystectomy    . Bladder suspension    . Abdominal hysterectomy    . Carpal tunnel release      right  . Cardiac catheterization     Family History  Problem Relation Age of Onset  . Diabetes Mother   . Hypertension Mother   . Arthritis Father   . Diabetes Sister   . Neuropathy Sister   . Transient ischemic attack Sister   . Heart disease Sister   . Hypertension Sister   . Cancer - Lung Other   . Cancer - Prostate  Other   . Cancer - Other Other    History  Substance Use Topics  . Smoking status: Current Some Day Smoker -- 0.50 packs/day    Types: Cigarettes  . Smokeless tobacco: Never Used  . Alcohol Use: No   OB History   Grav Para Term Preterm Abortions TAB SAB Ect Mult Living                 Review of Systems  Constitutional: Negative for activity change.       All ROS Neg except as noted in HPI  HENT: Negative for nosebleeds.   Eyes: Negative for photophobia and discharge.  Respiratory: Negative for cough, shortness of breath and wheezing.   Cardiovascular: Negative for chest pain and palpitations.  Gastrointestinal: Negative for abdominal pain and blood in stool.  Genitourinary: Negative for dysuria, frequency and hematuria.  Musculoskeletal: Positive for back pain and neck pain. Negative for arthralgias.  Skin:  Negative.   Neurological: Positive for headaches. Negative for dizziness, seizures and speech difficulty.  Psychiatric/Behavioral: Negative for hallucinations and confusion.      Allergies  Sulfa antibiotics; Demerol; Penicillins; Neurontin; and Doxycycline  Home Medications   Current Outpatient Rx  Name  Route  Sig  Dispense  Refill  . albuterol (PROVENTIL HFA;VENTOLIN HFA) 108 (90 BASE) MCG/ACT inhaler   Inhalation   Inhale 2 puffs into the lungs every 6 (six) hours as needed. Shortness of breath.         Marland Kitchen aspirin EC 81 MG tablet   Oral   Take 81 mg by mouth every other day.         . chlorpheniramine-HYDROcodone (TUSSIONEX PENNKINETIC ER) 10-8 MG/5ML LQCR   Oral   Take 5 mLs by mouth 2 (two) times daily as needed for cough.         . citalopram (CELEXA) 40 MG tablet   Oral   Take 40 mg by mouth daily.         . clonazePAM (KLONOPIN) 0.5 MG tablet   Oral   Take 0.5 mg by mouth 2 (two) times daily.          . cyclobenzaprine (FLEXERIL) 10 MG tablet   Oral   Take 10 mg by mouth Every 6 hours as needed for muscle spasms.          . famotidine (PEPCID) 20 MG tablet   Oral   Take 1 tablet (20 mg total) by mouth 2 (two) times daily.         . Fluticasone-Salmeterol (ADVAIR) 250-50 MCG/DOSE AEPB   Inhalation   Inhale 1 puff into the lungs 2 (two) times daily as needed (shortness of breath).          . furosemide (LASIX) 20 MG tablet   Oral   Take 20 mg by mouth daily.         Marland Kitchen levothyroxine (SYNTHROID, LEVOTHROID) 125 MCG tablet   Oral   Take 125-187.5 mcg by mouth See admin instructions. Take 1 tablet (125 mcg total) on Sun Tue Wed Fri Sat. 1.5 tabs (187.61mcg) on Monday and Thursday         . lisinopril (PRINIVIL,ZESTRIL) 40 MG tablet   Oral   Take 40 mg by mouth daily.         Marland Kitchen oxyCODONE-acetaminophen (PERCOCET) 10-325 MG per tablet   Oral   Take 1 tablet by mouth every 3 (three) hours.           BP 114/89  Pulse 85  Temp(Src)  99.5 F (37.5 C) (Rectal)  Resp 20  Ht 5\' 5"  (1.651 m)  Wt 138 lb (62.596 kg)  BMI 22.96 kg/m2  SpO2 99% Physical Exam  Nursing note and vitals reviewed. Constitutional: She is oriented to person, place, and time. She appears well-developed and well-nourished.  Non-toxic appearance.  HENT:  Head: Normocephalic.  Right Ear: Tympanic membrane and external ear normal.  Left Ear: Tympanic membrane and external ear normal.  Eyes: EOM and lids are normal. Pupils are equal, round, and reactive to light.  Neck: Normal range of motion. Neck supple. Carotid bruit is not present.  Cardiovascular: Normal rate, regular rhythm, normal heart sounds, intact distal pulses and normal pulses.   Pulmonary/Chest: Breath sounds normal. No respiratory distress.  Abdominal: Soft. Bowel sounds are normal. There is no tenderness. There is no guarding.  Musculoskeletal: Normal range of motion.  Lymphadenopathy:       Head (right side): No submandibular adenopathy present.       Head (left side): No submandibular adenopathy present.    She has no cervical adenopathy.  Neurological: She is alert and oriented to person, place, and time. She has normal strength. No cranial nerve deficit or sensory deficit.  Skin: Skin is warm and dry.  Psychiatric: She has a normal mood and affect. Her speech is normal.    ED Course  Procedures (including critical care time) Labs Review Labs Reviewed  GLUCOSE, CAPILLARY - Abnormal; Notable for the following:    Glucose-Capillary 112 (*)    All other components within normal limits   Imaging Review No results found.  EKG Interpretation   None       MDM   Final diagnoses:  None    *I have reviewed nursing notes, vital signs, and all appropriate lab and imaging results for this patient.**  Patient seen with me by Dr. Jeraldine Loots. Labs were canceled. Patient feeling better after intramuscular Toradol and Dilaudid. Patient is ambulatory. Patient advised to add  diclofenac and Robaxin to her current medications. Prescriptions given. Patient will followup with her primary physician or her orthopedic specialist next week. Patient invited to return if any emergent changes or problems.  Kathie Dike, PA-C 12/23/13 1423

## 2013-12-20 NOTE — ED Notes (Signed)
Pain rt side of neck , up into head for 2 weeks, legs hurt.  Jaws , hurt and rt ear.

## 2013-12-20 NOTE — ED Notes (Signed)
Pt is scheduled for cervical surgery in 2 weeks by Dr Otelia Sergeant.  Pt has "knot" rt post lat neck for 2 weeks and getting worse.  Says her legs cramp at times and back hurts. "I think its my kidneys".

## 2013-12-20 NOTE — Discharge Instructions (Signed)
Your pain seems to be related to an inflammatory process. Please add the 2 new medicines diclofenac and Robaxin to your current medications. Please see your primary physician or your orthopedic specialist for recheck and evaluation next week. Please use Tylenol every 4 hours for fever if needed. Please see your primary physician or return to the emergency department if any emergent changes or problems. Musculoskeletal Pain Musculoskeletal pain is muscle and boney aches and pains. These pains can occur in any part of the body. Your caregiver may treat you without knowing the cause of the pain. They may treat you if blood or urine tests, X-rays, and other tests were normal.  CAUSES There is often not a definite cause or reason for these pains. These pains may be caused by a type of germ (virus). The discomfort may also come from overuse. Overuse includes working out too hard when your body is not fit. Boney aches also come from weather changes. Bone is sensitive to atmospheric pressure changes. HOME CARE INSTRUCTIONS   Ask when your test results will be ready. Make sure you get your test results.  Only take over-the-counter or prescription medicines for pain, discomfort, or fever as directed by your caregiver. If you were given medications for your condition, do not drive, operate machinery or power tools, or sign legal documents for 24 hours. Do not drink alcohol. Do not take sleeping pills or other medications that may interfere with treatment.  Continue all activities unless the activities cause more pain. When the pain lessens, slowly resume normal activities. Gradually increase the intensity and duration of the activities or exercise.  During periods of severe pain, bed rest may be helpful. Lay or sit in any position that is comfortable.  Putting ice on the injured area.  Put ice in a bag.  Place a towel between your skin and the bag.  Leave the ice on for 15 to 20 minutes, 3 to 4 times a  day.  Follow up with your caregiver for continued problems and no reason can be found for the pain. If the pain becomes worse or does not go away, it may be necessary to repeat tests or do additional testing. Your caregiver may need to look further for a possible cause. SEEK IMMEDIATE MEDICAL CARE IF:  You have pain that is getting worse and is not relieved by medications.  You develop chest pain that is associated with shortness or breath, sweating, feeling sick to your stomach (nauseous), or throw up (vomit).  Your pain becomes localized to the abdomen.  You develop any new symptoms that seem different or that concern you. MAKE SURE YOU:   Understand these instructions.  Will watch your condition.  Will get help right away if you are not doing well or get worse. Document Released: 10/24/2005 Document Revised: 01/16/2012 Document Reviewed: 06/28/2013 [facility] Patient Information 2014 [place], [place].

## 2013-12-24 NOTE — Pre-Procedure Instructions (Signed)
[name]  12/24/2013   Your procedure is scheduled on:  Mon, Feb 23 @ 7:30 AM  Report to [facility] Stay Entrance A  at 5:30 AM.  Call this number if you have problems the morning of surgery: [phone]   Remember:   Do not eat food or drink liquids after midnight.   Take these medicines the morning of surgery with A SIP OF WATER: Albuterol<Bring Your Inhaler With You> Celexa(Citalopram),Clonazepam(Klonopin),Pepcid(Famotidine),Advair(Fluticasone-Salmeterol),Synthroid(Levothyroxine),and Pain Pill(if needed).               Stop taking your Diclofenac and Aspirin. No Goody's,BC's,Aleve,Ibuprofen,Fish Oil,or any Herbal Medications   Do not wear jewelry, make-up or nail polish.  Do not wear lotions, powders, or perfumes. You may wear deodorant.  Do not shave 48 hours prior to surgery.   Do not bring valuables to the hospital.  [facility] is not responsible                  for any belongings or valuables.               Contacts, dentures or bridgework may not be worn into surgery.  Leave suitcase in the car. After surgery it may be brought to your room.  For patients admitted to the hospital, discharge time is determined by your                treatment team.               Patients discharged the day of surgery will not be allowed to drive  home.    Special Instructions:  [facility] - Preparing for Surgery  Before surgery, you can play an important role.  Because skin is not sterile, your skin needs to be as free of germs as possible.  You can reduce the number of germs on you skin by washing with CHG (chlorahexidine gluconate) soap before surgery.  CHG is an antiseptic cleaner which kills germs and bonds with the skin to continue killing germs even after washing.  Please DO NOT use if you have an allergy to CHG or antibacterial soaps.  If your skin becomes reddened/irritated stop using the CHG and inform your nurse when you arrive at Short Stay.  Do not shave (including  legs and underarms) for at least 48 hours prior to the first CHG shower.  You may shave your face.  Please follow these instructions carefully:   1.  Shower with CHG Soap the night before surgery and the                                morning of Surgery.  2.  If you choose to wash your hair, wash your hair first as usual with your       normal shampoo.  3.  After you shampoo, rinse your hair and body thoroughly to remove the                      Shampoo.  4.  Use CHG as you would any other liquid soap.  You can apply chg directly       to the skin and wash gently with scrungie or a clean washcloth.  5.  Apply the CHG Soap to your body ONLY FROM THE NECK DOWN.        Do not use on open wounds or open sores.  Avoid contact  with your eyes,       ears, mouth and genitals (private parts).  Wash genitals (private parts)       with your normal soap.  6.  Wash thoroughly, paying special attention to the area where your surgery        will be performed.  7.  Thoroughly rinse your body with warm water from the neck down.  8.  DO NOT shower/wash with your normal soap after using and rinsing off       the CHG Soap.  9.  Pat yourself dry with a clean towel.            10.  Wear clean pajamas.            11.  Place clean sheets on your bed the night of your first shower and do not        sleep with pets.  Day of Surgery  Do not apply any lotions/deoderants the morning of surgery.  Please wear clean clothes to the hospital/surgery center.     Please read over the following fact sheets that you were given: Pain Booklet, Coughing and Deep Breathing, MRSA Information and Surgical Site Infection Prevention

## 2013-12-25 ENCOUNTER — Other Ambulatory Visit (HOSPITAL_COMMUNITY): Payer: Self-pay | Admitting: Specialist

## 2013-12-25 ENCOUNTER — Encounter (HOSPITAL_COMMUNITY)
Admission: RE | Admit: 2013-12-25 | Discharge: 2013-12-25 | Disposition: A | Discharge status: Home / Self Care | Payer: 59 | Source: Ambulatory Visit | Attending: Specialist | Admitting: Specialist

## 2013-12-25 ENCOUNTER — Encounter (HOSPITAL_COMMUNITY): Payer: Self-pay

## 2013-12-25 DIAGNOSIS — Z01812 Encounter for preprocedural laboratory examination: Secondary | ICD-10-CM | POA: Insufficient documentation

## 2013-12-25 HISTORY — DX: Shortness of breath: R06.02

## 2013-12-25 HISTORY — DX: Other muscle spasm: M62.838

## 2013-12-25 HISTORY — DX: Depression, unspecified: F32.A

## 2013-12-25 HISTORY — DX: Personal history of colon polyps, unspecified: Z86.0100

## 2013-12-25 HISTORY — DX: Personal history of colonic polyps: Z86.010

## 2013-12-25 HISTORY — DX: Frequency of micturition: R35.0

## 2013-12-25 HISTORY — DX: Hyperlipidemia, unspecified: E78.5

## 2013-12-25 HISTORY — DX: Pain in unspecified joint: M25.50

## 2013-12-25 HISTORY — DX: Phlebitis and thrombophlebitis of unspecified site: I80.9

## 2013-12-25 HISTORY — DX: Age-related osteoporosis without current pathological fracture: M81.0

## 2013-12-25 HISTORY — DX: Personal history of urinary calculi: Z87.442

## 2013-12-25 HISTORY — DX: Emphysema, unspecified: J43.9

## 2013-12-25 HISTORY — DX: Other chronic pain: G89.29

## 2013-12-25 HISTORY — DX: Unspecified hemorrhoids: K64.9

## 2013-12-25 HISTORY — DX: Insomnia, unspecified: G47.00

## 2013-12-25 HISTORY — DX: Dorsalgia, unspecified: M54.9

## 2013-12-25 HISTORY — DX: Major depressive disorder, single episode, unspecified: F32.9

## 2013-12-25 HISTORY — DX: Effusion, unspecified joint: M25.40

## 2013-12-25 HISTORY — DX: Personal history of other diseases of the respiratory system: Z87.09

## 2013-12-25 LAB — CBC
HCT: 40.7 % (ref 36.0–46.0)
HEMOGLOBIN: 14.1 g/dL (ref 12.0–15.0)
MCH: 30.6 pg (ref 26.0–34.0)
MCHC: 34.6 g/dL (ref 30.0–36.0)
MCV: 88.3 fL (ref 78.0–100.0)
Platelets: 245 10*3/uL (ref 150–400)
RBC: 4.61 MIL/uL (ref 3.87–5.11)
RDW: 13.4 % (ref 11.5–15.5)
WBC: 7.2 10*3/uL (ref 4.0–10.5)

## 2013-12-25 LAB — COMPREHENSIVE METABOLIC PANEL
ALK PHOS: 78 U/L (ref 39–117)
ALT: 22 U/L (ref 0–35)
AST: 16 U/L (ref 0–37)
Albumin: 4 g/dL (ref 3.5–5.2)
BILIRUBIN TOTAL: 0.3 mg/dL (ref 0.3–1.2)
BUN: 9 mg/dL (ref 6–23)
CO2: 30 meq/L (ref 19–32)
Calcium: 9.8 mg/dL (ref 8.4–10.5)
Chloride: 102 mEq/L (ref 96–112)
Creatinine, Ser: 0.73 mg/dL (ref 0.50–1.10)
GFR calc Af Amer: >90 mL/min (ref >90)
GLUCOSE: 100 mg/dL — AB (ref 70–99)
POTASSIUM: 3.7 meq/L (ref 3.7–5.3)
SODIUM: 145 meq/L (ref 137–147)
Total Protein: 7.5 g/dL (ref 6.0–8.3)

## 2013-12-25 LAB — SURGICAL PCR SCREEN
MRSA, PCR: NEGATIVE
STAPHYLOCOCCUS AUREUS: NEGATIVE

## 2013-12-25 MED ORDER — CHLORHEXIDINE GLUCONATE 4 % EX LIQD: 60.0000 mL | Freq: Once | CUTANEOUS | Status: DC

## 2013-12-25 NOTE — Progress Notes (Signed)
Pt saw a cardiologist Dr.Rothbart 9yrs ago and was told just to come back as needed  Echo/Stress test done 7-19yrs ago  Heart cath done at least 15+yrs ago  Medical Md is Dr.Zach Beaman  EKG in epic from 08-24-13  CXR in epic from 10-18-13

## 2013-12-25 NOTE — ED Provider Notes (Signed)
Medical screening examination/treatment/procedure(s) were performed by non-physician practitioner and as supervising physician I was immediately available for consultation/collaboration.  Ameila Weldon, MD 12/25/13 0710 

## 2013-12-27 NOTE — H&P (Signed)
[name] is an 53 y.o. female.   Chief Complaint: neck pain and left > right arm pain HPI: Pt with severe and progressive pain in the neck and upper extremities.  Left >right.  She has failed conservative treatment of select nerve blocks and now requires daily narcotic analgesics for pain control. MRI study has shown degenerative changes at the C3-4, C4-5 ,C5-6 and C7-T1.  The most prominent changes with uncovertebral findings are at C4-5 and C5-6 with impression of the left side of the thecal sac at both of these segments.  CT/myelogram with extradural defect at C4-5 and diminished filling of the C5 nerve roots bilaterally. Pt wishes to proceed with anterior cervical discectomy and fusion of the C4-5 and C5-6 levels with allograft, plate and screws.  Past Medical History  Diagnosis Date  . Gastritis   . Fibromyalgia   . Edema of lower extremity   . DDD (degenerative disc disease)   . MS (multiple sclerosis)     early stages  . Neuropathy   . COPD (chronic obstructive pulmonary disease)   . Hiatal hernia   . Gout   . TIA (transient ischemic attack)     Hx: of  . Asthma     Albuterol prn   . Depression     takes Celexa,Clonazepam daily  . Muscle spasm     takes Flexeril daily as needed  . GERD (gastroesophageal reflux disease)     takes Pepcid daily  . CHF (congestive heart failure)     takes Furosemide daily  . Hypothyroidism     takes Synthroid daily  . HTN (hypertension)     takes lisinopril daily  . Complication of anesthesia     hard to wake up  . PONV (postoperative nausea and vomiting)   . Sleep apnea     sleep study done at least 10539yrs ago;has CPAP but doesn't use  . Hyperlipidemia     but doesn't take any meds  . Phlebitis   . Emphysema lung   . Shortness of breath     with exertion or stressed   . Pneumonia     hx of;last time in 2014  . History of bronchitis     last time 4-655months ago   . Migraine     has one now  . Migraine   . Stroke     left  sided weakness  . Joint pain   . Joint swelling   . Chronic back pain     DDD/scoliosis  . Osteoporosis   . Hemorrhoids   . History of colon polyps   . Urinary frequency   . History of kidney stones     is in her left kidney and has been for couple of yrs and no problems  . Diabetes mellitus     borderline  . Insomnia     doesn't take any meds for this    Past Surgical History  Procedure Laterality Date  . Fundic gland polyp      benign  . Sigmoidoscopy  01/31/08    large internal hemorrhoids/small rectal polyp removed/rare sigmoid diverticula  . Esophagogastroduodenoscopy  10/29/07    normal  . Cholecystectomy    . Bladder suspension    . Abdominal hysterectomy    . Carpal tunnel release Right   . Cardiac catheterization    . Colonoscopy      Family History  Problem Relation Age of Onset  . Diabetes Mother   . Hypertension Mother   .  Arthritis Father   . Diabetes Sister   . Neuropathy Sister   . Transient ischemic attack Sister   . Heart disease Sister   . Hypertension Sister   . Cancer - Lung Other   . Cancer - Prostate Other   . Cancer - Other Other    Social History:  reports that she has been smoking Cigarettes.  She has a 7.5 pack-year smoking history. She has never used smokeless tobacco. She reports that she does not drink alcohol or use illicit drugs.  Allergies:  Allergies  Allergen Reactions  . Sulfa Antibiotics Anaphylaxis  . Demerol [Meperidine] Swelling  . Penicillins Swelling  . Neurontin [Gabapentin] Other (See Comments)    Abdominal pain  . Doxycycline Nausea And Vomiting         Medications Prior to Admission  Medication Sig Dispense Refill  . albuterol (PROVENTIL HFA;VENTOLIN HFA) 108 (90 BASE) MCG/ACT inhaler Inhale 2 puffs into the lungs every 6 (six) hours as needed. Shortness of breath.      Marland Kitchen aspirin EC 81 MG tablet Take 81 mg by mouth every other day.      . citalopram (CELEXA) 40 MG tablet Take 40 mg by mouth daily.      .  clonazePAM (KLONOPIN) 0.5 MG tablet Take 0.5 mg by mouth 2 (two) times daily.       . cyclobenzaprine (FLEXERIL) 10 MG tablet Take 10 mg by mouth Every 6 hours as needed for muscle spasms.       . diclofenac (VOLTAREN) 75 MG EC tablet Take 1 tablet (75 mg total) by mouth 2 (two) times daily.  12 tablet  0  . famotidine (PEPCID) 20 MG tablet Take 1 tablet (20 mg total) by mouth 2 (two) times daily.      . Fluticasone-Salmeterol (ADVAIR) 250-50 MCG/DOSE AEPB Inhale 1 puff into the lungs 2 (two) times daily as needed (shortness of breath).       . furosemide (LASIX) 20 MG tablet Take 20 mg by mouth daily.      Marland Kitchen levothyroxine (SYNTHROID, LEVOTHROID) 125 MCG tablet Take 125-187.5 mcg by mouth See admin instructions. Take 1 tablet (125 mcg total) on Sun Tue Wed Fri Sat. 1.5 tabs (187.91mcg) on Monday and Thursday      . lisinopril (PRINIVIL,ZESTRIL) 40 MG tablet Take 40 mg by mouth daily.      . methocarbamol (ROBAXIN) 500 MG tablet Take 1 tablet (500 mg total) by mouth 3 (three) times daily.  21 tablet  0  . oxyCODONE-acetaminophen (PERCOCET) 10-325 MG per tablet Take 1 tablet by mouth every 3 (three) hours.       . chlorpheniramine-HYDROcodone (TUSSIONEX PENNKINETIC ER) 10-8 MG/5ML LQCR Take 5 mLs by mouth 2 (two) times daily as needed for cough.        Results for orders placed during the hospital encounter of 12/30/13 (from the past 48 hour(s))  GLUCOSE, CAPILLARY     Status: None   Collection Time    12/30/13  6:32 AM      Result Value Ref Range   Glucose-Capillary 97  70 - 99 mg/dL   No results found.  Review of Systems  Constitutional: Negative.   Eyes: Negative.   Respiratory: Negative.   Cardiovascular: Negative.   Gastrointestinal: Negative.   Genitourinary: Negative.   Musculoskeletal: Positive for back pain, myalgias and neck pain.  Skin: Negative.   Neurological: Positive for tingling, sensory change and focal weakness.  Endo/Heme/Allergies: Negative.    Psychiatric/Behavioral: Negative.  Blood pressure 128/63, pulse 78, temperature 97.6 F (36.4 C), temperature source Oral, resp. rate 18, SpO2 99.00%. Physical Exam  Constitutional: She is oriented to person, place, and time. She appears well-developed and well-nourished.  HENT:  Head: Normocephalic and atraumatic.  Eyes: EOM are normal. Pupils are equal, round, and reactive to light.  Cardiovascular: Normal rate and intact distal pulses.   Respiratory: Effort normal.  GI: Soft.  Musculoskeletal:  + Spurling sign with extension and turning to the left with pain radiating to the left shoulder and left arm. Weak abduction  Left >right shoulder.  Weakness with left wrist volar flexion and finger extension. Reflexes +2 and symmetric biceps,triceps, and brachioradialis   Neurological: She is alert and oriented to person, place, and time.  Skin: Skin is warm and dry.     Assessment/Plan Cervical spondylosis C4-5 and  C5-6  PLAN:  ACDF C4-5 and C5-6 with allograft,plate and screws.  Amron Guerrette E 12/30/2013, 7:27 AM

## 2013-12-29 MED ORDER — CLINDAMYCIN PHOSPHATE 900 MG/50ML IV SOLN
900.0000 mg | INTRAVENOUS | Status: AC
  Administered 2013-12-30: 900 mg via INTRAVENOUS
  Filled 2013-12-29: qty 50

## 2013-12-29 MED ORDER — VANCOMYCIN HCL IN DEXTROSE 1-5 GM/200ML-% IV SOLN
1000.0000 mg | INTRAVENOUS | Status: AC
  Administered 2013-12-30: 1000 mg via INTRAVENOUS
  Filled 2013-12-29: qty 200

## 2013-12-30 ENCOUNTER — Ambulatory Visit (HOSPITAL_COMMUNITY): Payer: 59 | Admitting: Certified Registered Nurse Anesthetist

## 2013-12-30 ENCOUNTER — Inpatient Hospital Stay (HOSPITAL_COMMUNITY)
Admission: RE | Admit: 2013-12-30 | Discharge: 2014-01-01 | DRG: 473 | Disposition: A | Discharge status: Home / Self Care | Payer: 59 | Source: Ambulatory Visit | Attending: Specialist | Admitting: Specialist

## 2013-12-30 ENCOUNTER — Encounter (HOSPITAL_COMMUNITY): Payer: Self-pay | Admitting: Surgery

## 2013-12-30 ENCOUNTER — Encounter (HOSPITAL_COMMUNITY): Payer: 59 | Admitting: Certified Registered Nurse Anesthetist

## 2013-12-30 ENCOUNTER — Inpatient Hospital Stay (HOSPITAL_COMMUNITY): Payer: 59

## 2013-12-30 ENCOUNTER — Encounter (HOSPITAL_COMMUNITY)
Admission: RE | Disposition: A | Discharge status: Home / Self Care | Payer: Self-pay | Source: Ambulatory Visit | Attending: Specialist

## 2013-12-30 ENCOUNTER — Ambulatory Visit (HOSPITAL_COMMUNITY): Payer: 59

## 2013-12-30 DIAGNOSIS — M47812 Spondylosis without myelopathy or radiculopathy, cervical region: Secondary | ICD-10-CM | POA: Diagnosis present

## 2013-12-30 DIAGNOSIS — G4730 Sleep apnea, unspecified: Secondary | ICD-10-CM | POA: Diagnosis present

## 2013-12-30 DIAGNOSIS — F329 Major depressive disorder, single episode, unspecified: Secondary | ICD-10-CM | POA: Diagnosis present

## 2013-12-30 DIAGNOSIS — K219 Gastro-esophageal reflux disease without esophagitis: Secondary | ICD-10-CM | POA: Diagnosis present

## 2013-12-30 DIAGNOSIS — R29898 Other symptoms and signs involving the musculoskeletal system: Secondary | ICD-10-CM | POA: Diagnosis present

## 2013-12-30 DIAGNOSIS — J438 Other emphysema: Secondary | ICD-10-CM | POA: Diagnosis present

## 2013-12-30 DIAGNOSIS — M79609 Pain in unspecified limb: Secondary | ICD-10-CM | POA: Diagnosis present

## 2013-12-30 DIAGNOSIS — F3289 Other specified depressive episodes: Secondary | ICD-10-CM | POA: Diagnosis present

## 2013-12-30 DIAGNOSIS — E785 Hyperlipidemia, unspecified: Secondary | ICD-10-CM | POA: Diagnosis present

## 2013-12-30 DIAGNOSIS — M4316 Spondylolisthesis, lumbar region: Secondary | ICD-10-CM | POA: Diagnosis present

## 2013-12-30 DIAGNOSIS — G35 Multiple sclerosis: Secondary | ICD-10-CM | POA: Diagnosis present

## 2013-12-30 DIAGNOSIS — IMO0001 Reserved for inherently not codable concepts without codable children: Secondary | ICD-10-CM | POA: Diagnosis present

## 2013-12-30 DIAGNOSIS — I69998 Other sequelae following unspecified cerebrovascular disease: Secondary | ICD-10-CM | POA: Diagnosis not present

## 2013-12-30 DIAGNOSIS — Z7982 Long term (current) use of aspirin: Secondary | ICD-10-CM | POA: Diagnosis not present

## 2013-12-30 DIAGNOSIS — Z79899 Other long term (current) drug therapy: Secondary | ICD-10-CM | POA: Diagnosis not present

## 2013-12-30 DIAGNOSIS — I509 Heart failure, unspecified: Secondary | ICD-10-CM | POA: Diagnosis present

## 2013-12-30 DIAGNOSIS — E039 Hypothyroidism, unspecified: Secondary | ICD-10-CM | POA: Diagnosis present

## 2013-12-30 DIAGNOSIS — G589 Mononeuropathy, unspecified: Secondary | ICD-10-CM | POA: Diagnosis present

## 2013-12-30 DIAGNOSIS — Z8673 Personal history of transient ischemic attack (TIA), and cerebral infarction without residual deficits: Secondary | ICD-10-CM

## 2013-12-30 DIAGNOSIS — M19019 Primary osteoarthritis, unspecified shoulder: Secondary | ICD-10-CM | POA: Diagnosis present

## 2013-12-30 DIAGNOSIS — I10 Essential (primary) hypertension: Secondary | ICD-10-CM | POA: Diagnosis present

## 2013-12-30 HISTORY — PX: ANTERIOR CERVICAL DECOMP/DISCECTOMY FUSION: SHX1161

## 2013-12-30 LAB — GLUCOSE, CAPILLARY
GLUCOSE-CAPILLARY: 176 mg/dL — AB (ref 70–99)
GLUCOSE-CAPILLARY: 142 mg/dL — AB (ref 70–99)
Glucose-Capillary: 97 mg/dL (ref 70–99)

## 2013-12-30 SURGERY — ANTERIOR CERVICAL DECOMPRESSION/DISCECTOMY FUSION 2 LEVELS
Anesthesia: General | Site: Spine Cervical

## 2013-12-30 MED ORDER — MENTHOL 3 MG MT LOZG: 1.0000 | LOZENGE | OROMUCOSAL | Status: DC | PRN

## 2013-12-30 MED ORDER — FENTANYL CITRATE 0.05 MG/ML IJ SOLN: 50.0000 ug | Freq: Once | INTRAMUSCULAR | Status: DC

## 2013-12-30 MED ORDER — HYDROMORPHONE HCL PF 1 MG/ML IJ SOLN
INTRAMUSCULAR | Status: AC
  Administered 2013-12-30: 17:00:00
  Filled 2013-12-30: qty 1

## 2013-12-30 MED ORDER — MIDAZOLAM HCL 5 MG/5ML IJ SOLN
INTRAMUSCULAR | Status: DC | PRN
  Administered 2013-12-30 (×2): 1 mg via INTRAVENOUS

## 2013-12-30 MED ORDER — SUCCINYLCHOLINE CHLORIDE 20 MG/ML IJ SOLN
INTRAMUSCULAR | Status: DC | PRN
  Administered 2013-12-30: 100 mg via INTRAVENOUS

## 2013-12-30 MED ORDER — LIDOCAINE HCL (CARDIAC) 20 MG/ML IV SOLN
INTRAVENOUS | Status: AC
  Filled 2013-12-30: qty 5

## 2013-12-30 MED ORDER — LACTATED RINGERS IV SOLN
INTRAVENOUS | Status: DC | PRN
  Administered 2013-12-30 (×2): via INTRAVENOUS

## 2013-12-30 MED ORDER — OXYCODONE HCL 5 MG/5ML PO SOLN: 5.0000 mg | Freq: Once | ORAL | Status: AC | PRN

## 2013-12-30 MED ORDER — CITALOPRAM HYDROBROMIDE 40 MG PO TABS
40.0000 mg | ORAL_TABLET | Freq: Every day | ORAL | Status: DC
  Administered 2013-12-30 – 2014-01-01 (×3): 40 mg via ORAL
  Filled 2013-12-30 (×3): qty 1

## 2013-12-30 MED ORDER — ONDANSETRON HCL 4 MG/2ML IJ SOLN
INTRAMUSCULAR | Status: DC | PRN
  Administered 2013-12-30: 4 mg via INTRAVENOUS

## 2013-12-30 MED ORDER — LIDOCAINE HCL 4 % MT SOLN
OROMUCOSAL | Status: DC | PRN
  Administered 2013-12-30: 4 mL via TOPICAL

## 2013-12-30 MED ORDER — ACETAMINOPHEN 325 MG PO TABS
650.0000 mg | ORAL_TABLET | ORAL | Status: DC | PRN
  Filled 2013-12-30 (×2): qty 2

## 2013-12-30 MED ORDER — PROPOFOL 10 MG/ML IV BOLUS
INTRAVENOUS | Status: DC | PRN
  Administered 2013-12-30: 180 mg via INTRAVENOUS

## 2013-12-30 MED ORDER — HYDROMORPHONE HCL PF 1 MG/ML IJ SOLN
0.2500 mg | INTRAMUSCULAR | Status: DC | PRN
  Administered 2013-12-30 (×3): 0.5 mg via INTRAVENOUS

## 2013-12-30 MED ORDER — FAMOTIDINE 20 MG PO TABS
20.0000 mg | ORAL_TABLET | Freq: Two times a day (BID) | ORAL | Status: DC
  Administered 2013-12-30 – 2014-01-01 (×4): 20 mg via ORAL
  Filled 2013-12-30 (×5): qty 1

## 2013-12-30 MED ORDER — EPHEDRINE SULFATE 50 MG/ML IJ SOLN
INTRAMUSCULAR | Status: DC | PRN
  Administered 2013-12-30 (×5): 10 mg via INTRAVENOUS

## 2013-12-30 MED ORDER — OXYCODONE-ACETAMINOPHEN 5-325 MG PO TABS
1.0000 | ORAL_TABLET | ORAL | Status: DC | PRN
  Administered 2013-12-30 – 2014-01-01 (×11): 2 via ORAL
  Filled 2013-12-30 (×11): qty 2

## 2013-12-30 MED ORDER — LEVOTHYROXINE SODIUM 125 MCG PO TABS
187.5000 ug | ORAL_TABLET | ORAL | Status: DC
  Filled 2013-12-30: qty 1.5

## 2013-12-30 MED ORDER — HYDROMORPHONE HCL PF 1 MG/ML IJ SOLN
INTRAMUSCULAR | Status: AC
  Administered 2013-12-30: 13:00:00
  Filled 2013-12-30: qty 1

## 2013-12-30 MED ORDER — HYDROMORPHONE HCL PF 1 MG/ML IJ SOLN
INTRAMUSCULAR | Status: AC
  Administered 2013-12-30: 0.5 mg via INTRAVENOUS
  Filled 2013-12-30: qty 1

## 2013-12-30 MED ORDER — OXYCODONE HCL 5 MG PO TABS
5.0000 mg | ORAL_TABLET | Freq: Once | ORAL | Status: AC | PRN
  Administered 2013-12-30: 5 mg via ORAL

## 2013-12-30 MED ORDER — DEXAMETHASONE SODIUM PHOSPHATE 10 MG/ML IJ SOLN
INTRAMUSCULAR | Status: AC
  Filled 2013-12-30: qty 1

## 2013-12-30 MED ORDER — HYDROMORPHONE HCL PF 1 MG/ML IJ SOLN
0.2500 mg | INTRAMUSCULAR | Status: DC | PRN
  Administered 2013-12-30 (×4): 0.5 mg via INTRAVENOUS

## 2013-12-30 MED ORDER — FENTANYL CITRATE 0.05 MG/ML IJ SOLN
INTRAMUSCULAR | Status: AC
  Filled 2013-12-30: qty 5

## 2013-12-30 MED ORDER — METHOCARBAMOL 500 MG PO TABS
ORAL_TABLET | ORAL | Status: AC
  Administered 2013-12-30: 12:00:00
  Filled 2013-12-30: qty 1

## 2013-12-30 MED ORDER — ACETAMINOPHEN 650 MG RE SUPP: 650.0000 mg | RECTAL | Status: DC | PRN

## 2013-12-30 MED ORDER — BUPIVACAINE-EPINEPHRINE (PF) 0.5% -1:200000 IJ SOLN
INTRAMUSCULAR | Status: AC
  Filled 2013-12-30: qty 10

## 2013-12-30 MED ORDER — PANTOPRAZOLE SODIUM 40 MG IV SOLR
40.0000 mg | Freq: Every day | INTRAVENOUS | Status: DC
  Administered 2013-12-30: 40 mg via INTRAVENOUS
  Filled 2013-12-30 (×4): qty 40

## 2013-12-30 MED ORDER — MIDAZOLAM HCL 2 MG/2ML IJ SOLN
INTRAMUSCULAR | Status: AC
  Filled 2013-12-30: qty 2

## 2013-12-30 MED ORDER — SODIUM CHLORIDE 0.9 % IJ SOLN
INTRAMUSCULAR | Status: AC
  Filled 2013-12-30: qty 9

## 2013-12-30 MED ORDER — LIDOCAINE HCL (CARDIAC) 20 MG/ML IV SOLN
INTRAVENOUS | Status: DC | PRN
  Administered 2013-12-30: 100 mg via INTRAVENOUS

## 2013-12-30 MED ORDER — ONDANSETRON HCL 4 MG/2ML IJ SOLN
4.0000 mg | INTRAMUSCULAR | Status: DC | PRN
  Administered 2013-12-30: 4 mg via INTRAVENOUS

## 2013-12-30 MED ORDER — DIAZEPAM 5 MG/ML IJ SOLN
5.0000 mg | Freq: Once | INTRAMUSCULAR | Status: AC
  Administered 2013-12-30: 5 mg via INTRAVENOUS

## 2013-12-30 MED ORDER — EPHEDRINE SULFATE 50 MG/ML IJ SOLN
INTRAMUSCULAR | Status: AC
  Filled 2013-12-30: qty 1

## 2013-12-30 MED ORDER — NEOSTIGMINE METHYLSULFATE 1 MG/ML IJ SOLN
INTRAMUSCULAR | Status: DC | PRN
  Administered 2013-12-30: 3 mg via INTRAVENOUS

## 2013-12-30 MED ORDER — METHOCARBAMOL 500 MG PO TABS
500.0000 mg | ORAL_TABLET | Freq: Four times a day (QID) | ORAL | Status: DC | PRN
  Administered 2013-12-30 – 2014-01-01 (×7): 500 mg via ORAL
  Filled 2013-12-30 (×7): qty 1

## 2013-12-30 MED ORDER — ASPIRIN EC 81 MG PO TBEC
81.0000 mg | DELAYED_RELEASE_TABLET | ORAL | Status: DC
  Administered 2013-12-31: 81 mg via ORAL
  Filled 2013-12-30: qty 1

## 2013-12-30 MED ORDER — SODIUM CHLORIDE 0.9 % IV SOLN: 250.0000 mL | INTRAVENOUS | Status: DC

## 2013-12-30 MED ORDER — OXYCODONE-ACETAMINOPHEN 5-325 MG PO TABS: 1.0000 | ORAL_TABLET | ORAL | Status: DC | PRN

## 2013-12-30 MED ORDER — NEOSTIGMINE METHYLSULFATE 1 MG/ML IJ SOLN
INTRAMUSCULAR | Status: AC
  Filled 2013-12-30: qty 10

## 2013-12-30 MED ORDER — MORPHINE SULFATE 2 MG/ML IJ SOLN
1.0000 mg | INTRAMUSCULAR | Status: DC | PRN
  Administered 2013-12-30 – 2014-01-01 (×10): 2 mg via INTRAVENOUS
  Filled 2013-12-30 (×10): qty 1

## 2013-12-30 MED ORDER — MOMETASONE FURO-FORMOTEROL FUM 100-5 MCG/ACT IN AERO
2.0000 | INHALATION_SPRAY | Freq: Two times a day (BID) | RESPIRATORY_TRACT | Status: DC
  Administered 2013-12-30 – 2014-01-01 (×4): 2 via RESPIRATORY_TRACT
  Filled 2013-12-30: qty 8.8

## 2013-12-30 MED ORDER — PHENOL 1.4 % MT LIQD: 1.0000 | OROMUCOSAL | Status: DC | PRN

## 2013-12-30 MED ORDER — ALBUTEROL SULFATE (2.5 MG/3ML) 0.083% IN NEBU: 3.0000 mL | INHALATION_SOLUTION | Freq: Four times a day (QID) | RESPIRATORY_TRACT | Status: DC | PRN

## 2013-12-30 MED ORDER — HYDROCOD POLST-CHLORPHEN POLST 10-8 MG/5ML PO LQCR: 5.0000 mL | Freq: Two times a day (BID) | ORAL | Status: DC | PRN

## 2013-12-30 MED ORDER — STERILE WATER FOR INJECTION IJ SOLN
INTRAMUSCULAR | Status: AC
  Filled 2013-12-30: qty 10

## 2013-12-30 MED ORDER — DEXAMETHASONE SODIUM PHOSPHATE 10 MG/ML IJ SOLN
INTRAMUSCULAR | Status: DC | PRN
  Administered 2013-12-30: 10 mg via INTRAVENOUS

## 2013-12-30 MED ORDER — THROMBIN 20000 UNITS EX SOLR
CUTANEOUS | Status: AC
  Filled 2013-12-30: qty 20000

## 2013-12-30 MED ORDER — ONDANSETRON HCL 4 MG/2ML IJ SOLN
INTRAMUSCULAR | Status: AC
  Filled 2013-12-30: qty 2

## 2013-12-30 MED ORDER — KETOROLAC TROMETHAMINE 30 MG/ML IJ SOLN
30.0000 mg | Freq: Four times a day (QID) | INTRAMUSCULAR | Status: AC
  Administered 2013-12-30 – 2013-12-31 (×4): 30 mg via INTRAVENOUS
  Filled 2013-12-30 (×3): qty 1

## 2013-12-30 MED ORDER — INSULIN ASPART 100 UNIT/ML ~~LOC~~ SOLN
0.0000 [IU] | Freq: Three times a day (TID) | SUBCUTANEOUS | Status: DC
  Administered 2013-12-31 – 2014-01-01 (×4): 1 [IU] via SUBCUTANEOUS

## 2013-12-30 MED ORDER — MIDAZOLAM HCL 2 MG/2ML IJ SOLN: 1.0000 mg | INTRAMUSCULAR | Status: DC | PRN

## 2013-12-30 MED ORDER — THROMBIN 20000 UNITS EX SOLR
CUTANEOUS | Status: DC | PRN
  Administered 2013-12-30: 09:00:00 via TOPICAL

## 2013-12-30 MED ORDER — METHOCARBAMOL 500 MG PO TABS: 500.0000 mg | ORAL_TABLET | Freq: Four times a day (QID) | ORAL | Status: DC | PRN

## 2013-12-30 MED ORDER — BUPIVACAINE-EPINEPHRINE 0.5% -1:200000 IJ SOLN
INTRAMUSCULAR | Status: DC | PRN
  Administered 2013-12-30: 8 mL

## 2013-12-30 MED ORDER — KETOROLAC TROMETHAMINE 30 MG/ML IJ SOLN
INTRAMUSCULAR | Status: AC
  Administered 2013-12-30: 30 mg via INTRAVENOUS
  Filled 2013-12-30: qty 1

## 2013-12-30 MED ORDER — GLYCOPYRROLATE 0.2 MG/ML IJ SOLN
INTRAMUSCULAR | Status: DC | PRN
  Administered 2013-12-30: .4 mg via INTRAVENOUS

## 2013-12-30 MED ORDER — 0.9 % SODIUM CHLORIDE (POUR BTL) OPTIME
TOPICAL | Status: DC | PRN
  Administered 2013-12-30: 1000 mL

## 2013-12-30 MED ORDER — SUCCINYLCHOLINE CHLORIDE 20 MG/ML IJ SOLN
INTRAMUSCULAR | Status: AC
  Filled 2013-12-30: qty 1

## 2013-12-30 MED ORDER — LISINOPRIL 40 MG PO TABS
40.0000 mg | ORAL_TABLET | Freq: Every day | ORAL | Status: DC
  Administered 2013-12-30 – 2014-01-01 (×3): 40 mg via ORAL
  Filled 2013-12-30 (×3): qty 1

## 2013-12-30 MED ORDER — DEXTROSE 5 % IV SOLN
10.0000 mg | INTRAVENOUS | Status: DC | PRN
  Administered 2013-12-30: 20 ug/min via INTRAVENOUS

## 2013-12-30 MED ORDER — CLONAZEPAM 0.5 MG PO TABS
0.5000 mg | ORAL_TABLET | Freq: Two times a day (BID) | ORAL | Status: DC
  Administered 2013-12-30 – 2014-01-01 (×4): 0.5 mg via ORAL
  Filled 2013-12-30 (×4): qty 1

## 2013-12-30 MED ORDER — KCL IN DEXTROSE-NACL 20-5-0.45 MEQ/L-%-% IV SOLN
INTRAVENOUS | Status: DC
  Administered 2013-12-30: 23:00:00 via INTRAVENOUS
  Filled 2013-12-30 (×6): qty 1000

## 2013-12-30 MED ORDER — METHOCARBAMOL 100 MG/ML IJ SOLN
500.0000 mg | Freq: Four times a day (QID) | INTRAVENOUS | Status: DC | PRN
  Filled 2013-12-30: qty 5

## 2013-12-30 MED ORDER — CITRIC ACID-SODIUM CITRATE 334-500 MG/5ML PO SOLN
30.0000 mL | ORAL | Status: AC
  Administered 2013-12-30: 30 mL via ORAL
  Filled 2013-12-30: qty 30

## 2013-12-30 MED ORDER — ROCURONIUM BROMIDE 50 MG/5ML IV SOLN
INTRAVENOUS | Status: AC
  Filled 2013-12-30: qty 1

## 2013-12-30 MED ORDER — ONDANSETRON HCL 4 MG/2ML IJ SOLN
INTRAMUSCULAR | Status: AC
  Administered 2013-12-30: 15:00:00
  Filled 2013-12-30: qty 2

## 2013-12-30 MED ORDER — FUROSEMIDE 20 MG PO TABS
20.0000 mg | ORAL_TABLET | Freq: Every day | ORAL | Status: DC
  Administered 2013-12-30 – 2014-01-01 (×3): 20 mg via ORAL
  Filled 2013-12-30 (×3): qty 1

## 2013-12-30 MED ORDER — LEVOTHYROXINE SODIUM 125 MCG PO TABS
125.0000 ug | ORAL_TABLET | ORAL | Status: DC
  Administered 2013-12-31 – 2014-01-01 (×3): 125 ug via ORAL
  Filled 2013-12-30 (×4): qty 1

## 2013-12-30 MED ORDER — LEVOTHYROXINE SODIUM 125 MCG PO TABS: 125.0000 ug | ORAL_TABLET | ORAL | Status: DC

## 2013-12-30 MED ORDER — SODIUM CHLORIDE 0.9 % IJ SOLN
3.0000 mL | Freq: Two times a day (BID) | INTRAMUSCULAR | Status: DC
Start: 2013-12-30 — End: 2014-01-01
  Administered 2013-12-31 – 2014-01-01 (×2): 3 mL via INTRAVENOUS

## 2013-12-30 MED ORDER — OXYCODONE HCL 5 MG PO TABS
ORAL_TABLET | ORAL | Status: AC
  Administered 2013-12-30: 12:00:00
  Filled 2013-12-30: qty 1

## 2013-12-30 MED ORDER — PROMETHAZINE HCL 25 MG/ML IJ SOLN
INTRAMUSCULAR | Status: AC
  Administered 2013-12-30: 13:00:00
  Filled 2013-12-30: qty 1

## 2013-12-30 MED ORDER — DIAZEPAM 5 MG/ML IJ SOLN
INTRAMUSCULAR | Status: AC
  Administered 2013-12-30: 13:00:00
  Filled 2013-12-30: qty 2

## 2013-12-30 MED ORDER — GLYCOPYRROLATE 0.2 MG/ML IJ SOLN
INTRAMUSCULAR | Status: AC
  Filled 2013-12-30: qty 2

## 2013-12-30 MED ORDER — SODIUM CHLORIDE 0.9 % IJ SOLN: 3.0000 mL | INTRAMUSCULAR | Status: DC | PRN

## 2013-12-30 MED ORDER — PROMETHAZINE HCL 25 MG/ML IJ SOLN
6.2500 mg | INTRAMUSCULAR | Status: DC | PRN
  Administered 2013-12-30: 6.25 mg via INTRAVENOUS

## 2013-12-30 MED ORDER — FENTANYL CITRATE 0.05 MG/ML IJ SOLN
INTRAMUSCULAR | Status: DC | PRN
  Administered 2013-12-30 (×5): 50 ug via INTRAVENOUS

## 2013-12-30 MED ORDER — ROCURONIUM BROMIDE 100 MG/10ML IV SOLN
INTRAVENOUS | Status: DC | PRN
  Administered 2013-12-30: 40 mg via INTRAVENOUS
  Administered 2013-12-30: 10 mg via INTRAVENOUS

## 2013-12-30 MED ORDER — ALBUMIN HUMAN 5 % IV SOLN
INTRAVENOUS | Status: DC | PRN
  Administered 2013-12-30: 10:00:00 via INTRAVENOUS

## 2013-12-30 MED ORDER — PROPOFOL 10 MG/ML IV BOLUS
INTRAVENOUS | Status: AC
  Filled 2013-12-30: qty 20

## 2013-12-30 MED ORDER — ALBUTEROL SULFATE HFA 108 (90 BASE) MCG/ACT IN AERS
INHALATION_SPRAY | RESPIRATORY_TRACT | Status: DC | PRN
  Administered 2013-12-30: 4 via RESPIRATORY_TRACT

## 2013-12-30 MED ORDER — ALBUTEROL SULFATE HFA 108 (90 BASE) MCG/ACT IN AERS
INHALATION_SPRAY | RESPIRATORY_TRACT | Status: AC
  Filled 2013-12-30: qty 6.7

## 2013-12-30 SURGICAL SUPPLY — 60 items
ADH SKN CLS APL DERMABOND .7 (GAUZE/BANDAGES/DRESSINGS) ×1
BLADE AVERAGE 25X9 (BLADE) IMPLANT
BLADE SURG ROTATE 9660 (MISCELLANEOUS) IMPLANT
BUR ROUND FLUTED 4 SOFT TCH (BURR) ×2 IMPLANT
BUR SABER RD CUTTING 3.0 (BURR) IMPLANT
CLOTH BEACON ORANGE TIMEOUT ST (SAFETY) ×2 IMPLANT
COMPOSITE CERV ALLOGRAFT (Orthopedic Implant) ×1 IMPLANT
COMPOSITE CERV ALLOGRAFT 8MM (Orthopedic Implant) ×1 IMPLANT
CORDS BIPOLAR (ELECTRODE) ×2 IMPLANT
COVER SURGICAL LIGHT HANDLE (MISCELLANEOUS) ×2 IMPLANT
DERMABOND ADVANCED (GAUZE/BANDAGES/DRESSINGS) ×1
DERMABOND ADVANCED .7 DNX12 (GAUZE/BANDAGES/DRESSINGS) ×1 IMPLANT
DRAIN TLS ROUND 10FR (DRAIN) IMPLANT
DRAPE C-ARM 42X72 X-RAY (DRAPES) ×2 IMPLANT
DRAPE MICROSCOPE LEICA (MISCELLANEOUS) ×2 IMPLANT
DRAPE POUCH INSTRU U-SHP 10X18 (DRAPES) ×2 IMPLANT
DRAPE SURG 17X23 STRL (DRAPES) ×12 IMPLANT
DRSG MEPILEX BORDER 4X4 (GAUZE/BANDAGES/DRESSINGS) ×1 IMPLANT
DURAPREP 6ML APPLICATOR 50/CS (WOUND CARE) ×2 IMPLANT
ELECT BLADE 4.0 EZ CLEAN MEGAD (MISCELLANEOUS) ×2
ELECT COATED BLADE 2.86 ST (ELECTRODE) ×2 IMPLANT
ELECT REM PT RETURN 9FT ADLT (ELECTROSURGICAL) ×2
ELECTRODE BLDE 4.0 EZ CLN MEGD (MISCELLANEOUS) IMPLANT
ELECTRODE REM PT RTRN 9FT ADLT (ELECTROSURGICAL) ×1 IMPLANT
GLOVE BIOGEL PI IND STRL 7.5 (GLOVE) ×1 IMPLANT
GLOVE BIOGEL PI INDICATOR 7.5 (GLOVE) ×1
GLOVE ECLIPSE 7.0 STRL STRAW (GLOVE) ×3 IMPLANT
GLOVE ECLIPSE 8.5 STRL (GLOVE) ×2 IMPLANT
GLOVE SURG 8.5 LATEX PF (GLOVE) ×2 IMPLANT
GOWN PREVENTION PLUS LG XLONG (DISPOSABLE) IMPLANT
GOWN STRL NON-REIN LRG LVL3 (GOWN DISPOSABLE) ×4 IMPLANT
GOWN STRL REUS W/TWL 2XL LVL3 (GOWN DISPOSABLE) ×2 IMPLANT
HEAD HALTER (SOFTGOODS) ×2 IMPLANT
KIT BASIN OR (CUSTOM PROCEDURE TRAY) ×2 IMPLANT
KIT ROOM TURNOVER OR (KITS) ×2 IMPLANT
MANIFOLD NEPTUNE II (INSTRUMENTS) ×1 IMPLANT
NDL SPNL 20GX3.5 QUINCKE YW (NEEDLE) ×2 IMPLANT
NEEDLE SPNL 20GX3.5 QUINCKE YW (NEEDLE) ×4 IMPLANT
NS IRRIG 1000ML POUR BTL (IV SOLUTION) ×2 IMPLANT
PACK ORTHO CERVICAL (CUSTOM PROCEDURE TRAY) ×2 IMPLANT
PAD ARMBOARD 7.5X6 YLW CONV (MISCELLANEOUS) ×4 IMPLANT
PATTIES SURGICAL .5 X.5 (GAUZE/BANDAGES/DRESSINGS) ×1 IMPLANT
PIN DISTRACTION 14MM (PIN) ×6 IMPLANT
PLATE 2 LEVEL 34MM (Plate) ×1 IMPLANT
SCREW BONE FIXED SD 4.0X14 (Screw) ×5 IMPLANT
SCREW BONE FIXED SD 4.5XX14 (Screw) ×1 IMPLANT
SPONGE INTESTINAL PEANUT (DISPOSABLE) ×1 IMPLANT
SPONGE LAP 4X18 X RAY DECT (DISPOSABLE) IMPLANT
SPONGE SURGIFOAM ABS GEL 100 (HEMOSTASIS) ×1 IMPLANT
SUT ETHILON 4 0 PS 2 18 (SUTURE) ×3 IMPLANT
SUT VIC AB 2-0 CT1 27 (SUTURE) ×2
SUT VIC AB 2-0 CT1 TAPERPNT 27 (SUTURE) ×1 IMPLANT
SUT VIC AB 3-0 FS2 27 (SUTURE) IMPLANT
SUT VICRYL 4-0 PS2 18IN ABS (SUTURE) IMPLANT
SYR 30ML LL (SYRINGE) ×2 IMPLANT
SYSTEM CHEST DRAIN TLS 7FR (DRAIN) ×1 IMPLANT
TOWEL OR 17X24 6PK STRL BLUE (TOWEL DISPOSABLE) ×2 IMPLANT
TOWEL OR 17X26 10 PK STRL BLUE (TOWEL DISPOSABLE) ×2 IMPLANT
TRAY FOLEY CATH 16FRSI W/METER (SET/KITS/TRAYS/PACK) ×2 IMPLANT
WATER STERILE IRR 1000ML POUR (IV SOLUTION) ×2 IMPLANT

## 2013-12-30 NOTE — Anesthesia Preprocedure Evaluation (Addendum)
Anesthesia Evaluation  Patient identified by MRN, date of birth, ID band Patient awake    Reviewed: Allergy & Precautions, H&P , NPO status , Patient's Chart, lab work & pertinent test results  History of Anesthesia Complications (+) PONV and history of anesthetic complications  Airway Mallampati: II TM Distance: <3 FB Neck ROM: Full    Dental  (+) Dental Advisory Given, Edentulous Upper, Edentulous Lower   Pulmonary shortness of breath and with exertion, asthma , sleep apnea , COPD COPD inhaler, Current Smoker,  + rhonchi         Cardiovascular hypertension, Pt. on medications +CHF Rhythm:Regular Rate:Normal     Neuro/Psych  Headaches, PSYCHIATRIC DISORDERS Depression TIACVA, Residual Symptoms    GI/Hepatic hiatal hernia, GERD-  Medicated,  Endo/Other  diabetesHypothyroidism   Renal/GU      Musculoskeletal  (+) Arthritis -, Osteoarthritis,  Fibromyalgia -  Abdominal   Peds  Hematology   Anesthesia Other Findings   Reproductive/Obstetrics                         Anesthesia Physical Anesthesia Plan  ASA: III  Anesthesia Plan: General   Post-op Pain Management:    Induction: Intravenous  Airway Management Planned: Oral ETT and Video Laryngoscope Planned  Additional Equipment:   Intra-op Plan:   Post-operative Plan: Extubation in OR  Informed Consent: I have reviewed the patients History and Physical, chart, labs and discussed the procedure including the risks, benefits and alternatives for the proposed anesthesia with the patient or authorized representative who has indicated his/her understanding and acceptance.   Dental advisory given  Plan Discussed with: CRNA and Surgeon  Anesthesia Plan Comments:       Anesthesia Quick Evaluation

## 2013-12-30 NOTE — Preoperative (Signed)
Beta Blockers   Reason not to administer Beta Blockers:Not Applicable 

## 2013-12-30 NOTE — Anesthesia Postprocedure Evaluation (Signed)
  Anesthesia Post-op Note  Patient: [name]  Procedure(s) Performed: Procedure(s): ANTERIOR CERVICAL DISCECTOMY FUSION C4-5, C5-6, plate and screws, allograft, local bone graft (N/A)  Patient Location: PACU  Anesthesia Type:General  Level of Consciousness: awake and alert   Airway and Oxygen Therapy: Patient Spontanous Breathing  Post-op Pain: mild  Post-op Assessment: Post-op Vital signs reviewed, Patient's Cardiovascular Status Stable, Respiratory Function Stable, Patent Airway, No signs of Nausea or vomiting and Pain level controlled  Post-op Vital Signs: Reviewed and stable  Complications: No apparent anesthesia complications

## 2013-12-30 NOTE — Anesthesia Procedure Notes (Signed)
Procedure Name: Intubation Date/Time: 12/30/2013 7:49 AM Performed by: Elberta LeatherwoodURNER, Naydeen Speirs E Pre-anesthesia Checklist: Patient identified, Emergency Drugs available, Suction available and Patient being monitored Patient Re-evaluated:Patient Re-evaluated prior to inductionOxygen Delivery Method: Circle system utilized Preoxygenation: Pre-oxygenation with 100% oxygen Intubation Type: IV induction Ventilation: Mask ventilation without difficulty and Oral airway inserted - appropriate to patient size Grade View: Grade I Tube type: Oral Tube size: 7.5 mm Number of attempts: 1 Airway Equipment and Method: Stylet,  Oral airway and Video-laryngoscopy Placement Confirmation: ETT inserted through vocal cords under direct vision,  positive ETCO2 and breath sounds checked- equal and bilateral Secured at: 21 cm Tube secured with: Tape Dental Injury: Teeth and Oropharynx as per pre-operative assessment

## 2013-12-30 NOTE — Interval H&P Note (Signed)
History and Physical Interval Note:  12/30/2013 7:27 AM  [name] L Kreitzer  has presented today for surgery, with the diagnosis of Cervical spondylosis C4-5 and C5-6, Left C5 and C6 radiculopathy  The various methods of treatment have been discussed with the patient and family. After consideration of risks, benefits and other options for treatment, the patient has consented to  Procedure(s): ANTERIOR CERVICAL DISCECTOMY FUSION C4-5, C5-6, plate and screws, allograft, local bone graft (N/A) as a surgical intervention .  The patient's history has been reviewed, patient examined, no change in status, stable for surgery.  I have reviewed the patient's chart and labs.  Questions were answered to the patient's satisfaction.     NITKA,JAMES E

## 2013-12-30 NOTE — Op Note (Signed)
12/30/2013  10:53 AM  PATIENT:  [name]  53 y.o. female  MRN: [id number]  OPERATIVE REPORT  PRE-OPERATIVE DIAGNOSIS:  Cervical spondylosis C4-5 and C5-6, Left C5 and C6 radiculopathy  POST-OPERATIVE DIAGNOSIS:  cervical spondylosis C 4-5 C 5-6 left C5 and C^ radiculopathy  PROCEDURE:  Procedure(s): ANTERIOR CERVICAL DISCECTOMY FUSION C4-5, C5-6, plate and screws, allograft, local bone graft    SURGEON:  Jessy Oto, MD     ASSISTANT:  Phillips Hay, PA-C  (Present throughout the entire procedure and necessary for completion of procedure in a timely manner)     ANESTHESIA:  General, supplemented with local 7 cc marcaine 1/2% infiltrated locally Dr. Marya Amsler Smith/Dr. Oletta Lamas.    COMPLICATIONS:  None.     COMPONENTS:Biomet 34 mm variable axis cervical locking plate, with 5 x 4.0 x 20m locking screws and one 4.5 x 157mrevision screw.  EBL: <50 cc  DRAINS: 7 French TLS drain left anterior neck and Foley to SD.  PROCEDURE:The patient was met in the holding area, and the appropriate left cervical C5-6 and C4-5 level identified and marked with an "X" and my initials. The patient was then transported to OR and was placed on the operative table in a supine position. The patient was then placed under  general anesthesia without difficulty. The patient received appropriate preoperative antibiotic prophylaxis vancomycin 1 gm and cleocin 900 mg.  . Nursing staff inserted a Foley catheter under sterile conditions cervical spine was positioned with a [place] horseshoe and 5 pound cervical halter traction. All pressure points well padded and semi-beach chair position. Padded both arms. Standard prep with DuraPrep solution the anterior cervical spine chest. Draped in the usual manner. Iodine vi drape was used. Standard timeout protocol was carried out identifying the patient procedure side of the procedure and level. The skin the left neck was infiltrated with Marcaine with epinephrine total of  7 cc. This at the level of expected C5 incision and also along the  Lines of Langer. Incision transverse at the C5 level and carried down to the level of the platysma and then medial to sternocleidomastoid muscle. The interval between the trachea and esophagus medially and the carotid sheath laterally was developed as a Metzenbaum scissors and blunt dissection exposing the anterior aspect of cervical spine at the C5-6 level Attention then turned to the C5-6 and C4-5 levels where an 18-gauge spinal needles were placed with sheath intact allowing only a centimeter to extend into the C5-6 and C4-5 to and observed on lateral xray at the C4-5 and anterior aspect of C5-6 level. Handheld Cloward retraction of the soft tissues while identifying the level at C5-6 and C4-5 removing a portion of the anterior aspect of the discs with15 blade scalpel and pituitary. Medial border of the longus collie muscles was carefully elevated bilaterally and self-retaining retractors were introduced the foot of the blade beneath the medial border of the longus colli muscles. Soft tissue overlying the anterior borders of the disc space at C5-6 and C4-5 level carefully debridement of soft tissue back to bony edges. The anterior lip osteophytes were then resected using rongeur. This bone was preserved for later bone grafting purposes.Distraction pins placed first at the C5-6 level.The C5-6 disc space was then first prepared using loupe magnification and headlight with resection of degenerative disc annulus anteriorly and posteriorly and cartilaginous endplates using micro-curettes pituitaries and a high-speed bur. The operating room microscope was draped sterilely and brought into the field the C-arm previously had  been draped sterilely prior to its use. Under the operating room microscope and posterior aspect of the disc was excised using micro-curettes pituitary rongeur and times per. Posterior lip osteophytes were drilled using a high-speed  bur and a carefully resected with 1 and 2 mm Kerrison foraminotomy performed over both C6 nerve roots using 1 and 2 mm Kerrisons disc herniation material was noted centrally and into the left foramen some of which represented reflected portion of the patient's annulus into the neuroforamen. Following decompression of the spinal cord at both C6 nerve roots irrigation was carried out. The endplates of the inferior aspect of C5 and superior aspect of C6 were carefully prepared using a high-speed bur to parallel. The disc space was then sounded utilized and a precontoured lordotic sounder for the lordotic allograft.  Sounding was carried up to a 9 mm implant.  9 mm spacer was felt to provide best fit for the C5-6 disc space. The 9 mm lordotic allograft implant was then brought onto the field. It was then charged with normal saline. The implant was then carefully placed over the intervertebral disc space at C5-6 level. Care taken to ensure that no bone or soft tissue debris within the disc space that could be retropulsed with insertion of  bone graft. The graft   was then impacted into place with the head placed in longitudinal cervical traction.  The graft was felt to be in excellent position alignment. Distraction pin at the C6 removed. Attention then turned to the C4-5 level. The Boss McCollough retractor replaced at the C4-5 level. The foot of the blades medial to the longus colli muscles. A distraction screw post placed at the C4 level and distraction of the disc space performed.The C4-5 disc space was then first prepared [place] with resection of degenerative disc annulus anteriorly and posteriorly and cartilaginous endplates using micro-curettes pituitaries and a high-speed bur. The operating room microscope  was  used. Under the operating room microscope and posterior aspect of the disc was excised using micro-curettes pituitary rongeur and times per. Posterior lip osteophytes were drilled using a high-speed bur and  a carefully resected with 1 and 2 mm Kerrison foraminotomy performed over both C5 nerve roots using 1 and 2 mm Kerrisons disc herniation material was noted centrally and into the right foramen some of which represented reflected portion of the patient's annulus into the neuroforamen. Additionally there was significant spur into the left side C5 neuroforamen affecting the right C5 nerve. Following decompression of the spinal cord at both C5 nerve roots, irrigation was carried out. Bleeding controlled using some bone wax excess bone wax removed and Gelfoam thrombin-soaked. The endplates of the inferior aspect of C4 and superior aspect of C5 were carefully prepared using a high-speed bur to parallel. The disc space was then sounded utilized and a precontoured lordotic sounder for the lordotic allograft.  Sounding was carried up to a 8 mm implant.  8 mm spacer was felt to provide best fit for the C4-5 disc space. The 8 mm corticocancellous allograft implant was then brought onto the field. It was then charged with normal saline. The implant was then carefully placed over the intervertebral disc space at C4-5 level. Care taken to ensure that no bone or soft tissue debris within the disc space that could be retropulsed with insertion of the bone graft. The graft was then impacted into place with the head placed in longitudinal cervical traction.  The graft was felt to be in excellent position alignment.  Distraction pins at the C5 and C4 removed. This point irrigation was carried out cervical incision site and lower cervical incision site. Esophagus examined and the upper cervical level as well as at the lower cervical level and found to be normal. Irrigation was again carried out there was no active bleeding present.Longitudinal halter traction was discontinued. The length for cervical plate determined with cottonoid string and two bayonet forceps. Anterior lip osteophytes were then resected about the C5-6 and C4-5  levels using a high-speed bur. This area smoothed for application of the plate to the anterior surface of the cervical spine from C4-6 A 34 mm plate chosen. The plate carefully applied to the anterior cervical spine and aligned. A dual hole guide with then use at the C5 level. Drilling 14 mm hole both sides at the C5 level and  14 mm screws were placed. The parallel dual drill hole guide was then moved to the C4 level and  14 mm drill holes placed and the two 14 mm screws placed at C4. The parallel drill hole guide then moved to the C6 level. . Then both sides at the  C6 level were drilled to 14 mm and 14 mm screws placed. Total of 5 x 14 mm screws were placed each locked within the plate quite nicely a single 4.5 revision screw with local bone graft placed on the right C6 side.. Intraoperative lateral radiograph of the cervical spine demonstrate plates and screws and bone grafts to be in good position alignment without any sign of canal impingement or retropulsion of graft unfortunately the lower screws not seen well on the radiograph.A 7 french TLS drain then place in the depths of the incision along the left side of the cervical plate exiting out the lower incision flap, this was sewn in place with a 4-O nylon suture. Irrigation was carried out at cervical incision site and lower cervical incision site. Esophagus examined and the upper cervical level as well as at the lower cervical level and found to be normal.   The incisions were then closed by approximating the deep subcutaneous layers the platysma layer with interrupted 2-0 Vicryl suture and the superficial fascia overlying the sternocleidomastoid muscle with interrupted 3-0 Vicryl sutures. The subcutaneous layers were approximated with interrupted 3-0 Vicryl sutures as were the superficial layers. The skin was closed with a running subcutaneous stitch of 4-0 Vicryl at  the operative C4-6 transverse incision. Skin was approximated with Dermabond. Mepilex  bandage was applied. A Philadelphia collar was then applied to the cervical spine. drapes were removed. Patient's or table was then returned to the flat position. At the end of the cervical spine surgery case all instrument and sponge counts were correct. Patient was then reactivated extubated and returned to recovery room in satisfactory condition.   Phillips Hay PA-C perform the duties of assistant surgeon performing careful retraction of the esophagus and trachea during the initial exposure and careful suctioning of neural elements cervical nerve roots and cervical cord. He was present from the beginning of case to the end of the case and assisted in positioning of the patient in transfer the patient from bed to the stretcher at the end of the case. She closed the incision on the platysma layer to the skin. Applied dressing.   Kathryne Ramella E 12/30/2013, 10:53 AM

## 2013-12-30 NOTE — Discharge Instructions (Signed)
No lifting greater than 10 lbs. No overhead use of arms. °Avoid bending,and twisting neck. °Walk in house for first week them may start to get out slowly increasing distance up to one mile by 3 weeks post op. °Keep incision dry for 3 days, may then bathe and wet incision using a [place] collar when showering. °Call if any fevers >101, chills, or increasing numbness or weakness or increased swelling or drainage. ° °

## 2013-12-30 NOTE — Transfer of Care (Signed)
Immediate Anesthesia Transfer of Care Note  Patient: [name]  Procedure(s) Performed: Procedure(s): ANTERIOR CERVICAL DISCECTOMY FUSION C4-5, C5-6, plate and screws, allograft, local bone graft (N/A)  Patient Location: PACU  Anesthesia Type:General  Level of Consciousness: awake and alert   Airway & Oxygen Therapy: Patient Spontanous Breathing and Patient connected to nasal cannula oxygen  Post-op Assessment: Report given to PACU RN, Post -op Vital signs reviewed and stable and Patient moving all extremities X 4  Post vital signs: Reviewed and stable  Complications: No apparent anesthesia complications

## 2013-12-30 NOTE — Brief Op Note (Signed)
12/30/2013  10:50 AM  PATIENT:  [name]  53 y.o. female  PRE-OPERATIVE DIAGNOSIS:  Cervical spondylosis C4-5 and C5-6, Left C5 and C6 radiculopathy  POST-OPERATIVE DIAGNOSIS:  cervical spondylosis C 4-5 C 5-6 left C5 and C6 radiculopathy  PROCEDURE:  Procedure(s): ANTERIOR CERVICAL DISCECTOMY FUSION C4-5, C5-6, plate and screws, allograft, local bone graft (N/A)  SURGEON:  Surgeon(s) and Role:    * Kerrin ChampagneJames E Mordecai Tindol, MD - Primary  PHYSICIAN ASSISTANT: Maud DeedSheila Vernon, PA-C   ANESTHESIA:   general  EBL:  Total I/O In: 1250 [I.V.:1000; IV Piggyback:250] Out: 700 [Urine:500; Blood:200]  BLOOD ADMINISTERED:none  DRAINS: Urinary Catheter (Foley) and (7 french) TLS Drain(s) to suction in the left anterior neck.   LOCAL MEDICATIONS USED:  MARCAINE    and Amount: 7 ml  SPECIMEN:  No Specimen  DISPOSITION OF SPECIMEN:  N/A  COUNTS:  YES  TOURNIQUET:  * No tourniquets in log *  DICTATION: .Dragon Dictation  PLAN OF CARE: Admit for overnight observation  PATIENT DISPOSITION:  PACU - hemodynamically stable.   Delay start of Pharmacological VTE agent (>24hrs) due to surgical blood loss or risk of bleeding: yes

## 2013-12-31 ENCOUNTER — Encounter (HOSPITAL_COMMUNITY): Payer: Self-pay | Admitting: Specialist

## 2013-12-31 ENCOUNTER — Inpatient Hospital Stay (HOSPITAL_COMMUNITY): Payer: 59

## 2013-12-31 LAB — HEMOGLOBIN A1C
Hgb A1c MFr Bld: 6.3 % — ABNORMAL HIGH (ref <5.7)
Mean Plasma Glucose: 134 mg/dL — ABNORMAL HIGH (ref <117)

## 2013-12-31 LAB — GLUCOSE, CAPILLARY
GLUCOSE-CAPILLARY: 128 mg/dL — AB (ref 70–99)
GLUCOSE-CAPILLARY: 101 mg/dL — AB (ref 70–99)
GLUCOSE-CAPILLARY: 152 mg/dL — AB (ref 70–99)
Glucose-Capillary: 131 mg/dL — ABNORMAL HIGH (ref 70–99)

## 2013-12-31 MED ORDER — MORPHINE SULFATE ER 15 MG PO TBCR
15.0000 mg | EXTENDED_RELEASE_TABLET | Freq: Two times a day (BID) | ORAL | Status: DC
  Administered 2013-12-31 – 2014-01-01 (×3): 15 mg via ORAL
  Filled 2013-12-31 (×3): qty 1

## 2013-12-31 MED ORDER — DEXAMETHASONE SODIUM PHOSPHATE 4 MG/ML IJ SOLN
8.0000 mg | Freq: Once | INTRAMUSCULAR | Status: AC
  Administered 2013-12-31: 8 mg via INTRAVENOUS
  Filled 2013-12-31: qty 2

## 2013-12-31 NOTE — Progress Notes (Signed)
Orthopedic Tech Progress Note Patient Details:  [name] 08-28-1961 [id number] Aspen collar applied.  Ortho Devices Type of Ortho Device: Aspen cervical collar Ortho Device/Splint Interventions: Application   Asia R Thompson 12/31/2013, 3:47 PM

## 2013-12-31 NOTE — Progress Notes (Signed)
Patient ID: [name], female   DOB: 23-Oct-1961, 53 y.o.   MRN: [id number] Subjective: 1 Day Post-Op Procedure(s) (LRB): ANTERIOR CERVICAL DISCECTOMY FUSION C4-5, C5-6, plate and screws, allograft, local bone graft (N/A) Awake, alert and oriented x 4. 4 inches of snow and ice on the road to her home today. C/o left shoulder and clavicle  Pain. Her strength in shoulder abduction is minimally weak 5-/5 remainer of motor is normal c/o numbness left palmar Hand. Tinel's sign negative. Patient reports pain as marked.    Objective:   VITALS:  Temp:  [97.5 F (36.4 C)-98.7 F (37.1 C)] 98.7 F (37.1 C) (02/24 0536) Pulse Rate:  [66-95] 68 (02/24 0536) Resp:  [13-21] 20 (02/24 0536) BP: (106-133)/(54-82) 112/55 mmHg (02/24 0536) SpO2:  [98 %-100 %] 99 % (02/24 0806)  Neurologically intact ABD soft Neurovascular intact Sensation intact distally Intact pulses distally Dorsiflexion/Plantar flexion intact Incision: no drainage and Drain and its suture removed. New dressing applied.   LABS No results found for this basename: HGB, WBC, PLT,  in the last 72 hours No results found for this basename: NA, K, CL, CO2, BUN, CREATININE, GLUCOSE,  in the last 72 hours No results found for this basename: LABPT, INR,  in the last 72 hours   Assessment/Plan: 1 Day Post-Op Procedure(s) (LRB): ANTERIOR CERVICAL DISCECTOMY FUSION C4-5, C5-6, plate and screws, allograft, local bone graft (N/A)  Advance diet Up with therapy D/C IV fluids Plan for discharge tomorrow Will check xray of her left clavicle and shoulder. Give dose of decadron have PT and OT work with patient.Marland Kitchen.   [name] E 12/31/2013, 1:59 PM

## 2014-01-01 LAB — GLUCOSE, CAPILLARY
GLUCOSE-CAPILLARY: 134 mg/dL — AB (ref 70–99)
Glucose-Capillary: 123 mg/dL — ABNORMAL HIGH (ref 70–99)

## 2014-01-01 MED ORDER — MORPHINE SULFATE ER 15 MG PO TBCR: 15.0000 mg | EXTENDED_RELEASE_TABLET | Freq: Two times a day (BID) | ORAL | Status: DC

## 2014-01-01 MED ORDER — PANTOPRAZOLE SODIUM 40 MG PO TBEC: 40.0000 mg | DELAYED_RELEASE_TABLET | Freq: Every day | ORAL | Status: DC

## 2014-01-01 NOTE — Progress Notes (Signed)
Subjective: 2 Days Post-Op Procedure(s) (LRB): ANTERIOR CERVICAL DISCECTOMY FUSION C4-5, C5-6, plate and screws, allograft, local bone graft (N/A) Patient reports pain as moderate.   Left AC joint arthrosis on xray.  Ice helping. Aspen brace applied.  Pt ambulatory.  Still requiring a lot of pain meds. Objective: Vital signs in last 24 hours: Temp:  [97.7 F (36.5 C)-98.2 F (36.8 C)] 97.7 F (36.5 C) (02/25 0621) Pulse Rate:  [75-85] 75 (02/25 0621) Resp:  [18] 18 (02/25 0621) BP: (122-134)/(59-67) 134/67 mmHg (02/25 0621) SpO2:  [94 %-99 %] 99 % (02/25 0621) Weight:  [61.236 kg (135 lb)] 61.236 kg (135 lb) (02/25 0100)  Intake/Output from previous day: 02/24 0701 - 02/25 0700 In: 720 [P.O.:720] Out: -  Intake/Output this shift: Total I/O In: 480 [P.O.:480] Out: -   No results found for this basename: HGB,  in the last 72 hours No results found for this basename: WBC, RBC, HCT, PLT,  in the last 72 hours No results found for this basename: NA, K, CL, CO2, BUN, CREATININE, GLUCOSE, CALCIUM,  in the last 72 hours No results found for this basename: LABPT, INR,  in the last 72 hours  Neurovascular intact Incision: no drainage  Assessment/Plan: 2 Days Post-Op Procedure(s) (LRB): ANTERIOR CERVICAL DISCECTOMY FUSION C4-5, C5-6, plate and screws, allograft, local bone graft (N/A) Advance diet Discharge home OV 2 weeks. rx on chart.  Kiriana Worthington M 01/01/2014, 8:34 AM

## 2014-01-01 NOTE — Progress Notes (Signed)
Patient d/c to home, IV removed, prescriptions given and instructions reviewed. 

## 2014-01-01 NOTE — Evaluation (Addendum)
Physical Therapy Evaluation Patient Details Name: [name] MRN: [id number] DOB: 1961-05-07 Today's Date: 01/01/2014 Time: 3888-7579 PT Time Calculation (min): 28 min  PT Assessment / Plan / Recommendation History of Present Illness  s/p ACDF, pt also with back pain and acromioclavicuar joint pain  Clinical Impression  Patient is s/p above surgery resulting in functional limitations due to the deficits listed below (see PT Problem List).  Patient will benefit from skilled PT to increase their independence and safety with mobility to allow discharge to the venue listed below.       PT Assessment  Patient needs continued PT services    Follow Up Recommendations  Home health PT;Outpatient PT Moving slowly and painfully, and could benefit from HHPT follow up; Still if this is a stretch, given diagnosis and insurance regulations, will recommend outpatient PT follow up; The potential need for Outpatient PT can be addressed at Ortho follow-up appointments.     Does the patient have the potential to tolerate intense rehabilitation      Barriers to Discharge        Equipment Recommendations  Rolling walker with 5" wheels    Recommendations for Other Services     Frequency Min 5X/week    Precautions / Restrictions Precautions Precautions: Cervical Required Braces or Orthoses: Cervical Brace Cervical Brace: Hard collar;At all times   Pertinent Vitals/Pain 8/10 neck and back pain patient repositioned for comfort RN notified      Mobility  Bed Mobility Overal bed mobility: Needs Assistance Bed Mobility: Rolling;Sidelying to Sit Rolling: Min guard Sidelying to sit: Min guard General bed mobility comments: Cues for technique, HOB slightly elevated Transfers Overall transfer level: Needs assistance Equipment used: Rolling walker (2 wheeled) Transfers: Sit to/from Stand Sit to Stand: Min assist General transfer comment: Cues for technique, hand placement; min assist for  rise to standing Ambulation/Gait Ambulation/Gait assistance: Min guard Ambulation Distance (Feet): 50 Feet Assistive device: Rolling walker (2 wheeled) Gait Pattern/deviations: Decreased stride length Gait velocity: slowed Gait velocity interpretation: Below normal speed for age/gender General Gait Details: Short, painful steps; cues for fully upright posture, and to relax shoulders as much as possible Stairs: Yes Stairs assistance: Min guard Stair Management: Two rails;Step to pattern;Forwards Number of Stairs: 3 General stair comments: verbal and demo cues for sequence and technique    Exercises     PT Diagnosis: Difficulty walking;Acute pain  PT Problem List: Decreased strength;Decreased range of motion;Decreased activity tolerance;Decreased balance;Decreased mobility;Decreased knowledge of use of DME;Pain PT Treatment Interventions: DME instruction;Gait training;Stair training;Functional mobility training;Therapeutic activities;Therapeutic exercise;Patient/family education     PT Goals(Current goals can be found in the care plan section) Acute Rehab PT Goals Patient Stated Goal: wants to hurt less PT Goal Formulation: With patient Time For Goal Achievement: 01/08/14 Potential to Achieve Goals: Good  Visit Information  Last PT Received On: 01/01/14 Assistance Needed: +1 History of Present Illness: s/p ACDF, pt also with back pain and acromioclavicuar joint pain       Prior Functioning  Home Living Family/patient expects to be discharged to:: Private residence Living Arrangements: Spouse/significant other Available Help at Discharge: Family;Available 24 hours/day Type of Home: House Home Access: Stairs to enter [employer] of Steps: 4 Entrance Stairs-Rails: Right;Left;Can reach both Home Layout: One level Home Equipment: Cane - single point Prior Function Level of Independence: Independent Communication Communication: No difficulties    Cognition   Cognition Arousal/Alertness: Awake/alert Behavior During Therapy: WFL for tasks assessed/performed Overall Cognitive Status: Within Functional Limits for  tasks assessed    Extremity/Trunk Assessment Upper Extremity Assessment Upper Extremity Assessment: Overall WFL for tasks assessed;LUE deficits/detail LUE Deficits / Details: Acromioclavicular joint pain limiting abduction ROM; Discussed gentle ROM program, not going higher than horizontal, keeping within tolerance to hopefully prevent frozen shoulder Lower Extremity Assessment Lower Extremity Assessment: Generalized weakness Cervical / Trunk Assessment Cervical / Trunk Assessment: Kyphotic (pt with history of back pain)   Balance    End of Session PT - End of Session Equipment Utilized During Treatment: Cervical collar Activity Tolerance: Patient limited by pain Patient left: in chair;with call bell/phone within reach (prepping to eat lunch) Nurse Communication: Mobility status;Patient requests pain meds  GP     Van ClinesGarrigan, Burton Gahan [facility] Centeramff DouglassHolly Starlee [place], [place]PT [phone]  01/01/2014, 1:05 PM

## 2014-01-01 NOTE — Progress Notes (Signed)
Pt has required pain medication including IV morphine about every two hours. She is complaining of pain on the left side of her neck and on her clavicle. Pain medication seems to alleviate her pain, but as soon as she awakens, she calls for her pain medication.

## 2014-01-01 NOTE — Progress Notes (Signed)
Patient examined and lab reviewed with Vernon PA-C. 

## 2014-01-20 NOTE — Discharge Summary (Signed)
Physician Discharge Summary  Patient ID: [name] MRN: [id number] DOB/AGE: 05-12-1961 53 y.o.  Admit date: 12/30/2013 Discharge date: 01/01/2014  Admission Diagnoses:  Cervical spondylosis without myelopathy C4-5 and C5-6  Discharge Diagnoses:  Principal Problem:   Cervical spondylosis without myelopathy Active Problems:   Spondylosis of cervical spine   Spondylolisthesis of lumbar region left shoulder AC joint arthrosis  Past Medical History  Diagnosis Date  . Gastritis   . Fibromyalgia   . Edema of lower extremity   . DDD (degenerative disc disease)   . MS (multiple sclerosis)     early stages  . Neuropathy   . COPD (chronic obstructive pulmonary disease)   . Hiatal hernia   . Gout   . TIA (transient ischemic attack)     Hx: of  . Asthma     Albuterol prn   . Depression     takes Celexa,Clonazepam daily  . Muscle spasm     takes Flexeril daily as needed  . GERD (gastroesophageal reflux disease)     takes Pepcid daily  . CHF (congestive heart failure)     takes Furosemide daily  . Hypothyroidism     takes Synthroid daily  . HTN (hypertension)     takes lisinopril daily  . Complication of anesthesia     hard to wake up  . PONV (postoperative nausea and vomiting)   . Sleep apnea     sleep study done at least 54yrs ago;has CPAP but doesn't use  . Hyperlipidemia     but doesn't take any meds  . Phlebitis   . Emphysema lung   . Shortness of breath     with exertion or stressed   . Pneumonia     hx of;last time in 2014  . History of bronchitis     last time 4-73months ago   . Migraine     has one now  . Migraine   . Stroke     left sided weakness  . Joint pain   . Joint swelling   . Chronic back pain     DDD/scoliosis  . Osteoporosis   . Hemorrhoids   . History of colon polyps   . Urinary frequency   . History of kidney stones     is in her left kidney and has been for couple of yrs and no problems  . Diabetes mellitus     borderline  .  Insomnia     doesn't take any meds for this    Surgeries: Procedure(s): ANTERIOR CERVICAL DISCECTOMY FUSION C4-5, C5-6, plate and screws, allograft, local bone graft on 12/30/2013   Consultants (if any):  none  Discharged Condition: Improved  Hospital Course: [name] is an 53 y.o. female who was admitted 12/30/2013 with a diagnosis of Cervical spondylosis without myelopathy and went to the operating room on 12/30/2013 and underwent the above named procedures.    She was given perioperative antibiotics:  Anti-infectives   Start     Dose/Rate Route Frequency Ordered Stop   12/30/13 0600  vancomycin (VANCOCIN) IVPB 1000 mg/200 mL premix     1,000 mg 200 mL/hr over 60 Minutes Intravenous On call to O.R. 12/29/13 1342 12/30/13 0745   12/30/13 0600  clindamycin (CLEOCIN) IVPB 900 mg     900 mg 100 mL/hr over 30 Minutes Intravenous On call to O.R. 12/29/13 1342 12/30/13 0810    Pt complained with left shoulder pain and radiographs revealed left AC joint arthrosis.  Ice  used and helpful for symptoms. Decadron dose given post op  She was given sequential compression devices, early ambulation for DVT prophylaxis.  She benefited maximally from the hospital stay and there were no complications.    Recent vital signs:  Filed Vitals:   01/01/14 0901  BP: 142/68  Pulse:   Temp:   Resp:     Recent laboratory studies:  Lab Results  Component Value Date   HGB 14.1 12/25/2013   HGB 12.5 08/27/2013   HGB 14.5 08/24/2013   Lab Results  Component Value Date   WBC 7.2 12/25/2013   PLT 245 12/25/2013   Lab Results  Component Value Date   INR 1.0 01/14/2009   Lab Results  Component Value Date   NA 145 12/25/2013   K 3.7 12/25/2013   CL 102 12/25/2013   CO2 30 12/25/2013   BUN 9 12/25/2013   CREATININE 0.73 12/25/2013   GLUCOSE 100* 12/25/2013    Discharge Medications:     Medication List    STOP taking these medications       cyclobenzaprine 10 MG tablet  Commonly known as:   FLEXERIL     diclofenac 75 MG EC tablet  Commonly known as:  VOLTAREN     oxyCODONE-acetaminophen 10-325 MG per tablet  Commonly known as:  PERCOCET  Replaced by:  oxyCODONE-acetaminophen 5-325 MG per tablet      TAKE these medications       albuterol 108 (90 BASE) MCG/ACT inhaler  Commonly known as:  PROVENTIL HFA;VENTOLIN HFA  Inhale 2 puffs into the lungs every 6 (six) hours as needed. Shortness of breath.     aspirin EC 81 MG tablet  Take 81 mg by mouth every other day.     citalopram 40 MG tablet  Commonly known as:  CELEXA  Take 40 mg by mouth daily.     clonazePAM 0.5 MG tablet  Commonly known as:  KLONOPIN  Take 0.5 mg by mouth 2 (two) times daily.     famotidine 20 MG tablet  Commonly known as:  PEPCID  Take 1 tablet (20 mg total) by mouth 2 (two) times daily.     Fluticasone-Salmeterol 250-50 MCG/DOSE Aepb  Commonly known as:  ADVAIR  Inhale 1 puff into the lungs 2 (two) times daily as needed (shortness of breath).     furosemide 20 MG tablet  Commonly known as:  LASIX  Take 20 mg by mouth daily.     levothyroxine 125 MCG tablet  Commonly known as:  SYNTHROID, LEVOTHROID  - Take 125-187.5 mcg by mouth See admin instructions. Take 1 tablet (125 mcg total) on Sun Tue Wed Fri Sat.  - 1.5 tabs (187.40mcg) on Monday and Thursday     lisinopril 40 MG tablet  Commonly known as:  PRINIVIL,ZESTRIL  Take 40 mg by mouth daily.     methocarbamol 500 MG tablet  Commonly known as:  ROBAXIN  Take 1 tablet (500 mg total) by mouth 3 (three) times daily.     methocarbamol 500 MG tablet  Commonly known as:  ROBAXIN  Take 1 tablet (500 mg total) by mouth every 6 (six) hours as needed for muscle spasms (spasm).     morphine 15 MG 12 hr tablet  Commonly known as:  MS CONTIN  Take 1 tablet (15 mg total) by mouth every 12 (twelve) hours.     oxyCODONE-acetaminophen 5-325 MG per tablet  Commonly known as:  ROXICET  Take 1-2 tablets by mouth every  4 (four) hours as  needed for severe pain.     TUSSIONEX PENNKINETIC ER 10-8 MG/5ML Lqcr  Generic drug:  chlorpheniramine-HYDROcodone  Take 5 mLs by mouth 2 (two) times daily as needed for cough.        Diagnostic Studies: Dg Cervical Spine 2 Or 3 Views  12/30/2013   CLINICAL DATA:  Post op ACDF C4-5 and C5-6  EXAM: CERVICAL SPINE - 2-3 VIEW  COMPARISON:  DG CERVICAL SPINE 2-3 VIEWS dated 12/30/2013  FINDINGS: Patient is status post anterior fusion C4-5 and C5-6. Hardware appears intact. The osseous structures are unremarkable. Surgical drain is appreciated at the site.  IMPRESSION: Patient status post ACDF C4-5 and C5-6.   Electronically Signed   By: Salome HolmesHector  Cooper M.D.   On: 12/30/2013 12:50   Dg Cervical Spine 2-3 Views  12/30/2013   CLINICAL DATA:  Anterior cervical spine fusion.  EXAM: CERVICAL SPINE - 2-3 VIEW  COMPARISON:  10/20/2013  FINDINGS: Two operative views of the cervical spine show initial localization of the C4-C5 and C5-C6 disc spaces with 2 surgical needles/probes. Followup image shows placement of an anterior cervical spine fusion plate and screws spanning C4 through C6. The orthopedic hardware is well-seated and aligned as is disc space bone graft material. There is no evidence of an operative complication.  IMPRESSION: Anterior cervical disc fusion from C4 through C6.   Electronically Signed   By: Amie Portlandavid  Ormond M.D.   On: 12/30/2013 10:51   Dg Shoulder Left Port  12/31/2013   CLINICAL DATA:  Left shoulder and arm pain post cervical surgery (ACDF C4-C5 and C5-C6)  EXAM: PORTABLE LEFT SHOULDER - 1 VIEW  COMPARISON:  Portable AP view at 1429 hr compared to 05/16/2013  FINDINGS: Mild AC joint degenerative changes with inferior spur formation.  Bones appear slightly demineralized.  No acute fracture, dislocation or bone destruction.  Visualized left ribs intact.  Anterior plate and screws identified at the cervical spine at C4-C6.  IMPRESSION: Mild left AC joint degenerative changes.   Electronically  Signed   By: Ulyses SouthwardMark  Boles M.D.   On: 12/31/2013 14:57    Disposition: 01-Home or Self Care      Discharge Orders   Future Orders Complete By Expires   Call MD / Call 911  As directed    Comments:     If you experience chest pain or shortness of breath, CALL 911 and be transported to the hospital emergency room.  If you develope a fever above 101 F, pus (white drainage) or increased drainage or redness at the wound, or calf pain, call your surgeon's office.   Constipation Prevention  As directed    Comments:     Drink plenty of fluids.  Prune juice may be helpful.  You may use a stool softener, such as Colace (over the counter) 100 mg twice a day.  Use MiraLax (over the counter) for constipation as needed.   Diet - low sodium heart healthy  As directed    Discharge instructions  As directed    Comments:     No lifting greater than 10 lbs. No overhead use of arms. Avoid bending,and twisting neck. Walk in house for first week them may start to get out slowly increasing distance up to one mile by 3 weeks post op. Keep incision dry for 3 days, may then bathe and wet incision using a [place] collar when showering. Call if any fevers >101, chills, or increasing numbness or weakness or increased swelling  or drainage.   Driving restrictions  As directed    Comments:     No driving   Increase activity slowly as tolerated  As directed    Lifting restrictions  As directed    Comments:     No lifting      Follow-up Information   Follow up with NITKA,JAMES E, MD. Schedule an appointment as soon as possible for a visit in 2 weeks.   Specialty:  Orthopedic Surgery   Contact information:   [address] [place] [address] [phone]        Signed: Wende Neighbors 01/20/2014, 10:05 AM

## 2014-01-28 NOTE — Discharge Summary (Signed)
Patient d/c summary note and lab reviewed.  

## 2014-02-16 IMAGING — CR DG CHEST 2V
2 series · 2 of 2 positions shown · non-contrast
Comparison: October 19, 2011.

CLINICAL DATA: Cough.

CHEST - 2 VIEW

[view not recorded (1 of 2)]
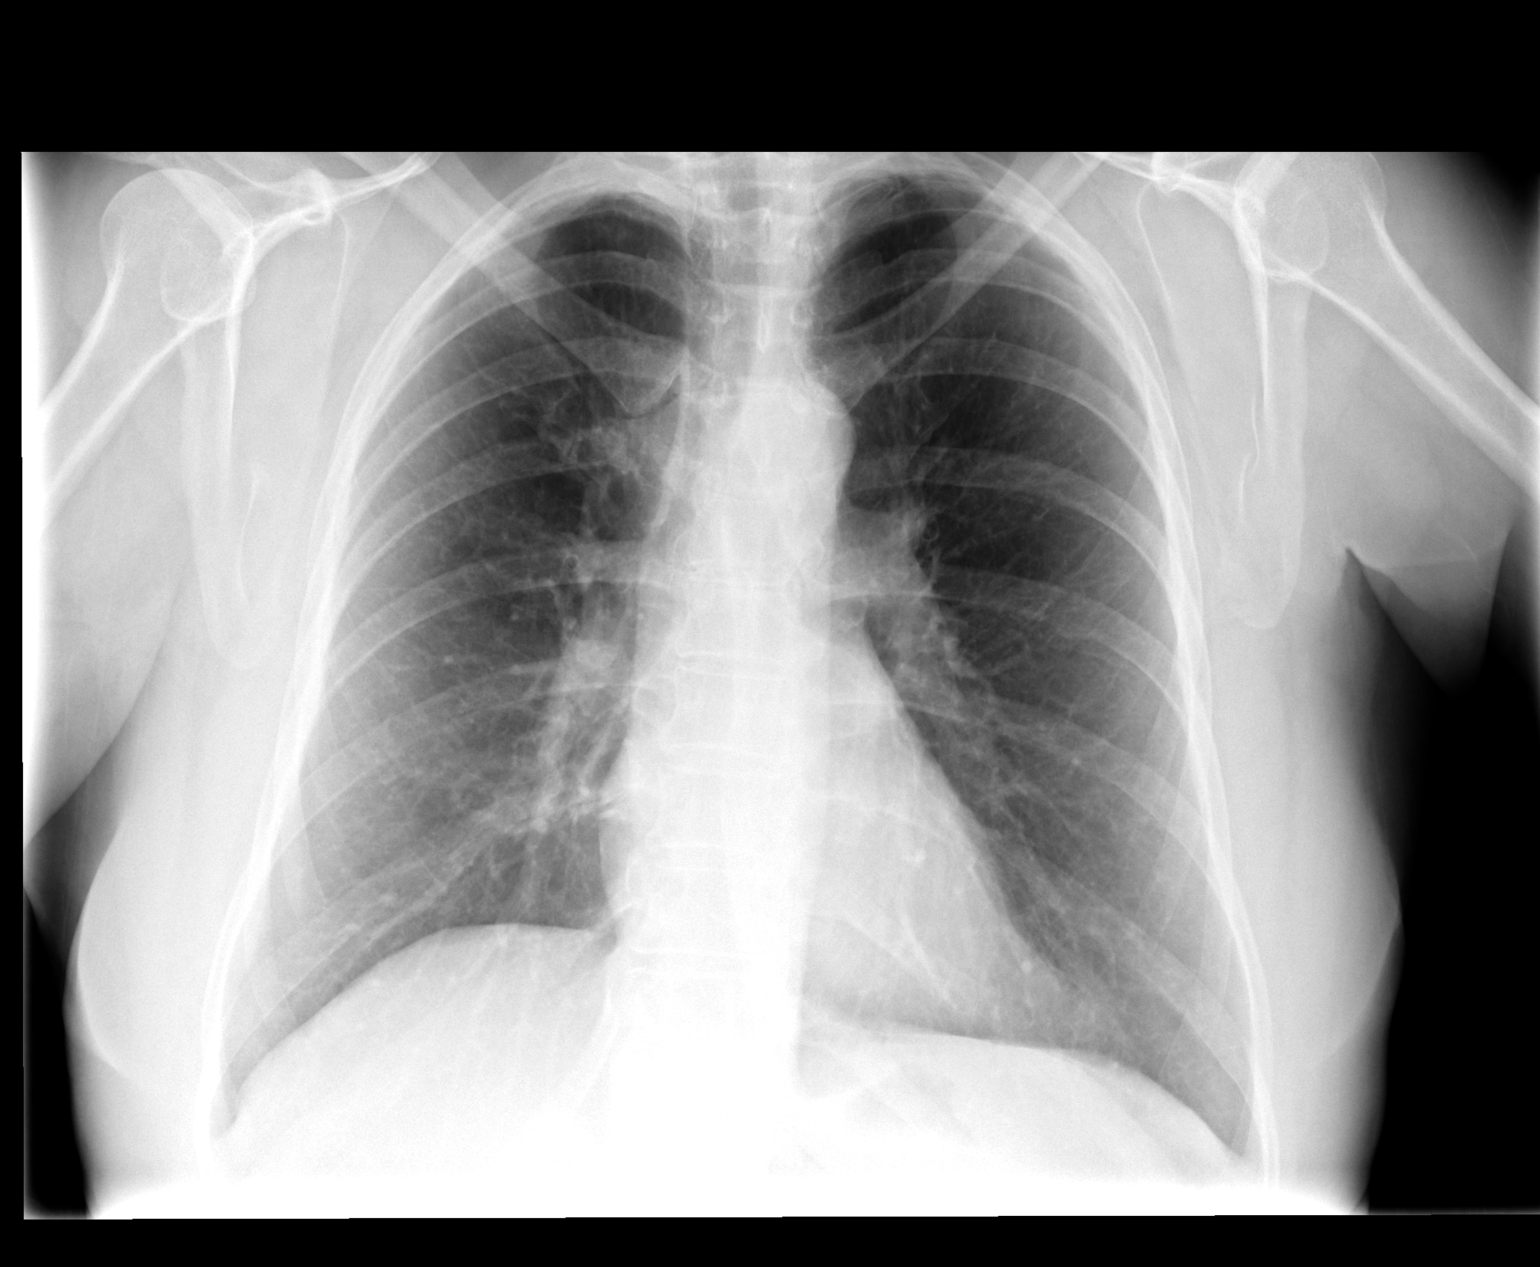

[view not recorded (2 of 2)]
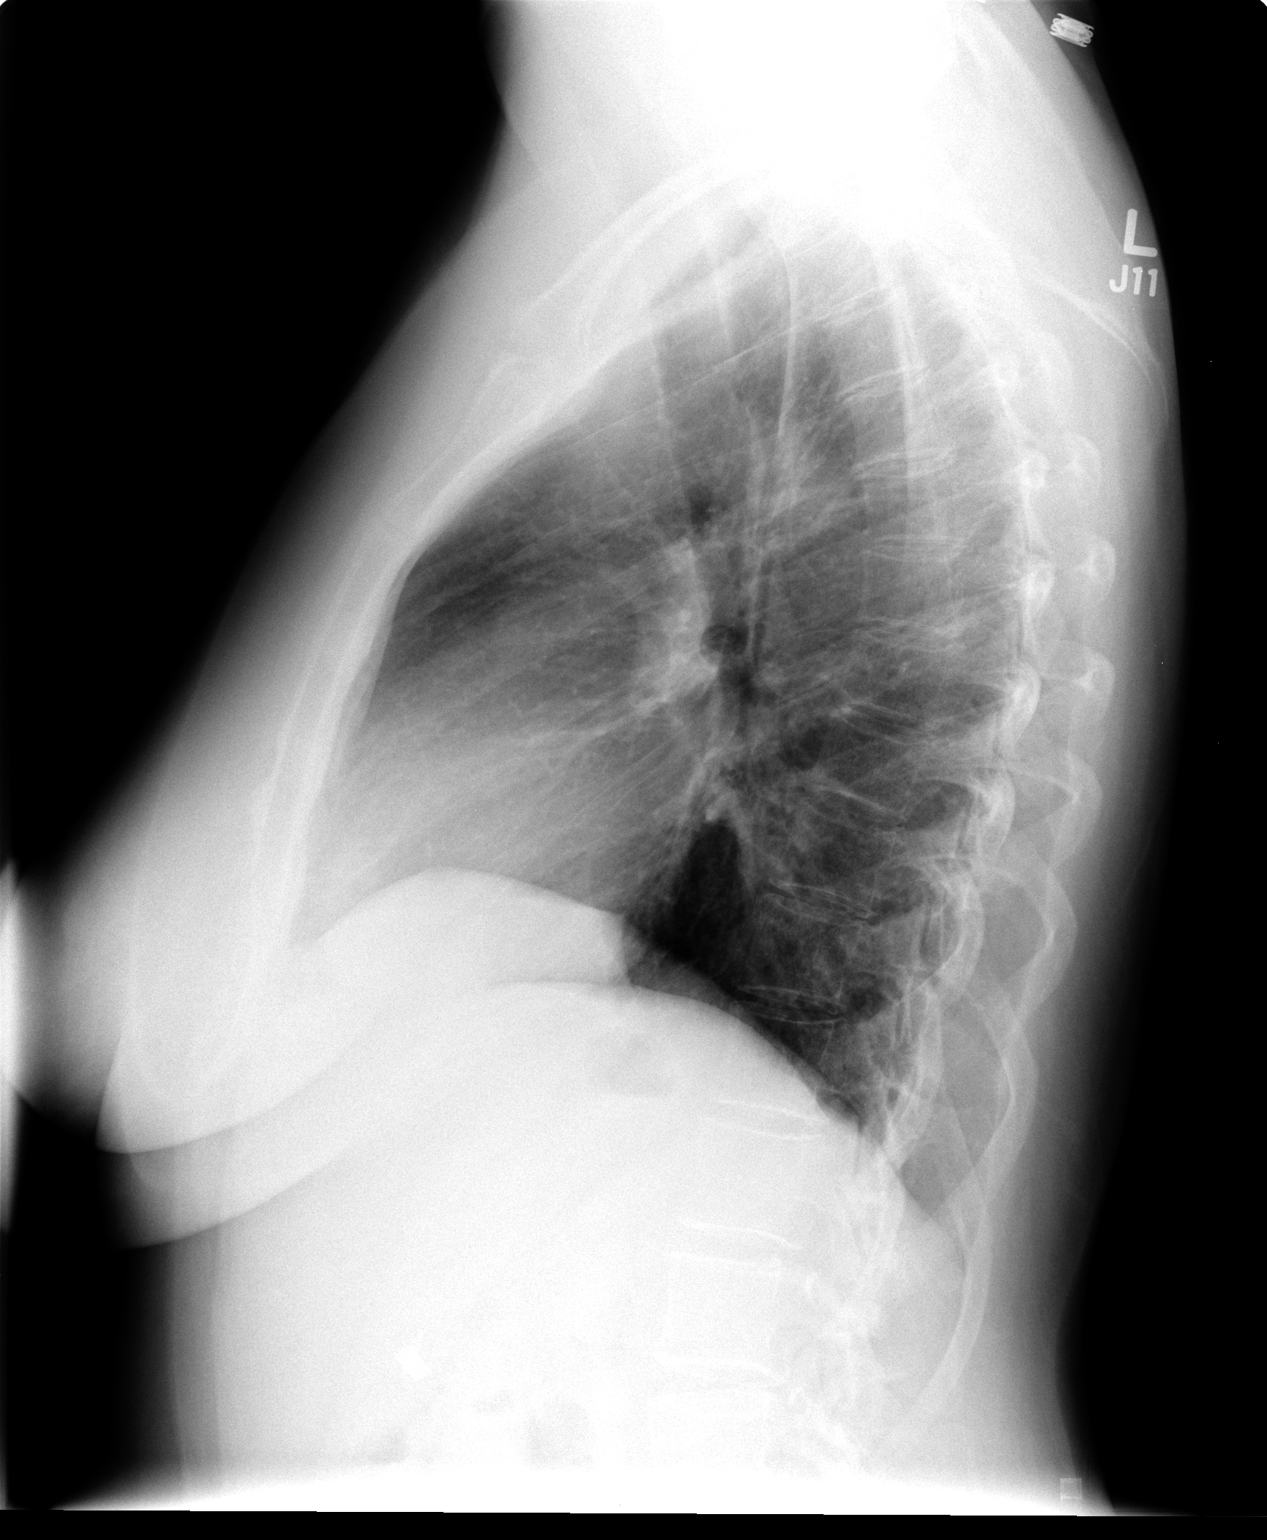

[2 of 2 positions shown; findings below may reference images not displayed]

FINDINGS: Cardiomediastinal silhouette appears normal.  The
pulmonary disease is noted.  Bony thorax is intact.
IMPRESSION: No acute cardiopulmonary abnormality seen.

## 2014-02-21 IMAGING — CR DG CHEST 2V
2 series · 2 of 2 positions shown · non-contrast
Comparison: 08/06/2012 and 03/11/2011.

CLINICAL DATA: Shortness of breath with cough and congestion.
History of diabetes and hypertension.

CHEST - 2 VIEW

[w chest pa]
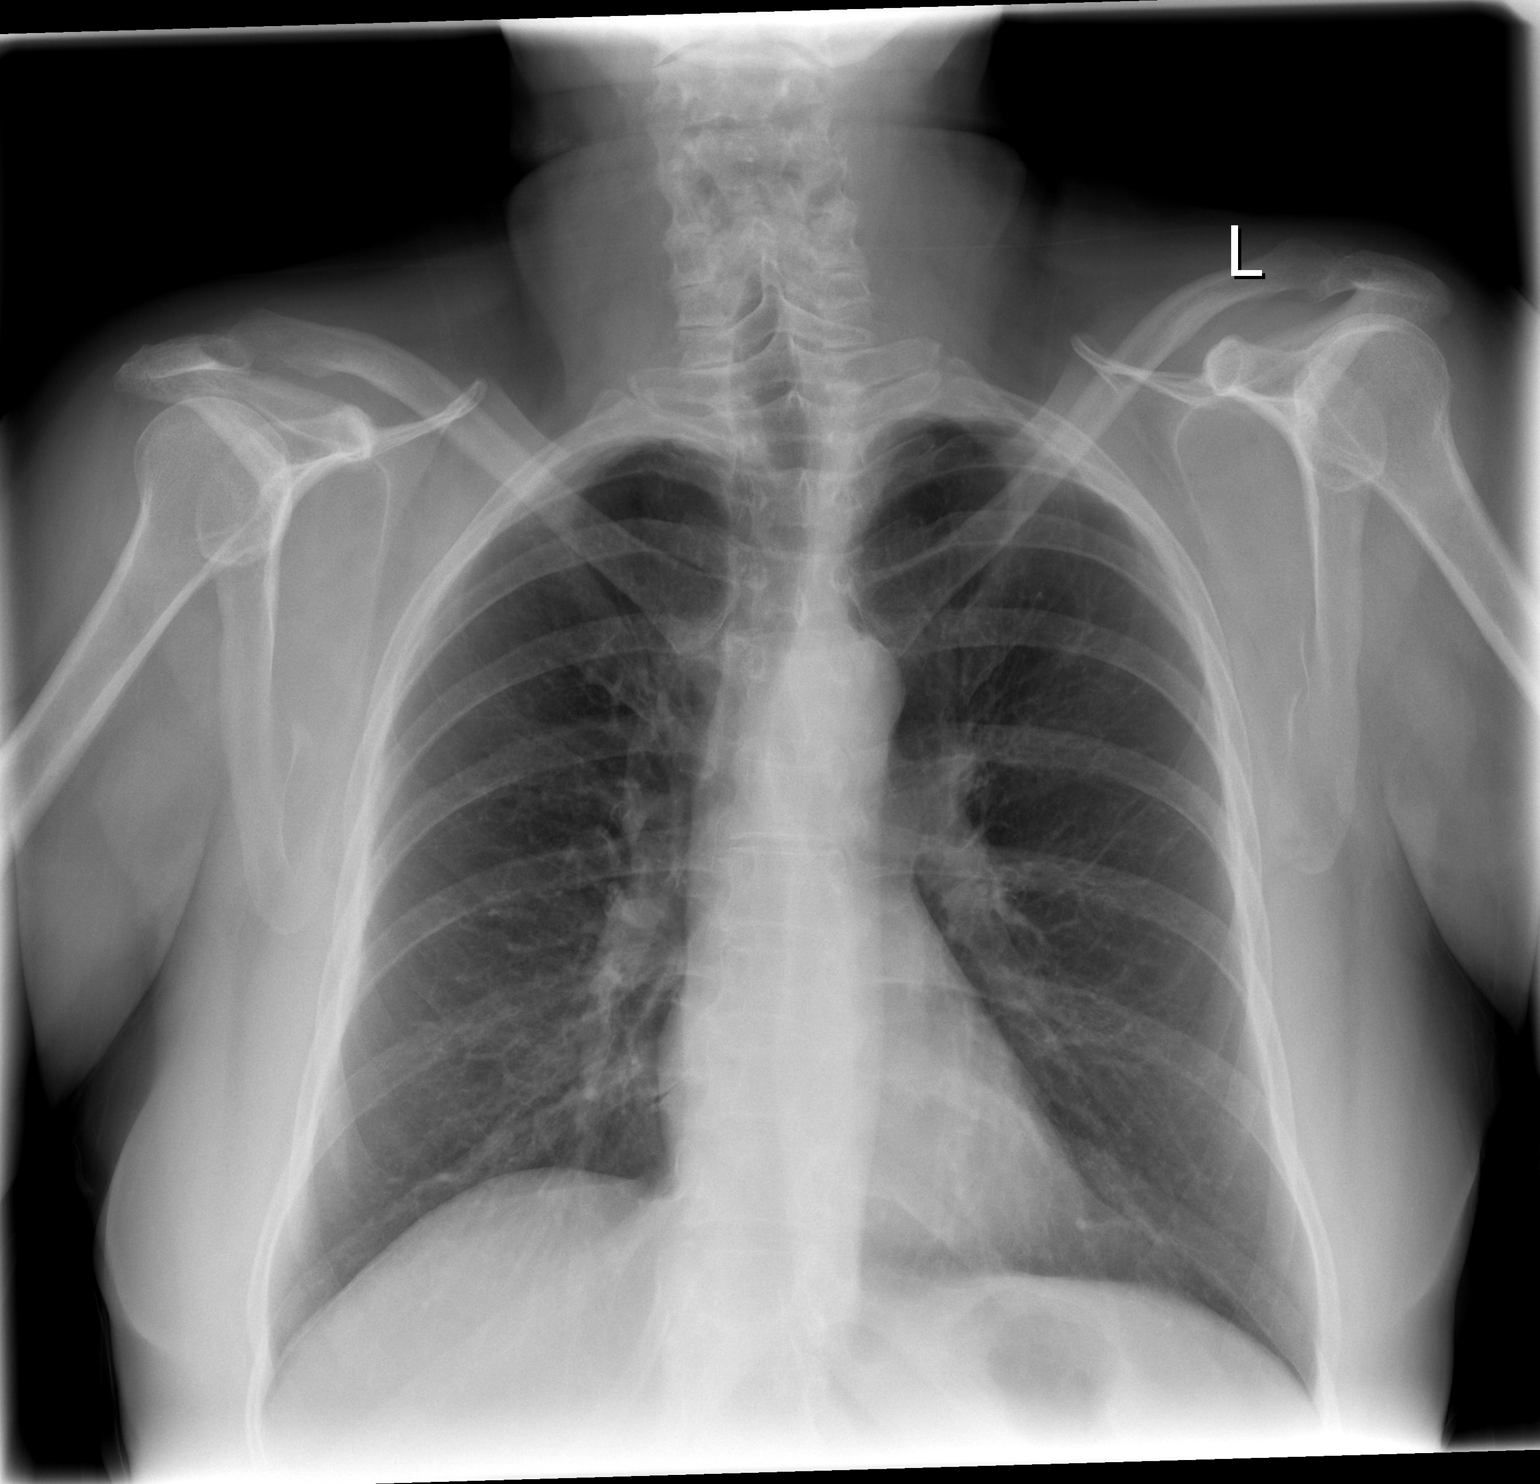

[w chest lat]
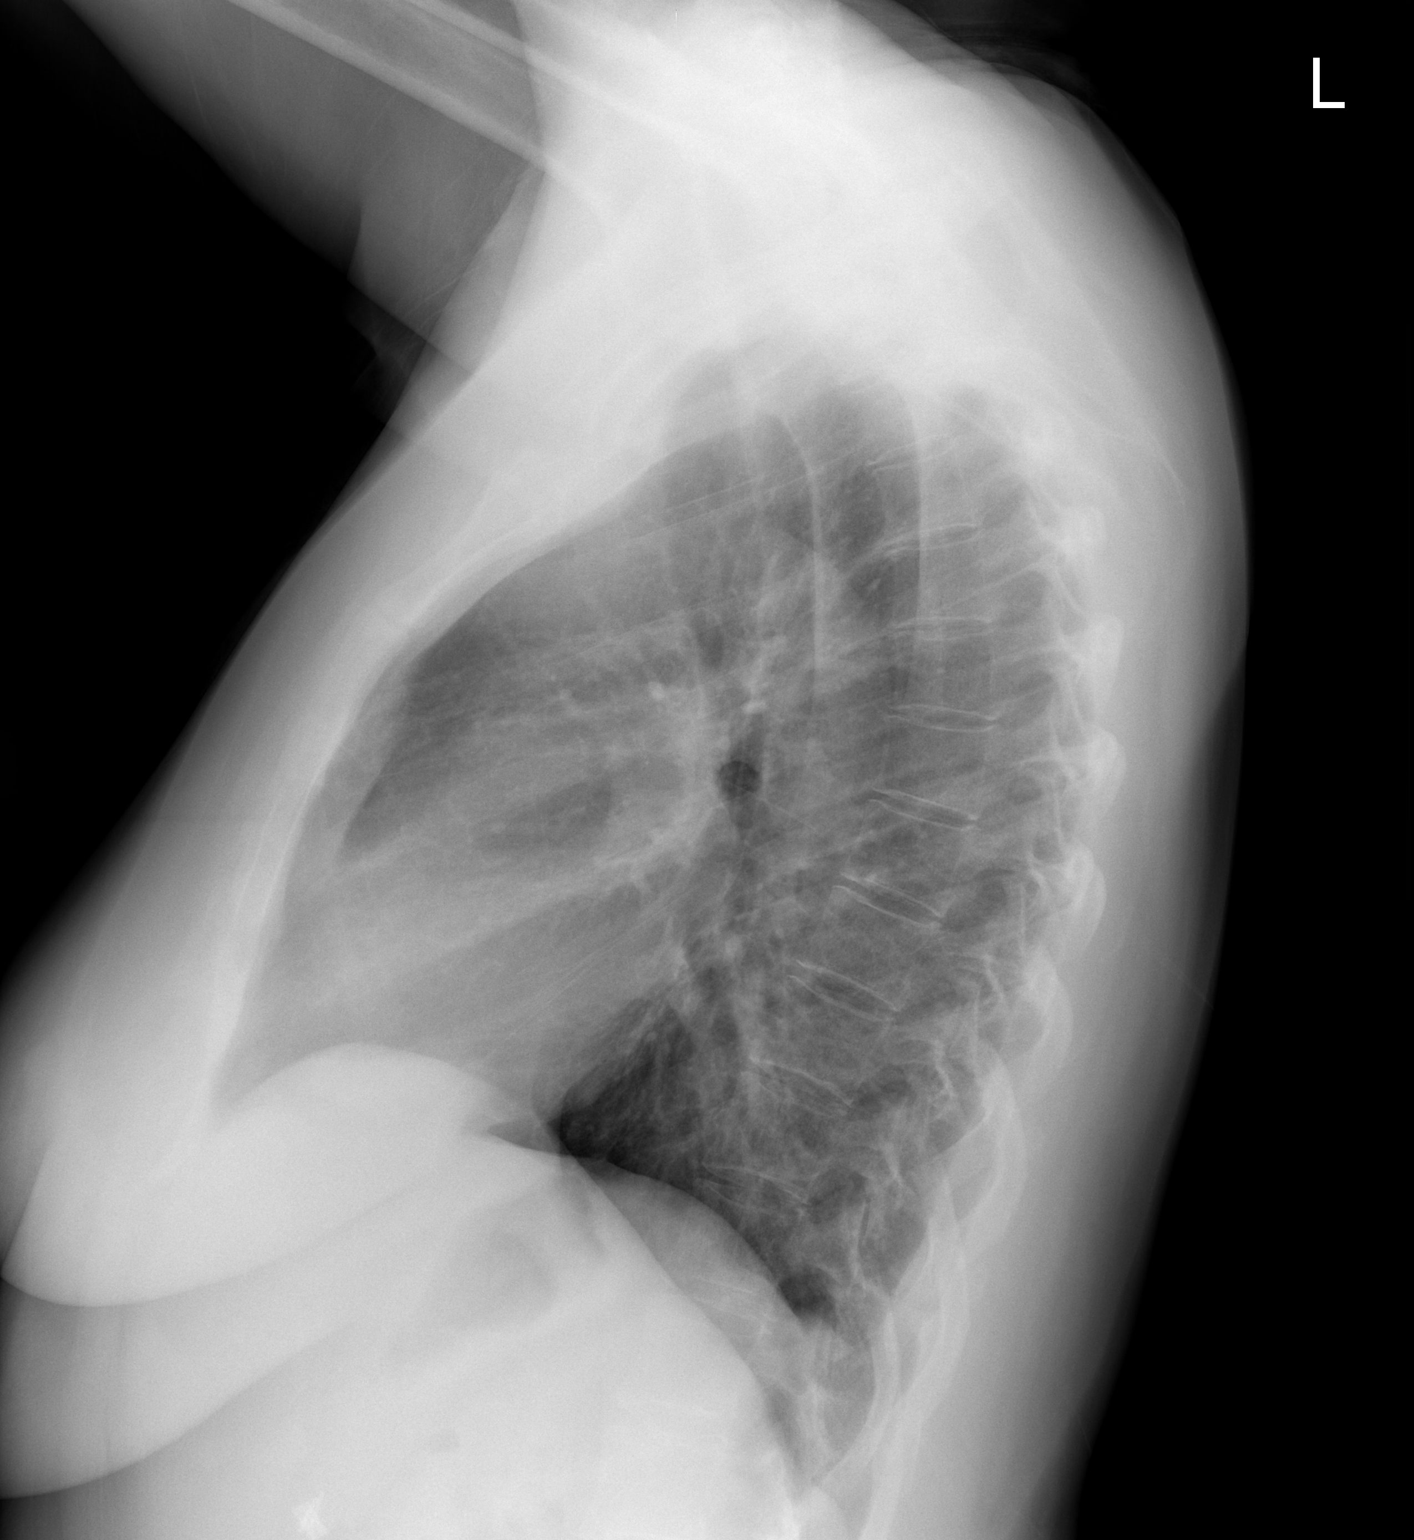

[2 of 2 positions shown; findings below may reference images not displayed]

FINDINGS: The heart size and mediastinal contours are normal. The
lungs are clear. There is no pleural effusion or pneumothorax. No
acute osseous findings are identified.
IMPRESSION: Stable examination.  No active cardiopulmonary process.

## 2014-03-25 ENCOUNTER — Other Ambulatory Visit (HOSPITAL_COMMUNITY): Payer: Self-pay | Admitting: Specialist

## 2014-04-09 ENCOUNTER — Inpatient Hospital Stay (HOSPITAL_COMMUNITY)
Admission: RE | Admit: 2014-04-09 | Discharge: 2014-04-09 | Disposition: A | Discharge status: Home / Self Care | Payer: 59 | Source: Ambulatory Visit

## 2014-04-09 NOTE — H&P (Signed)
Nichole HeinzBecky L [name] is an 53 y.o. female.   Chief Complaint: severe low back pain HPI: Pt  With persistent severe low back pain.  Difficulty with ADLs and usual daily activities at home including twisting, sweeping, mopping or lifting more than 5 pounds. Denies numbness of LEs.  Difficulty walking distance.  Minimal relief with resting. Denies bowel or bladder dysfunction.  CT and post myelogram CT show spondylolisthesis at L4-5 which is reduced on CT in a supine position, but on flexion shows 10mm of forward anterior subluxation of L4 on L5.  This is an unstable segment felt to be the source of her severe back pain. It is felt she would best benefit from a left sided TLIF at L4-5 with posterolateral fusion using pedicle screws and rods, cage and local bone graft.  Risk and benefits of surgery discussed with pt and her family and she wishes to proceed.  Past Medical History  Diagnosis Date  . Gastritis   . Fibromyalgia   . Edema of lower extremity   . DDD (degenerative disc disease)   . MS (multiple sclerosis)     early stages  . Neuropathy   . COPD (chronic obstructive pulmonary disease)   . Hiatal hernia   . Gout   . TIA (transient ischemic attack)     Hx: of  . Asthma     Albuterol prn   . Depression     takes Celexa,Clonazepam daily  . Muscle spasm     takes Flexeril daily as needed  . GERD (gastroesophageal reflux disease)     takes Pepcid daily  . CHF (congestive heart failure)     takes Furosemide daily  . Hypothyroidism     takes Synthroid daily  . HTN (hypertension)     takes lisinopril daily  . Complication of anesthesia     hard to wake up  . PONV (postoperative nausea and vomiting)   . Sleep apnea     sleep study done at least 6660yrs ago;has CPAP but doesn't use  . Hyperlipidemia     but doesn't take any meds  . Phlebitis   . Emphysema lung   . Shortness of breath     with exertion or stressed   . Pneumonia     hx of;last time in 2014  . History of bronchitis    last time 4-735months ago   . Migraine     has one now  . Migraine   . Stroke     left sided weakness  . Joint pain   . Joint swelling   . Chronic back pain     DDD/scoliosis  . Osteoporosis   . Hemorrhoids   . History of colon polyps   . Urinary frequency   . History of kidney stones     is in her left kidney and has been for couple of yrs and no problems  . Diabetes mellitus     borderline  . Insomnia     doesn't take any meds for this    Past Surgical History  Procedure Laterality Date  . Fundic gland polyp      benign  . Sigmoidoscopy  01/31/08    large internal hemorrhoids/small rectal polyp removed/rare sigmoid diverticula  . Esophagogastroduodenoscopy  10/29/07    normal  . Cholecystectomy    . Bladder suspension    . Abdominal hysterectomy    . Carpal tunnel release Right   . Cardiac catheterization    . Colonoscopy    .  Anterior cervical decomp/discectomy fusion N/A 12/30/2013    Procedure: ANTERIOR CERVICAL DISCECTOMY FUSION C4-5, C5-6, plate and screws, allograft, local bone graft;  Surgeon: Kerrin Champagne, MD;  Location: [facility] OR;  Service: Orthopedics;  Laterality: N/A;    Family History  Problem Relation Age of Onset  . Diabetes Mother   . Hypertension Mother   . Arthritis Father   . Diabetes Sister   . Neuropathy Sister   . Transient ischemic attack Sister   . Heart disease Sister   . Hypertension Sister   . Cancer - Lung Other   . Cancer - Prostate Other   . Cancer - Other Other    Social History:  reports that she has been smoking Cigarettes.  She has a 7.5 pack-year smoking history. She has never used smokeless tobacco. She reports that she does not drink alcohol or use illicit drugs.  Allergies:  Allergies  Allergen Reactions  . Sulfa Antibiotics Anaphylaxis  . Demerol [Meperidine] Swelling  . Penicillins Swelling  . Levaquin [Levofloxacin In D5w]     Abdominal pain  . Neurontin [Gabapentin] Other (See Comments)    Abdominal pain  .  Doxycycline Nausea And Vomiting         Medications Prior to Admission  Medication Sig Dispense Refill  . albuterol (PROVENTIL HFA;VENTOLIN HFA) 108 (90 BASE) MCG/ACT inhaler Inhale 2 puffs into the lungs every 6 (six) hours as needed. Shortness of breath.      Marland Kitchen aspirin EC 81 MG tablet Take 81 mg by mouth every other day.      . citalopram (CELEXA) 40 MG tablet Take 40 mg by mouth daily.      . clonazePAM (KLONOPIN) 0.5 MG tablet Take 0.5 mg by mouth 2 (two) times daily.       . famotidine (PEPCID) 20 MG tablet Take 1 tablet (20 mg total) by mouth 2 (two) times daily.      . Fluticasone-Salmeterol (ADVAIR) 250-50 MCG/DOSE AEPB Inhale 1 puff into the lungs 2 (two) times daily as needed (shortness of breath).       . furosemide (LASIX) 20 MG tablet Take 20 mg by mouth daily.      Marland Kitchen levothyroxine (SYNTHROID, LEVOTHROID) 125 MCG tablet Take 125-187.5 mcg by mouth See admin instructions. Take 1 tablet (125 mcg total) on Sun Tue Wed Fri Sat. 1.5 tabs (187.35mcg) on Monday and Thursday      . lisinopril (PRINIVIL,ZESTRIL) 40 MG tablet Take 40 mg by mouth daily.      . mupirocin ointment (BACTROBAN) 2 % Place 1 application into the nose 2 (two) times daily.      Marland Kitchen oxyCODONE-acetaminophen (ROXICET) 5-325 MG per tablet Take 1-2 tablets by mouth every 4 (four) hours as needed for severe pain.  60 tablet  0    Results for orders placed during the hospital encounter of 04/14/14 (from the past 48 hour(s))  GLUCOSE, CAPILLARY     Status: Abnormal   Collection Time    04/14/14  6:13 AM      Result Value Ref Range   Glucose-Capillary 131 (*) 70 - 99 mg/dL   Comment 1 Documented in Chart     Comment 2 Notify RN     No results found.  Review of Systems  Constitutional: Negative.   Eyes: Negative.   Respiratory: Negative.   Cardiovascular: Negative.   Gastrointestinal: Negative.   Genitourinary: Negative.   Musculoskeletal: Positive for back pain and neck pain.  Skin: Negative.   Neurological:  Positive for focal weakness.  Endo/Heme/Allergies: Negative.   Psychiatric/Behavioral: Negative.     Blood pressure 133/68, pulse 84, temperature 98.7 F (37.1 C), resp. rate 18, weight 57.607 kg (127 lb), SpO2 99.00%. Physical Exam  Constitutional: She is oriented to person, place, and time. She appears well-developed and well-nourished.  HENT:  Head: Normocephalic and atraumatic.  Eyes: EOM are normal. Pupils are equal, round, and reactive to light.  Neck:  Painful ROM of the cervical spine.  Well healed anterior neck wound from previous ACDF  Cardiovascular: Normal rate, regular rhythm and normal heart sounds.   Respiratory: Effort normal and breath sounds normal.  GI: Soft. Bowel sounds are normal.  Musculoskeletal:  Painful and limited ROM of lumbar spine.  Negative SLR bilateral.  No focal weakness of LEs  Neurological: She is alert and oriented to person, place, and time.  Skin: Skin is warm and dry.  Psychiatric: She has a normal mood and affect.     Assessment/Plan Painful and unstable spondylolisthesis at L4-5 PLAN:  Left L4-5 TLIF with posterolateral fusion using pedicle screws, rods and cages.  Local bone graft.  Kerrin Champagne 04/14/2014, 7:11 AM

## 2014-04-10 ENCOUNTER — Encounter (HOSPITAL_COMMUNITY): Payer: Self-pay

## 2014-04-10 ENCOUNTER — Encounter (HOSPITAL_COMMUNITY)
Admission: RE | Admit: 2014-04-10 | Discharge: 2014-04-10 | Disposition: A | Discharge status: Home / Self Care | Payer: 59 | Source: Ambulatory Visit | Attending: Specialist | Admitting: Specialist

## 2014-04-10 DIAGNOSIS — Z01812 Encounter for preprocedural laboratory examination: Secondary | ICD-10-CM | POA: Insufficient documentation

## 2014-04-10 LAB — CBC
HCT: 39.2 % (ref 36.0–46.0)
Hemoglobin: 13.8 g/dL (ref 12.0–15.0)
MCH: 31.4 pg (ref 26.0–34.0)
MCHC: 35.2 g/dL (ref 30.0–36.0)
MCV: 89.1 fL (ref 78.0–100.0)
PLATELETS: 275 10*3/uL (ref 150–400)
RBC: 4.4 MIL/uL (ref 3.87–5.11)
RDW: 13.1 % (ref 11.5–15.5)
WBC: 6.2 10*3/uL (ref 4.0–10.5)

## 2014-04-10 LAB — COMPREHENSIVE METABOLIC PANEL
ALBUMIN: 4.1 g/dL (ref 3.5–5.2)
ALT: 15 U/L (ref 0–35)
AST: 15 U/L (ref 0–37)
Alkaline Phosphatase: 81 U/L (ref 39–117)
BUN: 6 mg/dL (ref 6–23)
CALCIUM: 9.9 mg/dL (ref 8.4–10.5)
CO2: 31 mEq/L (ref 19–32)
Chloride: 103 mEq/L (ref 96–112)
Creatinine, Ser: 0.71 mg/dL (ref 0.50–1.10)
GFR calc Af Amer: >90 mL/min (ref >90)
GFR calc non Af Amer: >90 mL/min (ref >90)
Glucose, Bld: 103 mg/dL — ABNORMAL HIGH (ref 70–99)
Potassium: 4.2 mEq/L (ref 3.7–5.3)
Sodium: 142 mEq/L (ref 137–147)
Total Bilirubin: 0.2 mg/dL — ABNORMAL LOW (ref 0.3–1.2)
Total Protein: 7.2 g/dL (ref 6.0–8.3)

## 2014-04-10 LAB — URINALYSIS, ROUTINE W REFLEX MICROSCOPIC
BILIRUBIN URINE: NEGATIVE
Glucose, UA: NEGATIVE mg/dL
Hgb urine dipstick: NEGATIVE
KETONES UR: NEGATIVE mg/dL
Leukocytes, UA: NEGATIVE
NITRITE: NEGATIVE
PROTEIN: NEGATIVE mg/dL
SPECIFIC GRAVITY, URINE: 1.01 (ref 1.005–1.030)
UROBILINOGEN UA: 0.2 mg/dL (ref 0.0–1.0)
pH: 6 (ref 5.0–8.0)

## 2014-04-10 LAB — TYPE AND SCREEN
ABO/RH(D): A NEG
Antibody Screen: NEGATIVE

## 2014-04-10 LAB — ABO/RH: ABO/RH(D): A NEG

## 2014-04-10 LAB — SURGICAL PCR SCREEN
MRSA, PCR: NEGATIVE
Staphylococcus aureus: POSITIVE — AB

## 2014-04-10 NOTE — Progress Notes (Signed)
Pt denies SOB and chest pain. Pt stated that she was under the care of a cardiologist at [facility] but could not remember the cardiologist name. Pt stated that she had an echo and stress test around 7-8 years ago and a cardiac cath around 15 years ago.

## 2014-04-10 NOTE — Pre-Procedure Instructions (Signed)
Nichole HeinzBecky L [name]  04/10/2014   Your procedure is scheduled on:  Monday June 8 th at 0730 AM  [facility] at 0530 AM    Call this number if you have problems the morning of surgery: [phone]   Remember:   Do not eat food or drink liquids after midnight.    Take these medicines the morning of surgery with A SIP OF WATER: Celexa, (Citalopram), Clonazepam (Klonopin), Pepcid, Synthroid, and pain as needed. Used and bring inhalers.   Stop Aspirin, Nsaids( Ibuprofen, Advil, Aleve, Naproxen), Vitamins, and herbal medication as of today.  Do not wear jewelry, make-up or nail polish.  Do not wear lotions, powders, or perfumes. You may wear deodorant.  Do not shave 48 hours prior to surgery.   Do not bring valuables to the hospital.  [facility] belongings or [facility]uables.               Contacts, dentures or bridgework may not be worn into surgery.  Leave suitcase in the car. After surgery it may be brought to your room.  For patients admitted to the hospital, discharge time is determined by your treatment team.               Patients discharged the day of surgery will not be allowed to drive home.    Special Instructions: [name] - Preparing for Surgery  Before surgery, you can play an important role.  Because skin is not sterile, your skin needs to be as free of germs as possible.  You can reduce the number of germs on you skin by washing with CHG (chlorahexidine gluconate) soap before surgery.  CHG is an antiseptic cleaner which kills germs and bonds with the skin to continue killing germs even after washing.  Please DO NOT use if you have an allergy to CHG or antibacterial soaps.  If your skin becomes reddened/irritated stop using the CHG and inform your nurse when you arrive at Short Stay.  Do not shave (including legs and underarms) for at least 48 hours prior to the first CHG shower.  You may shave your face.  Please follow  these instructions carefully:   1.  Shower with CHG Soap the night before surgery and the                                morning of Surgery.  2.  If you choose to wash your hair, wash your hair first as usual with your       normal shampoo.  3.  After you shampoo, rinse your hair and body thoroughly to remove the                      Shampoo.  4.  Use CHG as you would any other liquid soap.  You can apply chg directly       to the skin and wash gently with scrungie or a clean washcloth.  5.  Apply the CHG Soap to your body ONLY FROM THE NECK DOWN.        Do not use on open wounds or open sores.  Avoid contact with your eyes,       ears, mouth and genitals (private parts).  Wash genitals (private parts)       with your normal soap.  6.  Wash thoroughly, paying special attention  to the area where your surgery        will be performed.  7.  Thoroughly rinse your body with warm water from the neck down.  8.  DO NOT shower/wash with your normal soap after using and rinsing off       the CHG Soap.  9.  Pat yourself dry with a clean towel.            10.  Wear clean pajamas.            11.  Place clean sheets on your bed the night of your first shower and do not        sleep with pets.  Day of Surgery  Do not apply any lotions/deoderants the morning of surgery.  Please wear clean clothes to the hospital/surgery center.      Please read over the following fact sheets that you were given: Pain Booklet, Coughing and Deep Breathing, Blood Transfusion Information, MRSA Information and Surgical Site Infection Prevention

## 2014-04-13 MED ORDER — CLINDAMYCIN PHOSPHATE 900 MG/50ML IV SOLN
900.0000 mg | INTRAVENOUS | Status: AC
Start: 2014-04-14 — End: 2014-04-14
  Administered 2014-04-14: 900 mg via INTRAVENOUS
  Filled 2014-04-13: qty 50

## 2014-04-13 MED ORDER — VANCOMYCIN HCL IN DEXTROSE 1-5 GM/200ML-% IV SOLN
1000.0000 mg | INTRAVENOUS | Status: DC
  Filled 2014-04-13: qty 200

## 2014-04-14 ENCOUNTER — Encounter (HOSPITAL_COMMUNITY)
Admission: RE | Disposition: A | Discharge status: Home Health | Payer: Self-pay | Source: Ambulatory Visit | Attending: Specialist

## 2014-04-14 ENCOUNTER — Inpatient Hospital Stay (HOSPITAL_COMMUNITY): Payer: 59 | Admitting: Anesthesiology

## 2014-04-14 ENCOUNTER — Inpatient Hospital Stay (HOSPITAL_COMMUNITY)
Admission: RE | Admit: 2014-04-14 | Discharge: 2014-04-18 | DRG: 460 | Disposition: A | Discharge status: Home Health | Payer: 59 | Source: Ambulatory Visit | Attending: Specialist | Admitting: Specialist

## 2014-04-14 ENCOUNTER — Encounter (HOSPITAL_COMMUNITY): Payer: 59 | Admitting: Anesthesiology

## 2014-04-14 ENCOUNTER — Encounter (HOSPITAL_COMMUNITY): Payer: Self-pay | Admitting: *Deleted

## 2014-04-14 ENCOUNTER — Inpatient Hospital Stay (HOSPITAL_COMMUNITY): Payer: 59

## 2014-04-14 DIAGNOSIS — E440 Moderate protein-calorie malnutrition: Secondary | ICD-10-CM | POA: Insufficient documentation

## 2014-04-14 DIAGNOSIS — Z880 Allergy status to penicillin: Secondary | ICD-10-CM

## 2014-04-14 DIAGNOSIS — G4730 Sleep apnea, unspecified: Secondary | ICD-10-CM

## 2014-04-14 DIAGNOSIS — K2990 Gastroduodenitis, unspecified, without bleeding: Secondary | ICD-10-CM

## 2014-04-14 DIAGNOSIS — Z888 Allergy status to other drugs, medicaments and biological substances status: Secondary | ICD-10-CM

## 2014-04-14 DIAGNOSIS — E46 Unspecified protein-calorie malnutrition: Secondary | ICD-10-CM | POA: Diagnosis present

## 2014-04-14 DIAGNOSIS — E119 Type 2 diabetes mellitus without complications: Secondary | ICD-10-CM | POA: Diagnosis present

## 2014-04-14 DIAGNOSIS — Q762 Congenital spondylolisthesis: Principal | ICD-10-CM

## 2014-04-14 DIAGNOSIS — M4040 Postural lordosis, site unspecified: Secondary | ICD-10-CM | POA: Diagnosis present

## 2014-04-14 DIAGNOSIS — IMO0001 Reserved for inherently not codable concepts without codable children: Secondary | ICD-10-CM | POA: Diagnosis present

## 2014-04-14 DIAGNOSIS — F17200 Nicotine dependence, unspecified, uncomplicated: Secondary | ICD-10-CM | POA: Diagnosis present

## 2014-04-14 DIAGNOSIS — Z79899 Other long term (current) drug therapy: Secondary | ICD-10-CM

## 2014-04-14 DIAGNOSIS — Z7982 Long term (current) use of aspirin: Secondary | ICD-10-CM

## 2014-04-14 DIAGNOSIS — M48062 Spinal stenosis, lumbar region with neurogenic claudication: Secondary | ICD-10-CM | POA: Diagnosis present

## 2014-04-14 DIAGNOSIS — G8929 Other chronic pain: Secondary | ICD-10-CM | POA: Diagnosis present

## 2014-04-14 DIAGNOSIS — F329 Major depressive disorder, single episode, unspecified: Secondary | ICD-10-CM | POA: Diagnosis present

## 2014-04-14 DIAGNOSIS — E785 Hyperlipidemia, unspecified: Secondary | ICD-10-CM | POA: Diagnosis present

## 2014-04-14 DIAGNOSIS — F3289 Other specified depressive episodes: Secondary | ICD-10-CM | POA: Diagnosis present

## 2014-04-14 DIAGNOSIS — G43909 Migraine, unspecified, not intractable, without status migrainosus: Secondary | ICD-10-CM | POA: Diagnosis present

## 2014-04-14 DIAGNOSIS — E039 Hypothyroidism, unspecified: Secondary | ICD-10-CM | POA: Diagnosis present

## 2014-04-14 DIAGNOSIS — Z981 Arthrodesis status: Secondary | ICD-10-CM

## 2014-04-14 DIAGNOSIS — G35 Multiple sclerosis: Secondary | ICD-10-CM | POA: Diagnosis present

## 2014-04-14 DIAGNOSIS — M62838 Other muscle spasm: Secondary | ICD-10-CM | POA: Diagnosis present

## 2014-04-14 DIAGNOSIS — I10 Essential (primary) hypertension: Secondary | ICD-10-CM | POA: Diagnosis present

## 2014-04-14 DIAGNOSIS — G589 Mononeuropathy, unspecified: Secondary | ICD-10-CM | POA: Diagnosis present

## 2014-04-14 DIAGNOSIS — J438 Other emphysema: Secondary | ICD-10-CM | POA: Diagnosis present

## 2014-04-14 DIAGNOSIS — M4316 Spondylolisthesis, lumbar region: Secondary | ICD-10-CM

## 2014-04-14 DIAGNOSIS — K219 Gastro-esophageal reflux disease without esophagitis: Secondary | ICD-10-CM | POA: Diagnosis present

## 2014-04-14 DIAGNOSIS — G4700 Insomnia, unspecified: Secondary | ICD-10-CM | POA: Diagnosis present

## 2014-04-14 DIAGNOSIS — J45909 Unspecified asthma, uncomplicated: Secondary | ICD-10-CM | POA: Diagnosis present

## 2014-04-14 DIAGNOSIS — IMO0002 Reserved for concepts with insufficient information to code with codable children: Secondary | ICD-10-CM | POA: Diagnosis present

## 2014-04-14 DIAGNOSIS — I509 Heart failure, unspecified: Secondary | ICD-10-CM | POA: Diagnosis present

## 2014-04-14 DIAGNOSIS — M109 Gout, unspecified: Secondary | ICD-10-CM | POA: Diagnosis present

## 2014-04-14 DIAGNOSIS — Z8673 Personal history of transient ischemic attack (TIA), and cerebral infarction without residual deficits: Secondary | ICD-10-CM

## 2014-04-14 DIAGNOSIS — D62 Acute posthemorrhagic anemia: Secondary | ICD-10-CM | POA: Diagnosis not present

## 2014-04-14 DIAGNOSIS — K2970 Gastritis, unspecified, without bleeding: Secondary | ICD-10-CM | POA: Diagnosis present

## 2014-04-14 LAB — GLUCOSE, CAPILLARY: GLUCOSE-CAPILLARY: 131 mg/dL — AB (ref 70–99)

## 2014-04-14 SURGERY — POSTERIOR LUMBAR FUSION 1 LEVEL
Anesthesia: General | Site: Spine Lumbar

## 2014-04-14 MED ORDER — METOCLOPRAMIDE HCL 5 MG/ML IJ SOLN
10.0000 mg | Freq: Once | INTRAMUSCULAR | Status: AC | PRN
  Administered 2014-04-14: 10 mg via INTRAVENOUS

## 2014-04-14 MED ORDER — METHOCARBAMOL 500 MG PO TABS
500.0000 mg | ORAL_TABLET | Freq: Four times a day (QID) | ORAL | Status: DC | PRN
  Administered 2014-04-14 – 2014-04-18 (×13): 500 mg via ORAL
  Filled 2014-04-14 (×13): qty 1

## 2014-04-14 MED ORDER — ONDANSETRON HCL 4 MG/2ML IJ SOLN
4.0000 mg | INTRAMUSCULAR | Status: DC | PRN
  Administered 2014-04-15: 4 mg via INTRAVENOUS
  Filled 2014-04-14: qty 2

## 2014-04-14 MED ORDER — STERILE WATER FOR INJECTION IJ SOLN
INTRAMUSCULAR | Status: AC
  Filled 2014-04-14: qty 10

## 2014-04-14 MED ORDER — LISINOPRIL 40 MG PO TABS
40.0000 mg | ORAL_TABLET | Freq: Every day | ORAL | Status: DC
  Administered 2014-04-17 – 2014-04-18 (×2): 40 mg via ORAL
  Filled 2014-04-14 (×5): qty 1

## 2014-04-14 MED ORDER — SODIUM CHLORIDE 0.9 % IJ SOLN
3.0000 mL | Freq: Two times a day (BID) | INTRAMUSCULAR | Status: DC
  Administered 2014-04-15 – 2014-04-16 (×3): 3 mL via INTRAVENOUS

## 2014-04-14 MED ORDER — FENTANYL CITRATE 0.05 MG/ML IJ SOLN
INTRAMUSCULAR | Status: AC
  Filled 2014-04-14: qty 5

## 2014-04-14 MED ORDER — BUPIVACAINE LIPOSOME 1.3 % IJ SUSP
20.0000 mL | Freq: Once | INTRAMUSCULAR | Status: DC
  Filled 2014-04-14: qty 20

## 2014-04-14 MED ORDER — VECURONIUM BROMIDE 10 MG IV SOLR
INTRAVENOUS | Status: AC
  Filled 2014-04-14: qty 10

## 2014-04-14 MED ORDER — ARTIFICIAL TEARS OP OINT
TOPICAL_OINTMENT | OPHTHALMIC | Status: AC
  Filled 2014-04-14: qty 3.5

## 2014-04-14 MED ORDER — KETOROLAC TROMETHAMINE 30 MG/ML IJ SOLN
INTRAMUSCULAR | Status: AC
  Filled 2014-04-14: qty 1

## 2014-04-14 MED ORDER — GLYCOPYRROLATE 0.2 MG/ML IJ SOLN
INTRAMUSCULAR | Status: DC | PRN
  Administered 2014-04-14: 0.4 mg via INTRAVENOUS

## 2014-04-14 MED ORDER — MUPIROCIN 2 % EX OINT
1.0000 "application " | TOPICAL_OINTMENT | Freq: Two times a day (BID) | CUTANEOUS | Status: DC
  Administered 2014-04-15 – 2014-04-18 (×8): 1 via NASAL
  Filled 2014-04-14 (×2): qty 22

## 2014-04-14 MED ORDER — LACTATED RINGERS IV SOLN
INTRAVENOUS | Status: DC | PRN
  Administered 2014-04-14 (×3): via INTRAVENOUS

## 2014-04-14 MED ORDER — THROMBIN 20000 UNITS EX SOLR
CUTANEOUS | Status: AC
  Filled 2014-04-14: qty 20000

## 2014-04-14 MED ORDER — PROPOFOL 10 MG/ML IV BOLUS
INTRAVENOUS | Status: DC | PRN
  Administered 2014-04-14 (×2): 30 mg via INTRAVENOUS
  Administered 2014-04-14: 200 mg via INTRAVENOUS
  Administered 2014-04-14: 30 mg via INTRAVENOUS

## 2014-04-14 MED ORDER — DEXAMETHASONE SODIUM PHOSPHATE 4 MG/ML IJ SOLN
INTRAMUSCULAR | Status: DC | PRN
  Administered 2014-04-14: 8 mg via INTRAVENOUS

## 2014-04-14 MED ORDER — PANTOPRAZOLE SODIUM 40 MG PO TBEC
40.0000 mg | DELAYED_RELEASE_TABLET | Freq: Every day | ORAL | Status: DC
  Administered 2014-04-14 – 2014-04-16 (×3): 40 mg via ORAL
  Filled 2014-04-14 (×3): qty 1

## 2014-04-14 MED ORDER — LIDOCAINE HCL (CARDIAC) 20 MG/ML IV SOLN
INTRAVENOUS | Status: AC
  Filled 2014-04-14: qty 5

## 2014-04-14 MED ORDER — ALUM & MAG HYDROXIDE-SIMETH 200-200-20 MG/5ML PO SUSP: 30.0000 mL | Freq: Four times a day (QID) | ORAL | Status: DC | PRN

## 2014-04-14 MED ORDER — KETOROLAC TROMETHAMINE 30 MG/ML IJ SOLN
30.0000 mg | Freq: Once | INTRAMUSCULAR | Status: AC
  Administered 2014-04-14: 30 mg via INTRAVENOUS

## 2014-04-14 MED ORDER — METOCLOPRAMIDE HCL 5 MG/ML IJ SOLN
INTRAMUSCULAR | Status: AC
  Filled 2014-04-14: qty 2

## 2014-04-14 MED ORDER — ROCURONIUM BROMIDE 100 MG/10ML IV SOLN
INTRAVENOUS | Status: DC | PRN
  Administered 2014-04-14: 50 mg via INTRAVENOUS

## 2014-04-14 MED ORDER — CITALOPRAM HYDROBROMIDE 40 MG PO TABS
40.0000 mg | ORAL_TABLET | Freq: Every day | ORAL | Status: DC
  Administered 2014-04-14 – 2014-04-18 (×5): 40 mg via ORAL
  Filled 2014-04-14 (×6): qty 1

## 2014-04-14 MED ORDER — ASPIRIN EC 81 MG PO TBEC
81.0000 mg | DELAYED_RELEASE_TABLET | ORAL | Status: DC
  Administered 2014-04-15 – 2014-04-17 (×2): 81 mg via ORAL
  Filled 2014-04-14 (×2): qty 1

## 2014-04-14 MED ORDER — MOMETASONE FURO-FORMOTEROL FUM 100-5 MCG/ACT IN AERO
2.0000 | INHALATION_SPRAY | Freq: Two times a day (BID) | RESPIRATORY_TRACT | Status: DC
  Administered 2014-04-14 – 2014-04-18 (×8): 2 via RESPIRATORY_TRACT
  Filled 2014-04-14 (×2): qty 8.8

## 2014-04-14 MED ORDER — ARTIFICIAL TEARS OP OINT
TOPICAL_OINTMENT | OPHTHALMIC | Status: DC | PRN
  Administered 2014-04-14: 1 via OPHTHALMIC

## 2014-04-14 MED ORDER — CLONAZEPAM 0.5 MG PO TABS
0.5000 mg | ORAL_TABLET | Freq: Two times a day (BID) | ORAL | Status: DC
  Administered 2014-04-14 – 2014-04-18 (×8): 0.5 mg via ORAL
  Filled 2014-04-14 (×8): qty 1

## 2014-04-14 MED ORDER — PROPOFOL 10 MG/ML IV BOLUS
INTRAVENOUS | Status: AC
  Filled 2014-04-14: qty 20

## 2014-04-14 MED ORDER — ONDANSETRON HCL 4 MG/2ML IJ SOLN
INTRAMUSCULAR | Status: AC
  Filled 2014-04-14: qty 2

## 2014-04-14 MED ORDER — ALBUMIN HUMAN 5 % IV SOLN
INTRAVENOUS | Status: DC | PRN
  Administered 2014-04-14: 11:00:00 via INTRAVENOUS

## 2014-04-14 MED ORDER — 0.9 % SODIUM CHLORIDE (POUR BTL) OPTIME
TOPICAL | Status: DC | PRN
  Administered 2014-04-14: 1000 mL

## 2014-04-14 MED ORDER — VECURONIUM BROMIDE 10 MG IV SOLR
INTRAVENOUS | Status: DC | PRN
  Administered 2014-04-14: 2 mg via INTRAVENOUS

## 2014-04-14 MED ORDER — ALBUTEROL SULFATE (2.5 MG/3ML) 0.083% IN NEBU
3.0000 mL | INHALATION_SOLUTION | Freq: Four times a day (QID) | RESPIRATORY_TRACT | Status: DC | PRN
Start: 2014-04-14 — End: 2014-04-18

## 2014-04-14 MED ORDER — OXYCODONE-ACETAMINOPHEN 5-325 MG PO TABS
1.0000 | ORAL_TABLET | ORAL | Status: DC | PRN
  Administered 2014-04-14 (×3): 2 via ORAL
  Administered 2014-04-15: 1 via ORAL
  Administered 2014-04-15 (×3): 2 via ORAL
  Filled 2014-04-14 (×6): qty 2

## 2014-04-14 MED ORDER — CHLORHEXIDINE GLUCONATE 4 % EX LIQD
60.0000 mL | Freq: Once | CUTANEOUS | Status: DC
Start: 2014-04-14 — End: 2014-04-14
  Filled 2014-04-14: qty 60

## 2014-04-14 MED ORDER — LEVOTHYROXINE SODIUM 125 MCG PO TABS
125.0000 ug | ORAL_TABLET | ORAL | Status: DC
  Administered 2014-04-15 – 2014-04-18 (×3): 125 ug via ORAL
  Filled 2014-04-14 (×4): qty 1

## 2014-04-14 MED ORDER — METHOCARBAMOL 500 MG PO TABS
ORAL_TABLET | ORAL | Status: AC
Start: 2014-04-14 — End: 2014-04-14
  Administered 2014-04-14: 500 mg via ORAL
  Filled 2014-04-14: qty 1

## 2014-04-14 MED ORDER — MENTHOL 3 MG MT LOZG: 1.0000 | LOZENGE | OROMUCOSAL | Status: DC | PRN

## 2014-04-14 MED ORDER — GLYCOPYRROLATE 0.2 MG/ML IJ SOLN
INTRAMUSCULAR | Status: AC
  Filled 2014-04-14: qty 1

## 2014-04-14 MED ORDER — METOCLOPRAMIDE HCL 5 MG/ML IJ SOLN
INTRAMUSCULAR | Status: DC | PRN
  Administered 2014-04-14: 10 mg via INTRAVENOUS

## 2014-04-14 MED ORDER — CHLORHEXIDINE GLUCONATE 4 % EX LIQD
60.0000 mL | Freq: Once | CUTANEOUS | Status: DC
  Filled 2014-04-14: qty 60

## 2014-04-14 MED ORDER — SODIUM CHLORIDE 0.9 % IJ SOLN
INTRAMUSCULAR | Status: AC
  Filled 2014-04-14: qty 10

## 2014-04-14 MED ORDER — ONDANSETRON HCL 4 MG/2ML IJ SOLN
INTRAMUSCULAR | Status: DC | PRN
Start: 2014-04-14 — End: 2014-04-14
  Administered 2014-04-14: 4 mg via INTRAVENOUS

## 2014-04-14 MED ORDER — FUROSEMIDE 20 MG PO TABS
20.0000 mg | ORAL_TABLET | Freq: Every day | ORAL | Status: DC
  Administered 2014-04-14 – 2014-04-18 (×5): 20 mg via ORAL
  Filled 2014-04-14 (×5): qty 1

## 2014-04-14 MED ORDER — ACETAMINOPHEN 325 MG PO TABS: 650.0000 mg | ORAL_TABLET | ORAL | Status: DC | PRN

## 2014-04-14 MED ORDER — OXYCODONE HCL 5 MG PO TABS: 5.0000 mg | ORAL_TABLET | Freq: Once | ORAL | Status: DC | PRN

## 2014-04-14 MED ORDER — PHENOL 1.4 % MT LIQD: 1.0000 | OROMUCOSAL | Status: DC | PRN

## 2014-04-14 MED ORDER — NEOSTIGMINE METHYLSULFATE 10 MG/10ML IV SOLN
INTRAVENOUS | Status: AC
  Filled 2014-04-14: qty 1

## 2014-04-14 MED ORDER — METHOCARBAMOL 1000 MG/10ML IJ SOLN
500.0000 mg | Freq: Four times a day (QID) | INTRAVENOUS | Status: DC | PRN
  Filled 2014-04-14: qty 5

## 2014-04-14 MED ORDER — MORPHINE SULFATE 2 MG/ML IJ SOLN
1.0000 mg | INTRAMUSCULAR | Status: DC | PRN
  Administered 2014-04-14 (×3): 2 mg via INTRAVENOUS
  Administered 2014-04-15: 4 mg via INTRAVENOUS
  Administered 2014-04-15: 2 mg via INTRAVENOUS
  Administered 2014-04-15 (×3): 4 mg via INTRAVENOUS
  Administered 2014-04-15: 2 mg via INTRAVENOUS
  Administered 2014-04-15: 4 mg via INTRAVENOUS
  Administered 2014-04-16: 2 mg via INTRAVENOUS
  Administered 2014-04-16: 4 mg via INTRAVENOUS
  Administered 2014-04-16 (×6): 2 mg via INTRAVENOUS
  Administered 2014-04-17: 4 mg via INTRAVENOUS
  Administered 2014-04-17: 2 mg via INTRAVENOUS
  Administered 2014-04-17 (×2): 4 mg via INTRAVENOUS
  Administered 2014-04-17: 2 mg via INTRAVENOUS
  Administered 2014-04-17: 4 mg via INTRAVENOUS
  Administered 2014-04-17: 2 mg via INTRAVENOUS
  Filled 2014-04-14: qty 2
  Filled 2014-04-14: qty 1
  Filled 2014-04-14: qty 2
  Filled 2014-04-14 (×2): qty 1
  Filled 2014-04-14: qty 2
  Filled 2014-04-14: qty 1
  Filled 2014-04-14: qty 2
  Filled 2014-04-14 (×3): qty 1
  Filled 2014-04-14: qty 2
  Filled 2014-04-14: qty 1
  Filled 2014-04-14: qty 2
  Filled 2014-04-14 (×4): qty 1
  Filled 2014-04-14: qty 2
  Filled 2014-04-14 (×2): qty 1
  Filled 2014-04-14: qty 2
  Filled 2014-04-14 (×6): qty 1

## 2014-04-14 MED ORDER — OXYCODONE-ACETAMINOPHEN 5-325 MG PO TABS
ORAL_TABLET | ORAL | Status: AC
  Administered 2014-04-14: 2 via ORAL
  Filled 2014-04-14: qty 2

## 2014-04-14 MED ORDER — FENTANYL CITRATE 0.05 MG/ML IJ SOLN
INTRAMUSCULAR | Status: DC | PRN
  Administered 2014-04-14 (×10): 50 ug via INTRAVENOUS

## 2014-04-14 MED ORDER — PHENYLEPHRINE 40 MCG/ML (10ML) SYRINGE FOR IV PUSH (FOR BLOOD PRESSURE SUPPORT)
PREFILLED_SYRINGE | INTRAVENOUS | Status: AC
  Filled 2014-04-14: qty 10

## 2014-04-14 MED ORDER — DEXAMETHASONE SODIUM PHOSPHATE 4 MG/ML IJ SOLN
INTRAMUSCULAR | Status: AC
  Filled 2014-04-14: qty 2

## 2014-04-14 MED ORDER — OXYCODONE HCL 5 MG/5ML PO SOLN: 5.0000 mg | Freq: Once | ORAL | Status: DC | PRN

## 2014-04-14 MED ORDER — PANTOPRAZOLE SODIUM 40 MG IV SOLR: 40.0000 mg | Freq: Every day | INTRAVENOUS | Status: DC

## 2014-04-14 MED ORDER — NEOSTIGMINE METHYLSULFATE 10 MG/10ML IV SOLN
INTRAVENOUS | Status: DC | PRN
  Administered 2014-04-14: 3 mg via INTRAVENOUS

## 2014-04-14 MED ORDER — ROCURONIUM BROMIDE 50 MG/5ML IV SOLN
INTRAVENOUS | Status: AC
  Filled 2014-04-14: qty 1

## 2014-04-14 MED ORDER — ACETAMINOPHEN 650 MG RE SUPP: 650.0000 mg | RECTAL | Status: DC | PRN

## 2014-04-14 MED ORDER — MIDAZOLAM HCL 2 MG/2ML IJ SOLN
INTRAMUSCULAR | Status: AC
  Filled 2014-04-14: qty 2

## 2014-04-14 MED ORDER — LEVOTHYROXINE SODIUM 125 MCG PO TABS
187.5000 ug | ORAL_TABLET | ORAL | Status: DC
  Administered 2014-04-17: 187.5 ug via ORAL
  Filled 2014-04-14: qty 1.5

## 2014-04-14 MED ORDER — MIDAZOLAM HCL 5 MG/5ML IJ SOLN
INTRAMUSCULAR | Status: DC | PRN
  Administered 2014-04-14: 2 mg via INTRAVENOUS

## 2014-04-14 MED ORDER — ACETAMINOPHEN 10 MG/ML IV SOLN
1000.0000 mg | Freq: Four times a day (QID) | INTRAVENOUS | Status: DC
  Administered 2014-04-14: 1000 mg via INTRAVENOUS

## 2014-04-14 MED ORDER — HYDROMORPHONE HCL PF 1 MG/ML IJ SOLN
INTRAMUSCULAR | Status: AC
  Administered 2014-04-14: 0.5 mg via INTRAVENOUS
  Filled 2014-04-14: qty 2

## 2014-04-14 MED ORDER — FAMOTIDINE 20 MG PO TABS
20.0000 mg | ORAL_TABLET | Freq: Two times a day (BID) | ORAL | Status: DC
  Administered 2014-04-14 – 2014-04-18 (×8): 20 mg via ORAL
  Filled 2014-04-14 (×9): qty 1

## 2014-04-14 MED ORDER — POLYETHYLENE GLYCOL 3350 17 G PO PACK
17.0000 g | PACK | Freq: Every day | ORAL | Status: DC | PRN
  Administered 2014-04-16: 17 g via ORAL
  Filled 2014-04-14: qty 1

## 2014-04-14 MED ORDER — BUPIVACAINE HCL (PF) 0.5 % IJ SOLN
INTRAMUSCULAR | Status: DC | PRN
  Administered 2014-04-14: 20 mL

## 2014-04-14 MED ORDER — LEVOTHYROXINE SODIUM 125 MCG PO TABS: 125.0000 ug | ORAL_TABLET | ORAL | Status: DC

## 2014-04-14 MED ORDER — BISACODYL 5 MG PO TBEC: 5.0000 mg | DELAYED_RELEASE_TABLET | Freq: Every day | ORAL | Status: DC | PRN

## 2014-04-14 MED ORDER — THROMBIN 20000 UNITS EX SOLR
CUTANEOUS | Status: DC | PRN
  Administered 2014-04-14: 09:00:00 via TOPICAL

## 2014-04-14 MED ORDER — BUPIVACAINE-EPINEPHRINE (PF) 0.5% -1:200000 IJ SOLN
INTRAMUSCULAR | Status: AC
Start: 2014-04-14 — End: 2014-04-14
  Filled 2014-04-14: qty 30

## 2014-04-14 MED ORDER — SODIUM CHLORIDE 0.9 % IV SOLN
INTRAVENOUS | Status: DC
  Administered 2014-04-14 – 2014-04-16 (×4): via INTRAVENOUS

## 2014-04-14 MED ORDER — BUPIVACAINE LIPOSOME 1.3 % IJ SUSP
INTRAMUSCULAR | Status: DC | PRN
  Administered 2014-04-14: 20 mL

## 2014-04-14 MED ORDER — SODIUM CHLORIDE 0.9 % IV SOLN: 250.0000 mL | INTRAVENOUS | Status: DC

## 2014-04-14 MED ORDER — LIDOCAINE HCL (CARDIAC) 20 MG/ML IV SOLN
INTRAVENOUS | Status: DC | PRN
  Administered 2014-04-14: 40 mg via INTRAVENOUS

## 2014-04-14 MED ORDER — DOCUSATE SODIUM 100 MG PO CAPS
100.0000 mg | ORAL_CAPSULE | Freq: Two times a day (BID) | ORAL | Status: DC
  Administered 2014-04-14 – 2014-04-18 (×9): 100 mg via ORAL
  Filled 2014-04-14 (×11): qty 1

## 2014-04-14 MED ORDER — FLEET ENEMA 7-19 GM/118ML RE ENEM: 1.0000 | ENEMA | Freq: Once | RECTAL | Status: AC | PRN

## 2014-04-14 MED ORDER — SODIUM CHLORIDE 0.9 % IJ SOLN: 3.0000 mL | INTRAMUSCULAR | Status: DC | PRN

## 2014-04-14 MED ORDER — PHENYLEPHRINE HCL 10 MG/ML IJ SOLN
INTRAMUSCULAR | Status: DC | PRN
  Administered 2014-04-14: 80 ug via INTRAVENOUS
  Administered 2014-04-14 (×4): 40 ug via INTRAVENOUS

## 2014-04-14 MED ORDER — SUCCINYLCHOLINE CHLORIDE 20 MG/ML IJ SOLN
INTRAMUSCULAR | Status: AC
  Filled 2014-04-14: qty 1

## 2014-04-14 MED ORDER — HYDROMORPHONE HCL PF 1 MG/ML IJ SOLN
0.2500 mg | INTRAMUSCULAR | Status: DC | PRN
  Administered 2014-04-14 (×4): 0.5 mg via INTRAVENOUS

## 2014-04-14 MED ORDER — GLYCOPYRROLATE 0.2 MG/ML IJ SOLN
INTRAMUSCULAR | Status: AC
  Filled 2014-04-14: qty 2

## 2014-04-14 MED ORDER — HYDROCODONE-ACETAMINOPHEN 5-325 MG PO TABS
1.0000 | ORAL_TABLET | ORAL | Status: DC | PRN
  Administered 2014-04-15: 1 via ORAL
  Administered 2014-04-15: 2 via ORAL
  Filled 2014-04-14: qty 2
  Filled 2014-04-14 (×2): qty 1

## 2014-04-14 SURGICAL SUPPLY — 68 items
ADH SKN CLS APL DERMABOND .7 (GAUZE/BANDAGES/DRESSINGS) ×1
BLADE SURG ROTATE 9660 (MISCELLANEOUS) IMPLANT
BUR ROUND FLUTED 4 SOFT TCH (BURR) ×2 IMPLANT
CAGE CONCORDE BULLET 9X11X23 (Cage) ×2 IMPLANT
CAGE SPNL 5D BLT NOSE 23X9X11 (Cage) IMPLANT
CORDS BIPOLAR (ELECTRODE) ×2 IMPLANT
COVER MAYO STAND STRL (DRAPES) ×4 IMPLANT
COVER SURGICAL LIGHT HANDLE (MISCELLANEOUS) ×2 IMPLANT
DERMABOND ADVANCED (GAUZE/BANDAGES/DRESSINGS) ×1
DERMABOND ADVANCED .7 DNX12 (GAUZE/BANDAGES/DRESSINGS) ×1 IMPLANT
DRAPE C-ARM 42X72 X-RAY (DRAPES) ×2 IMPLANT
DRAPE INCISE IOBAN 66X45 STRL (DRAPES) ×1 IMPLANT
DRAPE SURG 17X23 STRL (DRAPES) ×6 IMPLANT
DRAPE TABLE COVER HEAVY DUTY (DRAPES) ×2 IMPLANT
DRSG MEPILEX BORDER 4X4 (GAUZE/BANDAGES/DRESSINGS) ×1 IMPLANT
DRSG MEPILEX BORDER 4X8 (GAUZE/BANDAGES/DRESSINGS) ×1 IMPLANT
DURAPREP 26ML APPLICATOR (WOUND CARE) ×2 IMPLANT
ELECT BLADE 6.5 EXT (BLADE) IMPLANT
ELECT CAUTERY BLADE 6.4 (BLADE) ×2 IMPLANT
ELECT REM PT RETURN 9FT ADLT (ELECTROSURGICAL) ×2
ELECTRODE REM PT RTRN 9FT ADLT (ELECTROSURGICAL) ×1 IMPLANT
EVACUATOR 1/8 PVC DRAIN (DRAIN) IMPLANT
GLOVE BIOGEL PI IND STRL 7.5 (GLOVE) ×1 IMPLANT
GLOVE BIOGEL PI INDICATOR 7.5 (GLOVE) ×1
GLOVE ECLIPSE 7.0 STRL STRAW (GLOVE) ×2 IMPLANT
GLOVE ECLIPSE 9.0 STRL (GLOVE) ×2 IMPLANT
GLOVE SURG 8.5 LATEX PF (GLOVE) ×2 IMPLANT
GOWN STRL REUS W/ TWL LRG LVL3 (GOWN DISPOSABLE) ×2 IMPLANT
GOWN STRL REUS W/TWL 2XL LVL3 (GOWN DISPOSABLE) ×2 IMPLANT
GOWN STRL REUS W/TWL LRG LVL3 (GOWN DISPOSABLE) ×4
HEMOSTAT SURGICEL 2X14 (HEMOSTASIS) IMPLANT
KIT BASIN OR (CUSTOM PROCEDURE TRAY) ×2 IMPLANT
KIT POSITION SURG JACKSON T1 (MISCELLANEOUS) ×2 IMPLANT
KIT ROOM TURNOVER OR (KITS) ×2 IMPLANT
NDL ASP BONE MRW 11GX15 (NEEDLE) IMPLANT
NDL SPNL 18GX3.5 QUINCKE PK (NEEDLE) ×1 IMPLANT
NEEDLE 22X1 1/2 (OR ONLY) (NEEDLE) ×2 IMPLANT
NEEDLE ASP BONE MRW 11GX15 (NEEDLE) ×2 IMPLANT
NEEDLE BONE MARROW 8GAX6 (NEEDLE) IMPLANT
NEEDLE SPNL 18GX3.5 QUINCKE PK (NEEDLE) ×2 IMPLANT
NS IRRIG 1000ML POUR BTL (IV SOLUTION) ×2 IMPLANT
PACK LAMINECTOMY ORTHO (CUSTOM PROCEDURE TRAY) ×2 IMPLANT
PAD ARMBOARD 7.5X6 YLW CONV (MISCELLANEOUS) ×4 IMPLANT
PATTIES SURGICAL .75X.75 (GAUZE/BANDAGES/DRESSINGS) ×1 IMPLANT
PATTIES SURGICAL 1X1 (DISPOSABLE) ×1 IMPLANT
ROD EXPENDIUM 6.35X35MM (Rod) ×1 IMPLANT
ROD PREBENT EXPEDIUM 6.35 40MM (Rod) ×1 IMPLANT
SCREW EXPEDIUM 6.35 7.0 35 (Screw) ×2 IMPLANT
SCREW POLY EXP 6.35 7X40MM (Screw) ×2 IMPLANT
SCREW SET EXPEDIUM 6.35 (Screw) ×4 IMPLANT
SHEET CONFORM 45LX20WX7H (Bone Implant) ×1 IMPLANT
SPONGE GAUZE 4X4 12PLY (GAUZE/BANDAGES/DRESSINGS) ×2 IMPLANT
SPONGE LAP 4X18 X RAY DECT (DISPOSABLE) ×3 IMPLANT
SUT VIC AB 0 CT1 27 (SUTURE) ×2
SUT VIC AB 0 CT1 27XBRD ANBCTR (SUTURE) ×1 IMPLANT
SUT VIC AB 1 CTX 36 (SUTURE) ×4
SUT VIC AB 1 CTX36XBRD ANBCTR (SUTURE) ×2 IMPLANT
SUT VIC AB 2-0 CT1 27 (SUTURE) ×2
SUT VIC AB 2-0 CT1 TAPERPNT 27 (SUTURE) ×1 IMPLANT
SUT VICRYL 0 CT 1 36IN (SUTURE) ×4 IMPLANT
SUT VICRYL 4-0 PS2 18IN ABS (SUTURE) ×4 IMPLANT
SYR CONTROL 10ML LL (SYRINGE) ×4 IMPLANT
TOWEL OR 17X24 6PK STRL BLUE (TOWEL DISPOSABLE) ×2 IMPLANT
TOWEL OR 17X26 10 PK STRL BLUE (TOWEL DISPOSABLE) ×3 IMPLANT
TRAY FOLEY CATH 14FR (SET/KITS/TRAYS/PACK) ×1 IMPLANT
TRAY FOLEY CATH 16FRSI W/METER (SET/KITS/TRAYS/PACK) ×1 IMPLANT
WATER STERILE IRR 1000ML POUR (IV SOLUTION) ×1 IMPLANT
YANKAUER SUCT BULB TIP NO VENT (SUCTIONS) ×2 IMPLANT

## 2014-04-14 NOTE — Anesthesia Preprocedure Evaluation (Addendum)
Anesthesia Evaluation  Patient identified by MRN, date of birth, ID band Patient awake    Reviewed: Allergy & Precautions, H&P , NPO status , Patient's Chart, lab work & pertinent test results, reviewed documented beta blocker date and time   History of Anesthesia Complications (+) PONV, PROLONGED EMERGENCE and history of anesthetic complications  Airway Mallampati: II TM Distance: >3 FB Neck ROM: full    Dental  (+) Edentulous Upper, Edentulous Lower, Dental Advisory Given   Pulmonary shortness of breath and with exertion, asthma , sleep apnea , pneumonia -, resolved, COPD COPD inhaler, Current Smoker,  breath sounds clear to auscultation        Cardiovascular hypertension, On Medications +CHF Rhythm:regular     Neuro/Psych  Headaches, PSYCHIATRIC DISORDERS TIA Neuromuscular disease CVA, Residual Symptoms negative psych ROS   GI/Hepatic Neg liver ROS, hiatal hernia, GERD-  Medicated and Controlled,  Endo/Other  negative endocrine ROSdiabetesHypothyroidism   Renal/GU negative Renal ROS  negative genitourinary   Musculoskeletal   Abdominal   Peds  Hematology negative hematology ROS (+)   Anesthesia Other Findings See surgeon's H&P   Reproductive/Obstetrics negative OB ROS                          Anesthesia Physical Anesthesia Plan  ASA: III  Anesthesia Plan: General   Post-op Pain Management:    Induction: Intravenous  Airway Management Planned: Oral ETT  Additional Equipment:   Intra-op Plan:   Post-operative Plan: Extubation in OR  Informed Consent: I have reviewed the patients History and Physical, chart, labs and discussed the procedure including the risks, benefits and alternatives for the proposed anesthesia with the patient or authorized representative who has indicated his/her understanding and acceptance.   Dental Advisory Given  Plan Discussed with: CRNA and  Surgeon  Anesthesia Plan Comments:         Anesthesia Quick Evaluation

## 2014-04-14 NOTE — Addendum Note (Signed)
Addendum created 04/14/14 1323 by Lovie CholJennifer K Ladasia Sircy, CRNA   Modules edited: Anesthesia Flowsheet

## 2014-04-14 NOTE — Progress Notes (Signed)
OT Cancellation Note  Patient Details Name: [name] MRN: [id number] DOB: 03/14/1961   Cancelled Treatment:    Reason Eval/Treat Not Completed: Pain limiting ability to participate  Earlie Raveling OTR/L [phone] 04/14/2014, 2:58 PM

## 2014-04-14 NOTE — Op Note (Signed)
04/14/2014  11:03 AM  PATIENT:  [name]  53 y.o. female  MRN: [id number]  OPERATIVE REPORT  PRE-OPERATIVE DIAGNOSIS:  L4-5 painful spondylolisthesis  POST-OPERATIVE DIAGNOSIS:  L4-5 painful spondylolisthesis  PROCEDURE:  Procedure(s): Left L4-5 transforaminal lumbar interbody fusion, posterolateral fusion with pedicle screws, rods, cages and local bone graft, Conform allograft charged with bone marrow aspirated from the left L4 vertebral body.    SURGEON:  Jessy Oto, MD     ASSISTANT:  Phillips Hay, PA-C  (Present throughout the entire procedure and necessary for completion of procedure in a timely manner)     ANESTHESIA:  General,supplemented with local anesthetic marcaine 1/2% 1:1 exparel 1.3% total 30 cc, Dr. Albertina Parr.    COMPLICATIONS:  None.  DRAINS: Foley to SD. Hemovac x 1 left lumbar.     COMPONENTS: L4: 2x 18mx40mm expedium pedicle screws, L5: 2x 745m35mm expedium pedicle screws. Conform 7cc with bone marrow aspirated left L4 transpedicular vertebral body   PROCEDURE:The patient was met in the holding area, and the appropriate lumbar level Left L4-5  identified and marked with an x and my initials.The patient was then transported to OR. The patient was then placed under general anesthesia without difficulty.The patient received appropriate preoperative antibiotic prophylaxis clindamycin 600 mg IV.  Nursing staff inserted a Foley catheter under sterile conditions. She was then turned to a prone position [employer] table was used for this case. All pressure points were well padded PAS stocking applied bilateral lower extremity to prevent DVT. Standard prep DuraPrep solution. Draped in the usual manner. Time-out procedure was called and correct .Skin in the Midline from L2 to L5 was infiltrated with local anesthetic marcaine 1/2% 1:1 exparel 1.3% total 20 cc.  The incision was made L4-5 and carried superiorly an additional 2 levels to the L2 spinous process and  caudle to the S1 spinous process.. Gertie Exonlectric cautery was used to control bleeding and carefully dissection was carried down along the lateral aspects of the spinous process of L3 to L5. Cobb then used to carefully elevate the paralumbar muscle and the incision in the midline was carried to to the level of the base of the residual spinous processes. The posterior exposure area extending from the base of the spinous process of L3 to the superior aspect of L5 was carried expose at its edges debrided the scar tissue using a large curette. A Kocher clamp was placed at the L3-4 and L4-5 interspinous processes and intraoperative lateral confirmation of the appropriate level with C-arm sterilely draped. Osteotome was then used to resect the inferior articular process of left L4 removing with 3 mm Kerrison and Penfield 4 and pituitary rongeur. Carefully the medial aspect of the superior tip of process of L5 was freed up of scar tissue off of the adjacent thecal sac. Osteotome used to perform osteotomy of the superior articular process on the left side at L5 decompressing the lateral recess. This was done using three point technique and resected using a 3 mm Kerrison and curettes residual bone remaining in the lateral recess was resected using 42m11merrisons.  Loupe magnification and headlight were used for this portion of the procedure. 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 The left L4 nerve root well decompressed. Hockey-stick nerve probe was it will be passed out the left L4 neuroforamen and identifying the superior aspect of the L5 pedicle then osteotome used to resect the superior articular process  of L5 transversely at the superior level of the L5 pedicle. This allowed for a wide decompression left L4 neuroforamen Attention then turned to performance of insertion of pedicle screws the L4 level.   Hand held awl was then used to make an  entry point into the intersection of the lateral aspect of the L4 pedicle with left L4 transverse process observed on C-arm fluoroscopy to be in good position alignment with the pedicle of L4 observed on lateral view. Handle held pedicle probe was then used to make an opening into the central portions of the pedicle of L4 on the left side. C-arm fluoroscopy used to verify the position alignment of the pedicle probe depth at 40 mm. Trocar used for aspiration of bone marrow was then inserted after first checking the pedicle opening using a ball-tipped probe. Ball-tipped probe indicated that there was no broaching of the cortex within the pedicle opening on the left L4 transpedicular aspiration equipment was then placed and 10 cc of bone marrow aspirate obtained and was used to charge a 10 cc strip of the Sheet Conform DBM sponge. This was then divided into sections similar to tooth picks for posterior lateral bone graft purposes. Decortication of the transverse process of the L4  was then carried out using a high-speed bur. The bone marrow charged DBM sponge was applied to the area between the transverse process of L4 and the posterior lateral L5 transverse process. Ball-tipped probe was then used to probe the channel placed on the left side at L4 pedicle again showing no broaching cortex. The 40 mm x 65m pedicle screw was then inserted at the left L4 level in the appropriate degree of convergence and lordosis. Attention then turned to the left L5 pedicle, the superior articular process was debrided and resected. An entry point into the L5 pedicle identified using the awl at the intersection of the lateral superior articular process and the L5 transverse process. Converging medially C-arm used to verify the alignment for the L5 pedicle. The pedicle was then probed to a depth of 35 mm using the gearshift pedicle probe. Decortication of the L5 transverse process was carried out using a high-speed bur and bone graft  applied. Check made with ball-tip probe to ensure no penetration of the cortex of the L5 pedicle. A 7 mm x 35 mm expedieum screw was then inserted degree of lordosis and in the correct degree of convergence. The C-arm was used to verify the position alignment of pedicle screw L5 nerve root.   The lateral aspect of the thecal sac at the L4-5 level was then able to be mobilized with Penfield 4 and the disc space at the L4-5 level identified and the nerve structures retracted with Derricho retractor. 15 blade scalpel used to incise the disc the left side. L4-5 discectomy on the left side carried out with pituitary ronguers. Following debridement of degenerative disc material from the left side L4-5 disc, the disc space was dilated using 7 mm through 11 mm disc space dilators.excellent distraction of the disc space was obtained. The disc space was then prepared using straight curette and ring curettes. Degenerated disc as well as a cartilage endplates were debrided from the disc place using pituitary rongeurs,currettes. The disc space examined demonstrating good bleeding endplate bone surfaces. Bone graft that was harvested from the facet was placed into the intervertebral disc space after first sounding the disc space for the correct cage size. Cage chosen was a 156mConcorde parallel cage. Local bone  graft was then impacted into the intervertebral disc space at L4-5 first placing within the disc space using a forceps then impacted with an 32m trial spacer. After performing this 3 times  the permanent 11 mm x 213m Concorde lordotic cage packed with bone graft was then inserted in approximately 15-20 of convergence. Careful inspection of the spinal canal demonstrated no surgery bone graft within the spinal canal both the L4 and L5 nerve roots appeared to exit without further compression.  C-arm images were used to confirm the permanent cage implant as well as positioning of the implant well within the intervertebral  disc this cage was subset deep to the posterior aspect of the disc space by about 3-4 mm.. A 40 mm precontoured rod was then carefully inserted into the fasteners extending from L4 to L5 on the left side. The fastener caps at L 4. and L5 level was then placed and the L4 fastener cap torqued to 80 foot-pounds. Compression between the fasteners L4 and L5  was then performed and the cap at the L5 level was tightened to 80 foot pounds.   Attention then turned to the placement of the pedicle screw on the right side at L4  this was done similar to the right  side. Awl used to make an initial entry point verified with C-arm fluoroscopy then a hand-held pedicle probe used to probe the pedicle channel, checked with a ball-tip probe and verify no broaching of cortex. Pedicle probe of L4 noted on C-arm fluoroscopy to be in the correct degree of lordosis centered within the pedicle and a length of about 40 mm screw. Tapping was then performed using is 6 mm tap again checking the opening within the pedicle with a pedicle probe to ensure no broaching cortex. 48m33m 40 mm pedicle screw Depuy Expedium type was then inserted into the right L4 pedicle after first decorticating the transverse process of L4. The bone marrow charged DBM sponge was then inserted between the transverse process of L4 on the right side to the upper aspect of the posterior L5 transverse process. L4 Pedicle screw inserted in excellent position alignment observed with C-arm fluoroscopy. Attention then turned to the left L5 pedicle,  the superior articular process L5 was debrided and resected. An entry point into the L5 pedicle identified using the awl at the intersection of the superior articular process of L5 and the transverse process of L5. Converging medially C-arm used to verify the alignment for the L5 pedicle. The pedicle was then probed to a depth of 35 mm using the gearshift pedicle probe. Decortication of the L5 transverse process and right L4-5  facet was carried out using a high-speed bur and bone graft applied. Check made with ball-tip probe to ensure no penetration of the cortex of the L5 pedicle. A 7 mm x 35 mm expedieum screw was then inserted degree of lordosis and in the correct degree of convergence. The C-arm was used to verify the position alignment of pedicle screw. A 35 mm rod was then inserted into the fasteners extending from L4 to L5 the right side. Fastener caps then applied to the L5 pedicle screw fastener and these were tightened to 80 foot-pounds. The fastener cap at the L4 level was then inserted and compression obtained between the fastener at L4 and at L5 and the L5 cap tightened to 80 foot-pounds. Irrigation was carried out using copious amounts of irrigant solution. Thrombin-soaked Gelfoam were placed for hemostasis was then carefully removed. Permanent  C-arm images were obtained in AP oblique and lateral positions for documentation purposes. They showed the pedicle screws rods to be in good position alignment from L4-5 . Cell Saver was used during this case however there 300 cc blood loss and a total of 0 cc of Cell Saver blood was returned.  A medium Hemovac drain was placed in the left lower lumbar spine and the midline incision away from neural structures the deep paralumbar muscles were approximated with 1 Vicryl sutures, the lumbodorsal fascia approximated with 0 and 1 Vicryl sutures. The fascia injected with further local anesthetic.  Subcutaneous layers were then approximated with interrupted 0 and 2-0 Vicryl sutures , skin closed with a running subcuticular 4-0 Vicryl suture. The dermabond was then applied. Mepilex bandage was then applied. The exiting Hemovac drain site and carefully bandage. All instrument and sponge counts were correct. Patient was then returned to the supine position reactivated extubated and returned to the recovery room in satisfactory condition. Phillips Hay PA-C perform the duties of assistant  surgeon during this case. She was present from the beginning of the case to the end of the case assisting in transfer the patient from his stretcher to the OR table and back to the stretcher at the end of the case. Assisted in careful retraction and suction of the laminectomy site delicate neural structures operating under the operating room microscope. She performed closure of the incision from the fascia to the skin applying the dressing.    Jessy Oto 04/14/2014, 11:03 AM

## 2014-04-14 NOTE — Evaluation (Signed)
Physical Therapy Evaluation Patient Details Name: Nichole HeinzBecky L Daisey MRN: [id number] DOB: 1961/03/05 Today's Date: 04/14/2014   History of Present Illness  53 yr old female s/p L4-5 Transforaminal interbody fusion  Clinical Impression  Patient is seen following the above procedure and presents with functional limitations due to the deficits listed below (see PT Problem List). Pt mobility limited by pain this afternoon and therefore spent time training with bed mobility and safely donning/doffing spinal brace. Patient will benefit from skilled PT to increase their independence and safety with mobility to allow discharge to the venue listed below.      Follow Up Recommendations Home health PT;Supervision for mobility/OOB    Equipment Recommendations  3in1 (PT)    Recommendations for Other Services OT consult     Precautions / Restrictions Precautions Precautions: Back Precaution Booklet Issued: Yes (comment) Precaution Comments: Reviewed back precautions Required Braces or Orthoses: Spinal Brace Restrictions Weight Bearing Restrictions: No Other Position/Activity Restrictions: No bending arching or twisting      Mobility  Bed Mobility Overal bed mobility: Needs Assistance Bed Mobility: Rolling;Sidelying to Sit;Sit to Sidelying Rolling: Min guard Sidelying to sit: Min assist     Sit to sidelying: Min assist General bed mobility comments: VCs for technique with log roll with HOB flat. Pt able to roll without assist, requires min assist for LEs with on/off bed and to assist trunk with sidelying to sit.  Transfers Overall transfer level: Needs assistance Equipment used: Rolling walker (2 wheeled) Transfers: Sit to/from Stand Sit to Stand: Min guard         General transfer comment: Min guard for safety with VCs for hand placement. Safely maintains back precautions  Ambulation/Gait                Stairs            Wheelchair Mobility    Modified Rankin  (Stroke Patients Only)       Balance Overall balance assessment: Needs assistance Sitting-balance support: No upper extremity supported;Feet supported Sitting balance-Leahy Scale: Fair     Standing balance support: No upper extremity supported Standing balance-Leahy Scale: Fair                               Pertinent Vitals/Pain Pt reports pain as "high" no numerical value given Nurse aware and is in room with pt at end of therapy session Patient repositioned in bed for comfort.     Home Living Family/patient expects to be discharged to:: Private residence Living Arrangements: Spouse/significant other;Other relatives;Children Available Help at Discharge: Family;Available 24 hours/day Type of Home: House Home Access: Stairs to enter Entrance Stairs-Rails: Left Entrance Stairs-Number of Steps: 4 Home Layout: One level Home Equipment: Walker - 2 wheels;Cane - single point;Shower seat      Prior Function Level of Independence: Independent               Hand Dominance        Extremity/Trunk Assessment   Upper Extremity Assessment: Defer to OT evaluation           Lower Extremity Assessment: Generalized weakness         Communication   Communication: No difficulties  Cognition Arousal/Alertness: Awake/alert Behavior During Therapy: WFL for tasks assessed/performed Overall Cognitive Status: Within Functional Limits for tasks assessed                      General Comments  General comments (skin integrity, edema, etc.): Spent time educating patient on back precautions and use of spinal brace. She was able to put this on in sitting while safely maintaining back precautions. She states she is in too much pain currently to ambulate to chair and sit, therefore requested to get back in bed.    Exercises        Assessment/Plan    PT Assessment Patient needs continued PT services  PT Diagnosis Generalized weakness;Acute pain   PT  Problem List Decreased strength;Decreased range of motion;Decreased activity tolerance;Decreased balance;Decreased mobility;Decreased knowledge of use of DME;Decreased knowledge of precautions;Pain  PT Treatment Interventions DME instruction;Gait training;Stair training;Functional mobility training;Therapeutic activities;Therapeutic exercise;Balance training;Neuromuscular re-education;Patient/family education;Modalities   PT Goals (Current goals can be found in the Care Plan section) Acute Rehab PT Goals Patient Stated Goal: Go home PT Goal Formulation: With patient Time For Goal Achievement: 04/21/14 Potential to Achieve Goals: Good    Frequency Min 5X/week   Barriers to discharge        Co-evaluation               End of Session Equipment Utilized During Treatment: Back brace Activity Tolerance: Patient limited by pain Patient left: in bed;with call bell/phone within reach;with nursing/sitter in room Nurse Communication: Mobility status         Time: 8592-9244 PT Time Calculation (min): 31 min   Charges:   PT Evaluation $Initial PT Evaluation Tier I: 1 Procedure PT Treatments $Self Care/Home Management: 8-22   PT G Codes:         Charlsie Merles, [place] [phone]  Berton Mount 04/14/2014, 4:52 PM

## 2014-04-14 NOTE — Progress Notes (Signed)
Utilization review completed.  

## 2014-04-14 NOTE — Anesthesia Procedure Notes (Signed)
Procedure Name: Intubation Date/Time: 04/14/2014 7:46 AM Performed by: Lovie CholOCK, Denyla Cortese K Pre-anesthesia Checklist: Patient identified, Emergency Drugs available, Suction available, Patient being monitored and Timeout performed Patient Re-evaluated:Patient Re-evaluated prior to inductionOxygen Delivery Method: Circle system utilized Preoxygenation: Pre-oxygenation with 100% oxygen Intubation Type: IV induction Ventilation: Mask ventilation without difficulty Grade View: Grade I Tube type: Oral Tube size: 7.0 mm Number of attempts: 1 Airway Equipment and Method: Stylet and Video-laryngoscopy Placement Confirmation: ETT inserted through vocal cords under direct vision,  positive ETCO2,  CO2 detector and breath sounds checked- equal and bilateral Secured at: 21 cm Tube secured with: Tape Dental Injury: Teeth and Oropharynx as per pre-operative assessment

## 2014-04-14 NOTE — Brief Op Note (Signed)
04/14/2014  11:00 AM  PATIENT:  [name]  53 y.o. female  PRE-OPERATIVE DIAGNOSIS:  L4-5 painful spondylolisthesis  POST-OPERATIVE DIAGNOSIS:  L4-5 painful spondylolisthesis  PROCEDURE:  Procedure(s): Left L4-5 transforaminal lumbar interbody fusion, posterolateral fusion with pedicle screws, rods, cages and local bone graft (N/A)  SURGEON:  Surgeon(s) and Role:    * Kerrin Champagne, MD - Primary  PHYSICIAN ASSISTANT:Sheila Marita [place], PA-C   ANESTHESIA:   local and general Dr. Gelene Mink.  EBL:  Total I/O In: 1750 [I.V.:1500; IV Piggyback:250] Out: 550 [Urine:300; Blood:250]  BLOOD ADMINISTERED:none  DRAINS: (One) Hemovact drain(s) in the Left lumbar with  Suction Open and Urinary Catheter (Foley)   LOCAL MEDICATIONS USED:  MARCAINE 1/2% 1:1 exparel 1.3%, Amount: 30 ml   SPECIMEN:  No Specimen  DISPOSITION OF SPECIMEN:  N/A  COUNTS:  YES  TOURNIQUET:  * No tourniquets in log *  DICTATION: .Dragon Dictation  PLAN OF CARE: Admit to inpatient   PATIENT DISPOSITION:  PACU - hemodynamically stable.   Delay start of Pharmacological VTE agent (>24hrs) due to surgical blood loss or risk of bleeding: yes

## 2014-04-14 NOTE — Transfer of Care (Signed)
Immediate Anesthesia Transfer of Care Note  Patient: [name]  Procedure(s) Performed: Procedure(s): Left L4-5 transforaminal lumbar interbody fusion, posterolateral fusion with pedicle screws, rods, cages and local bone graft (N/A)  Patient Location: PACU  Anesthesia Type:General  Level of Consciousness: awake, oriented and patient cooperative  Airway & Oxygen Therapy: Patient Spontanous Breathing and Patient connected to face mask oxygen  Post-op Assessment: Report given to PACU RN and Post -op Vital signs reviewed and stable  Post vital signs: Reviewed  Complications: No apparent anesthesia complications

## 2014-04-14 NOTE — Discharge Instructions (Signed)

## 2014-04-14 NOTE — Anesthesia Postprocedure Evaluation (Signed)
Anesthesia Post Note  Patient: [name]  Procedure(s) Performed: Procedure(s) (LRB): Left L4-5 transforaminal lumbar interbody fusion, posterolateral fusion with pedicle screws, rods, cages and local bone graft (N/A)  Anesthesia type: General  Patient location: PACU  Post pain: Pain level controlled  Post assessment: Patient's Cardiovascular Status Stable  Last Vitals:  Filed Vitals:   04/14/14 1230  BP: 93/52  Pulse: 90  Temp:   Resp: 13    Post vital signs: Reviewed and stable  Level of consciousness: alert  Complications: No apparent anesthesia complications

## 2014-04-14 NOTE — Interval H&P Note (Signed)
History and Physical Interval Note:  04/14/2014 7:11 AM  [name]  has presented today for surgery, with the diagnosis of L4-5 painful spondylolisthesis  The various methods of treatment have been discussed with the patient and family. After consideration of risks, benefits and other options for treatment, the patient has consented to  Procedure(s): Left L4-5 transforaminal lumbar interbody fusion, posterolateral fusion with pedicle screws, rods, cages and local bone graft (N/A) as a surgical intervention .  The patient's history has been reviewed, patient examined, no change in status, stable for surgery.  I have reviewed the patient's chart and labs.  Questions were answered to the patient's satisfaction.     Kerrin Champagne

## 2014-04-14 NOTE — Care Management Note (Signed)
CARE MANAGEMENT NOTE 04/14/2014  Patient:  [name], [name]   Account Number:  [id number]  Date Initiated:  04/14/2014  Documentation initiated by:  Vance Peper  Subjective/Objective Assessment:   53 yr old female s/p L4-5 Transforaminal interbody fusion.     Action/Plan:   PT/OT eval  Case manager will continue to monitor.   Anticipated DC Date:     Anticipated DC Plan:           Choice offered to / List presented to:             Status of service:  In process, will continue to follow Medicare Important Message given?   (If response is "NO", the following Medicare IM given

## 2014-04-15 LAB — CBC
HEMATOCRIT: 26.2 % — AB (ref 36.0–46.0)
HEMOGLOBIN: 8.7 g/dL — AB (ref 12.0–15.0)
MCH: 30.1 pg (ref 26.0–34.0)
MCHC: 33.2 g/dL (ref 30.0–36.0)
MCV: 90.7 fL (ref 78.0–100.0)
Platelets: 178 10*3/uL (ref 150–400)
RBC: 2.89 MIL/uL — ABNORMAL LOW (ref 3.87–5.11)
RDW: 13.3 % (ref 11.5–15.5)
WBC: 9 10*3/uL (ref 4.0–10.5)

## 2014-04-15 LAB — HEMOGLOBIN AND HEMATOCRIT, BLOOD
HCT: 26.4 % — ABNORMAL LOW (ref 36.0–46.0)
Hemoglobin: 8.7 g/dL — ABNORMAL LOW (ref 12.0–15.0)

## 2014-04-15 MED ORDER — OXYCODONE HCL ER 20 MG PO T12A
20.0000 mg | EXTENDED_RELEASE_TABLET | Freq: Two times a day (BID) | ORAL | Status: DC
  Administered 2014-04-15 – 2014-04-17 (×4): 20 mg via ORAL
  Filled 2014-04-15 (×4): qty 1

## 2014-04-15 MED ORDER — OXYCODONE HCL ER 10 MG PO T12A: 10.0000 mg | EXTENDED_RELEASE_TABLET | Freq: Two times a day (BID) | ORAL | Status: DC

## 2014-04-15 MED ORDER — OXYCODONE HCL 5 MG PO TABS
5.0000 mg | ORAL_TABLET | ORAL | Status: DC | PRN
  Administered 2014-04-15 – 2014-04-17 (×8): 5 mg via ORAL
  Filled 2014-04-15 (×8): qty 1

## 2014-04-15 NOTE — Progress Notes (Signed)
Subjective: 1 Day Post-Op Procedure(s) (LRB): Left L4-5 transforaminal lumbar interbody fusion, posterolateral fusion with pedicle screws, rods, cages and local bone graft (N/A) Patient reports pain as moderate.    Objective: Vital signs in last 24 hours: Temp:  [98 F (36.7 C)-98.4 F (36.9 C)] 98.4 F (36.9 C) (06/09 0624) Pulse Rate:  [75-94] 75 (06/09 0624) Resp:  [12-17] 16 (06/09 0624) BP: (91-134)/(46-62) 98/47 mmHg (06/09 0624) SpO2:  [98 %-100 %] 98 % (06/09 0624)  Intake/Output from previous day: 06/08 0701 - 06/09 0700 In: 2250 [I.V.:2000; IV Piggyback:250] Out: 4325 [Urine:3800; Drains:275; Blood:250] Intake/Output this shift: Total I/O In: 360 [P.O.:360] Out: 250 [Urine:250]   Recent Labs  04/15/14 0520  HGB 8.7*    Recent Labs  04/15/14 0520  WBC 9.0  RBC 2.89*  HCT 26.2*  PLT 178   No results found for this basename: NA, K, CL, CO2, BUN, CREATININE, GLUCOSE, CALCIUM,  in the last 72 hours No results found for this basename: LABPT, INR,  in the last 72 hours  ABD soft Sensation intact distally Intact pulses distally Dorsiflexion/Plantar flexion intact Incision: no drainage and hemovac removed  Assessment/Plan: 1 Day Post-Op Procedure(s) (LRB): Left L4-5 transforaminal lumbar interbody fusion, posterolateral fusion with pedicle screws, rods, cages and local bone graft (N/A) Advance diet Up with therapy Acute blood loss anemia: will monitor.  Slightly "light headed" this am.  Will repeat H&H in am  Kerrin Champagne 04/15/2014, 10:33 AM

## 2014-04-15 NOTE — Progress Notes (Signed)
Patient requests Percocet for pain management. Patient has met 4000 mg daily maximum. MD notified. RN ordered  via telephone to discontinue all Tylenol and Tylenol related medications (Vicodin, Percocet) to prevent patient from getting Tylenol poisoning. RN ordered to increase Oxycontin dose from 10 mg to 20 mg and to add Oxycodone 5 mg every 3 hours for breakthrough pain as needed.

## 2014-04-15 NOTE — Progress Notes (Signed)
Occupational Therapy Treatment Patient Details Name: SENAIDA KLINGLER MRN: [id number] DOB: [birth year] Today's Date: 04/15/2014    History of present illness 53 yr old female s/p L4-5 Transforaminal interbody fusion   OT comments  Pt seen for acute OT session. Bed mobility, sit<>stand transfers, and donning/doffing brace addressed this session with pt performing supervision>minguard. Pt limited this session by continued c/o dizziness when EOB/OOB.  Pt noted to sway in standing due to dizziness. OT to continue to follow.  Follow Up Recommendations  Supervision/Assistance - 24 hour    Equipment Recommendations  3 in 1 bedside comode    Recommendations for Other Services      Precautions / Restrictions Precautions Precautions: Back Precaution Booklet Issued: Yes (comment) Precaution Comments: Pt able to state 2/3 back precautions. Reviewed precautions. Required Braces or Orthoses: Spinal Brace Spinal Brace: Applied in sitting position Restrictions Weight Bearing Restrictions: No Other Position/Activity Restrictions: No bending arching or twisting       Mobility Bed Mobility Overal bed mobility: Needs Assistance Bed Mobility: Sidelying to Sit;Sit to Sidelying   Sidelying to sit: HOB elevated;Min guard     Sit to sidelying: Min guard General bed mobility comments: vc for technique  Transfers Overall transfer level: Needs assistance Equipment used: Rolling walker (2 wheeled) Transfers: Sit to/from Stand Sit to Stand: Min guard         General transfer comment: vc for hand placement during sit<>stand, cues to slow descent during stand>sit    Balance Overall balance assessment: Needs assistance Sitting-balance support: Bilateral upper extremity supported;Feet supported Sitting balance-Leahy Scale: Fair     Standing balance support: Bilateral upper extremity supported;During functional activity Standing balance-Leahy Scale: Poor Standing balance comment: pt noted to  sway in standing due to dizziness                   ADL Overall ADL's : Needs assistance/impaired                                       General ADL Comments: Limited mobility this session due to dizziness and unsteady balance EOB. Educated on techniques for safe completionof ADLs with back precautions. Pt able to don/doff brace EOB with cues to tighten straps before placing them.       Vision                     Perception     Praxis      Cognition   Behavior During Therapy: [facility] for tasks assessed/performed Overall Cognitive Status: Within Functional Limits for tasks assessed                       Extremity/Trunk Assessment               Exercises     Shoulder Instructions       General Comments      Pertinent Vitals/ Pain       Moderate pain in back. Dizziness EOB/OOB  Home Living                                          Prior Functioning/Environment              Frequency Min 3X/week     Progress Toward Goals  OT Goals(current goals can now be found in the care plan section)  Progress towards OT goals: Progressing toward goals  Acute Rehab OT Goals Patient Stated Goal: not stated OT Goal Formulation: With patient Time For Goal Achievement: 04/22/14 Potential to Achieve Goals: Good ADL Goals Pt Will Perform Grooming: with supervision;standing Pt Will Perform Lower Body Bathing: with supervision;with adaptive equipment;sit to/from stand Pt Will Perform Upper Body Dressing: with supervision;sitting Pt Will Transfer to Toilet: with supervision;ambulating Pt Will Perform Toileting - Clothing Manipulation and hygiene: with supervision;sit to/from stand Pt Will Perform Tub/Shower Transfer: with supervision;ambulating;3 in 1;rolling walker Additional ADL Goal #1: Pt will complete bed mobility while observing back precautions at supervision level to prepare for OOB ADLS.  Plan Discharge plan  remains appropriate    Co-evaluation                 End of Session Equipment Utilized During Treatment: Gait belt;Rolling walker   Activity Tolerance Other (comment) (Pt limited by dizziness EOB/OOB)   Patient Left in bed;with call bell/phone within reach   Nurse Communication Other (comment) (tape on IV site coming loose)        Time: [id number] OT Time Calculation (min): 23 min  Charges: OT General Charges $OT Visit: 1 Procedure OT Treatments $Self Care/Home Management : 23-37 mins  Pilar GrammesKathryn H Modine Oppenheimer 04/15/2014, 4:08 PM

## 2014-04-15 NOTE — Evaluation (Signed)
Occupational Therapy Evaluation Patient Details Name: [name] MRN: [id number] DOB: July 24, 1961 Today's Date: 04/15/2014    History of Present Illness 53 yr old female s/p L4-5 Transforaminal interbody fusion   Clinical Impression   Pt admitted with the above diagnoses and presents with below problem list. Pt will benefit from continued acute OT to address the below listed deficits and maximize independence with basic ADLs prior to d/c home with family. PTA patient was independent with ADLs. Reviewed techniques and AE for safe completion of ADLs with back precautions. Bedside eval this session with pt reporting 9/10 and dizziness after just getting back in bed from transfer to [facility]. Will attempt to see pt again to further assess functional mobility.       Follow Up Recommendations  Supervision/Assistance - 24 hour    Equipment Recommendations  3 in 1 bedside comode    Recommendations for Other Services       Precautions / Restrictions Precautions Precautions: Back Precaution Booklet Issued: Yes (comment) Precaution Comments: Pt able to state 2/3 back precautions. Reviewed precautions. Required Braces or Orthoses: Spinal Brace Restrictions Weight Bearing Restrictions: No Other Position/Activity Restrictions: No bending arching or twisting      Mobility Bed Mobility                  Transfers                      Balance                                            ADL Overall ADL's : Needs assistance/impaired Eating/Feeding: Set up;Sitting   Grooming: Set up;Sitting   Upper Body Bathing: Maximal assistance   Lower Body Bathing: With adaptive equipment;Sit to/from stand;Maximal assistance   Upper Body Dressing : Sitting;Maximal assistance   Lower Body Dressing: Sit to/from stand;With adaptive equipment;Maximal assistance   Toilet Transfer: Maximal assistance;BSC;Stand-pivot   Toileting- Clothing Manipulation and Hygiene: Sit  to/from stand;Maximal assistance   Tub/ Shower Transfer: Ambulation;3 in 1;Rolling walker;Maximal assistance     General ADL Comments: Reviewed techniques and AE for safe completion of ADLs with back precautions. Bedside eval this session with pt reporting 9/10 and dizziness after just getting back in bed from transfer to [facility].      Vision                     Perception     Praxis      Pertinent Vitals/Pain 9/10 pain and dizziness. Nursing and MD aware.     Hand Dominance     Extremity/Trunk Assessment     Lower Extremity Assessment Lower Extremity Assessment: Defer to PT evaluation       Communication Communication Communication: No difficulties   Cognition Arousal/Alertness: Awake/alert Behavior During Therapy: WFL for tasks assessed/performed Overall Cognitive Status: Within Functional Limits for tasks assessed                     General Comments       Exercises       Shoulder Instructions      Home Living Family/patient expects to be discharged to:: Private residence Living Arrangements: Spouse/significant other;Other relatives;Children Available Help at Discharge: Family;Available 24 hours/day Type of Home: House Home Access: Stairs to enter [employer] of Steps: 4 Entrance Stairs-Rails: Left Home Layout:  One level     Bathroom Shower/Tub: Chief Strategy OfficerTub/shower unit   Bathroom Toilet: Handicapped height Bathroom Accessibility: Yes How Accessible: Accessible via walker Home Equipment: Walker - 2 wheels;Cane - single point;Shower seat          Prior Functioning/Environment Level of Independence: Independent             OT Diagnosis: Acute pain   OT Problem List: Decreased strength;Decreased range of motion;Impaired balance (sitting and/or standing);Decreased safety awareness;Decreased knowledge of use of DME or AE;Decreased knowledge of precautions;Pain   OT Treatment/Interventions: Self-care/ADL training;Therapeutic  exercise;DME and/or AE instruction;Therapeutic activities;Patient/family education;Balance training    OT Goals(Current goals can be found in the care plan section) Acute Rehab OT Goals Patient Stated Goal: not stated OT Goal Formulation: With patient Time For Goal Achievement: 04/22/14 Potential to Achieve Goals: Good ADL Goals Pt Will Perform Grooming: with supervision;standing Pt Will Perform Lower Body Bathing: with supervision;with adaptive equipment;sit to/from stand Pt Will Perform Upper Body Dressing: with supervision;sitting Pt Will Transfer to Toilet: with supervision;ambulating (3n1 over toilet) Pt Will Perform Toileting - Clothing Manipulation and hygiene: with supervision;sit to/from stand Pt Will Perform Tub/Shower Transfer: with supervision;ambulating;3 in 1;rolling walker Additional ADL Goal #1: Pt will complete bed mobility while observing back precautions at supervision level to prepare for OOB ADLS.  OT Frequency: Min 3X/week   Barriers to D/C:            Co-evaluation              End of Session    Activity Tolerance: Patient limited by pain;Other (comment) (dizziness, nausea) Patient left: in bed;with nursing/sitter in room;with call bell/phone within reach   Time: 1018-1030 OT Time Calculation (min): 12 min Charges:  OT General Charges $OT Visit: 1 Procedure OT Evaluation $Initial OT Evaluation Tier I: 1 Procedure G-Codes:    Pilar GrammesKathryn H Iwao Shamblin 04/15/2014, 11:12 AM

## 2014-04-15 NOTE — Progress Notes (Signed)
PT Cancellation Note  Patient Details Name: [name] MRN: [id number] DOB: [birth year]   Cancelled Treatment:    Reason Eval/Treat Not Completed: Pain limiting ability to participate. Patient stated she could not walk at this time due to pain and lightheadedness. Will follow up this afternoon.    [name] 04/15/2014, 10:50 AM

## 2014-04-15 NOTE — Progress Notes (Signed)
PT Cancellation Note  Patient Details Name: [name] MRN: [id number] DOB: 1961/02/12   Cancelled Treatment:    Reason Eval/Treat Not Completed: Patient declined, no reason specified. Patient politely declining PT once more today. Patient stated that she had been up several times to the restroom and back and currently had visitors. Will check in on patient tomorrow.    Adline Potter Currie Dennin 04/15/2014, 1:52 PM

## 2014-04-16 ENCOUNTER — Inpatient Hospital Stay (HOSPITAL_COMMUNITY): Payer: 59

## 2014-04-16 MED ORDER — FERROUS SULFATE 325 (65 FE) MG PO TABS
325.0000 mg | ORAL_TABLET | Freq: Two times a day (BID) | ORAL | Status: DC
  Administered 2014-04-16 – 2014-04-18 (×4): 325 mg via ORAL
  Filled 2014-04-16 (×8): qty 1

## 2014-04-16 MED FILL — Sodium Chloride IV Soln 0.9%: INTRAVENOUS | Qty: 1000 | Status: AC

## 2014-04-16 MED FILL — Heparin Sodium (Porcine) Inj 1000 Unit/ML: INTRAMUSCULAR | Qty: 30 | Status: AC

## 2014-04-16 NOTE — Progress Notes (Signed)
Physical Therapy Treatment Patient Details Name: [name] MRN: [id number] DOB: 29-Jan-1961 Today'[name] Date: 04/16/2014    History of Present Illness 53 yr old female [name]/p L4-5 Transforaminal interbody fusion    PT Comments    Pt completed stair training today however ambulatory distance limited secondary to left calf pain (see general comments for further assessment.) Nurse notified. Her ambulatory distances have been progressing nicely until today. Will follow up in the AM for further gait training.   Follow Up Recommendations  Home health PT;Supervision for mobility/OOB     Equipment Recommendations  3in1 (PT)    Recommendations for Other Services OT consult     Precautions / Restrictions Precautions Precautions: Back Precaution Booklet Issued: Yes (comment) Precaution Comments: Pt able to state 2/3 back precautions. Reviewed precautions. Required Braces or Orthoses: Spinal Brace Restrictions Weight Bearing Restrictions: No Other Position/Activity Restrictions: No bending arching or twisting    Mobility  Bed Mobility Overal bed mobility: Needs Assistance Bed Mobility: Sidelying to Sit;Rolling Rolling: Modified independent (Device/Increase time) Sidelying to sit: Min guard       General bed mobility comments: VCs for technique, HOB flat, no physical assist needed. Requires extra time.  Transfers Overall transfer level: Needs assistance Equipment used: Rolling walker (2 wheeled) Transfers: Sit to/from Stand Sit to Stand: Min guard         General transfer comment: Min guard for safety with VCs for hand placement. Safely maintains back precautions  Ambulation/Gait Ambulation/Gait assistance: Supervision Ambulation Distance (Feet): 25 Feet Assistive device: Rolling walker (2 wheeled) Gait Pattern/deviations: Step-through pattern;Decreased stride length   Gait velocity interpretation: Below normal speed for age/gender General Gait Details: Small guarded  steps, limited by LLE pain this afternoon. Cues for forward gaze. Good control of RW.   Stairs Stairs: Yes Stairs assistance: Min guard Stair Management: One rail Left;Step to pattern;Sideways Number of Stairs: 2 (x2) General stair comments: Mild weakeness with RLE leading up step but able to maintain weight as she steps up. Min guard for safety. Demonstrated technique prior to pt practicing. She states she feels comfortable with this task.  Wheelchair Mobility    Modified Rankin (Stroke Patients Only)       Balance                                    Cognition Arousal/Alertness: Awake/alert Behavior During Therapy: WFL for tasks assessed/performed Overall Cognitive Status: Within Functional Limits for tasks assessed                      Exercises General Exercises - Lower Extremity Ankle Circles/Pumps: AROM;Both;10 reps;Seated    General Comments General comments (skin integrity, edema, etc.): Pt with significant left calf pain, tender to palpation, dorsiflexion, and knee extension in sitting. Reports it is worse with movement. However, no edema, erythema, or calor noted. She was tearful as this was assessed. Nurse Dedra Skeens was notified via telephone. - Pt with improved ability to don/doff spinal brace with minimal cues and assist today.      Pertinent Vitals/Pain 8/10 pain of back and left calf Nurse notified Patient repositioned in chair for comfort.     Home Living                      Prior Function            PT Goals (current goals can now be  found in the care plan section) Acute Rehab PT Goals Patient Stated Goal: not stated PT Goal Formulation: With patient Time For Goal Achievement: 04/21/14 Potential to Achieve Goals: Good Progress towards PT goals: Progressing toward goals    Frequency  Min 5X/week    PT Plan Current plan remains appropriate    Co-evaluation             End of Session Equipment Utilized During  Treatment: Back brace Activity Tolerance: Patient limited by pain Patient left: in bed;with call bell/phone within reach;with family/visitor present     Time: 1443-1510 PT Time Calculation (min): 27 min  Charges:  $Gait Training: 8-22 mins $Self Care/Home Management: 8-22                    G Codes:      [name], [name]  [name], [name] 04/16/2014, 4:32 PM

## 2014-04-16 NOTE — Progress Notes (Signed)
Patient on continuous pulse oximetry 

## 2014-04-16 NOTE — Progress Notes (Signed)
Subjective: 2 Days Post-Op Procedure(s) (LRB): Left L4-5 transforaminal lumbar interbody fusion, posterolateral fusion with pedicle screws, rods, cages and local bone graft (N/A) Patient reports pain as moderate.  Difficulty with pain control.  Pt using large amounts of percocet at home prior to surgery.  Tylenol agent discontinued yesterday.  Pt still anxious, but reassured about pain meds. OOB with PT and will prepare for discharge later this week when more stable. Productive cough this am. Asymptomatic with anemia  Objective: Vital signs in last 24 hours: Temp:  [98.7 F (37.1 C)-99.3 F (37.4 C)] 98.9 F (37.2 C) (06/10 0611) Pulse Rate:  [82-98] 82 (06/10 0611) Resp:  [16-18] 18 (06/10 0611) BP: (86-100)/(40-51) 97/47 mmHg (06/10 0611) SpO2:  [92 %-99 %] 99 % (06/10 0611)  Intake/Output from previous day: 06/09 0701 - 06/10 0700 In: 960 [P.O.:960] Out: 875 [Urine:875] Intake/Output this shift:     Recent Labs  04/15/14 0520 04/15/14 1533  HGB 8.7* 8.7*    Recent Labs  04/15/14 0520 04/15/14 1533  WBC 9.0  --   RBC 2.89*  --   HCT 26.2* 26.4*  PLT 178  --    No results found for this basename: NA, K, CL, CO2, BUN, CREATININE, GLUCOSE, CALCIUM,  in the last 72 hours No results found for this basename: LABPT, INR,  in the last 72 hours  ABD soft Neurovascular intact Sensation intact distally Dorsiflexion/Plantar flexion intact Incision: no drainage  Assessment/Plan: 2 Days Post-Op Procedure(s) (LRB): Left L4-5 transforaminal lumbar interbody fusion, posterolateral fusion with pedicle screws, rods, cages and local bone graft (N/A) Up with therapy Discharge home with home health when more stable CXR today  H&H in am Start iron supplement.  Jessup Ogas M 04/16/2014, 8:14 AM

## 2014-04-16 NOTE — Progress Notes (Signed)
Occupational Therapy Treatment Patient Details Name: [name] MRN: [id number] DOB: 1961/01/15 Today's Date: 04/16/2014    History of present illness 53 yr old female s/p L4-5 Transforaminal interbody fusion   OT comments  Pt seen for acute OT treatment session. Session limited by pain. Min A for rolling. Reviewed ADL and DME education, safety recommendations.   Follow Up Recommendations  Supervision/Assistance - 24 hour    Equipment Recommendations  3 in 1 bedside comode    Recommendations for Other Services      Precautions / Restrictions Precautions Precautions: Back Precaution Booklet Issued: Yes (comment) Restrictions Other Position/Activity Restrictions: No bending arching or twisting       Mobility Bed Mobility Overal bed mobility: Needs Assistance Bed Mobility: Rolling Rolling: Min assist         General bed mobility comments: Pt in supine upon arrival, requested assistance with rolling due to increased pain after [facility] and sitting in chair for a short time.  Transfers                      Balance                                   ADL                                         General ADL Comments: Reviewed techniques for LB dressing, use of 3n1 as BSC, over toilet and in shower.       Vision                     Perception     Praxis      Cognition     Overall Cognitive Status: Within Functional Limits for tasks assessed                       Extremity/Trunk Assessment               Exercises     Shoulder Instructions       General Comments      Pertinent Vitals/ Pain       8.5/10 pain in back. Pt c/o headache "off and on since yesterday morning" Nursing notified.  Home Living                                          Prior Functioning/Environment              Frequency Min 3X/week     Progress Toward Goals  OT Goals(current  goals can now be found in the care plan section)  Progress towards OT goals: Progressing toward goals  Acute Rehab OT Goals Patient Stated Goal: not stated OT Goal Formulation: With patient Time For Goal Achievement: 04/22/14 Potential to Achieve Goals: Good ADL Goals Pt Will Perform Grooming: with supervision;standing Pt Will Perform Lower Body Bathing: with supervision;with adaptive equipment;sit to/from stand Pt Will Perform Upper Body Dressing: with supervision;sitting Pt Will Transfer to Toilet: with supervision;ambulating Pt Will Perform Toileting - Clothing Manipulation and hygiene: with supervision;sit to/from stand Pt Will Perform Tub/Shower Transfer: with supervision;ambulating;3 in 1;rolling walker Additional ADL Goal #1: Pt will  complete bed mobility while observing back precautions at supervision level to prepare for OOB ADLS.  Plan Discharge plan remains appropriate    Co-evaluation                 End of Session     Activity Tolerance Patient limited by pain   Patient Left in bed;with call bell/phone within reach   Nurse Communication Patient requests pain meds        Time: 6440-3474 OT Time Calculation (min): 12 min  Charges: OT General Charges $OT Visit: 1 Procedure OT Treatments $Self Care/Home Management : 8-22 mins  Pilar Grammes 04/16/2014, 4:21 PM

## 2014-04-16 NOTE — Progress Notes (Signed)
Patient has high anxiety about pain management. RN reassured patient that she will administer pain medications to the patient with doctors parameters. Will continue to assess patients pain levels.

## 2014-04-17 DIAGNOSIS — E46 Unspecified protein-calorie malnutrition: Secondary | ICD-10-CM | POA: Diagnosis present

## 2014-04-17 DIAGNOSIS — E440 Moderate protein-calorie malnutrition: Secondary | ICD-10-CM | POA: Insufficient documentation

## 2014-04-17 DIAGNOSIS — D62 Acute posthemorrhagic anemia: Secondary | ICD-10-CM | POA: Diagnosis not present

## 2014-04-17 LAB — HEMOGLOBIN AND HEMATOCRIT, BLOOD
HEMATOCRIT: 24 % — AB (ref 36.0–46.0)
Hemoglobin: 8 g/dL — ABNORMAL LOW (ref 12.0–15.0)

## 2014-04-17 MED ORDER — OXYCODONE-ACETAMINOPHEN 5-325 MG PO TABS: 1.0000 | ORAL_TABLET | ORAL | Status: DC | PRN

## 2014-04-17 MED ORDER — PREDNISONE (PAK) 5 MG PO TABS
5.0000 mg | ORAL_TABLET | ORAL | Status: AC
  Administered 2014-04-17: 5 mg via ORAL

## 2014-04-17 MED ORDER — METHOCARBAMOL 500 MG PO TABS: 500.0000 mg | ORAL_TABLET | Freq: Four times a day (QID) | ORAL | Status: DC | PRN

## 2014-04-17 MED ORDER — PREDNISONE (PAK) 5 MG PO TABS: 10.0000 mg | ORAL_TABLET | Freq: Every evening | ORAL | Status: DC

## 2014-04-17 MED ORDER — OXYCODONE HCL ER 30 MG PO T12A: 30.0000 mg | EXTENDED_RELEASE_TABLET | Freq: Two times a day (BID) | ORAL | Status: DC

## 2014-04-17 MED ORDER — PREDNISONE (PAK) 5 MG PO TABS
10.0000 mg | ORAL_TABLET | Freq: Every morning | ORAL | Status: AC
  Administered 2014-04-17: 10 mg via ORAL
  Filled 2014-04-17: qty 21

## 2014-04-17 MED ORDER — IBUPROFEN 400 MG PO TABS
400.0000 mg | ORAL_TABLET | ORAL | Status: DC | PRN
  Filled 2014-04-17: qty 1

## 2014-04-17 MED ORDER — PREDNISONE (PAK) 5 MG PO TABS
5.0000 mg | ORAL_TABLET | Freq: Four times a day (QID) | ORAL | Status: DC
Start: 2014-04-19 — End: 2014-04-18

## 2014-04-17 MED ORDER — FERROUS SULFATE 325 (65 FE) MG PO TABS: 325.0000 mg | ORAL_TABLET | Freq: Two times a day (BID) | ORAL | Status: DC

## 2014-04-17 MED ORDER — ADULT MULTIVITAMIN W/MINERALS CH
1.0000 | ORAL_TABLET | Freq: Every day | ORAL | Status: DC
  Administered 2014-04-17 – 2014-04-18 (×2): 1 via ORAL
  Filled 2014-04-17 (×2): qty 1

## 2014-04-17 MED ORDER — DOCUSATE SODIUM 100 MG PO TABS: 100.0000 mg | ORAL_TABLET | Freq: Two times a day (BID) | ORAL | Status: DC

## 2014-04-17 MED ORDER — OXYCODONE-ACETAMINOPHEN 5-325 MG PO TABS
1.0000 | ORAL_TABLET | ORAL | Status: DC | PRN
  Administered 2014-04-17 – 2014-04-18 (×5): 2 via ORAL
  Filled 2014-04-17 (×5): qty 2

## 2014-04-17 MED ORDER — ENSURE COMPLETE PO LIQD
237.0000 mL | Freq: Two times a day (BID) | ORAL | Status: DC
  Administered 2014-04-17 – 2014-04-18 (×3): 237 mL via ORAL

## 2014-04-17 MED ORDER — OXYCODONE HCL ER 20 MG PO T12A
30.0000 mg | EXTENDED_RELEASE_TABLET | Freq: Two times a day (BID) | ORAL | Status: DC
  Administered 2014-04-17: 30 mg via ORAL
  Filled 2014-04-17 (×2): qty 1

## 2014-04-17 MED ORDER — PREDNISONE (PAK) 5 MG PO TABS
10.0000 mg | ORAL_TABLET | Freq: Every evening | ORAL | Status: AC
  Administered 2014-04-17: 10 mg via ORAL

## 2014-04-17 MED ORDER — PREDNISONE (PAK) 5 MG PO TABS
5.0000 mg | ORAL_TABLET | Freq: Three times a day (TID) | ORAL | Status: DC
  Administered 2014-04-18 (×2): 5 mg via ORAL

## 2014-04-17 NOTE — Progress Notes (Signed)
Patient examined and lab reviewed with Vernon PA-C. 

## 2014-04-17 NOTE — Progress Notes (Signed)
Agree with intern assessment. Pt with high protein needs of 80-85 grams daily due to medical conditions.  Ian Malkineanne Barnett RD, LDN Inpatient Clinical Dietitian Pager: [phone] After Hours Pager: [phone]

## 2014-04-17 NOTE — Progress Notes (Signed)
INITIAL NUTRITION ASSESSMENT  DOCUMENTATION CODES Per approved criteria  -Non-severe (moderate) malnutrition in the context of chronic illness   Pt meets criteria for non-severe (moderate) MALNUTRITION in the context of chronic illness as evidenced by weight loss of 10% x 6 months and energy intake < 75% of estimated needs >/= 1 month.   INTERVENTION:  Ensure Complete po BID, each supplement providing 350 kcals and 13 grams of protein   Snacks Daily   Daily multivitamin    RD to continue to follow nutrition care plan   NUTRITION DIAGNOSIS: Inadequate oral intake related to poor appetite, chronic back pain as evidenced by pt report of consuming < 50% of meals x 4 months.   Goal: Pt to meet >/=90% of estimated nutrition needs   Monitor:  PO intake, supplement acceptance, weight trend, labs   Reason for Assessment: Positive Malnutrition Screening Tool Score   53 y.o. female  Admitting Dx: Spinal stenosis, lumbar region, with neurogenic claudication  ASSESSMENT: Pt is a 53 y.o female who presented for surgery on 6/8 with diagnosis of L4-5 painful spondylolisthesis.   Pt is s/p the following on 6/8:  Left L4-5 transforaminal lumbar interbody fusion, posterolateral fusion with pedicle screws, rods, cages and local bone graft  Pt reports that she has not had a good appetite for years, but since February she has had very poor PO intake. She states that she might eat 1 meal a day but mostly snacks throughout the day.   Pt reports that her normal weight is 154 lbs and last weighed this in February 2015. She has since lost weight. Per Epic chart records, pt has lost 10% x 6 months, which is clinically significant.   Nutrition focused physical exam performed. No muscle or subcutaneous fat depletion noticed.   Slightly elevated glucose   Height: Ht Readings from Last 1 Encounters:  04/10/14 5' 4.5" (1.638 m)    Weight: Wt Readings from Last 1 Encounters:  04/14/14 127 lb  (57.607 kg)    Ideal Body Weight: 55.6 kg   % Ideal Body Weight: 104%   Wt Readings from Last 10 Encounters:  04/14/14 127 lb (57.607 kg)  04/14/14 127 lb (57.607 kg)  04/10/14 127 lb 3.2 oz (57.698 kg)  01/01/14 135 lb (61.236 kg)  01/01/14 135 lb (61.236 kg)  12/25/13 135 lb 1.6 oz (61.281 kg)  12/20/13 138 lb (62.596 kg)  11/04/13 141 lb (63.957 kg)  11/04/13 141 lb (63.957 kg)  10/20/13 150 lb (68.04 kg)    Usual Body Weight: 154 lbs, per pt   % Usual Body Weight: 82%   BMI:  Body mass index is 21.47 kg/(m^2)., Normal   Estimated Nutritional Needs: Kcal: 1450 - 1650  Protein: 65 - 75 grams  Fluid: >/= 1.5 L/day   Skin: surgical incision back and neck   Diet Order: Carb Control  EDUCATION NEEDS: -No education needs identified at this time   Intake/Output Summary (Last 24 hours) at 04/17/14 0954 Last data filed at 04/16/14 1700  Gross per 24 hour  Intake    480 ml  Output      0 ml  Net    480 ml    Last BM: 6/8    Labs:   Recent Labs Lab 04/10/14 1130  NA 142  K 4.2  CL 103  CO2 31  BUN 6  CREATININE 0.71  CALCIUM 9.9  GLUCOSE 103*    CBG (last 3)  No results found for this basename: GLUCAP,  in the last 72 hours  Scheduled Meds: . aspirin EC  81 mg Oral QODAY  . citalopram  40 mg Oral Daily  . clonazePAM  0.5 mg Oral BID  . docusate sodium  100 mg Oral BID  . famotidine  20 mg Oral BID  . ferrous sulfate  325 mg Oral BID WC  . furosemide  20 mg Oral Daily  . levothyroxine  125 mcg Oral Once per day on Sun Tue Wed Fri Sat  . levothyroxine  187.5 mcg Oral Once per day on Mon Thu  . lisinopril  40 mg Oral Daily  . mometasone-formoterol  2 puff Inhalation BID  . mupirocin ointment  1 application Nasal BID  . OxyCODONE  20 mg Oral Q12H  . sodium chloride  3 mL Intravenous Q12H    Continuous Infusions: . sodium chloride    . sodium chloride 100 mL/hr at 04/16/14 2144    Past Medical History  Diagnosis Date  . Gastritis   .  Fibromyalgia   . Edema of lower extremity   . DDD (degenerative disc disease)   . MS (multiple sclerosis)     early stages  . Neuropathy   . COPD (chronic obstructive pulmonary disease)   . Hiatal hernia   . Gout   . TIA (transient ischemic attack)     Hx: of  . Asthma     Albuterol prn   . Depression     takes Celexa,Clonazepam daily  . Muscle spasm     takes Flexeril daily as needed  . GERD (gastroesophageal reflux disease)     takes Pepcid daily  . CHF (congestive heart failure)     takes Furosemide daily  . Hypothyroidism     takes Synthroid daily  . HTN (hypertension)     takes lisinopril daily  . Complication of anesthesia     hard to wake up  . PONV (postoperative nausea and vomiting)   . Sleep apnea     sleep study done at least 6877yrs ago;has CPAP but doesn't use  . Hyperlipidemia     but doesn't take any meds  . Phlebitis   . Emphysema lung   . Shortness of breath     with exertion or stressed   . Pneumonia     hx of;last time in 2014  . History of bronchitis     last time 4-595months ago   . Migraine     has one now  . Migraine   . Stroke     left sided weakness  . Joint pain   . Joint swelling   . Chronic back pain     DDD/scoliosis  . Osteoporosis   . Hemorrhoids   . History of colon polyps   . Urinary frequency   . History of kidney stones     is in her left kidney and has been for couple of yrs and no problems  . Diabetes mellitus     borderline  . Insomnia     doesn't take any meds for this    Past Surgical History  Procedure Laterality Date  . Fundic gland polyp      benign  . Sigmoidoscopy  01/31/08    large internal hemorrhoids/small rectal polyp removed/rare sigmoid diverticula  . Esophagogastroduodenoscopy  10/29/07    normal  . Cholecystectomy    . Bladder suspension    . Abdominal hysterectomy    . Carpal tunnel release Right   . Cardiac catheterization    .  Colonoscopy    . Anterior cervical decomp/discectomy fusion N/A  12/30/2013    Procedure: ANTERIOR CERVICAL DISCECTOMY FUSION C4-5, C5-6, plate and screws, allograft, local bone graft;  Surgeon: Kerrin Champagne, MD;  Location: [facility] OR;  Service: Orthopedics;  Laterality: N/A;    Eppie Gibson, BS Dietetic Intern Pager: [phone]

## 2014-04-17 NOTE — Progress Notes (Signed)
Patient ID: [name], female   DOB: [birth year], 53 y.o.   MRN: [id number] Subjective: 3 Days Post-Op Procedure(s) (LRB): Left L4-5 transforaminal lumbar interbody fusion, posterolateral fusion with pedicle screws, rods, cages and local bone graft (N/A) Awake, alert and oriented x 4. Complains of back and left leg discomfort. Pain began yesterday. Limits her walking tolerance. She Has been taking large quantities of tylenol at home with her percocet 2 x 325 mg tylenol ever four hours or 3900 mg per day. Worrisome For concerns of liver dysfunction from excess tylenol.  Patient reports pain as marked.    Objective:   VITALS:  Temp:  [99 F (37.2 C)] 99 F (37.2 C) (06/11 0620) Pulse Rate:  [92-98] 92 (06/11 0620) Resp:  [18] 18 (06/11 0620) BP: (110-112)/(47-50) 112/50 mmHg (06/11 0620) SpO2:  [98 %-99 %] 98 % (06/11 0957)  Neurologically intact ABD soft Sensation intact distally Dorsiflexion/Plantar flexion intact Incision: no drainage No cellulitis present Compartment soft Pain into the left calf, generally into the posterior calf not consistent with the level of surgery   LABS  Recent Labs  04/15/14 0520 04/15/14 1533 04/17/14 0600  HGB 8.7* 8.7* 8.0*  WBC 9.0  --   --   PLT 178  --   --    No results found for this basename: NA, K, CL, CO2, BUN, CREATININE, GLUCOSE,  in the last 72 hours No results found for this basename: LABPT, INR,  in the last 72 hours CXR: negative of acute changes.  Assessment/Plan: 3 Days Post-Op Procedure(s) (LRB): Left L4-5 transforaminal lumbar interbody fusion, posterolateral fusion with pedicle screws, rods, cages and local bone graft (N/A) Mild malnutrition per nutritionalist Periop acute blood loss anemia check orthostatic BP. Left calf pain r/o DVT.   Advance diet Up with therapy Plan for discharge tomorrow Discharge home with home health Fe replacement. Check orthostatic BPs to r/o orthostatic hypotension due to  anemia. Venous duplex u/s to r/o DVT. Incentive spirometry.   [name] 04/17/2014, 1:04 PM

## 2014-04-17 NOTE — Progress Notes (Signed)
Physical Therapy Treatment Patient Details Name: [name] MRN: [id number] DOB: [birth year] Today's Date: 04/17/2014    History of Present Illness 53 yr old female s/p L4-5 Transforaminal interbody fusion    PT Comments    Pt progressing slowly towards goals, ambulatory distance limited by left calf pain that now radiates to left groin. Nurse notified. Will continue to work with pt as she is able. Already completed stair training, and therefore we will continue to focus on gait training and improving independence with functional mobility.   Follow Up Recommendations  Home health PT;Supervision for mobility/OOB     Equipment Recommendations  3in1 (PT)    Recommendations for Other Services OT consult     Precautions / Restrictions Precautions Precautions: Back Required Braces or Orthoses: Spinal Brace Restrictions Weight Bearing Restrictions: No Other Position/Activity Restrictions: No bending arching or twisting    Mobility  Bed Mobility Overal bed mobility: Needs Assistance Bed Mobility: Sidelying to Sit;Rolling Rolling: Modified independent (Device/Increase time) Sidelying to sit: Supervision       General bed mobility comments: VCs for technique, HOB flat, no physical assist needed. Requires extra time.  Transfers Overall transfer level: Needs assistance Equipment used: Rolling walker (2 wheeled) Transfers: Sit to/from Stand Sit to Stand: Min guard         General transfer comment: Min guard for safety with VCs for hand placement. Safely maintains back precautions. Performed from lowest bed setting and BSC.  Ambulation/Gait Ambulation/Gait assistance: Supervision Ambulation Distance (Feet): 15 Feet Assistive device: Rolling walker (2 wheeled) Gait Pattern/deviations: Step-through pattern;Decreased stride length   Gait velocity interpretation: Below normal speed for age/gender General Gait Details: Good stability with no loss of balance. ambulated to  and from rest room with one seated rest break on BSC. Limited due to LLE pain. VCs to not lift RW and keep within BOS during turning   Stairs            Wheelchair Mobility    Modified Rankin (Stroke Patients Only)       Balance                                    Cognition Arousal/Alertness: Awake/alert Behavior During Therapy: WFL for tasks assessed/performed Overall Cognitive Status: Within Functional Limits for tasks assessed                      Exercises      General Comments General comments (skin integrity, edema, etc.): Continues to have left calf pain that pt reports is now present in left groin. Appears to have some swelling in LLE today as well. Pt reports MD has decided to order a doppler. Spent additional time practicing donning/doffing brace as pt needs min assist to complete this task      Pertinent Vitals/Pain Pt reports pain as "high" no numerical value given Patient repositioned in chair for comfort.     Home Living                      Prior Function            PT Goals (current goals can now be found in the care plan section) Acute Rehab PT Goals PT Goal Formulation: With patient Time For Goal Achievement: 04/21/14 Potential to Achieve Goals: Good Progress towards PT goals: Progressing toward goals    Frequency  Min 5X/week  PT Plan Current plan remains appropriate    Co-evaluation             End of Session Equipment Utilized During Treatment: Back brace Activity Tolerance: Patient limited by pain Patient left: with call bell/phone within reach;in chair     Time: 1101-1119 PT Time Calculation (min): 18 min  Charges:  $Gait Training: 8-22 mins                    G Codes:      [employer] [employer] Khya Halls, [place]PT [phone]  Berton MountBarbour, Timira Bieda S 04/17/2014, 11:49 AM

## 2014-04-18 DIAGNOSIS — M7989 Other specified soft tissue disorders: Secondary | ICD-10-CM

## 2014-04-18 DIAGNOSIS — M79609 Pain in unspecified limb: Secondary | ICD-10-CM

## 2014-04-18 LAB — CBC WITH DIFFERENTIAL/PLATELET
Basophils Absolute: 0 10*3/uL (ref 0.0–0.1)
Basophils Relative: 0 % (ref 0–1)
EOS PCT: 0 % (ref 0–5)
Eosinophils Absolute: 0 10*3/uL (ref 0.0–0.7)
HCT: 25.8 % — ABNORMAL LOW (ref 36.0–46.0)
Hemoglobin: 8.8 g/dL — ABNORMAL LOW (ref 12.0–15.0)
LYMPHS PCT: 10 % — AB (ref 12–46)
Lymphs Abs: 0.5 10*3/uL — ABNORMAL LOW (ref 0.7–4.0)
MCH: 30.9 pg (ref 26.0–34.0)
MCHC: 34.1 g/dL (ref 30.0–36.0)
MCV: 90.5 fL (ref 78.0–100.0)
Monocytes Absolute: 0.2 10*3/uL (ref 0.1–1.0)
Monocytes Relative: 5 % (ref 3–12)
NEUTROS ABS: 3.9 10*3/uL (ref 1.7–7.7)
Neutrophils Relative %: 85 % — ABNORMAL HIGH (ref 43–77)
PLATELETS: 163 10*3/uL (ref 150–400)
RBC: 2.85 MIL/uL — ABNORMAL LOW (ref 3.87–5.11)
RDW: 12.9 % (ref 11.5–15.5)
WBC: 4.6 10*3/uL (ref 4.0–10.5)

## 2014-04-18 MED ORDER — OXYCODONE HCL ER 20 MG PO T12A
20.0000 mg | EXTENDED_RELEASE_TABLET | Freq: Two times a day (BID) | ORAL | Status: DC
  Administered 2014-04-18: 20 mg via ORAL
  Filled 2014-04-18: qty 1

## 2014-04-18 MED ORDER — OXYCODONE HCL 15 MG PO TABS: 15.0000 mg | ORAL_TABLET | ORAL | Status: DC | PRN

## 2014-04-18 MED ORDER — ONDANSETRON HCL 4 MG PO TABS
4.0000 mg | ORAL_TABLET | Freq: Four times a day (QID) | ORAL | Status: DC | PRN
  Administered 2014-04-18 (×2): 4 mg via ORAL
  Filled 2014-04-18 (×2): qty 1

## 2014-04-18 MED ORDER — OXYCODONE HCL ER 20 MG PO T12A: 20.0000 mg | EXTENDED_RELEASE_TABLET | Freq: Two times a day (BID) | ORAL | Status: DC

## 2014-04-18 MED ORDER — ONDANSETRON HCL 4 MG PO TABS: 4.0000 mg | ORAL_TABLET | Freq: Once | ORAL | Status: DC

## 2014-04-18 MED ORDER — OXYCODONE HCL 5 MG PO TABS
15.0000 mg | ORAL_TABLET | ORAL | Status: DC | PRN
  Administered 2014-04-18: 15 mg via ORAL
  Filled 2014-04-18: qty 3

## 2014-04-18 NOTE — Progress Notes (Signed)
*  Preliminary Results* Bilateral lower extremity venous duplex completed. Bilateral lower extremities are negative for deep vein thrombosis. There is no evidence of Baker's cyst bilaterally.  04/18/2014  Gertie Fey, RVT, RDCS, RDMS

## 2014-04-18 NOTE — Progress Notes (Signed)
Patient stated that she had fallen yesterday. Pt said that she was trying to get to the bathroom because she could not wait any longer for someone to help her. Pt stated that she sat down on her hands and her pain was not any more than it has been. Patient stated that "she told the girl that she fell". MD was unaware of her falling. Will continue to monitor.

## 2014-04-18 NOTE — Care Management (Signed)
CARE MANAGEMENT NOTE 04/18/2014  Patient:  [name],[name]   Account Number:  [id number]  Date Initiated:  04/14/2014  Documentation initiated by:  Vance PeperBRADY,Oluwatimilehin Balfour  Subjective/Objective Assessment:   53 yr old female s/p L4-5 Transforaminal interbody fusion.     Action/Plan:   PT/OT eval  Case manager will continue to monitor.  Case manager spoke with patient concerning HH and DME needs at discharge. Choice offered.[facility].   Anticipated DC Date:  04/18/2014   Anticipated DC Plan:  HOME W HOME HEALTH SERVICES      DC Planning Services  CM consult      [facility]  HOME HEALTH  DURABLE MEDICAL EQUIPMENT   Choice offered to / List presented to:  C-1 Patient   DME arranged  3-N-1      DME agency  [facility]     HH arranged  HH-2 PT      [facility]   Status of service:  Completed, signed off Medicare Important Message given?   (If response is "NO", the following Medicare IM given date fields will be blank) Date Medicare IM given:   Date Additional Medicare IM given:    Discharge Disposition:  HOME W HOME HEALTH SERVICES  Per UR Regulation:  Reviewed for med. necessity/level of care/duration of stay  If discussed at Long Length of Stay Meetings, dates discussed:    Comments:

## 2014-04-18 NOTE — Progress Notes (Signed)
Occupational Therapy Treatment Patient Details Name: Nichole HeinzBecky L Robleto MRN: [id number] DOB: 04/15/1961 Today's Date: 04/18/2014    History of present illness 53 yr old female s/p L4-5 Transforaminal interbody fusion   OT comments  Pt continues to make good progress.  She requires min guard assist for LB ADLs and min A for tub transfers.   Follow Up Recommendations  Supervision/Assistance - 24 hour    Equipment Recommendations  3 in 1 bedside comode    Recommendations for Other Services      Precautions / Restrictions Precautions Precautions: Back Precaution Comments: Pt able to state 3/3 back precautions Required Braces or Orthoses: Spinal Brace Cervical Brace: Hard collar;At all times Spinal Brace: Applied in sitting position       Mobility Bed Mobility Overal bed mobility: Needs Assistance Bed Mobility: Sidelying to Sit;Rolling;Sit to Sidelying Rolling: Modified independent (Device/Increase time) Sidelying to sit: Supervision     Sit to sidelying: Supervision General bed mobility comments: Pt requires mod verbal cues for correct technique  Transfers Overall transfer level: Needs assistance Equipment used: Rolling walker (2 wheeled) Transfers: Sit to/from [employer] CorporationStand;Stand Pivot Transfers Sit to Stand: Min guard Stand pivot transfers: Min guard            Balance Overall balance assessment: Needs assistance Sitting-balance support: Feet supported Sitting balance-Leahy Scale: Good     Standing balance support: Single extremity supported Standing balance-Leahy Scale: Poor                     ADL               Lower Body Bathing: Supervison/ safety;Sit to/from stand       Lower Body Dressing: Supervision/safety;Sit to/from stand   Toilet Transfer: Supervision/safety;Ambulation;Comfort height toilet   Toileting- Clothing Manipulation and Hygiene: Supervision/safety;Sit to/from stand   Tub/ Shower Transfer: Minimal assistance;Ambulation;3 in  1;Rolling walker Tub/Shower Transfer Details (indicate cue type and reason): Pt instructed on how to use 3in1 in tub and handout provided.  She requires min A for balance Functional mobility during ADLs: Minimal assistance General ADL Comments: reviewed proper techniques for LB ADLs, toileting, and tub transfers.  Pt moves very slowly       Vision                     Perception     Praxis      Cognition   Behavior During Therapy: Anxious Overall Cognitive Status: Within Functional Limits for tasks assessed                       Extremity/Trunk Assessment               Exercises     Shoulder Instructions       General Comments      Pertinent Vitals/ Pain       See vitals flow sheet.   Home Living Family/patient expects to be discharged to:: Private residence Living Arrangements: Spouse/significant other;Other relatives;Children                                      Prior Functioning/Environment              Frequency Min 3X/week     Progress Toward Goals  OT Goals(current goals can now be found in the care plan section)  Progress towards OT goals: Progressing toward goals  ADL Goals Pt Will Perform Grooming: with supervision;standing Pt Will Perform Lower Body Bathing: with supervision;with adaptive equipment;sit to/from stand Pt Will Perform Upper Body Dressing: with supervision;sitting Pt Will Transfer to Toilet: with supervision;ambulating Pt Will Perform Toileting - Clothing Manipulation and hygiene: with supervision;sit to/from stand Pt Will Perform Tub/Shower Transfer: with supervision;ambulating;3 in 1;rolling walker Additional ADL Goal #1: Pt will complete bed mobility while observing back precautions at supervision level to prepare for OOB ADLS.  Plan Discharge plan remains appropriate    Co-evaluation                 End of Session Equipment Utilized During Treatment: Back brace;Rolling walker    Activity Tolerance Patient tolerated treatment well   Patient Left in bed;with call bell/phone within reach;with bed alarm set   Nurse Communication Mobility status        Time: 1051-1140 OT Time Calculation (min): 49 min  Charges: OT General Charges $OT Visit: 1 Procedure OT Treatments $Self Care/Home Management : 38-52 mins  Jacob Chamblee M 04/18/2014, 2:05 PM

## 2014-04-18 NOTE — Progress Notes (Signed)
Patient ID: [name], female   DOB: 1961/06/25, 53 y.o.   MRN: [id number] Subjective: 4 Days Post-Op Procedure(s) (LRB): Left L4-5 transforaminal lumbar interbody fusion, posterolateral fusion with pedicle screws, rods, cages and local bone graft (N/A) Awake, alert and oriented x 4. Complains of pain. Increase in oxycontin to 30mg  gave her nausea. Started a medrol dose pak.  Patient reports pain as marked.    Objective:   VITALS:  Temp:  [98.1 F (36.7 C)-98.7 F (37.1 C)] 98.7 F (37.1 C) (06/12 0610) Pulse Rate:  [72-77] 72 (06/12 0610) Resp:  [18] 18 (06/12 0610) BP: (106-108)/(57-58) 106/58 mmHg (06/12 0610) SpO2:  [96 %-99 %] 99 % (06/12 0610)  Neurologically intact ABD soft Neurovascular intact Sensation intact distally Intact pulses distally Dorsiflexion/Plantar flexion intact Incision: no drainage No cellulitis present   LABS  Recent Labs  04/15/14 1533 04/17/14 0600  HGB 8.7* 8.0*   No results found for this basename: NA, K, CL, CO2, BUN, CREATININE, GLUCOSE,  in the last 72 hours No results found for this basename: LABPT, INR,  in the last 72 hours   Assessment/Plan: 4 Days Post-Op Procedure(s) (LRB): Left L4-5 transforaminal lumbar interbody fusion, posterolateral fusion with pedicle screws, rods, cages and local bone graft (N/A)  Advance diet Up with therapy D/C IV fluids Discharge home with home health today Adjust percocet to 7.5 mg from 5 mg and decrease oxycontin to 20mg  every 12 hours. There is no sign of acute change, expect discomfort due to large percocet use pre admit. Check lab pending this am and check the doppler U/S ordered yesterday on this patient.  Jameel Quant E 04/18/2014, 6:50 AM

## 2014-04-21 NOTE — Discharge Summary (Signed)
Physician Discharge Summary  Patient ID: [name] MRN: [id number] DOB/AGE: 03/19/1961 53 y.o.  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 Gastritis   . Fibromyalgia   . Edema of lower extremity   . DDD (degenerative disc disease)   . MS (multiple sclerosis)     early stages  . Neuropathy   . COPD (chronic obstructive pulmonary disease)   . Hiatal hernia   . Gout   . TIA (transient ischemic attack)     Hx: of  . Asthma     Albuterol prn   . Depression     takes Celexa,Clonazepam daily  . Muscle spasm     takes Flexeril daily as needed  . GERD (gastroesophageal reflux disease)     takes Pepcid daily  . CHF (congestive heart failure)     takes Furosemide daily  . Hypothyroidism     takes Synthroid daily  . HTN (hypertension)     takes lisinopril daily  . Complication of anesthesia     hard to wake up  . PONV (postoperative nausea and vomiting)   . Sleep apnea     sleep study done at least 51yrs ago;has CPAP but doesn't use  . Hyperlipidemia     but doesn't take any meds  . Phlebitis   . Emphysema lung   . Shortness of breath     with exertion or stressed   . Pneumonia     hx of;last time in 2014  . History of bronchitis     last time 4-75months ago   . Migraine     has one now  . Migraine   . Stroke     left sided weakness  . Joint pain   . Joint swelling   . Chronic back pain     DDD/scoliosis  . Osteoporosis   . Hemorrhoids   . History of colon polyps   . Urinary frequency   . History of kidney stones     is in her left kidney and has been for couple of yrs  and no problems  . Diabetes mellitus     borderline  . Insomnia     doesn't take any meds for this    Surgeries: Procedure(s): Left L4-5 transforaminal lumbar interbody fusion, posterolateral fusion with pedicle screws, rods, cages and local bone graft on 04/14/2014   Consultants (if any):  none  Discharged Condition: Improved  Hospital Course: [name] is an 53 y.o. female who was admitted 04/14/2014 with a diagnosis of Spinal stenosis, lumbar region, with neurogenic claudication and went to the operating room on 04/14/2014 and underwent the above named procedures.    She was given perioperative antibiotics:      Anti-infectives   Start     Dose/Rate Route Frequency Ordered Stop   04/14/14 0600  clindamycin (CLEOCIN) IVPB 900 mg     900 mg 100 mL/hr over 30 Minutes Intravenous On call to O.R. 04/13/14 1228 04/14/14 0748   04/14/14 0600  vancomycin (VANCOCIN) IVPB 1000 mg/200 mL premix  Status:  Discontinued     1,000 mg 200 mL/hr over 60 Minutes  Intravenous On call to O.R. 04/13/14 1228 04/14/14 1257    Pt with uncontrolled pain requiring Oxycontin to be added to her medications.  At 30mg  of Oxycontin the pt developed nausea, therefor the dose was maintained at oxycontin 20mg  and hydrocodone 7.5 mg added for breakthru pain. Left calf pain was assessed by doppler study which was negative. CXR was negative and IS used for pulmonary toilet. Acute blood loss anemia was treated with iron supplement.  She was given sequential compression devices, early ambulation for DVT prophylaxis.  She benefited maximally from the hospital stay and there were no complications.    Recent vital signs:  Filed Vitals:   04/18/14 1306  BP: 113/46  Pulse: 88  Temp: 98.2 F (36.8 C)  Resp: 18    Recent laboratory studies:  Lab Results  Component Value Date   HGB 8.8* 04/18/2014   HGB 8.0* 04/17/2014   HGB 8.7* 04/15/2014   Lab Results  Component Value Date   WBC 4.6 04/18/2014   PLT 163  04/18/2014   Lab Results  Component Value Date   INR 1.0 01/14/2009   Lab Results  Component Value Date   NA 142 04/10/2014   K 4.2 04/10/2014   CL 103 04/10/2014   CO2 31 04/10/2014   BUN 6 04/10/2014   CREATININE 0.71 04/10/2014   GLUCOSE 103* 04/10/2014    Discharge Medications:     Medication List    STOP taking these medications       mupirocin ointment 2 %  Commonly known as:  BACTROBAN     oxyCODONE-acetaminophen 5-325 MG per tablet  Commonly known as:  ROXICET      TAKE these medications       albuterol 108 (90 BASE) MCG/ACT inhaler  Commonly known as:  PROVENTIL HFA;VENTOLIN HFA  Inhale 2 puffs into the lungs every 6 (six) hours as needed. Shortness of breath.     aspirin EC 81 MG tablet  Take 81 mg by mouth every other day.     citalopram 40 MG tablet  Commonly known as:  CELEXA  Take 40 mg by mouth daily.     clonazePAM 0.5 MG tablet  Commonly known as:  KLONOPIN  Take 0.5 mg by mouth 2 (two) times daily.     Docusate Sodium 100 MG capsule  Commonly known as:  STOOL SOFTENER  Take 1 tablet (100 mg total) by mouth 2 (two) times daily.     famotidine 20 MG tablet  Commonly known as:  PEPCID  Take 1 tablet (20 mg total) by mouth 2 (two) times daily.     ferrous sulfate 325 (65 FE) MG tablet  Take 1 tablet (325 mg total) by mouth 2 (two) times daily with a meal.     Fluticasone-Salmeterol 250-50 MCG/DOSE Aepb  Commonly known as:  ADVAIR  Inhale 1 puff into the lungs 2 (two) times daily as needed (shortness of breath).     furosemide 20 MG tablet  Commonly known as:  LASIX  Take 20 mg by mouth daily.     levothyroxine 125 MCG tablet  Commonly known as:  SYNTHROID, LEVOTHROID  - Take 125-187.5 mcg by mouth See admin instructions. Take 1 tablet (125 mcg total) on Sun Tue Wed Fri Sat.  - 1.5 tabs (187.36mcg) on Monday and Thursday     lisinopril 40 MG tablet  Commonly known as:  PRINIVIL,ZESTRIL  Take 40 mg by mouth daily.     methocarbamol 500 MG tablet  Commonly known as:  ROBAXIN  Take 1 tablet (500 mg total) by mouth every 6 (six) hours as needed for muscle spasms.     OxyCODONE 20 mg T12a 12 hr tablet  Commonly known as:  OXYCONTIN  Take 1 tablet (20 mg total) by mouth every 12 (twelve) hours.     oxyCODONE 15 MG immediate release tablet  Commonly known as:  ROXICODONE  Take 1 tablet (15 mg total) by mouth every 4 (four) hours as needed for severe pain or breakthrough pain (prn break through pain).        Diagnostic Studies: Dg Chest 2 View  04/16/2014   CLINICAL DATA:  Cough.  Fever.  Postop.  EXAM: CHEST  2 VIEW  COMPARISON:  10/20/2013  FINDINGS: Cardiac silhouette is normal in size and configuration. Normal mediastinal and hilar contours.  Lungs are clear.  No pleural effusion.  No pneumothorax.  Bony thorax is demineralized but intact. There has been a previous low anterior cervical spine fusion.  IMPRESSION: No active cardiopulmonary disease.   Electronically Signed   By: Amie Portlandavid  Ormond M.D.   On: 04/16/2014 09:50   Dg Lumbar Spine Complete  04/14/2014   CLINICAL DATA:  L4-5 TLIF.  EXAM: DG C-ARM 61-120 MIN; LUMBAR SPINE - COMPLETE 4+ VIEW  COMPARISON:  09/25/2013  FINDINGS: Multiple images are submitted, showing posterior fusion with interbody fusion device at L4-5. Numbering is based on previous CT myelogram.  IMPRESSION: Posterior fusion at L4-5.   Electronically Signed   By: Rosalie GumsBeth  Brown M.D.   On: 04/14/2014 11:24   Dg C-arm 61-120 Min  04/14/2014   CLINICAL DATA:  L4-5 TLIF.  EXAM: DG C-ARM 61-120 MIN; LUMBAR SPINE - COMPLETE 4+ VIEW  COMPARISON:  09/25/2013  FINDINGS: Multiple images are submitted, showing posterior fusion with interbody fusion device at L4-5. Numbering is based on previous CT myelogram.  IMPRESSION: Posterior fusion at L4-5.   Electronically Signed   By: Rosalie GumsBeth  Brown M.D.   On: 04/14/2014 11:24    Disposition: 01-Home or Self Care  Discharge Instructions   Call MD / Call 911    Complete by:  As directed    If you experience chest pain or shortness of breath, CALL 911 and be transported to the hospital emergency room.  If you develope a fever above 101 F, pus (white drainage) or increased drainage or redness at the wound, or calf pain, call your surgeon's office.     Constipation Prevention    Complete by:  As directed   Drink plenty of fluids.  Prune juice may be helpful.  You may use a stool softener, such as Colace (over the counter) 100 mg twice a day.  Use MiraLax (over the counter) for constipation as needed.     Diet - low sodium heart healthy    Complete by:  As directed      Discharge instructions    Complete by:  As directed   Call if there is increasing drainage, fever greater than 101.5, severe head aches, and worsening nausea or light sensitivity. If shortness of breath, bloody cough or chest tightness or pain go to an emergency room. No lifting greater than 10 lbs. Avoid bending, stooping and twisting. Use brace when sitting and out of bed even to go to bathroom. Walk in house for first 2 weeks then may start to get out slowly increasing distances up to one mile by 4-6 weeks post op. After 5 days may shower and change dressing  following bathing with shower.When bathing remove the brace shower and replace brace before getting out of the shower. If drainage, keep dry dressing and do not bathe the incision, use an moisture impervious dressing. Please call and return for scheduled follow up appointment 2 weeks from the time of surgery.     Driving restrictions    Complete by:  As directed   No driving for 4 weeks     Increase activity slowly as tolerated    Complete by:  As directed      Lifting restrictions    Complete by:  As directed   No lifting for 8 weeks           Follow-up Information   Follow up with NITKA,JAMES E, MD. Schedule an appointment as soon as possible for a visit in 2 weeks.   Specialty:  Orthopedic Surgery   Contact information:   [address] [place] [address] [phone]       Follow up with [facility]. (Someone from [facility] will contact you concerning start date and time for physical therapy.)    Contact information:   [address] [place] [address] [phone]        Signed: Wende Neighbors 04/21/2014, 1:28 PM

## 2014-11-11 ENCOUNTER — Ambulatory Visit (HOSPITAL_COMMUNITY): Payer: Self-pay | Admitting: Psychiatry

## 2014-11-15 ENCOUNTER — Observation Stay (HOSPITAL_COMMUNITY): Payer: 59

## 2014-11-15 ENCOUNTER — Encounter (HOSPITAL_COMMUNITY): Payer: Self-pay | Admitting: *Deleted

## 2014-11-15 ENCOUNTER — Inpatient Hospital Stay (HOSPITAL_COMMUNITY)
Admission: EM | Admit: 2014-11-15 | Discharge: 2014-11-20 | DRG: 392 | Disposition: A | Discharge status: Home / Self Care | Payer: 59 | Attending: Internal Medicine | Admitting: Internal Medicine

## 2014-11-15 DIAGNOSIS — A09 Infectious gastroenteritis and colitis, unspecified: Principal | ICD-10-CM | POA: Diagnosis present

## 2014-11-15 DIAGNOSIS — D72829 Elevated white blood cell count, unspecified: Secondary | ICD-10-CM

## 2014-11-15 DIAGNOSIS — J45909 Unspecified asthma, uncomplicated: Secondary | ICD-10-CM | POA: Diagnosis present

## 2014-11-15 DIAGNOSIS — F17210 Nicotine dependence, cigarettes, uncomplicated: Secondary | ICD-10-CM | POA: Diagnosis present

## 2014-11-15 DIAGNOSIS — Z833 Family history of diabetes mellitus: Secondary | ICD-10-CM

## 2014-11-15 DIAGNOSIS — F329 Major depressive disorder, single episode, unspecified: Secondary | ICD-10-CM | POA: Diagnosis present

## 2014-11-15 DIAGNOSIS — J438 Other emphysema: Secondary | ICD-10-CM

## 2014-11-15 DIAGNOSIS — I509 Heart failure, unspecified: Secondary | ICD-10-CM | POA: Diagnosis present

## 2014-11-15 DIAGNOSIS — Z8249 Family history of ischemic heart disease and other diseases of the circulatory system: Secondary | ICD-10-CM

## 2014-11-15 DIAGNOSIS — E119 Type 2 diabetes mellitus without complications: Secondary | ICD-10-CM

## 2014-11-15 DIAGNOSIS — K625 Hemorrhage of anus and rectum: Secondary | ICD-10-CM | POA: Diagnosis not present

## 2014-11-15 DIAGNOSIS — K921 Melena: Secondary | ICD-10-CM | POA: Diagnosis present

## 2014-11-15 DIAGNOSIS — M810 Age-related osteoporosis without current pathological fracture: Secondary | ICD-10-CM | POA: Diagnosis present

## 2014-11-15 DIAGNOSIS — I10 Essential (primary) hypertension: Secondary | ICD-10-CM | POA: Diagnosis present

## 2014-11-15 DIAGNOSIS — M4316 Spondylolisthesis, lumbar region: Secondary | ICD-10-CM | POA: Diagnosis present

## 2014-11-15 DIAGNOSIS — R1031 Right lower quadrant pain: Secondary | ICD-10-CM | POA: Diagnosis present

## 2014-11-15 DIAGNOSIS — R1032 Left lower quadrant pain: Secondary | ICD-10-CM

## 2014-11-15 DIAGNOSIS — J449 Chronic obstructive pulmonary disease, unspecified: Secondary | ICD-10-CM | POA: Diagnosis present

## 2014-11-15 DIAGNOSIS — M797 Fibromyalgia: Secondary | ICD-10-CM | POA: Diagnosis present

## 2014-11-15 DIAGNOSIS — G35 Multiple sclerosis: Secondary | ICD-10-CM | POA: Diagnosis present

## 2014-11-15 DIAGNOSIS — K219 Gastro-esophageal reflux disease without esophagitis: Secondary | ICD-10-CM | POA: Diagnosis present

## 2014-11-15 DIAGNOSIS — G894 Chronic pain syndrome: Secondary | ICD-10-CM | POA: Diagnosis present

## 2014-11-15 DIAGNOSIS — F419 Anxiety disorder, unspecified: Secondary | ICD-10-CM | POA: Diagnosis present

## 2014-11-15 DIAGNOSIS — K529 Noninfective gastroenteritis and colitis, unspecified: Secondary | ICD-10-CM | POA: Diagnosis present

## 2014-11-15 DIAGNOSIS — D62 Acute posthemorrhagic anemia: Secondary | ICD-10-CM | POA: Insufficient documentation

## 2014-11-15 DIAGNOSIS — Z87442 Personal history of urinary calculi: Secondary | ICD-10-CM

## 2014-11-15 DIAGNOSIS — E785 Hyperlipidemia, unspecified: Secondary | ICD-10-CM | POA: Diagnosis present

## 2014-11-15 DIAGNOSIS — Z8673 Personal history of transient ischemic attack (TIA), and cerebral infarction without residual deficits: Secondary | ICD-10-CM

## 2014-11-15 DIAGNOSIS — M419 Scoliosis, unspecified: Secondary | ICD-10-CM | POA: Diagnosis present

## 2014-11-15 DIAGNOSIS — E039 Hypothyroidism, unspecified: Secondary | ICD-10-CM | POA: Diagnosis present

## 2014-11-15 HISTORY — DX: Gout, unspecified: M10.9

## 2014-11-15 LAB — COMPREHENSIVE METABOLIC PANEL
ALBUMIN: 4.5 g/dL (ref 3.5–5.2)
ALT: 19 U/L (ref 0–35)
AST: 15 U/L (ref 0–37)
Alkaline Phosphatase: 80 U/L (ref 39–117)
Anion gap: 7 (ref 5–15)
BUN: 15 mg/dL (ref 6–23)
CO2: 31 mmol/L (ref 19–32)
CREATININE: 0.83 mg/dL (ref 0.50–1.10)
Calcium: 9.8 mg/dL (ref 8.4–10.5)
Chloride: 105 mEq/L (ref 96–112)
GFR calc Af Amer: >90 mL/min (ref >90)
GFR, EST NON AFRICAN AMERICAN: 78 mL/min — AB (ref >90)
GLUCOSE: 135 mg/dL — AB (ref 70–99)
POTASSIUM: 4.5 mmol/L (ref 3.5–5.1)
SODIUM: 143 mmol/L (ref 135–145)
TOTAL PROTEIN: 7.3 g/dL (ref 6.0–8.3)
Total Bilirubin: 0.7 mg/dL (ref 0.3–1.2)

## 2014-11-15 LAB — CBC WITH DIFFERENTIAL/PLATELET
Basophils Absolute: 0 10*3/uL (ref 0.0–0.1)
Basophils Relative: 0 % (ref 0–1)
Eosinophils Absolute: 0 10*3/uL (ref 0.0–0.7)
Eosinophils Relative: 0 % (ref 0–5)
HCT: 43.9 % (ref 36.0–46.0)
Hemoglobin: 14.8 g/dL (ref 12.0–15.0)
LYMPHS ABS: 1.7 10*3/uL (ref 0.7–4.0)
Lymphocytes Relative: 11 % — ABNORMAL LOW (ref 12–46)
MCH: 30.7 pg (ref 26.0–34.0)
MCHC: 33.7 g/dL (ref 30.0–36.0)
MCV: 91.1 fL (ref 78.0–100.0)
Monocytes Absolute: 1.4 10*3/uL — ABNORMAL HIGH (ref 0.1–1.0)
Monocytes Relative: 9 % (ref 3–12)
NEUTROS ABS: 12.5 10*3/uL — AB (ref 1.7–7.7)
Neutrophils Relative %: 80 % — ABNORMAL HIGH (ref 43–77)
PLATELETS: 275 10*3/uL (ref 150–400)
RBC: 4.82 MIL/uL (ref 3.87–5.11)
RDW: 14.7 % (ref 11.5–15.5)
WBC: 15.6 10*3/uL — ABNORMAL HIGH (ref 4.0–10.5)

## 2014-11-15 LAB — URINALYSIS, ROUTINE W REFLEX MICROSCOPIC
BILIRUBIN URINE: NEGATIVE
GLUCOSE, UA: NEGATIVE mg/dL
Leukocytes, UA: NEGATIVE
Nitrite: NEGATIVE
PH: 5.5 (ref 5.0–8.0)
Protein, ur: NEGATIVE mg/dL
UROBILINOGEN UA: 0.2 mg/dL (ref 0.0–1.0)

## 2014-11-15 LAB — URINE MICROSCOPIC-ADD ON

## 2014-11-15 MED ORDER — OXYCODONE HCL 5 MG PO TABS
15.0000 mg | ORAL_TABLET | ORAL | Status: DC | PRN
  Administered 2014-11-16 – 2014-11-18 (×6): 15 mg via ORAL
  Filled 2014-11-15 (×6): qty 3

## 2014-11-15 MED ORDER — IOHEXOL 300 MG/ML  SOLN
100.0000 mL | Freq: Once | INTRAMUSCULAR | Status: AC | PRN
  Administered 2014-11-15: 100 mL via INTRAVENOUS

## 2014-11-15 MED ORDER — CLONAZEPAM 0.5 MG PO TABS
0.5000 mg | ORAL_TABLET | Freq: Two times a day (BID) | ORAL | Status: DC
  Administered 2014-11-15 – 2014-11-19 (×8): 0.5 mg via ORAL
  Filled 2014-11-15 (×8): qty 1

## 2014-11-15 MED ORDER — CIPROFLOXACIN IN D5W 400 MG/200ML IV SOLN
400.0000 mg | Freq: Two times a day (BID) | INTRAVENOUS | Status: DC
  Administered 2014-11-15 – 2014-11-20 (×10): 400 mg via INTRAVENOUS
  Filled 2014-11-15 (×10): qty 200

## 2014-11-15 MED ORDER — SODIUM CHLORIDE 0.9 % IV BOLUS (SEPSIS)
1000.0000 mL | Freq: Once | INTRAVENOUS | Status: AC
  Administered 2014-11-15: 1000 mL via INTRAVENOUS

## 2014-11-15 MED ORDER — ONDANSETRON HCL 4 MG/2ML IJ SOLN
4.0000 mg | Freq: Once | INTRAMUSCULAR | Status: AC
  Administered 2014-11-15: 4 mg via INTRAVENOUS
  Filled 2014-11-15: qty 2

## 2014-11-15 MED ORDER — ONDANSETRON HCL 4 MG/2ML IJ SOLN
4.0000 mg | Freq: Four times a day (QID) | INTRAMUSCULAR | Status: DC | PRN
  Administered 2014-11-15 – 2014-11-16 (×2): 4 mg via INTRAVENOUS
  Filled 2014-11-15 (×2): qty 2

## 2014-11-15 MED ORDER — OXYCODONE HCL ER 20 MG PO T12A
20.0000 mg | EXTENDED_RELEASE_TABLET | Freq: Two times a day (BID) | ORAL | Status: DC
  Administered 2014-11-15 – 2014-11-18 (×7): 20 mg via ORAL
  Filled 2014-11-15 (×7): qty 1

## 2014-11-15 MED ORDER — MORPHINE SULFATE 2 MG/ML IJ SOLN
1.0000 mg | INTRAMUSCULAR | Status: DC | PRN
  Administered 2014-11-15 – 2014-11-18 (×10): 1 mg via INTRAVENOUS
  Filled 2014-11-15 (×11): qty 1

## 2014-11-15 MED ORDER — MORPHINE SULFATE 4 MG/ML IJ SOLN
4.0000 mg | Freq: Once | INTRAMUSCULAR | Status: AC
  Administered 2014-11-15: 4 mg via INTRAVENOUS
  Filled 2014-11-15: qty 1

## 2014-11-15 MED ORDER — IOHEXOL 300 MG/ML  SOLN
50.0000 mL | Freq: Once | INTRAMUSCULAR | Status: AC | PRN
  Administered 2014-11-15: 50 mL via ORAL

## 2014-11-15 MED ORDER — CYCLOBENZAPRINE HCL 10 MG PO TABS
10.0000 mg | ORAL_TABLET | Freq: Three times a day (TID) | ORAL | Status: DC
  Administered 2014-11-16 – 2014-11-19 (×12): 10 mg via ORAL
  Filled 2014-11-15 (×12): qty 1

## 2014-11-15 MED ORDER — SODIUM CHLORIDE 0.9 % IV SOLN: INTRAVENOUS | Status: DC

## 2014-11-15 MED ORDER — METRONIDAZOLE IN NACL 5-0.79 MG/ML-% IV SOLN
500.0000 mg | Freq: Three times a day (TID) | INTRAVENOUS | Status: DC
  Administered 2014-11-16 – 2014-11-20 (×14): 500 mg via INTRAVENOUS
  Filled 2014-11-15 (×14): qty 100

## 2014-11-15 MED ORDER — ONDANSETRON HCL 4 MG PO TABS: 4.0000 mg | ORAL_TABLET | Freq: Four times a day (QID) | ORAL | Status: DC | PRN

## 2014-11-15 NOTE — ED Notes (Signed)
Lower back pain radiating to lower abdomen x 3 days.  Began having bright red bleeding from rectum w/clots w/each bowel movement every 30-40 minutes.  Denies n/v.  Reports lightheadedness.  Last food was last night which, "ran right through" her.

## 2014-11-15 NOTE — ED Provider Notes (Addendum)
[name]: [id number]     Arrival date & time 11/15/14  1407 History  This chart was scribed for Nichole Hutching, MD by Evon Slack, ED Scribe. This patient was seen in room APA01/AP[facility]01 and the patient's care was started at 7:22 PM.    Chief Complaint  Patient presents with  . Rectal Bleeding   The history is provided by the patient. No language interpreter was used.   HPI Comments: [name] is a 54 y.o. female who presents to the Emergency Department complaining of several episodes of new rectal bleeding onset 1 day ago. Pt describes the bleeding as bright red clots. Pt states she has associated low cramping abdominal pain and diarrhea. Pt states that having a bowel movement improves her pain temporarily. Pt denies nausea or vomiting.  No fever or chills. Severity is moderate. Nothing makes symptoms better or worse PCP Dr Margo Aye.   Past Medical History  Diagnosis Date  . Gastritis   . Fibromyalgia   . Edema of lower extremity   . DDD (degenerative disc disease)   . MS (multiple sclerosis)     early stages  . Neuropathy   . COPD (chronic obstructive pulmonary disease)   . Hiatal hernia   . Gout   . TIA (transient ischemic attack)     Hx: of  . Asthma     Albuterol prn   . Depression     takes Celexa,Clonazepam daily  . Muscle spasm     takes Flexeril daily as needed  . GERD (gastroesophageal reflux disease)     takes Pepcid daily  . CHF (congestive heart failure)     takes Furosemide daily  . Hypothyroidism     takes Synthroid daily  . HTN (hypertension)     takes lisinopril daily  . Complication of anesthesia     hard to wake up  . PONV (postoperative nausea and vomiting)   . Sleep apnea     sleep study done at least 39yrs ago;has CPAP but doesn't use  . Hyperlipidemia     but doesn't take any meds  . Phlebitis   . Emphysema lung   . Shortness of breath     with exertion or stressed   . Pneumonia     hx of;last time in 2014  . History of bronchitis     last time  4-54months ago   . Migraine     has one now  . Migraine   . Stroke     left sided weakness  . Joint pain   . Joint swelling   . Chronic back pain     DDD/scoliosis  . Osteoporosis   . Hemorrhoids   . History of colon polyps   . Urinary frequency   . History of kidney stones     is in her left kidney and has been for couple of yrs and no problems  . Diabetes mellitus     borderline  . Insomnia     doesn't take any meds for this   Past Surgical History  Procedure Laterality Date  . Fundic gland polyp      benign  . Sigmoidoscopy  01/31/08    large internal hemorrhoids/small rectal polyp removed/rare sigmoid diverticula  . Esophagogastroduodenoscopy  10/29/07    normal  . Cholecystectomy    . Bladder suspension    . Abdominal hysterectomy    . Carpal tunnel release Right   . Cardiac catheterization    . Colonoscopy    .  Anterior cervical decomp/discectomy fusion N/A 12/30/2013    Procedure: ANTERIOR CERVICAL DISCECTOMY FUSION C4-5, C5-6, plate and screws, allograft, local bone graft;  Surgeon: Kerrin Champagne, MD;  Location: [facility] OR;  Service: Orthopedics;  Laterality: N/A;   Family History  Problem Relation Age of Onset  . Diabetes Mother   . Hypertension Mother   . Arthritis Father   . Diabetes Sister   . Neuropathy Sister   . Transient ischemic attack Sister   . Heart disease Sister   . Hypertension Sister   . Cancer - Lung Other   . Cancer - Prostate Other   . Cancer - Other Other    History  Substance Use Topics  . Smoking status: Current Some Day Smoker -- 0.50 packs/day for 15 years    Types: Cigarettes  . Smokeless tobacco: Never Used  . Alcohol Use: No   OB History    No data available     Review of Systems  Gastrointestinal: Positive for abdominal pain, diarrhea and blood in stool. Negative for nausea and vomiting.  All other systems reviewed and are negative.    Allergies  Sulfa antibiotics; Demerol; Penicillins; Levaquin; Neurontin; Toradol; and  Doxycycline  Home Medications   Prior to Admission medications   Medication Sig Start Date End Date Taking? Authorizing Provider  albuterol (PROVENTIL HFA;VENTOLIN HFA) 108 (90 BASE) MCG/ACT inhaler Inhale 2 puffs into the lungs every 6 (six) hours as needed. Shortness of breath.    Historical Provider, MD  aspirin EC 81 MG tablet Take 81 mg by mouth every other day.    Historical Provider, MD  citalopram (CELEXA) 40 MG tablet Take 40 mg by mouth daily.    Historical Provider, MD  clonazePAM (KLONOPIN) 0.5 MG tablet Take 0.5 mg by mouth 2 (two) times daily.  10/11/13   Historical Provider, MD  Docusate Sodium (STOOL SOFTENER) 100 MG capsule Take 1 tablet (100 mg total) by mouth 2 (two) times daily. 04/17/14   Kerrin Champagne, MD  famotidine (PEPCID) 20 MG tablet Take 1 tablet (20 mg total) by mouth 2 (two) times daily. 08/27/13   Gwenyth Bender, NP  ferrous sulfate 325 (65 FE) MG tablet Take 1 tablet (325 mg total) by mouth 2 (two) times daily with a meal. 04/17/14   Kerrin Champagne, MD  Fluticasone-Salmeterol (ADVAIR) 250-50 MCG/DOSE AEPB Inhale 1 puff into the lungs 2 (two) times daily as needed (shortness of breath).     Historical Provider, MD  furosemide (LASIX) 20 MG tablet Take 20 mg by mouth daily.    Historical Provider, MD  levothyroxine (SYNTHROID, LEVOTHROID) 125 MCG tablet Take 125-187.5 mcg by mouth See admin instructions. Take 1 tablet (125 mcg total) on Sun Tue Wed Fri Sat. 1.5 tabs (187.36mcg) on Monday and Thursday 08/13/12   Cristal Ford, MD  lisinopril (PRINIVIL,ZESTRIL) 40 MG tablet Take 40 mg by mouth daily.    Historical Provider, MD  methocarbamol (ROBAXIN) 500 MG tablet Take 1 tablet (500 mg total) by mouth every 6 (six) hours as needed for muscle spasms. 04/17/14   Kerrin Champagne, MD  OxyCODONE (OXYCONTIN) 20 mg T12A 12 hr tablet Take 1 tablet (20 mg total) by mouth every 12 (twelve) hours. 04/18/14   Kerrin Champagne, MD  oxyCODONE (ROXICODONE) 15 MG immediate release tablet Take 1  tablet (15 mg total) by mouth every 4 (four) hours as needed for severe pain or breakthrough pain (prn break through pain). 04/18/14   Guy Sandifer  Otelia Sergeant, MD   Triage Vitals: BP 151/84 mmHg  Pulse 96  Temp(Src) 99 F (37.2 C) (Oral)  Resp 16  Ht  (1.651 m)  Wt 104 lb (47.174 kg)  BMI 17.31 kg/m2  SpO2 99%  Physical Exam  Constitutional: She is oriented to person, place, and time. She appears well-developed and well-nourished.  HENT:  Head: Normocephalic and atraumatic.  Eyes: Conjunctivae and EOM are normal. Pupils are equal, round, and reactive to light.  Neck: Normal range of motion. Neck supple.  Cardiovascular: Normal rate and regular rhythm.   Pulmonary/Chest: Effort normal and breath sounds normal.  Abdominal: Soft. Bowel sounds are normal. There is tenderness.  Tender in lower abdomen.   Genitourinary:  Rectal exam: No masses. Grossly heme positive  Musculoskeletal: Normal range of motion.  Neurological: She is alert and oriented to person, place, and time.  Skin: Skin is warm and dry.  Psychiatric: She has a normal mood and affect. Her behavior is normal.  Nursing note and vitals reviewed.   ED Course  Procedures (including critical care time) DIAGNOSTIC STUDIES: Oxygen Saturation is 99% on RA, normal by my interpretation.    COORDINATION OF CARE: 7:36 PM-Discussed treatment plan with pt at bedside and pt agreed to plan.     Labs Review Labs Reviewed  CBC WITH DIFFERENTIAL - Abnormal; Notable for the following:    WBC 15.6 (*)    Neutrophils Relative % 80 (*)    Neutro Abs 12.5 (*)    Lymphocytes Relative 11 (*)    Monocytes Absolute 1.4 (*)    All other components within normal limits  COMPREHENSIVE METABOLIC PANEL - Abnormal; Notable for the following:    Glucose, Bld 135 (*)    GFR calc non Af Amer 78 (*)    All other components within normal limits  URINALYSIS, ROUTINE W REFLEX MICROSCOPIC - Abnormal; Notable for the following:    Specific Gravity,  Urine >1.030 (*)    Hgb urine dipstick MODERATE (*)    Ketones, ur TRACE (*)    All other components within normal limits  URINE MICROSCOPIC-ADD ON  POC OCCULT BLOOD, ED    Imaging Review No results found.   EKG Interpretation None      MDM   Final diagnoses:  Rectal bleeding   Patient complains of intermittent rectal bleeding associated with abdominal cramping.  Blood pressure and hemoglobin stable. CT abdomen/ pelvis pending. Admit to observation   I personally performed the services described in this documentation, which was scribed in my presence. The recorded information has been reviewed and is accurate.      Nichole Hutching, MD 11/15/14 2100  Nichole Hutching, MD 11/15/14 2118

## 2014-11-15 NOTE — H&P (Signed)
PCP:   [name], MD   Chief Complaint:  Bleeding from rectum  HPI: 54 yo female with one day h/o multiple bloody diarrhea movements with associated bilateral lower abd cramps, some nausea no vomiting.  No fevers.  No sick contacts.  No recent iv abx or hospitalizations.  Asked to obs pt for lgib, imaging was requested to r/o infectious treatable source of symptoms.  Review of Systems:  Positive and negative as per HPI otherwise all other systems are negative  Past Medical History: Past Medical History  Diagnosis Date  . Gastritis   . Fibromyalgia   . Edema of lower extremity   . DDD (degenerative disc disease)   . MS (multiple sclerosis)     early stages  . Neuropathy   . COPD (chronic obstructive pulmonary disease)   . Hiatal hernia   . Gout   . TIA (transient ischemic attack)     Hx: of  . Asthma     Albuterol prn   . Depression     takes Celexa,Clonazepam daily  . Muscle spasm     takes Flexeril daily as needed  . GERD (gastroesophageal reflux disease)     takes Pepcid daily  . CHF (congestive heart failure)     takes Furosemide daily  . Hypothyroidism     takes Synthroid daily  . HTN (hypertension)     takes lisinopril daily  . Complication of anesthesia     hard to wake up  . PONV (postoperative nausea and vomiting)   . Sleep apnea     sleep study done at least 34yrs ago;has CPAP but doesn't use  . Hyperlipidemia     but doesn't take any meds  . Phlebitis   . Emphysema lung   . Shortness of breath     with exertion or stressed   . Pneumonia     hx of;last time in 2014  . History of bronchitis     last time 4-79months ago   . Migraine     has one now  . Migraine   . Stroke     left sided weakness  . Joint pain   . Joint swelling   . Chronic back pain     DDD/scoliosis  . Osteoporosis   . Hemorrhoids   . History of colon polyps   . Urinary frequency   . History of kidney stones     is in her left kidney and has been for couple of yrs and no  problems  . Diabetes mellitus     borderline  . Insomnia     doesn't take any meds for this   Past Surgical History  Procedure Laterality Date  . Fundic gland polyp      benign  . Sigmoidoscopy  01/31/08    large internal hemorrhoids/small rectal polyp removed/rare sigmoid diverticula  . Esophagogastroduodenoscopy  10/29/07    normal  . Cholecystectomy    . Bladder suspension    . Abdominal hysterectomy    . Carpal tunnel release Right   . Cardiac catheterization    . Colonoscopy    . Anterior cervical decomp/discectomy fusion N/A 12/30/2013    Procedure: ANTERIOR CERVICAL DISCECTOMY FUSION C4-5, C5-6, plate and screws, allograft, local bone graft;  Surgeon: Kerrin Champagne, MD;  Location: [facility] OR;  Service: Orthopedics;  Laterality: N/A;    Medications: Prior to Admission medications   Medication Sig Start Date End Date Taking? Authorizing Provider  albuterol (PROVENTIL HFA;VENTOLIN HFA) 108 (  90 BASE) MCG/ACT inhaler Inhale 2 puffs into the lungs every 6 (six) hours as needed. Shortness of breath.    Historical Provider, MD  aspirin EC 81 MG tablet Take 81 mg by mouth every other day.    Historical Provider, MD  citalopram (CELEXA) 40 MG tablet Take 40 mg by mouth daily.    Historical Provider, MD  clonazePAM (KLONOPIN) 0.5 MG tablet Take 0.5 mg by mouth 2 (two) times daily.  10/11/13   Historical Provider, MD  Docusate Sodium (STOOL SOFTENER) 100 MG capsule Take 1 tablet (100 mg total) by mouth 2 (two) times daily. 04/17/14   Kerrin Champagne, MD  famotidine (PEPCID) 20 MG tablet Take 1 tablet (20 mg total) by mouth 2 (two) times daily. 08/27/13   Gwenyth Bender, NP  ferrous sulfate 325 (65 FE) MG tablet Take 1 tablet (325 mg total) by mouth 2 (two) times daily with a meal. 04/17/14   Kerrin Champagne, MD  Fluticasone-Salmeterol (ADVAIR) 250-50 MCG/DOSE AEPB Inhale 1 puff into the lungs 2 (two) times daily as needed (shortness of breath).     Historical Provider, MD  furosemide (LASIX) 20 MG  tablet Take 20 mg by mouth daily.    Historical Provider, MD  levothyroxine (SYNTHROID, LEVOTHROID) 125 MCG tablet Take 125-187.5 mcg by mouth See admin instructions. Take 1 tablet (125 mcg total) on Sun Tue Wed Fri Sat. 1.5 tabs (187.55mcg) on Monday and Thursday 08/13/12   Cristal Ford, MD  lisinopril (PRINIVIL,ZESTRIL) 40 MG tablet Take 40 mg by mouth daily.    Historical Provider, MD  methocarbamol (ROBAXIN) 500 MG tablet Take 1 tablet (500 mg total) by mouth every 6 (six) hours as needed for muscle spasms. 04/17/14   Kerrin Champagne, MD  OxyCODONE (OXYCONTIN) 20 mg T12A 12 hr tablet Take 1 tablet (20 mg total) by mouth every 12 (twelve) hours. 04/18/14   Kerrin Champagne, MD  oxyCODONE (ROXICODONE) 15 MG immediate release tablet Take 1 tablet (15 mg total) by mouth every 4 (four) hours as needed for severe pain or breakthrough pain (prn break through pain). 04/18/14   Kerrin Champagne, MD    Allergies:   Allergies  Allergen Reactions  . Sulfa Antibiotics Anaphylaxis  . Demerol [Meperidine] Swelling  . Penicillins Swelling  . Levaquin [Levofloxacin In D5w]     Abdominal pain  . Neurontin [Gabapentin] Other (See Comments)    Abdominal pain  . Toradol [Ketorolac Tromethamine] Other (See Comments)    "Feels like my whole body is on fire."  . Doxycycline Nausea And Vomiting         Social History:  reports that she has been smoking Cigarettes.  She has a 7.5 pack-year smoking history. She has never used smokeless tobacco. She reports that she does not drink alcohol or use illicit drugs.  Family History: Family History  Problem Relation Age of Onset  . Diabetes Mother   . Hypertension Mother   . Arthritis Father   . Diabetes Sister   . Neuropathy Sister   . Transient ischemic attack Sister   . Heart disease Sister   . Hypertension Sister   . Cancer - Lung Other   . Cancer - Prostate Other   . Cancer - Other Other     Physical Exam: Filed Vitals:   11/15/14 1434 11/15/14 1823  BP:  157/82 151/84  Pulse: 99 96  Temp: 98.3 F (36.8 C) 99 F (37.2 C)  TempSrc: Oral Oral  Resp:  14 16  Height:  (1.651 m)   Weight: 47.174 kg (104 lb)   SpO2: 99% 99%   General appearance: alert, cooperative and no distress Head: Normocephalic, without obvious abnormality, atraumatic Eyes: negative Nose: Nares normal. Septum midline. Mucosa normal. No drainage or sinus tenderness. Neck: no JVD and supple, symmetrical, trachea midline Lungs: clear to auscultation bilaterally Heart: regular rate and rhythm, S1, S2 normal, no murmur, click, rub or gallop Abdomen: soft, non-tender; bowel sounds normal; no masses,  no organomegaly Extremities: extremities normal, atraumatic, no cyanosis or edema Pulses: 2+ and symmetric Skin: Skin color, texture, turgor normal. No rashes or lesions Neurologic: Grossly normal    Labs on Admission:   Recent Labs  11/15/14 1513  NA 143  K 4.5  CL 105  CO2 31  GLUCOSE 135*  BUN 15  CREATININE 0.83  CALCIUM 9.8    Recent Labs  11/15/14 1513  AST 15  ALT 19  ALKPHOS 80  BILITOT 0.7  PROT 7.3  ALBUMIN 4.5    Recent Labs  11/15/14 1513  WBC 15.6*  NEUTROABS 12.5*  HGB 14.8  HCT 43.9  MCV 91.1  PLT 275   Radiological Exams on Admission: Ct Abdomen Pelvis W Contrast  11/15/2014   CLINICAL DATA:  Several episodes rectal bleeding starting 1 day ago. Bright-red clots. Associated right lower quadrant and left lower quadrant abdominal pain and cramping with diarrhea. Nausea.  EXAM: CT ABDOMEN AND PELVIS WITH CONTRAST  TECHNIQUE: Multidetector CT imaging of the abdomen and pelvis was performed using the standard protocol following bolus administration of intravenous contrast.  CONTRAST:  50mL OMNIPAQUE IOHEXOL 300 MG/ML SOLN, OMNIPAQUE IOHEXOL 300 MG/ML SOLN  COMPARISON:  MRI pelvis 08/04/2014. CT abdomen and pelvis 05/17/2011.  FINDINGS: The lung bases are clear.  Surgical absence of the gallbladder. Mild physiologic dilatation of  bile ducts. No focal liver lesions. The pancreas, spleen, adrenal glands, kidneys, abdominal aorta, inferior vena cava, and retroperitoneal lymph nodes are unremarkable. Stomach and small bowel appear normal. Contrast material flows through to the colon without evidence of bowel obstruction. No free air or free fluid in the abdomen.  Pelvis: The colon demonstrates diffuse wall thickening and edema beginning in the mid transverse region extending throughout the descending colon to the rectosigmoid. This suggests colitis and may be infectious or inflammatory in etiology. Mild pericolonic fatty infiltration. Bladder wall is mildly thickened suggesting possible cystitis. Small amount of free fluid in the pelvis is likely reactive. Uterus appears surgically absent. No pelvic mass or lymphadenopathy. Appendix is normal. Postoperative changes with posterior fixation at L5-S1. Slight residual anterior subluxation demonstrated. No destructive bone lesions appreciated.  IMPRESSION: Colonic wall thickening and edema or extending from the mid transverse region to the descending and rectosigmoid colon. This suggests nonspecific colitis. Consider infectious or inflammatory etiologies. Bladder wall is mildly thickened suggesting possible cystitis.   Electronically Signed   By: Burman Nieves M.D.   On: 11/15/2014 22:14    Assessment/Plan  54 yo female with bloody diarrhea and lower abd pain and acute colitis on ct scan Principal Problem:   Colitis, acute-  Treat with cipro and flagyl.  Obtain stool cx and cdiff.  abd exam is benign.  Clear liq diet.  q 12 hour h/h.  If h/h remains stable overnight could consider d/c tomorrow on po abx with gi outpt f/u.  Active Problems:  Stable unless o/w noted   DM (diabetes mellitus)   Leucocytosis   Hypertension   COPD (  chronic obstructive pulmonary disease)  stable   Chronic pain syndrome   Spondylolisthesis of lumbar region   Hematochezia  As above   Bilateral lower  abdominal cramping  Iv prn morphine  obs on medical bed.  Full code.  Clarify home meds in am.  Nevaan Bunton A 11/15/2014, 10:26 PM

## 2014-11-16 ENCOUNTER — Encounter (HOSPITAL_COMMUNITY): Payer: Self-pay | Admitting: Gastroenterology

## 2014-11-16 DIAGNOSIS — G35 Multiple sclerosis: Secondary | ICD-10-CM | POA: Diagnosis present

## 2014-11-16 DIAGNOSIS — E119 Type 2 diabetes mellitus without complications: Secondary | ICD-10-CM | POA: Diagnosis present

## 2014-11-16 DIAGNOSIS — R1032 Left lower quadrant pain: Secondary | ICD-10-CM

## 2014-11-16 DIAGNOSIS — F329 Major depressive disorder, single episode, unspecified: Secondary | ICD-10-CM | POA: Diagnosis present

## 2014-11-16 DIAGNOSIS — M810 Age-related osteoporosis without current pathological fracture: Secondary | ICD-10-CM | POA: Diagnosis present

## 2014-11-16 DIAGNOSIS — R1031 Right lower quadrant pain: Secondary | ICD-10-CM

## 2014-11-16 DIAGNOSIS — E039 Hypothyroidism, unspecified: Secondary | ICD-10-CM | POA: Diagnosis present

## 2014-11-16 DIAGNOSIS — J45909 Unspecified asthma, uncomplicated: Secondary | ICD-10-CM | POA: Diagnosis present

## 2014-11-16 DIAGNOSIS — G894 Chronic pain syndrome: Secondary | ICD-10-CM | POA: Diagnosis present

## 2014-11-16 DIAGNOSIS — Z8673 Personal history of transient ischemic attack (TIA), and cerebral infarction without residual deficits: Secondary | ICD-10-CM | POA: Diagnosis not present

## 2014-11-16 DIAGNOSIS — K219 Gastro-esophageal reflux disease without esophagitis: Secondary | ICD-10-CM | POA: Diagnosis present

## 2014-11-16 DIAGNOSIS — M4316 Spondylolisthesis, lumbar region: Secondary | ICD-10-CM | POA: Diagnosis present

## 2014-11-16 DIAGNOSIS — Z833 Family history of diabetes mellitus: Secondary | ICD-10-CM | POA: Diagnosis not present

## 2014-11-16 DIAGNOSIS — M797 Fibromyalgia: Secondary | ICD-10-CM | POA: Diagnosis present

## 2014-11-16 DIAGNOSIS — J449 Chronic obstructive pulmonary disease, unspecified: Secondary | ICD-10-CM | POA: Diagnosis present

## 2014-11-16 DIAGNOSIS — F17210 Nicotine dependence, cigarettes, uncomplicated: Secondary | ICD-10-CM | POA: Diagnosis present

## 2014-11-16 DIAGNOSIS — K921 Melena: Secondary | ICD-10-CM

## 2014-11-16 DIAGNOSIS — K625 Hemorrhage of anus and rectum: Secondary | ICD-10-CM | POA: Diagnosis present

## 2014-11-16 DIAGNOSIS — Z8249 Family history of ischemic heart disease and other diseases of the circulatory system: Secondary | ICD-10-CM | POA: Diagnosis not present

## 2014-11-16 DIAGNOSIS — E785 Hyperlipidemia, unspecified: Secondary | ICD-10-CM | POA: Diagnosis present

## 2014-11-16 DIAGNOSIS — M419 Scoliosis, unspecified: Secondary | ICD-10-CM | POA: Diagnosis present

## 2014-11-16 DIAGNOSIS — I10 Essential (primary) hypertension: Secondary | ICD-10-CM | POA: Diagnosis present

## 2014-11-16 DIAGNOSIS — A09 Infectious gastroenteritis and colitis, unspecified: Secondary | ICD-10-CM | POA: Diagnosis present

## 2014-11-16 DIAGNOSIS — D62 Acute posthemorrhagic anemia: Secondary | ICD-10-CM | POA: Diagnosis present

## 2014-11-16 DIAGNOSIS — I509 Heart failure, unspecified: Secondary | ICD-10-CM | POA: Diagnosis present

## 2014-11-16 DIAGNOSIS — Z87442 Personal history of urinary calculi: Secondary | ICD-10-CM | POA: Diagnosis not present

## 2014-11-16 DIAGNOSIS — F419 Anxiety disorder, unspecified: Secondary | ICD-10-CM | POA: Diagnosis present

## 2014-11-16 LAB — HEMOGLOBIN AND HEMATOCRIT, BLOOD
HCT: 35.8 % — ABNORMAL LOW (ref 36.0–46.0)
HCT: 39.5 % (ref 36.0–46.0)
HEMOGLOBIN: 13.1 g/dL (ref 12.0–15.0)
Hemoglobin: 11.8 g/dL — ABNORMAL LOW (ref 12.0–15.0)

## 2014-11-16 LAB — CBC
HEMATOCRIT: 34.5 % — AB (ref 36.0–46.0)
HEMATOCRIT: 34.5 % — AB (ref 36.0–46.0)
HEMOGLOBIN: 11.4 g/dL — AB (ref 12.0–15.0)
HEMOGLOBIN: 11.6 g/dL — AB (ref 12.0–15.0)
MCH: 30.7 pg (ref 26.0–34.0)
MCH: 31.4 pg (ref 26.0–34.0)
MCHC: 33 g/dL (ref 30.0–36.0)
MCHC: 33.6 g/dL (ref 30.0–36.0)
MCV: 93 fL (ref 78.0–100.0)
MCV: 93.2 fL (ref 78.0–100.0)
PLATELETS: 175 10*3/uL (ref 150–400)
Platelets: 176 10*3/uL (ref 150–400)
RBC: 3.71 MIL/uL — ABNORMAL LOW (ref 3.87–5.11)
RBC: 3.7 MIL/uL — ABNORMAL LOW (ref 3.87–5.11)
RDW: 15.1 % (ref 11.5–15.5)
RDW: 15 % (ref 11.5–15.5)
WBC: 10.2 10*3/uL (ref 4.0–10.5)
WBC: 10 10*3/uL (ref 4.0–10.5)

## 2014-11-16 MED ORDER — MOMETASONE FURO-FORMOTEROL FUM 100-5 MCG/ACT IN AERO
2.0000 | INHALATION_SPRAY | Freq: Two times a day (BID) | RESPIRATORY_TRACT | Status: DC
  Administered 2014-11-16 – 2014-11-20 (×8): 2 via RESPIRATORY_TRACT
  Filled 2014-11-16: qty 8.8

## 2014-11-16 MED ORDER — FLUTICASONE FUROATE-VILANTEROL 100-25 MCG/INH IN AEPB: 1.0000 | INHALATION_SPRAY | Freq: Every day | RESPIRATORY_TRACT | Status: DC

## 2014-11-16 MED ORDER — ALPRAZOLAM 1 MG PO TABS
1.0000 mg | ORAL_TABLET | Freq: Three times a day (TID) | ORAL | Status: DC | PRN
  Administered 2014-11-16 – 2014-11-20 (×9): 1 mg via ORAL
  Filled 2014-11-16 (×10): qty 1

## 2014-11-16 MED ORDER — LEVOTHYROXINE SODIUM 88 MCG PO TABS
88.0000 ug | ORAL_TABLET | Freq: Every day | ORAL | Status: DC
  Administered 2014-11-17 – 2014-11-20 (×4): 88 ug via ORAL
  Filled 2014-11-16 (×4): qty 1

## 2014-11-16 MED ORDER — PANTOPRAZOLE SODIUM 40 MG PO TBEC
40.0000 mg | DELAYED_RELEASE_TABLET | Freq: Two times a day (BID) | ORAL | Status: DC
  Administered 2014-11-16 – 2014-11-20 (×8): 40 mg via ORAL
  Filled 2014-11-16 (×8): qty 1

## 2014-11-16 NOTE — Progress Notes (Signed)
PROGRESS NOTE  [name] IFB:[id number] DOB: 02/25/1961 DOA: 11/15/2014 PCP: [name], MD  Summary: 54 year old woman who presented with several day history of lower abdominal cramping and 1 day history of premature blood per rectum with clots.  Assessment/Plan: 1. Acute colitis, infectious versus inflammatory. Has seen Dr. Darrick Penna in the past, not sure when, has had colonoscopy before, polyps? 2. Acute blood loss anemia, hematochezia, GIB. Asymptomatic, bleeding seems to have stopped.  3. Borderline diabetes mellitus, appears be stable. 4. History of fibromyalgia, multiple sclerosis   Appears clinically stable. Plan to continue empiric antibiotics  Trend hemoglobin  GI consultation for further recs  Discussed with husband at bedside  Code Status: full code DVT prophylaxis: SCDs Family Communication:  Disposition Plan: home  Brendia Sacks, MD  [facility]  Pager [phone] If 7PM-7AM, please contact night-coverage at [email or web address], password [email or web address] 11/16/2014, 12:33 PM  LOS: 1 day   Consultants:    Procedures:    Antibiotics:  Ciprofloxacin 1/9 >>   Flagyl 1/9 >>  HPI/Subjective: Some blood but very very little; still has significant lower abdominal cramping. No vomiting but still has some nausea. Sipping on some liquids but little intake. No previous h/o symptoms.   Objective: Filed Vitals:   11/15/14 1823 11/15/14 2259 11/15/14 2300 11/16/14 0607  BP: 151/84 172/85  107/54  Pulse: 96 98  92  Temp: 99 F (37.2 C) 99.3 F (37.4 C)  98.4 F (36.9 C)  TempSrc: Oral Oral  Oral  Resp: 16 16  16   Height:  5\' 5"  (1.651 m) 5\' 5"  (1.651 m)   Weight:   55.475 kg (122 lb 4.8 oz)   SpO2: 99% 98%  95%    Intake/Output Summary (Last 24 hours) at 11/16/14 1233 Last data filed at 11/16/14 0900  Gross per 24 hour  Intake    120 ml  Output      0 ml  Net    120 ml     Filed Weights   11/15/14 1434 11/15/14 2300  Weight: 47.174 kg (104 lb) 55.475 kg  (122 lb 4.8 oz)    Exam:     Afebrile, vital signs stable. General:  Appears calm and comfortable Cardiovascular: RRR, no m/r/g. No LE edema. Respiratory: CTA bilaterally, no w/r/r. Normal respiratory effort. Abdomen: +BS, soft, non-distended; moderate generalized tenderness especially lower abdomen Psychiatric: grossly normal mood and affect, speech fluent and appropriate  Data Reviewed:  Hemoglobin 14.8  >> 13.1  >> 11.8  Pertinent data:  Labs  Complete metabolic panel unremarkable on admission   WBC 15.6 on admission  Imaging   CT abdomen and pelvis: Colonic wall thickening, edema the transverse region to descending and rectosigmoid colon. Nonspecific colitis. Consider infectious or inflammatory.   Pending data:  GI pathogen panel  Scheduled Meds: . sodium chloride   Intravenous STAT  . ciprofloxacin  400 mg Intravenous Q12H  . clonazePAM  0.5 mg Oral BID  . cyclobenzaprine  10 mg Oral 3 times per day  . metronidazole  500 mg Intravenous Q8H  . OxyCODONE  20 mg Oral Q12H   Continuous Infusions: . sodium chloride      Principal Problem:   Colitis, acute Active Problems:   DM (diabetes mellitus)   Leucocytosis   Hypertension   COPD (chronic obstructive pulmonary disease)   Chronic pain syndrome   Spondylolisthesis of lumbar region   Hematochezia   Bilateral lower abdominal cramping   Rectal bleeding   Time spent 20 minutes

## 2014-11-16 NOTE — Consult Note (Addendum)
Referring Provider: No ref. provider found Primary Care Physician:  [name], MD Primary Gastroenterologist:  Jonette Eva  Reason for Consultation:  RECTAL BLEEDING   Impression: ADMITTED WITH ACUTE ONSET VOMITING WED FOLLOWED BY ABDOMINAL CRAMPS AND RECTAL BLEEDING. CT SHOWS COLITIS. MOST LIKELY INFECTIOUS OR ISCHEMIA COLITIS, LESS LIKELY IBD.   Plan: 1. CONTINUE ABX 2. BID PPI 3. CLEAR LIQUIDS 4. NO INDICATION FOR ENDOSCOPY AT THIS TIME. COLONOSCOPY AS INPT IF PT DOES NOT CLINICALLY IMPROVE. IF PT RESPONDS TO ABX, TCS AS AN OUTPT IN 6-8 WEEKS. PT NEED PHENERGAN 25 MG IV IN PREOP. 5. AWAIT GI PATH PANEL  HPI:  PT LAST SEEN AND EVALUATE IN 2008 FOR GERD AND SCREENING COLONOSCOPY. COLONOSCOPY INCOMPLETE DUE TO FORMED STOOL IN COLON AND PT HAD SIGMOID COLON DIVERTICULOSIS AND IH. FELT SHE GOT ENOUGH SEDATION.  WAS IN HER USUAL STATE OF HEALTH UNTIL WED WHEN SHE DEVELOPED VOMITING x2 (NO BLOOD). Felt fine THUR. FRI SX AROUND 0930 began to have abdominal pain(BLQ, GURGLING) and rectal bleeding(PASSINGBRBPR/CLOTS). Didn't see stool with blood. NO SICK CONTACT, DIARRHEA or melena. Takes ASA QOD. USED GOODY POWDER(THUR). SEVERE NAUSEA THIS AM.  REPORTS WEIGHT LOSS DUE TO SYNTHROID DOSE BEING ADJUSTED. NOW ONLY SMOKES 6 CIGS A DAY. HEARTBURN CONTROLLED WITH PEPCID. PT DENIES FEVER, CHILLS, CHEST PAIN, SHORTNESS OF BREATH,  CHANGE IN BOWEL IN HABITS,  problems swallowing, problems with sedation, OR heartburn or indigestion.                                                                                                                                                                                                                                                        Past Medical History  Diagnosis Date  . Gastritis   . Fibromyalgia   . Edema of lower extremity   . DDD (degenerative disc disease)   . MS (multiple sclerosis)      early stages  . Neuropathy   . COPD (chronic obstructive pulmonary disease)   . Hiatal hernia   . Gout   . TIA (transient ischemic attack)     Hx: of  . Asthma     Albuterol prn   . Depression     takes Celexa,Clonazepam daily  . Muscle spasm     takes Flexeril daily as needed  . GERD (gastroesophageal  reflux disease)     takes Pepcid daily  . CHF (congestive heart failure)     takes Furosemide daily  . Hypothyroidism     takes Synthroid daily  . HTN (hypertension)     takes lisinopril daily  . Complication of anesthesia     hard to wake up  . PONV (postoperative nausea and vomiting)   . Sleep apnea     sleep study done at least 32yrs ago;has CPAP but doesn't use  . Hyperlipidemia     but doesn't take any meds  . Phlebitis   . Emphysema lung   . Shortness of breath     with exertion or stressed   . Pneumonia     hx of;last time in 2014  . History of bronchitis     last time 4-30months ago   . Migraine     has one now  . Migraine   . Stroke     left sided weakness  . Joint pain   . Joint swelling   . Chronic back pain     DDD/scoliosis  . Osteoporosis   . Hemorrhoids   . History of colon polyps   . Urinary frequency   . History of kidney stones     is in her left kidney and has been for couple of yrs and no problems  . Diabetes mellitus     borderline  . Insomnia     doesn't take any meds for this     Past Surgical History  Procedure Laterality Date  . Fundic gland polyp      benign  . Sigmoidoscopy  01/31/08    large internal hemorrhoids/small rectal polyp removed/rare sigmoid diverticula  . Esophagogastroduodenoscopy  10/29/07    normal  . Cholecystectomy    . Bladder suspension    . Abdominal hysterectomy    . Carpal tunnel release Right   . Cardiac catheterization    . Colonoscopy    . Anterior cervical decomp/discectomy fusion N/A 12/30/2013    Procedure: ANTERIOR CERVICAL DISCECTOMY FUSION C4-5, C5-6, plate and screws, allograft, local bone  graft;  Surgeon: Kerrin Champagne, MD;  Location: [facility] OR;  Service: Orthopedics;  Laterality: N/A;    Prior to Admission medications   Medication Sig Start Date End Date Taking? Authorizing Provider  albuterol (PROVENTIL HFA;VENTOLIN HFA) 108 (90 BASE) MCG/ACT inhaler Inhale 2 puffs into the lungs every 6 (six) hours as needed. Shortness of breath.   Yes Historical Provider, MD  ALPRAZolam Prudy Feeler) 1 MG tablet Take 1 mg by mouth 3 (three) times daily as needed for anxiety.   Yes Historical Provider, MD  aspirin EC 81 MG tablet Take 81 mg by mouth daily as needed for mild pain or moderate pain.    Yes Historical Provider, MD  citalopram (CELEXA) 40 MG tablet Take 40 mg by mouth daily.   Yes Historical Provider, MD  cyclobenzaprine (FLEXERIL) 10 MG tablet Take 10 mg by mouth 3 (three) times daily as needed for muscle spasms.   Yes Historical Provider, MD  famotidine (PEPCID) 20 MG tablet Take 1 tablet (20 mg total) by mouth 2 (two) times daily. Patient taking differently: Take 20 mg by mouth daily as needed for heartburn.  08/27/13  Yes Lesle Chris Black, NP  Fluticasone Furoate-Vilanterol (BREO ELLIPTA) 100-25 MCG/INH AEPB Inhale 1 puff into the lungs daily.   Yes Historical Provider, MD  furosemide (LASIX) 20 MG tablet Take 20 mg by mouth daily.   Yes Historical  Provider, MD  HYDROcodone-acetaminophen (NORCO) 10-325 MG per tablet Take 1 tablet by mouth every 6 (six) hours as needed for moderate pain or severe pain.   Yes Historical Provider, MD  levothyroxine (SYNTHROID, LEVOTHROID) 88 MCG tablet Take 88 mcg by mouth daily before breakfast.   Yes Historical Provider, MD  lisinopril (PRINIVIL,ZESTRIL) 40 MG tablet Take 40 mg by mouth daily.   Yes Historical Provider, MD    Current Facility-Administered Medications  Medication Dose Route Frequency Provider Last Rate Last Dose  . 0.9 %  sodium chloride infusion   Intravenous Continuous Haydee Monica, MD      . ALPRAZolam Prudy Feeler) tablet 1 mg  1 mg Oral TID  PRN Standley Brooking, MD      . ciprofloxacin (CIPRO) IVPB 400 mg  400 mg Intravenous Q12H Haydee Monica, MD   400 mg at 11/16/14 1122  . clonazePAM (KLONOPIN) tablet 0.5 mg  0.5 mg Oral BID Haydee Monica, MD   0.5 mg at 11/16/14 0836  . cyclobenzaprine (FLEXERIL) tablet 10 mg  10 mg Oral 3 times per day Haydee Monica, MD   10 mg at 11/16/14 0609  . Fluticasone Furoate-Vilanterol 100-25 MCG/INH AEPB 1 puff  1 puff Inhalation Daily Standley Brooking, MD      . Melene Muller ON 11/17/2014] levothyroxine (SYNTHROID, LEVOTHROID) tablet 88 mcg  88 mcg Oral QAC breakfast Standley Brooking, MD      . metroNIDAZOLE (FLAGYL) IVPB 500 mg  500 mg Intravenous Q8H Haydee Monica, MD   500 mg at 11/16/14 0836  . morphine 2 MG/ML injection 1 mg  1 mg Intravenous Q4H PRN Haydee Monica, MD   1 mg at 11/16/14 0834  . ondansetron (ZOFRAN) tablet 4 mg  4 mg Oral Q6H PRN Haydee Monica, MD       Or  . ondansetron [facility]) injection 4 mg  4 mg Intravenous Q6H PRN Haydee Monica, MD   4 mg at 11/16/14 0610  . oxyCODONE (Oxy IR/ROXICODONE) immediate release tablet 15 mg  15 mg Oral Q4H PRN Haydee Monica, MD   15 mg at 11/16/14 0609  . OxyCODONE (OXYCONTIN) 12 hr tablet 20 mg  20 mg Oral Q12H Haydee Monica, MD   20 mg at 11/16/14 1127    Allergies as of 11/15/2014 - Review Complete 11/15/2014  Allergen Reaction Noted  . Sulfa antibiotics Anaphylaxis 08/06/2012  . Demerol [meperidine] Swelling 08/06/2012  . Penicillins Swelling 08/06/2012  . Levaquin [levofloxacin in d5w]  04/10/2014  . Neurontin [gabapentin] Other (See Comments) 11/04/2013  . Toradol [ketorolac tromethamine] Other (See Comments) 11/15/2014  . Doxycycline Nausea And Vomiting 09/21/2012   Family History  Problem Relation Age of Onset  . Diabetes Mother   . Hypertension Mother   . Arthritis Father   . Diabetes Sister   . Neuropathy Sister   . Transient ischemic attack Sister   . Heart disease Sister   . Hypertension Sister   . Cancer - Lung  Other   . Cancer - Prostate Other   . Cancer - Other Other   NO COLON CANCER OR POLYPS, OR IBD   History   Social History  . Marital Status: Married    Spouse Name: N/A    Number of Children: N/A  . Years of Education: N/A   Occupational History  . Not on file.   Social History Main Topics  . Smoking status: Current Some Day Smoker -- 0.50 packs/day  for 15 years    Types: Cigarettes  . Smokeless tobacco: Never Used  . Alcohol Use: No  . Drug Use: No  . Sexual Activity: Yes    Birth Control/ Protection: Surgical   Other Topics Concern  . Not on file   Social History Narrative    Review of Systems: PER HPI OTHERWISE ALL SYSTEMS ARE NEGATIVE.   Vitals: Blood pressure 107/54, pulse 92, temperature 98.4 F (36.9 C), temperature source Oral, resp. rate 16, height  (1.651 m), weight 122 lb 4.8 oz (55.475 kg), SpO2 95 %.  Physical Exam: General:   Alert,  Well-developed, well-nourished, pleasant and cooperative in NAD Head:  Normocephalic and atraumatic. Eyes:  Sclera clear, no icterus.   Conjunctiva pink. Mouth:  No lesions, dentition normal. Neck:  Supple; no masses. Lungs:  Clear throughout to auscultation.   No wheezes. No acute distress. Heart:  Regular rate and rhythm; no murmurs. Abdomen:  Soft,MODERATE TTP IN LUQ/LLQ, WITH GUARDING, AND REBOUND, nondistended. No masses noted. Normal bowel sounds, without guarding, and without rebound.   Extremities:  Without edema. Neurologic:  Alert and  oriented x4;  grossly normal neurologically. Cervical Nodes:  No significant cervical adenopathy. Psych:  Alert and cooperative. Normal mood and affect.  Lab Results:  Recent Labs  11/15/14 1513 11/15/14 2339 11/16/14 1120  WBC 15.6*  --   --   HGB 14.8 13.1 11.8*  HCT 43.9 39.5 35.8*  PLT 275  --   --    BMET  Recent Labs  11/15/14 1513  NA 143  K 4.5  CL 105  CO2 31  GLUCOSE 135*  BUN 15  CREATININE 0.83  CALCIUM 9.8   LFT  Recent Labs   11/15/14 1513  PROT 7.3  ALBUMIN 4.5  AST 15  ALT 19  ALKPHOS 80  BILITOT 0.7     Studies/Results: CT ABD/PLEVIS JAN 9: L COLITIS   LOS: 1 day   Maxx Calaway  11/16/2014, 1:02 PM

## 2014-11-16 NOTE — Progress Notes (Signed)
UR completed 

## 2014-11-17 LAB — CBC
HEMATOCRIT: 35.1 % — AB (ref 36.0–46.0)
Hemoglobin: 11.7 g/dL — ABNORMAL LOW (ref 12.0–15.0)
MCH: 31.2 pg (ref 26.0–34.0)
MCHC: 33.3 g/dL (ref 30.0–36.0)
MCV: 93.6 fL (ref 78.0–100.0)
Platelets: 182 10*3/uL (ref 150–400)
RBC: 3.75 MIL/uL — ABNORMAL LOW (ref 3.87–5.11)
RDW: 15 % (ref 11.5–15.5)
WBC: 9.5 10*3/uL (ref 4.0–10.5)

## 2014-11-17 LAB — BASIC METABOLIC PANEL
Anion gap: 4 — ABNORMAL LOW (ref 5–15)
BUN: 6 mg/dL (ref 6–23)
CHLORIDE: 102 meq/L (ref 96–112)
CO2: 32 mmol/L (ref 19–32)
Calcium: 8.4 mg/dL (ref 8.4–10.5)
Creatinine, Ser: 0.6 mg/dL (ref 0.50–1.10)
GFR calc Af Amer: >90 mL/min (ref >90)
GFR calc non Af Amer: >90 mL/min (ref >90)
Glucose, Bld: 105 mg/dL — ABNORMAL HIGH (ref 70–99)
POTASSIUM: 3.7 mmol/L (ref 3.5–5.1)
Sodium: 138 mmol/L (ref 135–145)

## 2014-11-17 LAB — OCCULT BLOOD, POC DEVICE: Fecal Occult Bld: POSITIVE — AB

## 2014-11-17 NOTE — Care Management Note (Addendum)
    Page 1 of 1   11/20/2014     2:53:53 PM CARE MANAGEMENT NOTE 11/20/2014  Patient:  [name], [name]   Account Number:  [id number]  Date Initiated:  11/17/2014  Documentation initiated by:  Sharrie Rothman  Subjective/Objective Assessment:   Pt admitted from home with colitis. Pt lives with her husband and will return home at discharge. Pt is independent with ADL's. Pt has a cane for prn use.     Action/Plan:   No Cm needs noted.   Anticipated DC Date:  11/19/2014   Anticipated DC Plan:  HOME/SELF CARE      DC Planning Services  CM consult      Choice offered to / List presented to:             Status of service:  Completed, signed off Medicare Important Message given?   (If response is "NO", the following Medicare IM given date fields will be blank) Date Medicare IM given:   Medicare IM given by:   Date Additional Medicare IM given:   Additional Medicare IM given by:    Discharge Disposition:  HOME/SELF CARE  Per UR Regulation:    If discussed at Long Length of Stay Meetings, dates discussed:   11/20/2014    Comments:  11/20/14 1450 Arlyss Queen, RN BSN CM Pt discharged home today. No CM needs noted.  11/17/14 1355 Arlyss Queen, RN BSN CM

## 2014-11-17 NOTE — Progress Notes (Signed)
Subjective: 4-5 small clots yesterday evening. Lower abdominal discomfort. Feels bloated. Feels like getting kicked in stomach by a [place]. Sore in RUQ. No diarrhea. Last episode of bleeding last night. Feels like rectal bleeding is tapering off. Overall feels somewhat improved from admission. Wants to drink her Dr. Reino Kent.   Objective: Vital signs in last 24 hours: Temp:  [98.6 F (37 C)-99.1 F (37.3 C)] 99.1 F (37.3 C) (01/10 2102) Pulse Rate:  [88-91] 91 (01/10 2102) Resp:  [16] 16 (01/10 2102) BP: (103-108)/(48-56) 108/48 mmHg (01/10 2102) SpO2:  [91 %-96 %] 96 % (01/10 2102) Last BM Date: 11/16/14 General:   Alert and oriented, pleasant Head:  Normocephalic and atraumatic. Eyes:  No icterus, sclera clear. Conjuctiva pink.  Abdomen:  Bowel sounds present, soft, TTP diffusely lower abdomen, non-distended. No HSM or hernias noted. No rebound or guarding. No masses appreciated  Extremities:  Without  edema. Neurologic:  Alert and  oriented x4;  grossly normal neurologically. Psych:  Alert and cooperative. Normal mood and affect.  Intake/Output from previous day: 01/10 0701 - 01/11 0700 In: 520 [P.O.:120; IV Piggyback:400] Out: 1250 [Urine:1250] Intake/Output this shift:    Lab Results:  Recent Labs  11/16/14 1553 11/16/14 2202 11/17/14 0426  WBC 10.2 10.0 9.5  HGB 11.4* 11.6* 11.7*  HCT 34.5* 34.5* 35.1*  PLT 175 176 182   BMET  Recent Labs  11/15/14 1513 11/17/14 0426  NA 143 138  K 4.5 3.7  CL 105 102  CO2 31 32  GLUCOSE 135* 105*  BUN 15 6  CREATININE 0.83 0.60  CALCIUM 9.8 8.4   LFT  Recent Labs  11/15/14 1513  PROT 7.3  ALBUMIN 4.5  AST 15  ALT 19  ALKPHOS 80  BILITOT 0.7    Studies/Results: Ct Abdomen Pelvis W Contrast  11/15/2014   CLINICAL DATA:  Several episodes rectal bleeding starting 1 day ago. Bright-red clots. Associated right lower quadrant and left lower quadrant abdominal pain and cramping with diarrhea. Nausea.  EXAM: CT  ABDOMEN AND PELVIS WITH CONTRAST  TECHNIQUE: Multidetector CT imaging of the abdomen and pelvis was performed using the standard protocol following bolus administration of intravenous contrast.  CONTRAST:  50mL OMNIPAQUE IOHEXOL 300 MG/ML SOLN, OMNIPAQUE IOHEXOL 300 MG/ML SOLN  COMPARISON:  MRI pelvis 08/04/2014. CT abdomen and pelvis 05/17/2011.  FINDINGS: The lung bases are clear.  Surgical absence of the gallbladder. Mild physiologic dilatation of bile ducts. No focal liver lesions. The pancreas, spleen, adrenal glands, kidneys, abdominal aorta, inferior vena cava, and retroperitoneal lymph nodes are unremarkable. Stomach and small bowel appear normal. Contrast material flows through to the colon without evidence of bowel obstruction. No free air or free fluid in the abdomen.  Pelvis: The colon demonstrates diffuse wall thickening and edema beginning in the mid transverse region extending throughout the descending colon to the rectosigmoid. This suggests colitis and may be infectious or inflammatory in etiology. Mild pericolonic fatty infiltration. Bladder wall is mildly thickened suggesting possible cystitis. Small amount of free fluid in the pelvis is likely reactive. Uterus appears surgically absent. No pelvic mass or lymphadenopathy. Appendix is normal. Postoperative changes with posterior fixation at L5-S1. Slight residual anterior subluxation demonstrated. No destructive bone lesions appreciated.  IMPRESSION: Colonic wall thickening and edema or extending from the mid transverse region to the descending and rectosigmoid colon. This suggests nonspecific colitis. Consider infectious or inflammatory etiologies. Bladder wall is mildly thickened suggesting possible cystitis.   Electronically Signed   By:  Burman Nieves M.D.   On: 11/15/2014 22:14    Assessment: 54 year old female admitted with vomiting, abdominal pain, and rectal bleeding, with CT scan showing colitis. Differentials include  infectious or ischemic, less likely IBD. Slow improvement noted since admission; appears GI pathogen panel has been ordered but unclear if collected. No further rectal bleeding since yesterday evening.  No indication for colonoscopy while inpatient unless she does not clinically improve.     Plan: BID PPI Antibiotics as ordered, changing to oral once diet advanced Follow-up on pending GI pathogen panel Colonoscopy 6-8 weeks as outpatient with Phenergan 25 mg IV to augment sedation Will advance diet to clear liquids.    Nira Retort, ANP-BC [facility]    LOS: 2 days    11/17/2014, 7:53 AM

## 2014-11-17 NOTE — Progress Notes (Addendum)
PROGRESS NOTE  KRISTENE GREENAN LOV:[id number] DOB: August 01, 1961 DOA: 11/15/2014 PCP: [name], MD  Summary: 54 year old woman who presented with several day history of lower abdominal cramping and 1 day history of premature blood per rectum with clots. CT showed colitis--infectious vs inflammatory. Slowly improving on abx, diet advanced to clears today, bleeding seems to have stopped and Hgb stable. Did not require blood products. Anticipate discharge 1-2 days.  Assessment/Plan: 1. Acute colitis, infectious versus versus ischemic, less likely inflammatory. Overall improving, Hgb stable. 2. Acute blood loss anemia, hematochezia, GIB. Asymptomatic, bleeding nearly stopped. 3. Generalized abdominal pain, RUQ pain. LFTs normal 2 days ago, likely colitis. Hx cholecystectomy. 4. Borderline diabetes mellitus, appears be stable. 5. History of fibromyalgia, multiple sclerosis   Slowly improving, advanced diet to clear liquids. Hgb stable, follow clinically, check CBC only if further bleeding.  BID PPI  Follow GI pathogen panel  Colonoscopy 6-8 weeks as an outpatient  Hopefully home next 1-2 days  Code Status: full code DVT prophylaxis: SCDs Family Communication:  Disposition Plan: home  Brendia Sacks, MD  [facility]  Pager [phone] If 7PM-7AM, please contact night-coverage at [email or web address], password [facility] 11/17/2014, 12:43 PM  LOS: 2 days   Consultants:    Procedures:    Antibiotics:  Ciprofloxacin 1/9 >>   Flagyl 1/9 >>  HPI/Subjective: Feels better, no n/v/diarrhea. Very little rectal bleeding. Has generalized abdominal pain especially RUQ. Tolerating liquids but very poor appetite.  Objective: Filed Vitals:   11/16/14 1443 11/16/14 2002 11/16/14 2102 11/17/14 0833  BP: 103/56  108/48   Pulse: 88  91   Temp: 98.6 F (37 C)  99.1 F (37.3 C)   TempSrc: Oral  Oral   Resp: 16  16   Height:      Weight:      SpO2: 91% 95% 96% 92%    Intake/Output Summary  (Last 24 hours) at 11/17/14 1243 Last data filed at 11/17/14 0343  Gross per 24 hour  Intake    400 ml  Output   1250 ml  Net   -850 ml     Filed Weights   11/15/14 1434 11/15/14 2300  Weight: 47.174 kg (104 lb) 55.475 kg (122 lb 4.8 oz)    Exam:     Afebrile, vital signs stable. General:  Appears calm and comfortable Cardiovascular: RRR, no m/r/g. No LE edema. Respiratory: CTA bilaterally, no w/r/r. Normal respiratory effort. Abdomen: soft, non-distended; moderate generalized tenderness, RUQ as well Skin: no rash or induration seen on abdomen Psychiatric: grossly normal mood and affect, speech fluent and appropriate  Data Reviewed:  UOP 1250  Hemoglobin 14.8  >> 13.1  >> 11.8 >> 11.7  BMP unremarkable, stable  Pertinent data:  Labs  Complete metabolic panel unremarkable on admission   WBC 15.6 on admission  Imaging   CT abdomen and pelvis: Colonic wall thickening, edema the transverse region to descending and rectosigmoid colon. Nonspecific colitis. Consider infectious or inflammatory.   Pending data:  GI pathogen panel  Scheduled Meds: . ciprofloxacin  400 mg Intravenous Q12H  . clonazePAM  0.5 mg Oral BID  . cyclobenzaprine  10 mg Oral 3 times per day  . levothyroxine  88 mcg Oral QAC breakfast  . metronidazole  500 mg Intravenous Q8H  . mometasone-formoterol  2 puff Inhalation BID  . OxyCODONE  20 mg Oral Q12H  . pantoprazole  40 mg Oral BID AC   Continuous Infusions: . sodium chloride      Principal Problem:  Colitis, acute Active Problems:   DM (diabetes mellitus)   Leucocytosis   Hypertension   COPD (chronic obstructive pulmonary disease)   Chronic pain syndrome   Spondylolisthesis of lumbar region   Hematochezia   Bilateral lower abdominal cramping   Rectal bleeding   Time spent 20 minutes

## 2014-11-18 LAB — HEPATIC FUNCTION PANEL
ALBUMIN: 3.1 g/dL — AB (ref 3.5–5.2)
ALT: 13 U/L (ref 0–35)
AST: 10 U/L (ref 0–37)
Alkaline Phosphatase: 60 U/L (ref 39–117)
Bilirubin, Direct: 0.1 mg/dL (ref 0.0–0.3)
Indirect Bilirubin: 0.1 mg/dL — ABNORMAL LOW (ref 0.3–0.9)
Total Bilirubin: 0.2 mg/dL — ABNORMAL LOW (ref 0.3–1.2)
Total Protein: 5.7 g/dL — ABNORMAL LOW (ref 6.0–8.3)

## 2014-11-18 LAB — CBC
HEMATOCRIT: 35.8 % — AB (ref 36.0–46.0)
HEMOGLOBIN: 11.7 g/dL — AB (ref 12.0–15.0)
MCH: 30.6 pg (ref 26.0–34.0)
MCHC: 32.7 g/dL (ref 30.0–36.0)
MCV: 93.7 fL (ref 78.0–100.0)
Platelets: 185 10*3/uL (ref 150–400)
RBC: 3.82 MIL/uL — ABNORMAL LOW (ref 3.87–5.11)
RDW: 14.5 % (ref 11.5–15.5)
WBC: 6.1 10*3/uL (ref 4.0–10.5)

## 2014-11-18 MED ORDER — DICLOFENAC SODIUM 1 % TD GEL
2.0000 g | Freq: Four times a day (QID) | TRANSDERMAL | Status: DC
  Administered 2014-11-18 – 2014-11-20 (×7): 2 g via TOPICAL
  Filled 2014-11-18: qty 100

## 2014-11-18 MED ORDER — OXYCODONE HCL 5 MG PO TABS
10.0000 mg | ORAL_TABLET | ORAL | Status: DC | PRN
  Administered 2014-11-18 – 2014-11-19 (×5): 10 mg via ORAL
  Filled 2014-11-18 (×5): qty 2

## 2014-11-18 MED ORDER — OXYCODONE HCL 5 MG PO TABS
10.0000 mg | ORAL_TABLET | ORAL | Status: DC | PRN
  Administered 2014-11-18: 10 mg via ORAL
  Filled 2014-11-18: qty 2

## 2014-11-18 MED ORDER — MORPHINE SULFATE 2 MG/ML IJ SOLN
2.0000 mg | INTRAMUSCULAR | Status: DC | PRN
  Administered 2014-11-18 – 2014-11-19 (×5): 2 mg via INTRAVENOUS
  Filled 2014-11-18 (×6): qty 1

## 2014-11-18 NOTE — Progress Notes (Signed)
PROGRESS NOTE  [name] IOM:[id number] DOB: 1960-11-16 DOA: 11/15/2014 PCP: [name], MD  Summary: 54 year old woman who presented with several day history of lower abdominal cramping and 1 day history of premature blood per rectum with clots. CT showed colitis--infectious vs inflammatory. Slowly improving on abx, diet advanced to clears today, bleeding seems to have stopped and Hgb stable. Did not require blood products. Anticipate discharge 1-2 days.  Assessment/Plan: 1. Acute colitis, infectious versus versus ischemic, less likely inflammatory. Continues to have pain with bloody stools. If no improvement results, will need to ask for GI for need for colonoscopy. Was evaluated by Dr Darrick Penna yesterday.  2. Acute blood loss anemia, hematochezia, GIB. Asymptomatic,- cont to follow hb closely 3. Borderline diabetes mellitus, appears be stable. 4. History of fibromyalgia, multiple sclerosis   Cont clear liquids. Hgb stable, follow clinically, check CBC daily as she continues to bleed.   BID PPI  Follow GI pathogen panel  Colonoscopy 6-8 weeks as an outpatient  Hopefully home next 1-2 days  Code Status: full code DVT prophylaxis: SCDs Family Communication:  Disposition Plan: home  Nichole Cantor, MD  [facility]  Pager [email or web address], password [facility] 11/18/2014, 1:14 PM  LOS: 3 days   Consultants:  GI   Antibiotics:  Ciprofloxacin 1/9 >>   Flagyl 1/9 >>  HPI/Subjective: Continues to pass blood with clots a few times a day. Abdominal pain improving but not resolved. Crampy pain occurs just before a a BM.   Objective: Filed Vitals:   11/17/14 1903 11/17/14 2200 11/18/14 0617 11/18/14 0740  BP:  101/51 91/53   Pulse:  88 77   Temp:  99.6 F (37.6 C) 98.7 F (37.1 C)   TempSrc:  Oral Oral   Resp:  20 20   Height:      Weight:      SpO2: 95% 95% 93% 96%    Intake/Output Summary (Last 24 hours) at 11/18/14 1314 Last data filed at 11/18/14 0520  Gross per  24 hour  Intake   1440 ml  Output   1950 ml  Net   -510 ml     Filed Weights   11/15/14 1434 11/15/14 2300  Weight: 47.174 kg (104 lb) 55.475 kg (122 lb 4.8 oz)    Exam:     Afebrile, vital signs stable. General:  Appears calm and comfortable Cardiovascular: RRR, no m/r/g. No LE edema. Respiratory: CTA bilaterally, no w/r/r. Normal respiratory effort. Abdomen: soft, non-distended; tender in RLQ today Skin: no rash or induration seen on abdomen Psychiatric: grossly normal mood and affect, speech fluent and appropriate  Data Reviewed:  UOP 1250  Hemoglobin 14.8  >> 13.1  >> 11.8 >> 11.7  BMP unremarkable, stable  Pertinent data:  Labs  Complete metabolic panel unremarkable on admission   WBC 15.6 on admission  Imaging   CT abdomen and pelvis: Colonic wall thickening, edema the transverse region to descending and rectosigmoid colon. Nonspecific colitis. Consider infectious or inflammatory.   Pending data:  GI pathogen panel  Scheduled Meds: . ciprofloxacin  400 mg Intravenous Q12H  . clonazePAM  0.5 mg Oral BID  . cyclobenzaprine  10 mg Oral 3 times per day  . diclofenac sodium  2 g Topical QID  . levothyroxine  88 mcg Oral QAC breakfast  . metronidazole  500 mg Intravenous Q8H  . mometasone-formoterol  2 puff Inhalation BID  . OxyCODONE  20 mg Oral Q12H  . pantoprazole  40 mg Oral BID AC  Continuous Infusions:      Time spent 30 minutes

## 2014-11-18 NOTE — Progress Notes (Signed)
    Subjective: States she had small amount of blood clots yesterday and yesterday evening. States RUQ discomfort, feels like she pulled a muscle. Still with cramps in lower abdomen. Would like more substance to food.   Objective: Vital signs in last 24 hours: Temp:  [98.7 F (37.1 C)-99.6 F (37.6 C)] 98.7 F (37.1 C) (01/12 0617) Pulse Rate:  [77-93] 77 (01/12 0617) Resp:  [16-20] 20 (01/12 0617) BP: (91-103)/(51-57) 91/53 mmHg (01/12 0617) SpO2:  [92 %-96 %] 96 % (01/12 0740) Last BM Date: 11/16/14 General:   Alert and oriented, pleasant Head:  Normocephalic and atraumatic. Abdomen:  Bowel sounds present, soft, TTP lower abdomen, point TTP right costal margin.  No rebound or guarding. Extremities:  Without edema. Neurologic:  Alert and  oriented x4;  grossly normal neurologically. Psych:  Alert and cooperative. Normal mood and affect.  Intake/Output from previous day: 01/11 0701 - 01/12 0700 In: 1920 [P.O.:720; I.V.:800; IV Piggyback:400] Out: 2550 [Urine:2550] Intake/Output this shift:    Lab Results:  Recent Labs  11/16/14 1553 11/16/14 2202 11/17/14 0426  WBC 10.2 10.0 9.5  HGB 11.4* 11.6* 11.7*  HCT 34.5* 34.5* 35.1*  PLT 175 176 182   BMET  Recent Labs  11/15/14 1513 11/17/14 0426  NA 143 138  K 4.5 3.7  CL 105 102  CO2 31 32  GLUCOSE 135* 105*  BUN 15 6  CREATININE 0.83 0.60  CALCIUM 9.8 8.4   LFT  Recent Labs  11/15/14 1513  PROT 7.3  ALBUMIN 4.5  AST 15  ALT 19  ALKPHOS 80  BILITOT 0.7    Assessment: 54 year old female admitted with vomiting, abdominal pain, and rectal bleeding, with CT scan showing colitis. Differentials include infectious or ischemic, less likely IBD. Slow improvement noted since admission; appears GI pathogen panel has been ordered but unclear if collected. Still notes small clots per rectum but Hgb has remained stable.No indication for colonoscopy while inpatient unless she does not clinically improve. Requesting  advancement of diet. Will trial full liquids.   RUQ discomfort: point tenderness noted to right costal margin. Query musculoskeletal origin. Gallbladder absent. LFTs normal on admission. Recheck now.   Plan: BID PPI Full liquids HFP Colonoscopy in 6-8 weeks with Phenergan 25 mg IV to augment sedation; inpatient if patient does not clinically improve   Nira Retort, ANP-BC [facility]    LOS: 3 days    11/18/2014, 8:05 AM

## 2014-11-19 DIAGNOSIS — D62 Acute posthemorrhagic anemia: Secondary | ICD-10-CM | POA: Insufficient documentation

## 2014-11-19 DIAGNOSIS — E119 Type 2 diabetes mellitus without complications: Secondary | ICD-10-CM

## 2014-11-19 MED ORDER — CYCLOBENZAPRINE HCL 10 MG PO TABS
10.0000 mg | ORAL_TABLET | Freq: Three times a day (TID) | ORAL | Status: DC | PRN
  Administered 2014-11-20 (×2): 10 mg via ORAL
  Filled 2014-11-19 (×2): qty 1

## 2014-11-19 MED ORDER — HYDROCODONE-ACETAMINOPHEN 10-325 MG PO TABS
1.0000 | ORAL_TABLET | ORAL | Status: DC | PRN
  Administered 2014-11-19 – 2014-11-20 (×3): 1 via ORAL
  Filled 2014-11-19 (×3): qty 1

## 2014-11-19 NOTE — Progress Notes (Signed)
Subjective:  Report 10-15 stools in the last 24 hours. 5-6 since midnight. Mostly blood, with small blood clots on tissue. Complains of "getting in a fight with a gorilla". Diffuse abdominal pain, unchanged since admission but thinks her stool frequency and amount of bleeding is improved.  Objective: Vital signs in last 24 hours: Temp:  [97.4 F (36.3 C)-98.2 F (36.8 C)] 97.4 F (36.3 C) (01/13 0546) Pulse Rate:  [72-82] 72 (01/13 0546) Resp:  [20] 20 (01/13 0546) BP: (100-133)/(47-72) 133/63 mmHg (01/13 0546) SpO2:  [93 %-100 %] 100 % (01/13 0713) Last BM Date: 11/16/14 General:   Alert,  Well-developed, well-nourished, pleasant and cooperative in NAD Head:  Normocephalic and atraumatic. Eyes:  Sclera clear, no icterus.  Abdomen:  Soft, diffuse tenderness, nondistended.  No guarding, and without rebound.   Extremities:  Without clubbing, deformity or edema. Neurologic:  Alert and  oriented x4;  grossly normal neurologically. Skin:  Intact without significant lesions or rashes. Psych:  Alert and cooperative. Normal mood and affect.  Intake/Output from previous day: 01/12 0701 - 01/13 0700 In: 1020 [P.O.:720; IV Piggyback:300] Out: 4050 [Urine:4050] Intake/Output this shift: Total I/O In: -  Out: 400 [Urine:400]  Lab Results: CBC  Recent Labs  11/16/14 2202 11/17/14 0426 11/18/14 1027  WBC 10.0 9.5 6.1  HGB 11.6* 11.7* 11.7*  HCT 34.5* 35.1* 35.8*  MCV 93.2 93.6 93.7  PLT 176 182 185   BMET  Recent Labs  11/17/14 0426  NA 138  K 3.7  CL 102  CO2 32  GLUCOSE 105*  BUN 6  CREATININE 0.60  CALCIUM 8.4   LFTs  Recent Labs  11/18/14 1027  BILITOT 0.2*  BILIDIR 0.1  IBILI 0.1*  ALKPHOS 60  AST 10  ALT 13  PROT 5.7*  ALBUMIN 3.1*   No results for input(s): LIPASE in the last 72 hours. PT/INR No results for input(s): LABPROT, INR in the last 72 hours.    Imaging Studies: Ct Abdomen Pelvis W Contrast  11/15/2014   CLINICAL DATA:  Several  episodes rectal bleeding starting 1 day ago. Bright-red clots. Associated right lower quadrant and left lower quadrant abdominal pain and cramping with diarrhea. Nausea.  EXAM: CT ABDOMEN AND PELVIS WITH CONTRAST  TECHNIQUE: Multidetector CT imaging of the abdomen and pelvis was performed using the standard protocol following bolus administration of intravenous contrast.  CONTRAST:  50mL OMNIPAQUE IOHEXOL 300 MG/ML SOLN, OMNIPAQUE IOHEXOL 300 MG/ML SOLN  COMPARISON:  MRI pelvis 08/04/2014. CT abdomen and pelvis 05/17/2011.  FINDINGS: The lung bases are clear.  Surgical absence of the gallbladder. Mild physiologic dilatation of bile ducts. No focal liver lesions. The pancreas, spleen, adrenal glands, kidneys, abdominal aorta, inferior vena cava, and retroperitoneal lymph nodes are unremarkable. Stomach and small bowel appear normal. Contrast material flows through to the colon without evidence of bowel obstruction. No free air or free fluid in the abdomen.  Pelvis: The colon demonstrates diffuse wall thickening and edema beginning in the mid transverse region extending throughout the descending colon to the rectosigmoid. This suggests colitis and may be infectious or inflammatory in etiology. Mild pericolonic fatty infiltration. Bladder wall is mildly thickened suggesting possible cystitis. Small amount of free fluid in the pelvis is likely reactive. Uterus appears surgically absent. No pelvic mass or lymphadenopathy. Appendix is normal. Postoperative changes with posterior fixation at L5-S1. Slight residual anterior subluxation demonstrated. No destructive bone lesions appreciated.  IMPRESSION: Colonic wall thickening and edema or extending from the mid transverse  region to the descending and rectosigmoid colon. This suggests nonspecific colitis. Consider infectious or inflammatory etiologies. Bladder wall is mildly thickened suggesting possible cystitis.   Electronically Signed   By: Burman Nieves M.D.    On: 11/15/2014 22:14  [2 weeks]   Assessment:  54 year old female admitted with vomiting, abdominal pain, and rectal bleeding, with CT scan showing colitis. Differentials include infectious or ischemic, less likely IBD. Slow improvement noted since admission; appears GI pathogen panel pending. Still notes small clots per rectum but Hgb has remained stable.No indication for colonoscopy while inpatient unless she does not clinically improve. Currently day 4. To discuss with Dr. Jena Gauss.     Plan: 1. Await GI pathogen panel results. Continue supportive measures.  2. Slow clinical improvement. To discuss with Dr. Jena Gauss. Patient has maintained stable hemoglobin for past 3 days. No leukocytosis.   Leanna Battles. [facility] 1/13/20169:20 AM  Attending note:  Patient seen and examined this afternoon. Feels that frequency of stooling may have diminished a little today. Tolerating diet although she does not like hospital food. Will reassess tomorrow morning.   LOS: 4 days

## 2014-11-19 NOTE — Progress Notes (Signed)
[facility] PROGRESS NOTE  [name]:[id number] DOB: December 29, 1960 DOA: 11/15/2014 PCP: [name], MD  Assessment/Plan: 1. Acute colitis. Ischemic versus infectious. Patient is currently on intravenous antibiotics. Stool for GI pathogen panel is in process. She's been afebrile. She has normal WBC count. We'll likely transition to oral antibiotics tomorrow. Plans are for likely outpatient colonoscopy. She is currently tolerating a solid diet. 2. Acute blood loss anemia, hematochezia, likely related to colitis. Hemoglobin has since been stable. Has not required transfusion. Continue to follow. 3. Borderline diabetes mellitus. Appears to be stable 4. History of fibromyalgia and multiple sclerosis. 5. Abdominal pain. Likely related to colitis. She appears to be tolerating a solid diet. We'll transition to oral pain medications. I discussed the patient's care with her daughter who expressed concerns that the patient is being overmedicated. She reports that the patient called her daughter overnight and was "talking out of her head". The patient is repeatedly falling asleep during my visit today. Will place her back on her outpatient dose of Norco. Her daughter requested that I not inform the patient of our conversation, since patient frequently becomes very upset when discussing any issues regarding her pain medication regimen. Discontinue morphine, OxyContin and oxycodone when necessary. 6. Anxiety. Confirmed with patient's pharmacy that she is only getting Xanax, and she is not on Klonopin. Will discontinue Klonopin at this time.   Code Status: Full code Family Communication: Discussed with patient. Separately discussed with daughter Disposition Plan: Discharge home, possibly in a.m.   Consultants:  Gastroenterology  Procedures:    Antibiotics:  Ciprofloxacin 1/9>>  Flagyl 1/9>>  HPI/Subjective: Feeling a little better. Reports continued pain in abdomen. Able to eat without  vomiting. Still having some nausea. Reports that stools are less loose.  Objective: Filed Vitals:   11/19/14 1320  BP: 119/64  Pulse: 76  Temp: 97.3 F (36.3 C)  Resp: 18    Intake/Output Summary (Last 24 hours) at 11/19/14 1851 Last data filed at 11/19/14 1835  Gross per 24 hour  Intake   1180 ml  Output   2550 ml  Net  -1370 ml   Filed Weights   11/15/14 1434 11/15/14 2300  Weight: 47.174 kg (104 lb) 55.475 kg (122 lb 4.8 oz)    Exam:   General:  NAD  Cardiovascular: S1, s2 RRR  Respiratory:  CTA B  Abdomen: soft, diffuse tenderness, no gaurding   Musculoskeletal: no edema b/l   Data Reviewed: Basic Metabolic Panel:  Recent Labs Lab 11/15/14 1513 11/17/14 0426  NA 143 138  K 4.5 3.7  CL 105 102  CO2 31 32  GLUCOSE 135* 105*  BUN 15 6  CREATININE 0.83 0.60  CALCIUM 9.8 8.4   Liver Function Tests:  Recent Labs Lab 11/15/14 1513 11/18/14 1027  AST 15 10  ALT 19 13  ALKPHOS 80 60  BILITOT 0.7 0.2*  PROT 7.3 5.7*  ALBUMIN 4.5 3.1*   No results for input(s): LIPASE, AMYLASE in the last 168 hours. No results for input(s): AMMONIA in the last 168 hours. CBC:  Recent Labs Lab 11/15/14 1513  11/16/14 1120 11/16/14 1553 11/16/14 2202 11/17/14 0426 11/18/14 1027  WBC 15.6*  --   --  10.2 10.0 9.5 6.1  NEUTROABS 12.5*  --   --   --   --   --   --   HGB 14.8  < > 11.8* 11.4* 11.6* 11.7* 11.7*  HCT 43.9  < > 35.8* 34.5* 34.5* 35.1* 35.8*  MCV 91.1  --   --  93.0 93.2 93.6 93.7  PLT 275  --   --  175 176 182 185  < > = values in this interval not displayed. Cardiac Enzymes: No results for input(s): CKTOTAL, CKMB, CKMBINDEX, TROPONINI in the last 168 hours. BNP (last 3 results) No results for input(s): PROBNP in the last 8760 hours. CBG: No results for input(s): GLUCAP in the last 168 hours.  No results found for this or any previous visit (from the past 240 hour(s)).   Studies: No results found.  Scheduled Meds: . ciprofloxacin  400  mg Intravenous Q12H  . diclofenac sodium  2 g Topical QID  . levothyroxine  88 mcg Oral QAC breakfast  . metronidazole  500 mg Intravenous Q8H  . mometasone-formoterol  2 puff Inhalation BID  . pantoprazole  40 mg Oral BID AC   Continuous Infusions:   Principal Problem:   Colitis, acute Active Problems:   DM (diabetes mellitus)   Leucocytosis   Hypertension   COPD (chronic obstructive pulmonary disease)   Chronic pain syndrome   Spondylolisthesis of lumbar region   Hematochezia   Bilateral lower abdominal cramping   Rectal bleeding    Time spent:    [facility]  [facility] Pager [phone]. If 7PM-7AM, please contact night-coverage at [email or web address], password [facility] 11/19/2014, 6:51 PM  LOS: 4 days

## 2014-11-19 NOTE — Progress Notes (Signed)
Pt has refused her scheduled dose of oxycontin. Pt states that she prefers the /mL IV dosage for her pain control.

## 2014-11-20 DIAGNOSIS — G894 Chronic pain syndrome: Secondary | ICD-10-CM

## 2014-11-20 DIAGNOSIS — K625 Hemorrhage of anus and rectum: Secondary | ICD-10-CM

## 2014-11-20 DIAGNOSIS — K529 Noninfective gastroenteritis and colitis, unspecified: Secondary | ICD-10-CM

## 2014-11-20 DIAGNOSIS — I10 Essential (primary) hypertension: Secondary | ICD-10-CM

## 2014-11-20 DIAGNOSIS — D62 Acute posthemorrhagic anemia: Secondary | ICD-10-CM

## 2014-11-20 LAB — CBC
HEMATOCRIT: 34.9 % — AB (ref 36.0–46.0)
Hemoglobin: 11.7 g/dL — ABNORMAL LOW (ref 12.0–15.0)
MCH: 30.8 pg (ref 26.0–34.0)
MCHC: 33.5 g/dL (ref 30.0–36.0)
MCV: 91.8 fL (ref 78.0–100.0)
PLATELETS: 233 10*3/uL (ref 150–400)
RBC: 3.8 MIL/uL — AB (ref 3.87–5.11)
RDW: 14.3 % (ref 11.5–15.5)
WBC: 4.6 10*3/uL (ref 4.0–10.5)

## 2014-11-20 MED ORDER — HYDROCODONE-ACETAMINOPHEN 10-325 MG PO TABS: 1.0000 | ORAL_TABLET | ORAL | Status: DC | PRN

## 2014-11-20 MED ORDER — ONDANSETRON HCL 4 MG PO TABS: 4.0000 mg | ORAL_TABLET | Freq: Four times a day (QID) | ORAL | Status: DC | PRN

## 2014-11-20 MED ORDER — FAMOTIDINE 20 MG PO TABS: 20.0000 mg | ORAL_TABLET | Freq: Every day | ORAL | Status: DC | PRN

## 2014-11-20 MED ORDER — PANTOPRAZOLE SODIUM 40 MG PO TBEC: 40.0000 mg | DELAYED_RELEASE_TABLET | Freq: Every day | ORAL | Status: DC

## 2014-11-20 MED ORDER — METRONIDAZOLE 500 MG PO TABS: 500.0000 mg | ORAL_TABLET | Freq: Three times a day (TID) | ORAL | Status: DC

## 2014-11-20 MED ORDER — CIPROFLOXACIN HCL 500 MG PO TABS: 500.0000 mg | ORAL_TABLET | Freq: Two times a day (BID) | ORAL | Status: DC

## 2014-11-20 NOTE — Discharge Summary (Signed)
Physician Discharge Summary  [name] ZOX:[id number] DOB: Dec 25, 1960 DOA: 11/15/2014  PCP: [name], MD  Admit date: 11/15/2014 Discharge date: 11/20/2014  Time spent:40 minutes  Recommendations for Outpatient Follow-up:  1. Follow-up with gastroenterology in 6-8 weeks for consideration of colonoscopy  Discharge Diagnoses:  Principal Problem:   Colitis, acute Active Problems:   DM (diabetes mellitus)   Leucocytosis   Hypertension   COPD (chronic obstructive pulmonary disease)   Chronic pain syndrome   Spondylolisthesis of lumbar region   Hematochezia   Bilateral lower abdominal cramping   Rectal bleeding   Acute blood loss anemia   Discharge Condition: Objectively improved  Diet recommendation: Low-salt, low carb  Filed Weights   11/15/14 1434 11/15/14 2300  Weight: 47.174 kg (104 lb) 55.475 kg (122 lb 4.8 oz)    History of present illness:  This patient was admitted to the hospital with bloody stools. CT imaging of the abdomen indicated colitis. She was admitted to the hospital for further treatments.  Hospital Course:  Patient was started on ciprofloxacin and Flagyl. GI pathogen panel was sent on 1/11 and is currently in process. She's not had any fevers and has clinically improved with antibiotic therapy. It was felt that her colitis was likely infectious versus ischemic in nature. Recommendations by gastroenterology were to complete a total of 10 days of antibiotics. She's not had any further bleeding in her stools. Her hemoglobin did mildly decreased after admission but has since been stable. She was advised not to use any NSAIDs after discharge. Patient is not tolerating his solid diet. She does still complain of some abdominal pain, but she may have a component of chronic pain. Objectively, she appears to be clinically improved. She will follow-up with gastroenterology in the outpatient setting in 6-8 weeks for consideration of colonoscopy. The remainder of her  medical problems remain stable. She is otherwise stable for discharge.  Procedures:    Consultations:  Gastroenterology  Discharge Exam: Filed Vitals:   11/20/14 1432  BP: 119/73  Pulse: 76  Temp: 98.2 F (36.8 C)  Resp: 18    General: No acute distress Cardiovascular: S1, S2, regular rate and rhythm Respiratory: Clear to auscultation bilaterally Abdomen: Mild tenderness diffusely, soft, no guarding  Discharge Instructions   Discharge Instructions    Call MD for:  persistant nausea and vomiting    Complete by:  As directed      Call MD for:  severe uncontrolled pain    Complete by:  As directed      Call MD for:  temperature >100.4    Complete by:  As directed      Diet - low sodium heart healthy    Complete by:  As directed      Increase activity slowly    Complete by:  As directed           Discharge Medication List as of 11/20/2014  3:54 PM    START taking these medications   Details  ciprofloxacin (CIPRO) 500 MG tablet Take 1 tablet (500 mg total) by mouth 2 (two) times daily., Starting 11/20/2014, Until Discontinued, Print    metroNIDAZOLE (FLAGYL) 500 MG tablet Take 1 tablet (500 mg total) by mouth 3 (three) times daily., Starting 11/20/2014, Until Discontinued, Print    ondansetron (ZOFRAN) 4 MG tablet Take 1 tablet (4 mg total) by mouth every 6 (six) hours as needed for nausea., Starting 11/20/2014, Until Discontinued, Print    pantoprazole (PROTONIX) 40 MG tablet Take 1  tablet (40 mg total) by mouth daily., Starting 11/20/2014, Until Discontinued, Normal      CONTINUE these medications which have CHANGED   Details  famotidine (PEPCID) 20 MG tablet Take 1 tablet (20 mg total) by mouth daily as needed for heartburn., Starting 11/20/2014, Until Discontinued, OTC    HYDROcodone-acetaminophen (NORCO) 10-325 MG per tablet Take 1 tablet by mouth every 4 (four) hours as needed for moderate pain or severe pain., Starting 11/20/2014, Until Discontinued, Print       CONTINUE these medications which have NOT CHANGED   Details  albuterol (PROVENTIL HFA;VENTOLIN HFA) 108 (90 BASE) MCG/ACT inhaler Inhale 2 puffs into the lungs every 6 (six) hours as needed. Shortness of breath., Until Discontinued, Historical Med    ALPRAZolam (XANAX) 1 MG tablet Take 1 mg by mouth 3 (three) times daily as needed for anxiety., Until Discontinued, Historical Med    citalopram (CELEXA) 40 MG tablet Take 40 mg by mouth daily., Until Discontinued, Historical Med    cyclobenzaprine (FLEXERIL) 10 MG tablet Take 10 mg by mouth 3 (three) times daily as needed for muscle spasms., Until Discontinued, Historical Med    Fluticasone Furoate-Vilanterol (BREO ELLIPTA) 100-25 MCG/INH AEPB Inhale 1 puff into the lungs daily., Until Discontinued, Historical Med    furosemide (LASIX) 20 MG tablet Take 20 mg by mouth daily., Until Discontinued, Historical Med    levothyroxine (SYNTHROID, LEVOTHROID) 88 MCG tablet Take 88 mcg by mouth daily before breakfast., Until Discontinued, Historical Med    lisinopril (PRINIVIL,ZESTRIL) 40 MG tablet Take 40 mg by mouth daily., Until Discontinued, Historical Med      STOP taking these medications     aspirin EC 81 MG tablet        Allergies  Allergen Reactions  . Sulfa Antibiotics Anaphylaxis  . Demerol [Meperidine] Swelling  . Penicillins Swelling  . Levaquin [Levofloxacin In D5w]     Abdominal pain  . Neurontin [Gabapentin] Other (See Comments)    Abdominal pain  . Toradol [Ketorolac Tromethamine] Other (See Comments)    "Feels like my whole body is on fire."  . Doxycycline Nausea And Vomiting        Follow-up Information    Follow up with Eula Listen, MD.   Specialty:  Gastroenterology   Why:  call for appointment in 6-8 weeks   Contact information:   [address] [place] [address] [phone]       Follow up with [name], MD. Schedule an appointment as soon as possible for a visit in 2 weeks.   Specialty:   Internal Medicine   Contact information:    [address]  [place] [address] [phone]        The results of significant diagnostics from this hospitalization (including imaging, microbiology, ancillary and laboratory) are listed below for reference.    Significant Diagnostic Studies: Ct Abdomen Pelvis W Contrast  11/15/2014   CLINICAL DATA:  Several episodes rectal bleeding starting 1 day ago. Bright-red clots. Associated right lower quadrant and left lower quadrant abdominal pain and cramping with diarrhea. Nausea.  EXAM: CT ABDOMEN AND PELVIS WITH CONTRAST  TECHNIQUE: Multidetector CT imaging of the abdomen and pelvis was performed using the standard protocol following bolus administration of intravenous contrast.  CONTRAST:  31mL OMNIPAQUE IOHEXOL 300 MG/ML SOLN, OMNIPAQUE IOHEXOL 300 MG/ML SOLN  COMPARISON:  MRI pelvis 08/04/2014. CT abdomen and pelvis 05/17/2011.  FINDINGS: The lung bases are clear.  Surgical absence of the gallbladder. Mild physiologic dilatation  of bile ducts. No focal liver lesions. The pancreas, spleen, adrenal glands, kidneys, abdominal aorta, inferior vena cava, and retroperitoneal lymph nodes are unremarkable. Stomach and small bowel appear normal. Contrast material flows through to the colon without evidence of bowel obstruction. No free air or free fluid in the abdomen.  Pelvis: The colon demonstrates diffuse wall thickening and edema beginning in the mid transverse region extending throughout the descending colon to the rectosigmoid. This suggests colitis and may be infectious or inflammatory in etiology. Mild pericolonic fatty infiltration. Bladder wall is mildly thickened suggesting possible cystitis. Small amount of free fluid in the pelvis is likely reactive. Uterus appears surgically absent. No pelvic mass or lymphadenopathy. Appendix is normal. Postoperative changes with posterior fixation at L5-S1. Slight residual anterior subluxation demonstrated. No  destructive bone lesions appreciated.  IMPRESSION: Colonic wall thickening and edema or extending from the mid transverse region to the descending and rectosigmoid colon. This suggests nonspecific colitis. Consider infectious or inflammatory etiologies. Bladder wall is mildly thickened suggesting possible cystitis.   Electronically Signed   By: Burman Nieves M.D.   On: 11/15/2014 22:14    Microbiology: No results found for this or any previous visit (from the past 240 hour(s)).   Labs: Basic Metabolic Panel:  Recent Labs Lab 11/15/14 1513 11/17/14 0426  NA 143 138  K 4.5 3.7  CL 105 102  CO2 31 32  GLUCOSE 135* 105*  BUN 15 6  CREATININE 0.83 0.60  CALCIUM 9.8 8.4   Liver Function Tests:  Recent Labs Lab 11/15/14 1513 11/18/14 1027  AST 15 10  ALT 19 13  ALKPHOS 80 60  BILITOT 0.7 0.2*  PROT 7.3 5.7*  ALBUMIN 4.5 3.1*   No results for input(s): LIPASE, AMYLASE in the last 168 hours. No results for input(s): AMMONIA in the last 168 hours. CBC:  Recent Labs Lab 11/15/14 1513  11/16/14 1553 11/16/14 2202 11/17/14 0426 11/18/14 1027 11/20/14 0602  WBC 15.6*  --  10.2 10.0 9.5 6.1 4.6  NEUTROABS 12.5*  --   --   --   --   --   --   HGB 14.8  < > 11.4* 11.6* 11.7* 11.7* 11.7*  HCT 43.9  < > 34.5* 34.5* 35.1* 35.8* 34.9*  MCV 91.1  --  93.0 93.2 93.6 93.7 91.8  PLT 275  --  175 176 182 185 233  < > = values in this interval not displayed. Cardiac Enzymes: No results for input(s): CKTOTAL, CKMB, CKMBINDEX, TROPONINI in the last 168 hours. BNP: BNP (last 3 results) No results for input(s): PROBNP in the last 8760 hours. CBG: No results for input(s): GLUCAP in the last 168 hours.     Signed:  Blimie Vaness  [facility] 11/20/2014, 8:01 PM

## 2014-11-20 NOTE — Progress Notes (Signed)
[name] discharged home with husband per MD order.  Discharge instructions reviewed and discussed with the patient, all questions and concerns answered. Copy of instructions and scripts given to patient.    Medication List    STOP taking these medications        aspirin EC 81 MG tablet      TAKE these medications        albuterol 108 (90 BASE) MCG/ACT inhaler  Commonly known as:  PROVENTIL HFA;VENTOLIN HFA  Inhale 2 puffs into the lungs every 6 (six) hours as needed. Shortness of breath.     ALPRAZolam 1 MG tablet  Commonly known as:  XANAX  Take 1 mg by mouth 3 (three) times daily as needed for anxiety.     BREO ELLIPTA 100-25 MCG/INH Aepb  Generic drug:  Fluticasone Furoate-Vilanterol  Inhale 1 puff into the lungs daily.     ciprofloxacin 500 MG tablet  Commonly known as:  CIPRO  Take 1 tablet (500 mg total) by mouth 2 (two) times daily.     citalopram 40 MG tablet  Commonly known as:  CELEXA  Take 40 mg by mouth daily.     cyclobenzaprine 10 MG tablet  Commonly known as:  FLEXERIL  Take 10 mg by mouth 3 (three) times daily as needed for muscle spasms.     famotidine 20 MG tablet  Commonly known as:  PEPCID  Take 1 tablet (20 mg total) by mouth daily as needed for heartburn.     furosemide 20 MG tablet  Commonly known as:  LASIX  Take 20 mg by mouth daily.     HYDROcodone-acetaminophen 10-325 MG per tablet  Commonly known as:  NORCO  Take 1 tablet by mouth every 4 (four) hours as needed for moderate pain or severe pain.     levothyroxine 88 MCG tablet  Commonly known as:  SYNTHROID, LEVOTHROID  Take 88 mcg by mouth daily before breakfast.     lisinopril 40 MG tablet  Commonly known as:  PRINIVIL,ZESTRIL  Take 40 mg by mouth daily.     metroNIDAZOLE 500 MG tablet  Commonly known as:  FLAGYL  Take 1 tablet (500 mg total) by mouth 3 (three) times daily.     ondansetron 4 MG tablet  Commonly known as:  ZOFRAN  Take 1 tablet (4 mg total) by mouth  every 6 (six) hours as needed for nausea.     pantoprazole 40 MG tablet  Commonly known as:  PROTONIX  Take 1 tablet (40 mg total) by mouth daily.        Patients skin is clean, dry and intact, no evidence of skin break down. IV site discontinued and catheter remains intact. Site without signs and symptoms of complications. Dressing and pressure applied.  Patient escorted to car by [place], NT in a wheelchair,  no distress noted upon discharge.  Ubaldo Glassing 11/20/2014 4:08 PM

## 2014-11-21 ENCOUNTER — Encounter: Payer: Self-pay | Admitting: Gastroenterology

## 2014-11-21 ENCOUNTER — Telehealth: Payer: Self-pay | Admitting: Gastroenterology

## 2014-11-21 NOTE — Telephone Encounter (Signed)
APPOINTMENT MADE AND LETTER SENT °

## 2014-11-21 NOTE — Progress Notes (Signed)
UR chart review completed.  

## 2014-11-21 NOTE — Telephone Encounter (Signed)
Patient needs hospital follow up in 4 weeks to discuss colonoscopy.

## 2014-11-24 LAB — MISCELLANEOUS TEST

## 2014-11-24 LAB — GI PATHOGEN PANEL BY PCR, STOOL

## 2014-12-09 ENCOUNTER — Telehealth: Payer: Self-pay | Admitting: Gastroenterology

## 2014-12-09 MED ORDER — HYDROCORTISONE 2.5 % RE CREA: 1.0000 "application " | TOPICAL_CREAM | Freq: Two times a day (BID) | RECTAL | Status: DC

## 2014-12-09 NOTE — Addendum Note (Signed)
Addended by: Nira Retort on: 12/09/2014 04:18 PM   Modules accepted: Orders

## 2014-12-09 NOTE — Telephone Encounter (Signed)
Office visit is needed. I will send in Anusol suppositories. May take Miralax daily as needed for constipation. Really needs to find a way to be seen sooner here if symptoms are this bad.

## 2014-12-09 NOTE — Telephone Encounter (Signed)
I called and informed pt. She is aware she should try to get transportation to come in sooner. She will check it out and let me know.

## 2014-12-09 NOTE — Telephone Encounter (Signed)
PATIENT HAVING ABD PAIN AND WANTING TO KNOW IF THERE IS ANYTHING SHE CAN DO PRIOR TO HER OV 12/2014  PLEASE ADVISE

## 2014-12-09 NOTE — Telephone Encounter (Signed)
I called pt. She said her lower abdomen hurts so bad and it has for the last few weeks. She was recently in the hospital and has been having rectal bleeding. She still has some about 3 times everyday, when she does have very small BM's. Said she has been constipated some. She is requesting something for pain. I told her that we do not do pain meds, and since we have not seen her in the office she will probably need to address with PCP. She started crying and said he said for her to check with [place] since she is scheduled to come in on 12/22/2014 at 9:00 AM. She said she didn't know who was supposed to do what with each saying the other should. We had a cancellation at 2:00 pm tomorrow with Gerrit Halls, NP and I offered it to her. She said she did not have a way. Her husband works everyday and her children live out of town. Her best friend died recently and she has no neighbors that would bring her. Her daughter is coming in from [place] to bring her on 12/22/2014.  Please advise!

## 2014-12-10 NOTE — Telephone Encounter (Signed)
REVIEWED. AGREE. NO ADDITIONAL RECOMMENDATIONS. 

## 2014-12-11 ENCOUNTER — Ambulatory Visit: Payer: Self-pay | Admitting: Nurse Practitioner

## 2014-12-14 ENCOUNTER — Emergency Department (HOSPITAL_COMMUNITY): Payer: 59

## 2014-12-14 ENCOUNTER — Emergency Department (HOSPITAL_COMMUNITY)
Admission: EM | Admit: 2014-12-14 | Discharge: 2014-12-14 | Disposition: A | Discharge status: Home / Self Care | Payer: 59 | Attending: Emergency Medicine | Admitting: Emergency Medicine

## 2014-12-14 ENCOUNTER — Encounter (HOSPITAL_COMMUNITY): Payer: Self-pay

## 2014-12-14 DIAGNOSIS — S39012A Strain of muscle, fascia and tendon of lower back, initial encounter: Secondary | ICD-10-CM | POA: Diagnosis not present

## 2014-12-14 DIAGNOSIS — S8001XA Contusion of right knee, initial encounter: Secondary | ICD-10-CM | POA: Diagnosis not present

## 2014-12-14 DIAGNOSIS — F329 Major depressive disorder, single episode, unspecified: Secondary | ICD-10-CM | POA: Diagnosis not present

## 2014-12-14 DIAGNOSIS — Z792 Long term (current) use of antibiotics: Secondary | ICD-10-CM | POA: Diagnosis not present

## 2014-12-14 DIAGNOSIS — Z79899 Other long term (current) drug therapy: Secondary | ICD-10-CM | POA: Diagnosis not present

## 2014-12-14 DIAGNOSIS — I509 Heart failure, unspecified: Secondary | ICD-10-CM | POA: Diagnosis not present

## 2014-12-14 DIAGNOSIS — Z9889 Other specified postprocedural states: Secondary | ICD-10-CM | POA: Insufficient documentation

## 2014-12-14 DIAGNOSIS — Z87442 Personal history of urinary calculi: Secondary | ICD-10-CM | POA: Diagnosis not present

## 2014-12-14 DIAGNOSIS — S161XXA Strain of muscle, fascia and tendon at neck level, initial encounter: Secondary | ICD-10-CM

## 2014-12-14 DIAGNOSIS — E119 Type 2 diabetes mellitus without complications: Secondary | ICD-10-CM | POA: Diagnosis not present

## 2014-12-14 DIAGNOSIS — Z880 Allergy status to penicillin: Secondary | ICD-10-CM | POA: Insufficient documentation

## 2014-12-14 DIAGNOSIS — S199XXA Unspecified injury of neck, initial encounter: Secondary | ICD-10-CM | POA: Diagnosis present

## 2014-12-14 DIAGNOSIS — Z86010 Personal history of colonic polyps: Secondary | ICD-10-CM | POA: Insufficient documentation

## 2014-12-14 DIAGNOSIS — S7001XA Contusion of right hip, initial encounter: Secondary | ICD-10-CM | POA: Insufficient documentation

## 2014-12-14 DIAGNOSIS — Y9289 Other specified places as the place of occurrence of the external cause: Secondary | ICD-10-CM | POA: Insufficient documentation

## 2014-12-14 DIAGNOSIS — S40011A Contusion of right shoulder, initial encounter: Secondary | ICD-10-CM | POA: Insufficient documentation

## 2014-12-14 DIAGNOSIS — K219 Gastro-esophageal reflux disease without esophagitis: Secondary | ICD-10-CM | POA: Insufficient documentation

## 2014-12-14 DIAGNOSIS — Z7951 Long term (current) use of inhaled steroids: Secondary | ICD-10-CM | POA: Diagnosis not present

## 2014-12-14 DIAGNOSIS — G43909 Migraine, unspecified, not intractable, without status migrainosus: Secondary | ICD-10-CM | POA: Insufficient documentation

## 2014-12-14 DIAGNOSIS — G8929 Other chronic pain: Secondary | ICD-10-CM | POA: Insufficient documentation

## 2014-12-14 DIAGNOSIS — E039 Hypothyroidism, unspecified: Secondary | ICD-10-CM | POA: Insufficient documentation

## 2014-12-14 DIAGNOSIS — Y9389 Activity, other specified: Secondary | ICD-10-CM | POA: Insufficient documentation

## 2014-12-14 DIAGNOSIS — Z8701 Personal history of pneumonia (recurrent): Secondary | ICD-10-CM | POA: Insufficient documentation

## 2014-12-14 DIAGNOSIS — Z8673 Personal history of transient ischemic attack (TIA), and cerebral infarction without residual deficits: Secondary | ICD-10-CM | POA: Diagnosis not present

## 2014-12-14 DIAGNOSIS — G35 Multiple sclerosis: Secondary | ICD-10-CM | POA: Insufficient documentation

## 2014-12-14 DIAGNOSIS — Y998 Other external cause status: Secondary | ICD-10-CM | POA: Insufficient documentation

## 2014-12-14 DIAGNOSIS — Z8672 Personal history of thrombophlebitis: Secondary | ICD-10-CM | POA: Diagnosis not present

## 2014-12-14 DIAGNOSIS — J449 Chronic obstructive pulmonary disease, unspecified: Secondary | ICD-10-CM | POA: Diagnosis not present

## 2014-12-14 DIAGNOSIS — G4730 Sleep apnea, unspecified: Secondary | ICD-10-CM | POA: Diagnosis not present

## 2014-12-14 DIAGNOSIS — W19XXXA Unspecified fall, initial encounter: Secondary | ICD-10-CM

## 2014-12-14 DIAGNOSIS — W010XXA Fall on same level from slipping, tripping and stumbling without subsequent striking against object, initial encounter: Secondary | ICD-10-CM | POA: Insufficient documentation

## 2014-12-14 DIAGNOSIS — M109 Gout, unspecified: Secondary | ICD-10-CM | POA: Insufficient documentation

## 2014-12-14 MED ORDER — IBUPROFEN 800 MG PO TABS
800.0000 mg | ORAL_TABLET | Freq: Once | ORAL | Status: AC
  Administered 2014-12-14: 800 mg via ORAL
  Filled 2014-12-14: qty 1

## 2014-12-14 MED ORDER — TRAMADOL HCL 50 MG PO TABS: 50.0000 mg | ORAL_TABLET | Freq: Four times a day (QID) | ORAL | Status: DC | PRN

## 2014-12-14 NOTE — ED Notes (Signed)
MD at bedside. 

## 2014-12-14 NOTE — ED Provider Notes (Signed)
[name]: [id number]     Arrival date & time 12/14/14  1028 History   None    This chart was scribed for Nichole Lyons, MD by [name], ED Scribe. This patient was seen in room APA12/[facility]12 and the patient's care was started 10:54 AM.   Chief Complaint  Patient presents with  . Fall   Patient is a 55 y.o. female presenting with fall. The history is provided by the patient. No language interpreter was used.  Fall This is a new problem. The current episode started yesterday. The problem occurs constantly. The problem has not changed since onset.Nothing aggravates the symptoms. Nothing relieves the symptoms. She has tried nothing for the symptoms. The treatment provided no relief.    HPI Comments: [name] is a 54 y.o. female with a PMHx of DDD, COPD, GERD, CHF, hyperlipidemia, DM, TIA, and stroke who presents to the Emergency Department here after a fall sustained last night. Pt states she slipped on a mud puddle results in her landing on her R side. She states she was unable to ambulate after fall and was carried into her house. She is able to now bear a small amount of weight on her L side. [name] now c/o constant, moderate lower back pain, R hip pain, R shoulder pain, and R knee pain. No other associated symptoms at this time. Pt with several known allergies to medications as listed below.  Past Medical History  Diagnosis Date  . Gastritis   . Fibromyalgia   . Edema of lower extremity   . DDD (degenerative disc disease)   . MS (multiple sclerosis)     early stages  . Neuropathy   . COPD (chronic obstructive pulmonary disease)   . Hiatal hernia   . Gout   . TIA (transient ischemic attack)     Hx: of  . Asthma     Albuterol prn   . Depression     takes Celexa,Clonazepam daily  . Muscle spasm     takes Flexeril daily as needed  . GERD (gastroesophageal reflux disease)     takes Pepcid daily  . CHF (congestive heart failure)     takes Furosemide daily  . Hypothyroidism      takes Synthroid daily  . HTN (hypertension)     takes lisinopril daily  . Complication of anesthesia     hard to wake up  . PONV (postoperative nausea and vomiting)   . Sleep apnea     sleep study done at least 70yrs ago;has CPAP but doesn't use  . Hyperlipidemia     but doesn't take any meds  . Phlebitis   . Emphysema lung   . Shortness of breath     with exertion or stressed   . Pneumonia     hx of;last time in 2014  . History of bronchitis     last time 4-24months ago   . Migraine     has one now  . Migraine   . Stroke     left sided weakness  . Joint pain   . Joint swelling   . Chronic back pain     DDD/scoliosis  . Osteoporosis   . Hemorrhoids   . History of colon polyps   . Urinary frequency   . History of kidney stones     is in her left kidney and has been for couple of yrs and no problems  . Diabetes mellitus     borderline  .  Insomnia     doesn't take any meds for this  . Gout of big toe     RIGHT   Past Surgical History  Procedure Laterality Date  . Fundic gland polyp      benign  . Sigmoidoscopy  01/31/08    large internal hemorrhoids/small rectal polyp removed/rare sigmoid diverticula  . Esophagogastroduodenoscopy  10/29/07    normal  . Cholecystectomy    . Bladder suspension    . Abdominal hysterectomy    . Carpal tunnel release Right   . Cardiac catheterization    . Colonoscopy    . Anterior cervical decomp/discectomy fusion N/A 12/30/2013    Procedure: ANTERIOR CERVICAL DISCECTOMY FUSION C4-5, C5-6, plate and screws, allograft, local bone graft;  Surgeon: Nichole Champagne, MD;  Location: [facility] OR;  Service: Orthopedics;  Laterality: N/A;   Family History  Problem Relation Age of Onset  . Diabetes Mother   . Hypertension Mother   . Arthritis Father   . Diabetes Sister   . Neuropathy Sister   . Transient ischemic attack Sister   . Heart disease Sister   . Hypertension Sister   . Cancer - Lung Other   . Cancer - Prostate Other   . Cancer - Other  Other    History  Substance Use Topics  . Smoking status: Current Some Day Smoker -- 0.50 packs/day for 15 years    Types: Cigarettes  . Smokeless tobacco: Never Used  . Alcohol Use: No   OB History    No data available     Review of Systems  Musculoskeletal: Positive for arthralgias. Negative for joint swelling.  All other systems reviewed and are negative.     Allergies  Sulfa antibiotics; Demerol; Penicillins; Levaquin; Neurontin; Toradol; and Doxycycline  Home Medications   Prior to Admission medications   Medication Sig Start Date End Date Taking? Authorizing Provider  albuterol (PROVENTIL HFA;VENTOLIN HFA) 108 (90 BASE) MCG/ACT inhaler Inhale 2 puffs into the lungs every 6 (six) hours as needed. Shortness of breath.    Historical Provider, MD  ALPRAZolam Prudy Feeler) 1 MG tablet Take 1 mg by mouth 3 (three) times daily as needed for anxiety.    Historical Provider, MD  ciprofloxacin (CIPRO) 500 MG tablet Take 1 tablet (500 mg total) by mouth 2 (two) times daily. 11/20/14   Nichole Blinks, MD  citalopram (CELEXA) 40 MG tablet Take 40 mg by mouth daily.    Historical Provider, MD  cyclobenzaprine (FLEXERIL) 10 MG tablet Take 10 mg by mouth 3 (three) times daily as needed for muscle spasms.    Historical Provider, MD  famotidine (PEPCID) 20 MG tablet Take 1 tablet (20 mg total) by mouth daily as needed for heartburn. 11/20/14   Nichole Blinks, MD  Fluticasone Furoate-Vilanterol (BREO ELLIPTA) 100-25 MCG/INH AEPB Inhale 1 puff into the lungs daily.    Historical Provider, MD  furosemide (LASIX) 20 MG tablet Take 20 mg by mouth daily.    Historical Provider, MD  HYDROcodone-acetaminophen (NORCO) 10-325 MG per tablet Take 1 tablet by mouth every 4 (four) hours as needed for moderate pain or severe pain. 11/20/14   Nichole Blinks, MD  hydrocortisone (ANUSOL-HC) 2.5 % rectal cream Place 1 application rectally 2 (two) times daily. 12/09/14   Nichole Retort, NP  levothyroxine (SYNTHROID,  LEVOTHROID) 88 MCG tablet Take 88 mcg by mouth daily before breakfast.    Historical Provider, MD  lisinopril (PRINIVIL,ZESTRIL) 40 MG tablet Take 40 mg by mouth daily.  Historical Provider, MD  metroNIDAZOLE (FLAGYL) 500 MG tablet Take 1 tablet (500 mg total) by mouth 3 (three) times daily. 11/20/14   Nichole Blinks, MD  ondansetron (ZOFRAN) 4 MG tablet Take 1 tablet (4 mg total) by mouth every 6 (six) hours as needed for nausea. 11/20/14   Nichole Blinks, MD  pantoprazole (PROTONIX) 40 MG tablet Take 1 tablet (40 mg total) by mouth daily. 11/20/14   Nichole Blinks, MD   Triage Vitals: BP 146/101 mmHg  Pulse 100  Temp(Src) 98.1 F (36.7 C) (Oral)  Resp 22  Ht  (1.651 m)  Wt 125 lb (56.7 kg)  BMI 20.80 kg/m2  SpO2 100%   Physical Exam  Constitutional: She is oriented to person, place, and time. She appears well-developed and well-nourished.  HENT:  Head: Normocephalic.  Eyes: EOM are normal.  Neck: Normal range of motion.  Pulmonary/Chest: Effort normal.  Abdominal: She exhibits no distension.  Musculoskeletal: Normal range of motion. She exhibits tenderness.  There is Tenderness to palpation of the R shoulder, R hip, R knee. All of these areas are without swelling or deformity. All extremities are PMS intact.  Neurological: She is alert and oriented to person, place, and time.  Psychiatric: She has a normal mood and affect.  Nursing note and vitals reviewed.   ED Course  Procedures (including critical care time)  DIAGNOSTIC STUDIES: Oxygen Saturation is 100% on RA, Normal by my interpretation.    COORDINATION OF CARE: 10:58 AM- Will order X-Rays of knee, lumbar spine, cervical spine, hip, and shoulder. Discussed treatment plan with pt at bedside and pt agreed to plan.     Labs Review Labs Reviewed - No data to display  Imaging Review Dg Cervical Spine Complete  12/14/2014   CLINICAL DATA:  Initial encounter for fall last night. Right shoulder pain and neck pain.   EXAM: CERVICAL SPINE  4+ VIEWS  COMPARISON:  Intraoperative study of 12/30/2013.  FINDINGS: The lateral view images through the bottom of C6. Prevertebral soft tissues are within normal limits. Anterior plate and screw fixation at C4-6. No acute hardware complication. Lateral masses and odontoid process partially obscured. No gross abnormality identified. C7 vertebral body height maintained on swimmer's view. Partially obscured. Cervical thoracic junction not well evaluated.  IMPRESSION: C4-6 anterior fixation, without acute hardware complication.  Suboptimal evaluation of C1-2 and C7-T1.   Electronically Signed   By: Jeronimo Greaves M.D.   On: 12/14/2014 12:38   Dg Lumbar Spine Complete  12/14/2014   CLINICAL DATA:  Post fall last night, now with low back pain.  EXAM: LUMBAR SPINE - COMPLETE 4+ VIEW  COMPARISON:  CT abdomen pelvis - 11/15/2014  FINDINGS: There are 4 non rib-bearing lumbar type vertebral bodies with diminutive ribs seen bilaterally at L1. For the purposes of this dictation, the vertebral body containing the bilateral diminutive ribs will be labeled L1.  Post L5-S1 paraspinal fusion and intervertebral disc space replacement. No evidence of hardware failure or loosening. There is unchanged mild (approximately 10 mm) of anterolisthesis of L5 upon S1, grossly unchanged.  Otherwise, normal alignment the lumbar spine. No definite pars defects  Lumbar vertebral body heights and intervertebral disc spaces are preserved.  Limited visualization the bilateral SI joints is normal.  Moderate colonic stool burden without evidence of obstruction. Post cholecystectomy. Atherosclerotic plaque within the abdominal aorta.  IMPRESSION: 1. No acute findings. 2. Post L5-S1 paraspinal fusion and intervertebral disc space replacement without evidence of hardware failure or loosening.  Electronically Signed   By: Simonne Come M.D.   On: 12/14/2014 12:40   Dg Shoulder Right  12/14/2014   CLINICAL DATA:  Patient slipped  outside in a puddle, landing on her right side, unable to ambulate after the fall. Right shoulder pain. Initial encounter.  EXAM: RIGHT SHOULDER - 2+ VIEW  COMPARISON:  None.  FINDINGS: No evidence of acute fracture or glenohumeral dislocation. Subacromial space well preserved. Acromioclavicular joint intact with minimal degenerative change. Bone mineral density well-preserved.  IMPRESSION: No acute or significant abnormality.   Electronically Signed   By: Hulan Saas M.D.   On: 12/14/2014 12:31   Dg Knee Complete 4 Views Right  12/14/2014   CLINICAL DATA:  Patient slipped on a puddle outside yesterday, injuring the right knee. Initial encounter.  EXAM: RIGHT KNEE - COMPLETE 4+ VIEW  COMPARISON:  None.  FINDINGS: No evidence of acute fracture or dislocation. Mild medial and patellofemoral compartment joint space narrowing. Lateral compartment joint space well preserved. Bone mineral density well-preserved. Enthesopathic spurring at the insertion of the quadriceps tendon on the superior patella and the patellar tendon on the inferior patella. No visible joint effusion.  IMPRESSION: 1. No acute osseous abnormality. 2. Mild medial and patellofemoral compartment osteoarthritis.   Electronically Signed   By: Hulan Saas M.D.   On: 12/14/2014 13:09   Dg Hip Unilat With Pelvis 1v Right  12/14/2014   CLINICAL DATA:  Fall last night with lumbar pain, cervical and shoulder pain.  EXAM: RIGHT HIP (WITH PELVIS) 1 VIEW  COMPARISON:  None.  FINDINGS: Exam demonstrates minimal symmetric degenerative change of the hips. There is no acute fracture or dislocation. Fusion hardware is intact over the lumbosacral junction. Mild gym change of the spine and sacroiliac joints.  IMPRESSION: No acute findings.   Electronically Signed   By: Elberta Fortis M.D.   On: 12/14/2014 12:32     EKG Interpretation None      MDM   Final diagnoses:  None    Patient is a 54 year old female who presents with complaints of  multiple injuries that occurred while getting out of the front. She apparently slipped and fell awkwardly to the ground. She is complaining of pain in her right knee, right hip, low back, right shoulder, and neck. X-rays of all of these areas reveal no evidence for fracture. She will be discharged to home with ibuprofen and tramadol.  I personally performed the services described in this documentation, which was scribed in my presence. The recorded information has been reviewed and is accurate.      Nichole Lyons, MD 12/14/14 1328

## 2014-12-14 NOTE — ED Notes (Signed)
Pt reports slipped in a mud puddle last night.  C/O pain in lower back, neck,  hip, r knee, and r shoulder.

## 2014-12-14 NOTE — Discharge Instructions (Signed)
Ibuprofen 600 mg every 6 hours as needed for pain.  Tramadol as needed for pain not relieved with ibuprofen.  Follow-up with your primary Dr. if not improving in the next week.   Contusion A contusion is a deep bruise. Contusions are the result of an injury that caused bleeding under the skin. The contusion may turn blue, purple, or yellow. Minor injuries will give you a painless contusion, but more severe contusions may stay painful and swollen for a few weeks.  CAUSES  A contusion is usually caused by a blow, trauma, or direct force to an area of the body. SYMPTOMS   Swelling and redness of the injured area.  Bruising of the injured area.  Tenderness and soreness of the injured area.  Pain. DIAGNOSIS  The diagnosis can be made by taking a history and physical exam. An X-ray, CT scan, or MRI may be needed to determine if there were any associated injuries, such as fractures. TREATMENT  Specific treatment will depend on what area of the body was injured. In general, the best treatment for a contusion is resting, icing, elevating, and applying cold compresses to the injured area. Over-the-counter medicines may also be recommended for pain control. Ask your caregiver what the best treatment is for your contusion. HOME CARE INSTRUCTIONS   Put ice on the injured area.  Put ice in a plastic bag.  Place a towel between your skin and the bag.  Leave the ice on for 15-20 minutes, 3-4 times a day, or as directed by your health care provider.  Only take over-the-counter or prescription medicines for pain, discomfort, or fever as directed by your caregiver. Your caregiver may recommend avoiding anti-inflammatory medicines (aspirin, ibuprofen, and naproxen) for 48 hours because these medicines may increase bruising.  Rest the injured area.  If possible, elevate the injured area to reduce swelling. SEEK IMMEDIATE MEDICAL CARE IF:   You have increased bruising or swelling.  You have pain  that is getting worse.  Your swelling or pain is not relieved with medicines. MAKE SURE YOU:   Understand these instructions.  Will watch your condition.  Will get help right away if you are not doing well or get worse. Document Released: 08/03/2005 Document Revised: 10/29/2013 Document Reviewed: 08/29/2011 [facility] Patient Information 2015 [place], [place]. This information is not intended to replace advice given to you by your health care provider. Make sure you discuss any questions you have with your health care provider.

## 2014-12-16 ENCOUNTER — Ambulatory Visit (HOSPITAL_COMMUNITY): Payer: Self-pay | Admitting: Psychiatry

## 2014-12-22 ENCOUNTER — Ambulatory Visit: Payer: 59 | Admitting: Nurse Practitioner

## 2014-12-23 ENCOUNTER — Encounter: Payer: Self-pay | Admitting: Nurse Practitioner

## 2015-01-08 ENCOUNTER — Ambulatory Visit (INDEPENDENT_AMBULATORY_CARE_PROVIDER_SITE_OTHER): Payer: 59 | Admitting: Nurse Practitioner

## 2015-01-08 ENCOUNTER — Other Ambulatory Visit: Payer: Self-pay

## 2015-01-08 ENCOUNTER — Encounter: Payer: Self-pay | Admitting: Nurse Practitioner

## 2015-01-08 VITALS — BP 145/84 | HR 89 | Temp 97.0°F | Ht 65.0 in | Wt 119.8 lb

## 2015-01-08 DIAGNOSIS — K529 Noninfective gastroenteritis and colitis, unspecified: Secondary | ICD-10-CM

## 2015-01-08 DIAGNOSIS — K625 Hemorrhage of anus and rectum: Secondary | ICD-10-CM

## 2015-01-08 MED ORDER — PEG 3350-KCL-NA BICARB-NACL 420 G PO SOLR: 4000.0000 mL | ORAL | Status: DC

## 2015-01-08 NOTE — Patient Instructions (Signed)
1. We will schedule your colonoscopy for you 2. Follow the bowel prep instructions carefully 3. Return for evaluation in 6-8 weeks 4. Further recommendations to be made based on the results of your procedure.

## 2015-01-08 NOTE — Assessment & Plan Note (Signed)
Acute colitis on CT during hospitalization for abdominal pain likely infectious vs ischemic. GI pathogen panel negative. Patient states her abdominal pain has been 9/10 constant since her hospitalization and is requeting pain medications. States she's had continuing rectal bleeding as well. Subjective reports of weight loss. Will order CBC to check H/H, CMP for kidney and liver function, TTG for celiac as well as ESR/CRP for inflammatory process. Will also schedule her for a colonoscopy per hospital recommendations for rectal bleeding. Return in 6-8 weeks for follow-up and re-evaluation post procedure and for symptoms.  Proceed with colonoscopy with Dr. Oneida Alar in the near future in the OR with propofol due to polypharmacy. The risks, benefits, and alternatives have been discussed in detail with the patient. They state understanding and desire to proceed.

## 2015-01-08 NOTE — Progress Notes (Signed)
Referring Provider: [name], MD Primary Care Physician:  [name], MD Primary GI:  Dr. Darrick Penna  Chief Complaint  Patient presents with  . Other    eval for TCS    HPI:   54 year old female for hospital follow-up and setup of colonoscopy. Patient admitted to the hospital 11/15/14 for colitis and rectal bleeding. During her hospital course she was passing brbpr with clots. hgb was mildly decreased but stable throughout her hospital stay. CT showed colitis, likely etiologies infectious vs ischemic. Stool pathogen panel negative. Sigmodioscopy report reviewed, done in 2009 with poor prep, large internal hemorrhoids, and small rectal polyp removed, no path report available, will request it. Recommended 5 year colonoscopy with 2 day prep. EGD done 10/29/2007 with normal esophagus, occasional erosions, negative H. Pylori on biopsy, normal duodenum. Bravo capsule placed, no report available, will request.   Today states bleeding resumed 3 weeks ago and will bleed rectally for a couple days then let up. States she's not having bleeding this morning. Also c/o abdominal pain which is described as sharp and crampy and is 9/10 on the pain scale constantly. Abdominal pain has been constant since hospitalization 2 months ago and is no better, actually may be worsening. Also c/o diarrhea with eating. States she's lost about 45 pounds in 2 months. Per PCP, TSH was very low and medication adjustments were made to her Synthroid. States she's been told to take Tylenol and Ibuprofen for her pain, states she cannot take the Ultram she received from the ED after her fall last month cause it "eats up her hiatal hernia" and so she did not even get it filled. Was also given bentyl and it hasn't helped. Objective weight data inconsistent with subjective reports: Per ED notes in janurary patient weight 104, ED visit in February was 125lb and today she is 119lb. Is requesting pain medication for her abdominal pain. Denies  any other upper or lower GI symptoms.   Past Medical History  Diagnosis Date  . Gastritis   . Fibromyalgia   . Edema of lower extremity   . DDD (degenerative disc disease)   . MS (multiple sclerosis)     early stages  . Neuropathy   . COPD (chronic obstructive pulmonary disease)   . Hiatal hernia   . Gout   . TIA (transient ischemic attack)     Hx: of  . Asthma     Albuterol prn   . Depression     takes Celexa,Clonazepam daily  . Muscle spasm     takes Flexeril daily as needed  . GERD (gastroesophageal reflux disease)     takes Pepcid daily  . CHF (congestive heart failure)     takes Furosemide daily  . Hypothyroidism     takes Synthroid daily  . HTN (hypertension)     takes lisinopril daily  . Complication of anesthesia     hard to wake up  . PONV (postoperative nausea and vomiting)   . Sleep apnea     sleep study done at least 87yrs ago;has CPAP but doesn't use  . Hyperlipidemia     but doesn't take any meds  . Phlebitis   . Emphysema lung   . Shortness of breath     with exertion or stressed   . Pneumonia     hx of;last time in 2014  . History of bronchitis     last time 4-35months ago   . Migraine     has  one now  . Migraine   . Stroke     left sided weakness  . Joint pain   . Joint swelling   . Chronic back pain     DDD/scoliosis  . Osteoporosis   . Hemorrhoids   . History of colon polyps   . Urinary frequency   . History of kidney stones     is in her left kidney and has been for couple of yrs and no problems  . Diabetes mellitus     borderline  . Insomnia     doesn't take any meds for this  . Gout of big toe     RIGHT    Past Surgical History  Procedure Laterality Date  . Fundic gland polyp      benign  . Sigmoidoscopy  01/31/08    large internal hemorrhoids/small rectal polyp removed/rare sigmoid diverticula  . Esophagogastroduodenoscopy  10/29/07    normal  . Cholecystectomy    . Bladder suspension    . Abdominal hysterectomy    .  Carpal tunnel release Right   . Cardiac catheterization    . Colonoscopy    . Anterior cervical decomp/discectomy fusion N/A 12/30/2013    Procedure: ANTERIOR CERVICAL DISCECTOMY FUSION C4-5, C5-6, plate and screws, allograft, local bone graft;  Surgeon: Kerrin Champagne, MD;  Location: [facility] OR;  Service: Orthopedics;  Laterality: N/A;    Current Outpatient Prescriptions  Medication Sig Dispense Refill  . acetaminophen (TYLENOL) 500 MG tablet Take 1,000 mg by mouth every 4 (four) hours as needed for moderate pain.    Marland Kitchen albuterol (PROVENTIL HFA;VENTOLIN HFA) 108 (90 BASE) MCG/ACT inhaler Inhale 2 puffs into the lungs every 6 (six) hours as needed. Shortness of breath.    . ALPRAZolam (XANAX) 1 MG tablet Take 1 mg by mouth 3 (three) times daily as needed for anxiety.    . citalopram (CELEXA) 40 MG tablet Take 40 mg by mouth daily.    . cyclobenzaprine (FLEXERIL) 10 MG tablet Take 10 mg by mouth 3 (three) times daily as needed for muscle spasms.    . famotidine (PEPCID) 20 MG tablet Take 1 tablet (20 mg total) by mouth daily as needed for heartburn.    . Fluticasone Furoate-Vilanterol (BREO ELLIPTA) 100-25 MCG/INH AEPB Inhale 1 puff into the lungs daily.    . furosemide (LASIX) 20 MG tablet Take 20 mg by mouth daily.    Marland Kitchen HYDROcodone-acetaminophen (NORCO) 10-325 MG per tablet Take 1 tablet by mouth every 4 (four) hours as needed for moderate pain or severe pain. 40 tablet 0  . hydrocortisone (ANUSOL-HC) 2.5 % rectal cream Place 1 application rectally 2 (two) times daily. 30 g 1  . levothyroxine (SYNTHROID, LEVOTHROID) 88 MCG tablet Take 88 mcg by mouth daily before breakfast.    . lisinopril (PRINIVIL,ZESTRIL) 40 MG tablet Take 40 mg by mouth daily.    . metroNIDAZOLE (FLAGYL) 500 MG tablet Take 1 tablet (500 mg total) by mouth 3 (three) times daily. 15 tablet 0  . ondansetron (ZOFRAN) 4 MG tablet Take 1 tablet (4 mg total) by mouth every 6 (six) hours as needed for nausea. 20 tablet 0  . pantoprazole  (PROTONIX) 40 MG tablet Take 1 tablet (40 mg total) by mouth daily. 30 tablet 1  . ciprofloxacin (CIPRO) 500 MG tablet Take 1 tablet (500 mg total) by mouth 2 (two) times daily. (Patient not taking: Reported on 01/08/2015) 10 tablet 0  . traMADol (ULTRAM) 50 MG tablet Take 1 tablet (  50 mg total) by mouth every 6 (six) hours as needed. (Patient not taking: Reported on 01/08/2015) 15 tablet 0   No current facility-administered medications for this visit.    Allergies as of 01/08/2015 - Review Complete 01/08/2015  Allergen Reaction Noted  . Sulfa antibiotics Anaphylaxis 08/06/2012  . Demerol [meperidine] Swelling 08/06/2012  . Penicillins Swelling 08/06/2012  . Levaquin [levofloxacin in d5w]  04/10/2014  . Neurontin [gabapentin] Other (See Comments) 11/04/2013  . Toradol [ketorolac tromethamine] Other (See Comments) 11/15/2014  . Tramadol Other (See Comments) 01/08/2015  . Doxycycline Nausea And Vomiting 09/21/2012    Family History  Problem Relation Age of Onset  . Diabetes Mother   . Hypertension Mother   . Arthritis Father   . Diabetes Sister   . Neuropathy Sister   . Transient ischemic attack Sister   . Heart disease Sister   . Hypertension Sister   . Cancer - Lung Other   . Cancer - Prostate Other   . Cancer - Other Other     History   Social History  . Marital Status: Married    Spouse Name: N/A  . Number of Children: N/A  . Years of Education: N/A   Social History Main Topics  . Smoking status: Current Some Day Smoker -- 0.50 packs/day for 15 years    Types: Cigarettes  . Smokeless tobacco: Never Used  . Alcohol Use: No  . Drug Use: No  . Sexual Activity: Yes    Birth Control/ Protection: Surgical   Other Topics Concern  . None   Social History Narrative    Review of Systems: Gen: Denies fever, chills, anorexia. Denies fatigue, weakness, weight loss.  CV: Denies chest pain, palpitations, syncope, peripheral edema, and claudication. Resp: Denies dyspnea at  rest, cough, wheezing, coughing up blood, and pleurisy. GI: Denies vomiting blood, jaundice, and fecal incontinence.   Denies dysphagia or odynophagia. Derm: Denies rash, itching, dry skin Psych: Denies depression, anxiety, memory loss, confusion. No homicidal or suicidal ideation.  Heme: Denies bruising, bleeding, and enlarged lymph nodes.  Physical Exam: BP 145/84 mmHg  Pulse 89  Temp(Src) 97 F (36.1 C)  Ht  (1.651 m)  Wt 119 lb 12.8 oz (54.341 kg)  BMI 19.94 kg/m2 General:   Alert and oriented. No distress noted. Pleasant and cooperative.  Head:  Normocephalic and atraumatic. Eyes:  Conjuctiva clear without scleral icterus. Mouth:  Oral mucosa pink and moist. No lesions. No OP edema. Neck:  Supple, without mass or thyromegaly. Lungs:  Clear to auscultation bilaterally. No wheezes, rales, or rhonchi. No distress.  Heart:  S1, S2 present without murmurs, rubs, or gallops. Regular rate and rhythm. Abdomen:  +BS, soft, and non-distended. Reports severe tenderness generalized abdomen with only skin contact as well as light palpation. No rebound or guarding. No HSM or masses noted. Msk:  Symmetrical without gross deformities. Normal posture. Extremities:  Without edema. Neurologic:  Alert and  oriented x4;  grossly normal neurologically. Skin:  Intact without significant lesions or rashes. Cervical Nodes:  No significant cervical adenopathy. Psych:  Alert and cooperative. Normal mood and affect.    01/08/2015 10:08 AM

## 2015-01-08 NOTE — Assessment & Plan Note (Signed)
Recent hospitalization for rectal bleeding descried as profuse with clots per patient. During hospitalization reports of persistent heavy rectal bleeding and abdominal pain, Hgb remained stable around 11.7 for the duration of the inpatient stay. CT indicative of colitis likely infectious vs ischemic. GI pathogen panel negative, recommendations for outpatient colonoscopy. Will check CBC for H/H and proceed with TCS. If colonoscopy normal can consider imaging to evaluate for mesenteric ischemia. Patient requesting pain medications as she's allergic to Tylenol, NSAIDs, Ultram, and Bentyl doesn't work. Return for follow-up in 6-8 weeks. Records from previous path report in 2009 as well as Bravo Capsule report have been requested for consideration.  Proceed with colonoscopy with Dr. Darrick Penna in the near future in the OR with propofol due to polypharmacy. The risks, benefits, and alternatives have been discussed in detail with the patient. They state understanding and desire to proceed.

## 2015-01-08 NOTE — Progress Notes (Signed)
cc'ed to pcp °

## 2015-01-13 ENCOUNTER — Telehealth: Payer: Self-pay

## 2015-01-13 NOTE — Telephone Encounter (Signed)
Jasmine from the pre-service center called and stated that pt needs a pre cert for her colonoscopy.   Called [facility] and obtained approval.   PA # [id number]  [facility] aware.

## 2015-01-13 NOTE — Patient Instructions (Signed)
JOURDYN HASLER  01/13/2015   Your procedure is scheduled on:  01/20/2015  Report to [facility] at  700  AM.  Call this number if you have problems the morning of surgery: [phone]   Remember:   Do not eat food or drink liquids after midnight.   Take these medicines the morning of surgery with A SIP OF WATER:  Xanax, celexa, flexaril, pepcid, hydrocodone, levothyroxine, lisinopril, zofran. Take your albuterol before you come.   Do not wear jewelry, make-up or nail polish.  Do not wear lotions, powders, or perfumes.   Do not shave 48 hours prior to surgery. Men may shave face and neck.  Do not bring valuables to the hospital.  [facility] is not responsible for any belongings or valuables.               Contacts, dentures or bridgework may not be worn into surgery.  Leave suitcase in the car. After surgery it may be brought to your room.  For patients admitted to the hospital, discharge time is determined by your treatment team.               Patients discharged the day of surgery will not be allowed to drive home.  Name and phone number of your driver: family  Special Instructions: N/A   Please read over the following fact sheets that you were given: Pain Booklet, Coughing and Deep Breathing, Surgical Site Infection Prevention, Anesthesia Post-op Instructions and Care and Recovery After Surgery Colonoscopy A colonoscopy is an exam to look at the entire large intestine (colon). This exam can help find problems such as tumors, polyps, inflammation, and areas of bleeding. The exam takes about 1 hour.  LET [facility] CARE PROVIDER KNOW ABOUT:   Any allergies you have.  All medicines you are taking, including vitamins, herbs, eye drops, creams, and over-the-counter medicines.  Previous problems you or members of your family have had with the use of anesthetics.  Any blood disorders you have.  Previous surgeries you have had.  Medical conditions you have. RISKS AND  COMPLICATIONS  Generally, this is a safe procedure. However, as with any procedure, complications can occur. Possible complications include:  Bleeding.  Tearing or rupture of the colon wall.  Reaction to medicines given during the exam.  Infection (rare). BEFORE THE PROCEDURE   Ask your health care provider about changing or stopping your regular medicines.  You may be prescribed an oral bowel prep. This involves drinking a large amount of medicated liquid, starting the day before your procedure. The liquid will cause you to have multiple loose stools until your stool is almost clear or light green. This cleans out your colon in preparation for the procedure.  Do not eat or drink anything else once you have started the bowel prep, unless your health care provider tells you it is safe to do so.  Arrange for someone to drive you home after the procedure. PROCEDURE   You will be given medicine to help you relax (sedative).  You will lie on your side with your knees bent.  A long, flexible tube with a light and camera on the end (colonoscope) will be inserted through the rectum and into the colon. The camera sends video back to a computer screen as it moves through the colon. The colonoscope also releases carbon dioxide gas to inflate the colon. This helps your health care provider see the area better.  During the exam,  your health care provider may take a small tissue sample (biopsy) to be examined under a microscope if any abnormalities are found.  The exam is finished when the entire colon has been viewed. AFTER THE PROCEDURE   Do not drive for 24 hours after the exam.  You may have a small amount of blood in your stool.  You may pass moderate amounts of gas and have mild abdominal cramping or bloating. This is caused by the gas used to inflate your colon during the exam.  Ask when your test results will be ready and how you will get your results. Make sure you get your test  results. Document Released: 10/21/2000 Document Revised: 08/14/2013 Document Reviewed: 07/01/2013 [facility] Patient Information 2015 [place], [place]. This information is not intended to replace advice given to you by your health care provider. Make sure you discuss any questions you have with your health care provider. PATIENT INSTRUCTIONS POST-ANESTHESIA  IMMEDIATELY FOLLOWING SURGERY:  Do not drive or operate machinery for the first twenty four hours after surgery.  Do not make any important decisions for twenty four hours after surgery or while taking narcotic pain medications or sedatives.  If you develop intractable nausea and vomiting or a severe headache please notify your doctor immediately.  FOLLOW-UP:  Please make an appointment with your surgeon as instructed. You do not need to follow up with anesthesia unless specifically instructed to do so.  WOUND CARE INSTRUCTIONS (if applicable):  Keep a dry clean dressing on the anesthesia/puncture wound site if there is drainage.  Once the wound has quit draining you may leave it open to air.  Generally you should leave the bandage intact for twenty four hours unless there is drainage.  If the epidural site drains for more than 36-48 hours please call the anesthesia department.  QUESTIONS?:  Please feel free to call your physician or the hospital operator if you have any questions, and they will be happy to assist you.

## 2015-01-14 ENCOUNTER — Encounter (HOSPITAL_COMMUNITY): Payer: Self-pay

## 2015-01-14 ENCOUNTER — Encounter (HOSPITAL_COMMUNITY)
Admission: RE | Admit: 2015-01-14 | Discharge: 2015-01-14 | Disposition: A | Discharge status: Home / Self Care | Payer: 59 | Source: Ambulatory Visit | Attending: Gastroenterology | Admitting: Gastroenterology

## 2015-01-14 ENCOUNTER — Other Ambulatory Visit: Payer: Self-pay

## 2015-01-14 DIAGNOSIS — Z01812 Encounter for preprocedural laboratory examination: Secondary | ICD-10-CM | POA: Diagnosis not present

## 2015-01-14 DIAGNOSIS — Z01810 Encounter for preprocedural cardiovascular examination: Secondary | ICD-10-CM | POA: Insufficient documentation

## 2015-01-14 DIAGNOSIS — K625 Hemorrhage of anus and rectum: Secondary | ICD-10-CM | POA: Diagnosis not present

## 2015-01-14 HISTORY — DX: Anxiety disorder, unspecified: F41.9

## 2015-01-14 LAB — CBC
HCT: 40.2 % (ref 36.0–46.0)
Hemoglobin: 13.2 g/dL (ref 12.0–15.0)
MCH: 30.3 pg (ref 26.0–34.0)
MCHC: 32.8 g/dL (ref 30.0–36.0)
MCV: 92.2 fL (ref 78.0–100.0)
Platelets: 218 K/uL (ref 150–400)
RBC: 4.36 MIL/uL (ref 3.87–5.11)
RDW: 13.8 % (ref 11.5–15.5)
WBC: 5.8 K/uL (ref 4.0–10.5)

## 2015-01-14 LAB — BASIC METABOLIC PANEL WITH GFR
Anion gap: 6 (ref 5–15)
BUN: 8 mg/dL (ref 6–23)
CO2: 31 mmol/L (ref 19–32)
Calcium: 9.5 mg/dL (ref 8.4–10.5)
Chloride: 103 mmol/L (ref 96–112)
Creatinine, Ser: 0.88 mg/dL (ref 0.50–1.10)
GFR calc Af Amer: 84 mL/min — ABNORMAL LOW
GFR calc non Af Amer: 73 mL/min — ABNORMAL LOW
Glucose, Bld: 117 mg/dL — ABNORMAL HIGH (ref 70–99)
Potassium: 4 mmol/L (ref 3.5–5.1)
Sodium: 140 mmol/L (ref 135–145)

## 2015-01-14 NOTE — Pre-Procedure Instructions (Signed)
Patient given information to sign up for my chart at home. 

## 2015-01-20 ENCOUNTER — Encounter (HOSPITAL_COMMUNITY): Payer: Self-pay | Admitting: *Deleted

## 2015-01-20 ENCOUNTER — Encounter (HOSPITAL_COMMUNITY)
Admission: RE | Disposition: A | Discharge status: Home / Self Care | Payer: Self-pay | Source: Ambulatory Visit | Attending: Gastroenterology

## 2015-01-20 ENCOUNTER — Ambulatory Visit (HOSPITAL_COMMUNITY): Payer: 59 | Admitting: Anesthesiology

## 2015-01-20 ENCOUNTER — Ambulatory Visit (HOSPITAL_COMMUNITY)
Admission: RE | Admit: 2015-01-20 | Discharge: 2015-01-20 | Disposition: A | Discharge status: Home / Self Care | Payer: 59 | Source: Ambulatory Visit | Attending: Gastroenterology | Admitting: Gastroenterology

## 2015-01-20 DIAGNOSIS — G35 Multiple sclerosis: Secondary | ICD-10-CM | POA: Diagnosis not present

## 2015-01-20 DIAGNOSIS — Z87442 Personal history of urinary calculi: Secondary | ICD-10-CM | POA: Diagnosis not present

## 2015-01-20 DIAGNOSIS — K648 Other hemorrhoids: Secondary | ICD-10-CM | POA: Diagnosis not present

## 2015-01-20 DIAGNOSIS — Z882 Allergy status to sulfonamides status: Secondary | ICD-10-CM | POA: Insufficient documentation

## 2015-01-20 DIAGNOSIS — M797 Fibromyalgia: Secondary | ICD-10-CM | POA: Diagnosis not present

## 2015-01-20 DIAGNOSIS — M109 Gout, unspecified: Secondary | ICD-10-CM | POA: Insufficient documentation

## 2015-01-20 DIAGNOSIS — K621 Rectal polyp: Secondary | ICD-10-CM | POA: Diagnosis not present

## 2015-01-20 DIAGNOSIS — K219 Gastro-esophageal reflux disease without esophagitis: Secondary | ICD-10-CM | POA: Diagnosis not present

## 2015-01-20 DIAGNOSIS — R194 Change in bowel habit: Secondary | ICD-10-CM | POA: Diagnosis present

## 2015-01-20 DIAGNOSIS — J45909 Unspecified asthma, uncomplicated: Secondary | ICD-10-CM | POA: Diagnosis not present

## 2015-01-20 DIAGNOSIS — I509 Heart failure, unspecified: Secondary | ICD-10-CM | POA: Insufficient documentation

## 2015-01-20 DIAGNOSIS — E119 Type 2 diabetes mellitus without complications: Secondary | ICD-10-CM | POA: Diagnosis not present

## 2015-01-20 DIAGNOSIS — M810 Age-related osteoporosis without current pathological fracture: Secondary | ICD-10-CM | POA: Diagnosis not present

## 2015-01-20 DIAGNOSIS — E785 Hyperlipidemia, unspecified: Secondary | ICD-10-CM | POA: Diagnosis not present

## 2015-01-20 DIAGNOSIS — I10 Essential (primary) hypertension: Secondary | ICD-10-CM | POA: Diagnosis not present

## 2015-01-20 DIAGNOSIS — Z881 Allergy status to other antibiotic agents status: Secondary | ICD-10-CM | POA: Diagnosis not present

## 2015-01-20 DIAGNOSIS — K644 Residual hemorrhoidal skin tags: Secondary | ICD-10-CM | POA: Diagnosis not present

## 2015-01-20 DIAGNOSIS — Z880 Allergy status to penicillin: Secondary | ICD-10-CM | POA: Insufficient documentation

## 2015-01-20 DIAGNOSIS — Q438 Other specified congenital malformations of intestine: Secondary | ICD-10-CM | POA: Diagnosis not present

## 2015-01-20 DIAGNOSIS — J449 Chronic obstructive pulmonary disease, unspecified: Secondary | ICD-10-CM | POA: Diagnosis not present

## 2015-01-20 DIAGNOSIS — J439 Emphysema, unspecified: Secondary | ICD-10-CM | POA: Diagnosis not present

## 2015-01-20 DIAGNOSIS — F17210 Nicotine dependence, cigarettes, uncomplicated: Secondary | ICD-10-CM | POA: Diagnosis not present

## 2015-01-20 DIAGNOSIS — F329 Major depressive disorder, single episode, unspecified: Secondary | ICD-10-CM | POA: Insufficient documentation

## 2015-01-20 DIAGNOSIS — K449 Diaphragmatic hernia without obstruction or gangrene: Secondary | ICD-10-CM | POA: Insufficient documentation

## 2015-01-20 DIAGNOSIS — F419 Anxiety disorder, unspecified: Secondary | ICD-10-CM | POA: Insufficient documentation

## 2015-01-20 DIAGNOSIS — G4730 Sleep apnea, unspecified: Secondary | ICD-10-CM | POA: Diagnosis not present

## 2015-01-20 DIAGNOSIS — R109 Unspecified abdominal pain: Secondary | ICD-10-CM

## 2015-01-20 DIAGNOSIS — Z8673 Personal history of transient ischemic attack (TIA), and cerebral infarction without residual deficits: Secondary | ICD-10-CM | POA: Insufficient documentation

## 2015-01-20 DIAGNOSIS — Z886 Allergy status to analgesic agent status: Secondary | ICD-10-CM | POA: Insufficient documentation

## 2015-01-20 DIAGNOSIS — E039 Hypothyroidism, unspecified: Secondary | ICD-10-CM | POA: Diagnosis not present

## 2015-01-20 HISTORY — PX: BIOPSY: SHX5522

## 2015-01-20 HISTORY — PX: COLONOSCOPY WITH PROPOFOL: SHX5780

## 2015-01-20 HISTORY — PX: POLYPECTOMY: SHX5525

## 2015-01-20 LAB — GLUCOSE, CAPILLARY
GLUCOSE-CAPILLARY: 111 mg/dL — AB (ref 70–99)
Glucose-Capillary: 102 mg/dL — ABNORMAL HIGH (ref 70–99)

## 2015-01-20 SURGERY — COLONOSCOPY WITH PROPOFOL
Anesthesia: Monitor Anesthesia Care

## 2015-01-20 MED ORDER — ONDANSETRON HCL 4 MG/2ML IJ SOLN
INTRAMUSCULAR | Status: AC
  Filled 2015-01-20: qty 2

## 2015-01-20 MED ORDER — LIDOCAINE HCL (PF) 1 % IJ SOLN
INTRAMUSCULAR | Status: AC
  Filled 2015-01-20: qty 5

## 2015-01-20 MED ORDER — MIDAZOLAM HCL 2 MG/2ML IJ SOLN
1.0000 mg | INTRAMUSCULAR | Status: DC | PRN
  Administered 2015-01-20 (×2): 2 mg via INTRAVENOUS
  Filled 2015-01-20: qty 2

## 2015-01-20 MED ORDER — FENTANYL CITRATE 0.05 MG/ML IJ SOLN
INTRAMUSCULAR | Status: AC
  Filled 2015-01-20: qty 2

## 2015-01-20 MED ORDER — MIDAZOLAM HCL 2 MG/2ML IJ SOLN
INTRAMUSCULAR | Status: AC
  Filled 2015-01-20: qty 2

## 2015-01-20 MED ORDER — LACTATED RINGERS IV SOLN
INTRAVENOUS | Status: DC
  Administered 2015-01-20: 08:00:00 via INTRAVENOUS

## 2015-01-20 MED ORDER — FENTANYL CITRATE 0.05 MG/ML IJ SOLN
25.0000 ug | INTRAMUSCULAR | Status: DC | PRN
  Administered 2015-01-20 (×2): 25 ug via INTRAVENOUS
  Filled 2015-01-20: qty 2

## 2015-01-20 MED ORDER — LACTATED RINGERS IV SOLN
INTRAVENOUS | Status: DC | PRN
  Administered 2015-01-20: 08:00:00 via INTRAVENOUS

## 2015-01-20 MED ORDER — MIDAZOLAM HCL 5 MG/5ML IJ SOLN
INTRAMUSCULAR | Status: DC | PRN
  Administered 2015-01-20 (×2): 1 mg via INTRAVENOUS

## 2015-01-20 MED ORDER — FENTANYL CITRATE 0.05 MG/ML IJ SOLN
25.0000 ug | INTRAMUSCULAR | Status: AC
  Administered 2015-01-20 (×2): 25 ug via INTRAVENOUS

## 2015-01-20 MED ORDER — STERILE WATER FOR IRRIGATION IR SOLN
Status: DC | PRN
  Administered 2015-01-20: 1000 mL

## 2015-01-20 MED ORDER — ONDANSETRON HCL 4 MG/2ML IJ SOLN
4.0000 mg | Freq: Once | INTRAMUSCULAR | Status: AC | PRN
  Administered 2015-01-20: 4 mg via INTRAVENOUS
  Filled 2015-01-20: qty 2

## 2015-01-20 MED ORDER — WATER FOR IRRIGATION, STERILE IR SOLN
Status: DC | PRN
  Administered 2015-01-20: 1000 mL via SURGICAL_CAVITY

## 2015-01-20 MED ORDER — DICYCLOMINE HCL 20 MG PO TABS: 20.0000 mg | ORAL_TABLET | Freq: Three times a day (TID) | ORAL | Status: DC

## 2015-01-20 MED ORDER — LIDOCAINE HCL (CARDIAC) 10 MG/ML IV SOLN
INTRAVENOUS | Status: DC | PRN
  Administered 2015-01-20: 50 mg via INTRAVENOUS

## 2015-01-20 MED ORDER — PROPOFOL 10 MG/ML IV BOLUS
INTRAVENOUS | Status: AC
  Filled 2015-01-20: qty 20

## 2015-01-20 MED ORDER — FENTANYL CITRATE 0.05 MG/ML IJ SOLN
INTRAMUSCULAR | Status: DC | PRN
  Administered 2015-01-20 (×4): 25 ug via INTRAVENOUS

## 2015-01-20 MED ORDER — PROPOFOL INFUSION 10 MG/ML OPTIME
INTRAVENOUS | Status: DC | PRN
  Administered 2015-01-20: 125 ug/kg/min via INTRAVENOUS

## 2015-01-20 MED ORDER — ONDANSETRON HCL 4 MG/2ML IJ SOLN
4.0000 mg | Freq: Once | INTRAMUSCULAR | Status: AC
  Administered 2015-01-20: 4 mg via INTRAVENOUS

## 2015-01-20 SURGICAL SUPPLY — 23 items
ELECT REM PT RETURN 9FT ADLT (ELECTROSURGICAL)
ELECTRODE REM PT RTRN 9FT ADLT (ELECTROSURGICAL) IMPLANT
FCP BXJMBJMB 240X2.8X (CUTTING FORCEPS)
FLOOR PAD 36X40 (MISCELLANEOUS) ×2
FORCEPS BIOP RAD 4 LRG CAP 4 (CUTTING FORCEPS) ×1 IMPLANT
FORCEPS BIOP RJ4 240 W/NDL (CUTTING FORCEPS)
FORCEPS BXJMBJMB 240X2.8X (CUTTING FORCEPS) IMPLANT
FORMALIN 10 PREFIL 20ML (MISCELLANEOUS) ×3 IMPLANT
INJECTOR/SNARE I SNARE (MISCELLANEOUS) IMPLANT
KIT CLEAN ENDO COMPLIANCE (KITS) ×2 IMPLANT
LUBRICANT JELLY 4.5OZ STERILE (MISCELLANEOUS) ×1 IMPLANT
MANIFOLD NEPTUNE II (INSTRUMENTS) ×1 IMPLANT
NDL SCLEROTHERAPY 25GX240 (NEEDLE) IMPLANT
NEEDLE SCLEROTHERAPY 25GX240 (NEEDLE) IMPLANT
PAD FLOOR 36X40 (MISCELLANEOUS) ×1 IMPLANT
PROBE APC STR FIRE (PROBE) IMPLANT
PROBE INJECTION GOLD (MISCELLANEOUS)
PROBE INJECTION GOLD 7FR (MISCELLANEOUS) IMPLANT
SNARE SHORT THROW 13M SML OVAL (MISCELLANEOUS) ×2 IMPLANT
SYR 50ML LL SCALE MARK (SYRINGE) ×1 IMPLANT
TRAP SPECIMEN MUCOUS 40CC (MISCELLANEOUS) IMPLANT
TUBING IRRIGATION ENDOGATOR (MISCELLANEOUS) ×1 IMPLANT
WATER STERILE IRR 1000ML POUR (IV SOLUTION) ×1 IMPLANT

## 2015-01-20 NOTE — Anesthesia Postprocedure Evaluation (Signed)
  Anesthesia Post-op Note  Patient: [name]  Procedure(s) Performed: Procedure(s) with comments: COLONOSCOPY WITH PROPOFOL (N/A) - In cecum @ 8:54, out @  9:15 COLON BIOPSY (N/A) RECTAL POLYPECTOMY (N/A)  Patient Location: PACU  Anesthesia Type:MAC  Level of Consciousness: awake, alert , oriented and patient cooperative  Airway and Oxygen Therapy: Patient Spontanous Breathing and Patient connected to nasal cannula oxygen  Post-op Pain: none  Post-op Assessment: Post-op Vital signs reviewed, Patient's Cardiovascular Status Stable, Respiratory Function Stable, Patent Airway, No signs of Nausea or vomiting and Pain level controlled  Post-op Vital Signs: Reviewed and stable  Last Vitals:  Filed Vitals:   01/20/15 0825  BP: 133/92  Pulse:   Temp:   Resp: 17    Complications: No apparent anesthesia complications

## 2015-01-20 NOTE — Transfer of Care (Signed)
Immediate Anesthesia Transfer of Care Note  Patient: [name]  Procedure(s) Performed: Procedure(s) with comments: COLONOSCOPY WITH PROPOFOL (N/A) - In cecum @ 8:54, out @  9:15 COLON BIOPSY (N/A) RECTAL POLYPECTOMY (N/A)  Patient Location: PACU  Anesthesia Type:MAC  Level of Consciousness: awake, alert , oriented and patient cooperative  Airway & Oxygen Therapy: Patient Spontanous Breathing and Patient connected to nasal cannula oxygen  Post-op Assessment: Report given to RN and Post -op Vital signs reviewed and stable  Post vital signs: Reviewed and stable  Last Vitals:  Filed Vitals:   01/20/15 0825  BP: 133/92  Pulse:   Temp:   Resp: 17    Complications: No apparent anesthesia complications

## 2015-01-20 NOTE — H&P (Signed)
Primary Care Physician:  [name], MD Primary Gastroenterologist:  Dr. Darrick Penna  Pre-Procedure History & Physical: HPI:  [name] is a 54 y.o. female here for BRBPR/colitis in CT JAN 2016. LOOSE STOOLS/LOWER BAD PAIN CONTINUES.  Past Medical History  Diagnosis Date  . Gastritis   . Fibromyalgia   . Edema of lower extremity   . DDD (degenerative disc disease)   . MS (multiple sclerosis)     early stages  . Neuropathy   . COPD (chronic obstructive pulmonary disease)   . Hiatal hernia   . Gout   . TIA (transient ischemic attack)     Hx: of  . Asthma     Albuterol prn   . Depression     takes Celexa,Clonazepam daily  . Muscle spasm     takes Flexeril daily as needed  . GERD (gastroesophageal reflux disease)     takes Pepcid daily  . CHF (congestive heart failure)     takes Furosemide daily  . Hypothyroidism     takes Synthroid daily  . HTN (hypertension)     takes lisinopril daily  . Hyperlipidemia     but doesn't take any meds  . Phlebitis   . Emphysema lung   . Shortness of breath     with exertion or stressed   . Pneumonia     hx of;last time in 2014  . History of bronchitis     last time 4-102months ago   . Migraine     has one now  . Migraine   . Stroke     left sided weakness  . Joint pain   . Joint swelling   . Chronic back pain     DDD/scoliosis  . Osteoporosis   . Hemorrhoids   . History of colon polyps   . Urinary frequency   . History of kidney stones     is in her left kidney and has been for couple of yrs and no problems  . Diabetes mellitus     borderline  . Insomnia     doesn't take any meds for this  . Gout of big toe     RIGHT  . Complication of anesthesia     hard to wake up  . PONV (postoperative nausea and vomiting)   . Sleep apnea     sleep study done at least 51yrs ago;has CPAP but doesn't use  . Anxiety     Past Surgical History  Procedure Laterality Date  . Fundic gland polyp      benign  . Sigmoidoscopy  01/31/08    large internal hemorrhoids/small rectal polyp removed/rare sigmoid diverticula  . Esophagogastroduodenoscopy  10/29/07    normal  . Cholecystectomy    . Bladder suspension    . Abdominal hysterectomy    . Carpal tunnel release Right   . Cardiac catheterization    . Colonoscopy    . Anterior cervical decomp/discectomy fusion N/A 12/30/2013    Procedure: ANTERIOR CERVICAL DISCECTOMY FUSION C4-5, C5-6, plate and screws, allograft, local bone graft;  Surgeon: Kerrin Champagne, MD;  Location: [facility] OR;  Service: Orthopedics;  Laterality: N/A;  . Back surgery      lumbar discectomy    Prior to Admission medications   Medication Sig Start Date End Date Taking? Authorizing Provider  acetaminophen (TYLENOL) 500 MG tablet Take 1,000 mg by mouth every 4 (four) hours as needed for moderate pain.   Yes Historical Provider, MD  albuterol (PROVENTIL HFA;VENTOLIN  HFA) 108 (90 BASE) MCG/ACT inhaler Inhale 2 puffs into the lungs every 6 (six) hours as needed. Shortness of breath.   Yes Historical Provider, MD  ALPRAZolam Prudy Feeler) 1 MG tablet Take 1 mg by mouth 3 (three) times daily as needed for anxiety.   Yes Historical Provider, MD  citalopram (CELEXA) 40 MG tablet Take 40 mg by mouth daily.   Yes Historical Provider, MD  cyclobenzaprine (FLEXERIL) 10 MG tablet Take 10 mg by mouth 3 (three) times daily as needed for muscle spasms.   Yes Historical Provider, MD  dicyclomine (BENTYL) 20 MG tablet Take 1 tablet by mouth 3 (three) times daily. 11/25/14  Yes Historical Provider, MD  famotidine (PEPCID) 20 MG tablet Take 1 tablet (20 mg total) by mouth daily as needed for heartburn. Patient taking differently: Take 40 mg by mouth daily.  11/20/14  Yes Erick Blinks, MD  Fluticasone Furoate-Vilanterol (BREO ELLIPTA) 100-25 MCG/INH AEPB Inhale 1 puff into the lungs daily.   Yes Historical Provider, MD  furosemide (LASIX) 20 MG tablet Take 20 mg by mouth daily.   Yes Historical Provider, MD  HYDROcodone-acetaminophen  (NORCO) 10-325 MG per tablet Take 1 tablet by mouth every 4 (four) hours as needed for moderate pain or severe pain. 11/20/14  Yes Erick Blinks, MD  levothyroxine (SYNTHROID, LEVOTHROID) 88 MCG tablet Take 88 mcg by mouth daily before breakfast.   Yes Historical Provider, MD  lisinopril (PRINIVIL,ZESTRIL) 40 MG tablet Take 40 mg by mouth daily.   Yes Historical Provider, MD  ondansetron (ZOFRAN) 4 MG tablet Take 1 tablet (4 mg total) by mouth every 6 (six) hours as needed for nausea. 11/20/14  Yes Erick Blinks, MD  hydrocortisone (ANUSOL-HC) 2.5 % rectal cream Place 1 application rectally 2 (two) times daily. Patient not taking: Reported on 01/12/2015 12/09/14   Nira Retort, NP  metroNIDAZOLE (FLAGYL) 500 MG tablet Take 1 tablet (500 mg total) by mouth 3 (three) times daily. Patient not taking: Reported on 01/12/2015 11/20/14   Erick Blinks, MD  pantoprazole (PROTONIX) 40 MG tablet Take 1 tablet (40 mg total) by mouth daily. Patient not taking: Reported on 01/12/2015 11/20/14   Erick Blinks, MD  polyethylene glycol-electrolytes (TRILYTE) 420 G solution Take 4,000 mLs by mouth as directed. 01/08/15   West Bali, MD    Allergies as of 01/08/2015 - Review Complete 01/08/2015  Allergen Reaction Noted  . Sulfa antibiotics Anaphylaxis 08/06/2012  . Demerol [meperidine] Swelling 08/06/2012  . Penicillins Swelling 08/06/2012  . Levaquin [levofloxacin in d5w]  04/10/2014  . Neurontin [gabapentin] Other (See Comments) 11/04/2013  . Toradol [ketorolac tromethamine] Other (See Comments) 11/15/2014  . Tramadol Other (See Comments) 01/08/2015  . Doxycycline Nausea And Vomiting 09/21/2012    Family History  Problem Relation Age of Onset  . Diabetes Mother   . Hypertension Mother   . Arthritis Father   . Diabetes Sister   . Neuropathy Sister   . Transient ischemic attack Sister   . Heart disease Sister   . Hypertension Sister   . Cancer - Lung Other   . Cancer - Prostate Other   . Cancer - Other  Other   . Colon cancer Paternal Uncle   . Colon cancer Brother     deceased age 38 from colon CA and brain CA    History   Social History  . Marital Status: Married    Spouse Name: N/A  . Number of Children: N/A  . Years of Education: N/A  Occupational History  . Not on file.   Social History Main Topics  . Smoking status: Current Some Day Smoker -- 0.50 packs/day for 15 years    Types: Cigarettes  . Smokeless tobacco: Never Used  . Alcohol Use: No  . Drug Use: No  . Sexual Activity: Yes    Birth Control/ Protection: Surgical   Other Topics Concern  . Not on file   Social History Narrative    Review of Systems: See HPI, otherwise negative ROS   Physical Exam: There were no vitals taken for this visit. General:   Alert,  pleasant and cooperative in NAD Head:  Normocephalic and atraumatic. Neck:  Supple; Lungs:  Clear throughout to auscultation.    Heart:  Regular rate and rhythm. Abdomen:  Soft, MILD bLQ TTP, nondistended. Normal bowel sounds, without guarding, and without rebound.   Neurologic:  Alert and  oriented x4;  grossly normal neurologically.  Impression/Plan:    BRBPR/colitis in ct jan 2016.  PLAN: TCS TODAY

## 2015-01-20 NOTE — Op Note (Signed)
[facility] [address] [place], [address]   COLONOSCOPY PROCEDURE REPORT  PATIENT: [name], [name]  MR#: [id number] BIRTHDATE: 1961/02/08 , 53  yrs. old GENDER: female ENDOSCOPIST: West Bali, MD REFERRED ZS:MOLM Hall, M.D. PROCEDURE DATE:  02/02/15 PROCEDURE:   Colonoscopy with biopsy and Colonoscopy with cold biopsy polypectomy INDICATIONS:abdominal pain and change in bowel habits. MEDICATIONS: Monitored anesthesia care  DESCRIPTION OF PROCEDURE:    Physical exam was performed.  Informed consent was obtained from the patient after explaining the benefits, risks, and alternatives to procedure.  The patient was connected to monitor and placed in left lateral position. Continuous oxygen was provided by nasal cannula and IV medicine administered through an indwelling cannula.  After administration of sedation and rectal exam, the patients rectum was intubated and the     colonoscope was advanced under direct visualization to the ileum.  The scope was removed slowly by carefully examining the color, texture, anatomy, and integrity mucosa on the way out.  The patient was recovered in endoscopy and discharged home in satisfactory condition.    COLON FINDINGS: The examined terminal ileum appeared to be normal. , The colonic mucosa appeared normal.  A polypectomy was performed. , Moderate sized internal hemorrhoids were found.  , Large external hemorrhoids were found.  , and The colon was redundant. Manual abdominal counter-pressure was used to reach the cecum.  PREP QUALITY: good.  CECAL W/D TIME: 20       minutes COMPLICATIONS: None  ENDOSCOPIC IMPRESSION: 1.   NORMAL ileum 2.   REDUNDNAT LEFT COLON 3.   Small internal hemorrhoids 4.   Large external hemorrhoids  RECOMMENDATIONS: DRINK WATER FOLLOW A HIGH FIBER DIET. TAKE DICYCLOMINE 20 MG TABLETS ONE 30 MINUTES PRIOR MEALS AND AT BEDTIME TO TREAT ABDOMINAL PAIN AND LOOSE STOOLS.  IT MAY  CAUSE DROWSINESS, DRY EYES/MOUTH, BLURRY VISION, OR DIFFICULTY URINATING. AWAIT BIOPSY. FOLLOW UP IN 4 MOS. Next colonoscopy in 10 years. CONSIDER OVERTUBE.    _______________________________ eSignedWest Bali, MD 02-02-2015 3:37 PM   CPT CODES: ICD CODES:  The ICD and CPT codes recommended by this software are interpretations from the data that the clinical staff has captured with the software.  The verification of the translation of this report to the ICD and CPT codes and modifiers is the sole responsibility of the health care institution and practicing physician where this report was generated.  [employer] will not be held responsible for the validity of the ICD and CPT codes included on this report.  AMA assumes no liability for data contained or not contained herein. CPT is a Publishing rights manager of the [employer].

## 2015-01-20 NOTE — Anesthesia Preprocedure Evaluation (Signed)
Anesthesia Evaluation  Patient identified by MRN, date of birth, ID band Patient awake    Reviewed: Allergy & Precautions, H&P , NPO status , Patient's Chart, lab work & pertinent test results, reviewed documented beta blocker date and time   History of Anesthesia Complications (+) PONV, PROLONGED EMERGENCE and history of anesthetic complications  Airway Mallampati: II  TM Distance: >3 FB Neck ROM: full    Dental  (+) Edentulous Upper, Edentulous Lower, Dental Advisory Given   Pulmonary shortness of breath and with exertion, asthma , sleep apnea , pneumonia -, resolved, COPD COPD inhaler, Current Smoker,  breath sounds clear to auscultation        Cardiovascular hypertension, On Medications and Pt. on medications +CHF Rhythm:regular     Neuro/Psych  Headaches, PSYCHIATRIC DISORDERS Anxiety Depression TIA Neuromuscular disease CVA, Residual Symptoms negative psych ROS   GI/Hepatic Neg liver ROS, hiatal hernia, GERD-  Medicated and Controlled,  Endo/Other  negative endocrine ROSdiabetes (borderline)Hypothyroidism   Renal/GU negative Renal ROS  negative genitourinary   Musculoskeletal  (+) Arthritis -, Fibromyalgia -  Abdominal   Peds  Hematology negative hematology ROS (+)   Anesthesia Other Findings See surgeon's H&P   Reproductive/Obstetrics negative OB ROS                             Anesthesia Physical Anesthesia Plan  ASA: III  Anesthesia Plan: MAC   Post-op Pain Management:    Induction: Intravenous  Airway Management Planned: Simple Face Mask  Additional Equipment:   Intra-op Plan:   Post-operative Plan:   Informed Consent: I have reviewed the patients History and Physical, chart, labs and discussed the procedure including the risks, benefits and alternatives for the proposed anesthesia with the patient or authorized representative who has indicated his/her understanding and  acceptance.     Plan Discussed with:   Anesthesia Plan Comments:         Anesthesia Quick Evaluation

## 2015-01-20 NOTE — Discharge Instructions (Addendum)
You had 2 small polyps removed FROM YOUR RECTUM. Your colon and small bowel LOOK normal. You have MODERATE INTERNAL hemorrhoids AND LARGE EXTERNAL HEMORRHOIDS.  I BIOPSIED YOUR COLON, AND RECTUM.  DRINK WATER TO KEEP YOUR URINE LIGHT YELLOW.  FOLLOW A HIGH FIBER DIET. AVOID ITEMS THAT CAUSE BLOATING & GAS. SEE INFO BELOW.  TAKE DICYCLOMINE 20 MG TABLETS ONE 30 MINUTES PRIOR MEALS AND AT BEDTIME TO TREAT ABDOMINAL PAIN AND LOOSE STOOLS. IT MAY CAUSE DROWSINESS, DRY EYES/MOUTH, BLURRY VISION, OR DIFFICULTY URINATING.  YOUR BIOPSY RESULTS WILL BE AVAILABLE IN MY CHART AFTER MAR 17   OR MY OFFICE WILL CONTACT YOU IN 10-14 DAYS WITH YOUR RESULTS.   FOLLOW UP IN 4 MOS.   Next colonoscopy in 10 years.    Colonoscopy Care After Read the instructions outlined below and refer to this sheet in the next week. These discharge instructions provide you with general information on caring for yourself after you leave the hospital. While your treatment has been planned according to the most current medical practices available, unavoidable complications occasionally occur. If you have any problems or questions after discharge, call DR. Acel Natzke, [phone].  ACTIVITY  You may resume your regular activity, but move at a slower pace for the next 24 hours.   Take frequent rest periods for the next 24 hours.   Walking will help get rid of the air and reduce the bloated feeling in your belly (abdomen).   No driving for 24 hours (because of the medicine (anesthesia) used during the test).   You may shower.   Do not sign any important legal documents or operate any machinery for 24 hours (because of the anesthesia used during the test).    NUTRITION  Drink plenty of fluids.   You may resume your normal diet as instructed by your doctor.   Begin with a light meal and progress to your normal diet. Heavy or fried foods are harder to digest and may make you feel sick to your stomach (nauseated).   Avoid  alcoholic beverages for 24 hours or as instructed.    MEDICATIONS  You may resume your normal medications.   WHAT YOU CAN EXPECT TODAY  Some feelings of bloating in the abdomen.   Passage of more gas than usual.   Spotting of blood in your stool or on the toilet paper  .  IF YOU HAD POLYPS REMOVED DURING THE COLONOSCOPY:  Eat a soft diet IF YOU HAVE NAUSEA, BLOATING, ABDOMINAL PAIN, OR VOMITING.    FINDING OUT THE RESULTS OF YOUR TEST Not all test results are available during your visit. DR. Darrick Penna WILL CALL YOU WITHIN 14 DAYS OF YOUR PROCEDUE WITH YOUR RESULTS. Do not assume everything is normal if you have not heard from DR. Lurlene Ronda, CALL HER OFFICE AT [phone].  SEEK IMMEDIATE MEDICAL ATTENTION AND CALL THE OFFICE: [phone] IF:  You have more than a spotting of blood in your stool.   Your belly is swollen (abdominal distention).   You are nauseated or vomiting.   You have a temperature over 101F.   You have abdominal pain or discomfort that is severe or gets worse throughout the day.   High-Fiber Diet A high-fiber diet changes your normal diet to include more whole grains, legumes, fruits, and vegetables. Changes in the diet involve replacing refined carbohydrates with unrefined foods. The calorie level of the diet is essentially unchanged. The Dietary Reference Intake (recommended amount) for adult males is 38 grams per day. For  adult females, it is 25 grams per day. Pregnant and lactating women should consume 28 grams of fiber per day. Fiber is the intact part of a plant that is not broken down during digestion. Functional fiber is fiber that has been isolated from the plant to provide a beneficial effect in the body. PURPOSE  Increase stool bulk.   Ease and regulate bowel movements.   Lower cholesterol.  INDICATIONS THAT YOU NEED MORE FIBER  Constipation and hemorrhoids.   Uncomplicated diverticulosis (intestine condition) and irritable bowel syndrome.    Weight management.   As a protective measure against hardening of the arteries (atherosclerosis), diabetes, and cancer.   GUIDELINES FOR INCREASING FIBER IN THE DIET  Start adding fiber to the diet slowly. A gradual increase of about 5 more grams (2 slices of whole-wheat bread, 2 servings of most fruits or vegetables, or 1 bowl of high-fiber cereal) per day is best. Too rapid an increase in fiber may result in constipation, flatulence, and bloating.   Drink enough water and fluids to keep your urine clear or pale yellow. Water, juice, or caffeine-free drinks are recommended. Not drinking enough fluid may cause constipation.   Eat a variety of high-fiber foods rather than one type of fiber.   Try to increase your intake of fiber through using high-fiber foods rather than fiber pills or supplements that contain small amounts of fiber.   The goal is to change the types of food eaten. Do not supplement your present diet with high-fiber foods, but replace foods in your present diet.  INCLUDE A VARIETY OF FIBER SOURCES  Replace refined and processed grains with whole grains, canned fruits with fresh fruits, and incorporate other fiber sources. White rice, white breads, and most bakery goods contain little or no fiber.   Brown whole-grain rice, buckwheat oats, and many fruits and vegetables are all good sources of fiber. These include: broccoli, Brussels sprouts, cabbage, cauliflower, beets, sweet potatoes, white potatoes (skin on), carrots, tomatoes, eggplant, squash, berries, fresh fruits, and dried fruits.   Cereals appear to be the richest source of fiber. Cereal fiber is found in whole grains and bran. Bran is the fiber-rich outer coat of cereal grain, which is largely removed in refining. In whole-grain cereals, the bran remains. In breakfast cereals, the largest amount of fiber is found in those with "bran" in their names. The fiber content is sometimes indicated on the label.   You may  need to include additional fruits and vegetables each day.   In baking, for 1 cup white flour, you may use the following substitutions:   1 cup whole-wheat flour minus 2 tablespoons.   1/2 cup white flour plus 1/2 cup whole-wheat flour.   Polyps, Colon  A polyp is extra tissue that grows inside your body. Colon polyps grow in the large intestine. The large intestine, also called the colon, is part of your digestive system. It is a long, hollow tube at the end of your digestive tract where your body makes and stores stool. Most polyps are not dangerous. They are benign. This means they are not cancerous. But over time, some types of polyps can turn into cancer. Polyps that are smaller than a pea are usually not harmful. But larger polyps could someday become or may already be cancerous. To be safe, doctors remove all polyps and test them.   WHO GETS POLYPS? Anyone can get polyps, but certain people are more likely than others. You may have a greater chance of  getting polyps if:  You are over 50.   You have had polyps before.   Someone in your family has had polyps.   Someone in your family has had cancer of the large intestine.   Find out if someone in your family has had polyps. You may also be more likely to get polyps if you:   Eat a lot of fatty foods   Smoke   Drink alcohol   Do not exercise  Eat too much   TREATMENT  The caregiver will remove the polyp during sigmoidoscopy or colonoscopy.    PREVENTION There is not one sure way to prevent polyps. You might be able to lower your risk of getting them if you:  Eat more fruits and vegetables and less fatty food.   Do not smoke.   Avoid alcohol.   Exercise every day.   Lose weight if you are overweight.   Eating more calcium and folate can also lower your risk of getting polyps. Some foods that are rich in calcium are milk, cheese, and broccoli. Some foods that are rich in folate are chickpeas, kidney beans, and  spinach.   Hemorrhoids Hemorrhoids are dilated (enlarged) veins around the rectum. Sometimes clots will form in the veins. This makes them swollen and painful. These are called thrombosed hemorrhoids. Causes of hemorrhoids include:  Constipation.   Straining to have a bowel movement.   HEAVY LIFTING HOME CARE INSTRUCTIONS  Eat a well balanced diet and drink 6 to 8 glasses of water every day to avoid constipation. You may also use a bulk laxative.   Avoid straining to have bowel movements.   Keep anal area dry and clean.   Do not use a donut shaped pillow or sit on the toilet for long periods. This increases blood pooling and pain.   Move your bowels when your body has the urge; this will require less straining and will decrease pain and pressure.   High-Fiber Diet A high-fiber diet changes your normal diet to include more whole grains, legumes, fruits, and vegetables. Changes in the diet involve replacing refined carbohydrates with unrefined foods. The calorie level of the diet is essentially unchanged. The Dietary Reference Intake (recommended amount) for adult males is 38 grams per day. For adult females, it is 25 grams per day. Pregnant and lactating women should consume 28 grams of fiber per day. Fiber is the intact part of a plant that is not broken down during digestion. Functional fiber is fiber that has been isolated from the plant to provide a beneficial effect in the body. PURPOSE  Increase stool bulk.   Ease and regulate bowel movements.   Lower cholesterol.  INDICATIONS THAT YOU NEED MORE FIBER  Constipation and hemorrhoids.   Uncomplicated diverticulosis (intestine condition) and irritable bowel syndrome.   Weight management.   As a protective measure against hardening of the arteries (atherosclerosis), diabetes, and cancer.   GUIDELINES FOR INCREASING FIBER IN THE DIET  Start adding fiber to the diet slowly. A gradual increase of about 5 more grams (2 slices  of whole-wheat bread, 2 servings of most fruits or vegetables, or 1 bowl of high-fiber cereal) per day is best. Too rapid an increase in fiber may result in constipation, flatulence, and bloating.   Drink enough water and fluids to keep your urine clear or pale yellow. Water, juice, or caffeine-free drinks are recommended. Not drinking enough fluid may cause constipation.   Eat a variety of high-fiber foods rather than one  type of fiber.   Try to increase your intake of fiber through using high-fiber foods rather than fiber pills or supplements that contain small amounts of fiber.   The goal is to change the types of food eaten. Do not supplement your present diet with high-fiber foods, but replace foods in your present diet.  INCLUDE A VARIETY OF FIBER SOURCES  Replace refined and processed grains with whole grains, canned fruits with fresh fruits, and incorporate other fiber sources. White rice, white breads, and most bakery goods contain little or no fiber.   Brown whole-grain rice, buckwheat oats, and many fruits and vegetables are all good sources of fiber. These include: broccoli, Brussels sprouts, cabbage, cauliflower, beets, sweet potatoes, white potatoes (skin on), carrots, tomatoes, eggplant, squash, berries, fresh fruits, and dried fruits.   Cereals appear to be the richest source of fiber. Cereal fiber is found in whole grains and bran. Bran is the fiber-rich outer coat of cereal grain, which is largely removed in refining. In whole-grain cereals, the bran remains. In breakfast cereals, the largest amount of fiber is found in those with "bran" in their names. The fiber content is sometimes indicated on the label.   You may need to include additional fruits and vegetables each day.   In baking, for 1 cup white flour, you may use the following substitutions:   1 cup whole-wheat flour minus 2 tablespoons.   1/2 cup white flour plus 1/2 cup whole-wheat flour.

## 2015-01-20 NOTE — Anesthesia Procedure Notes (Signed)
Procedure Name: MAC Date/Time: 01/20/2015 8:31 AM Performed by: Pernell Dupre, AMY A Pre-anesthesia Checklist: Patient identified, Timeout performed, Emergency Drugs available, Suction available and Patient being monitored Oxygen Delivery Method: Simple face mask

## 2015-01-20 NOTE — Addendum Note (Signed)
Addendum  created 01/20/15 8182 by Earleen Newport, CRNA   Modules edited: Charges VN

## 2015-01-21 ENCOUNTER — Encounter (HOSPITAL_COMMUNITY): Payer: Self-pay | Admitting: Gastroenterology

## 2015-02-11 ENCOUNTER — Telehealth: Payer: Self-pay | Admitting: Gastroenterology

## 2015-02-11 NOTE — Telephone Encounter (Signed)
ON RECALL LIST  °

## 2015-02-11 NOTE — Telephone Encounter (Signed)
Called pt and LMOM.  

## 2015-02-11 NOTE — Telephone Encounter (Signed)
Please call pt. She had HYPERPLASTIC POLYPS removed. Please call pt. HIS colon biopsies are normal  DRINK WATER EAT FIBER. TAKE BENTYL BEFORE MEALS & at bedtime. IF IT IS MOT HELPING or I CAN WRITE FOR A NEW DRUG TO CONTROL DIARRHEA  AND ABDOMINAL PAIN CALLED VIBERZI. FOLLOW UP IN 4 MOS E30 ABD PAIN, DIARRHEA NEXT TCS IN 10 YEARS.

## 2015-02-11 NOTE — Telephone Encounter (Signed)
Reminder in epic °

## 2015-02-12 NOTE — Telephone Encounter (Signed)
Pt called and was informed of results.  

## 2015-02-12 NOTE — Telephone Encounter (Signed)
LMOM to call and letter mailed to call.  

## 2015-03-10 ENCOUNTER — Ambulatory Visit: Payer: Self-pay | Admitting: Nurse Practitioner

## 2015-04-05 ENCOUNTER — Encounter (HOSPITAL_COMMUNITY): Payer: Self-pay | Admitting: Emergency Medicine

## 2015-04-05 ENCOUNTER — Emergency Department (HOSPITAL_COMMUNITY): Payer: 59

## 2015-04-05 ENCOUNTER — Observation Stay (HOSPITAL_COMMUNITY)
Admission: EM | Admit: 2015-04-05 | Discharge: 2015-04-08 | Disposition: A | Discharge status: Home / Self Care | Payer: 59 | Attending: Internal Medicine | Admitting: Internal Medicine

## 2015-04-05 DIAGNOSIS — E039 Hypothyroidism, unspecified: Secondary | ICD-10-CM | POA: Insufficient documentation

## 2015-04-05 DIAGNOSIS — R51 Headache: Secondary | ICD-10-CM | POA: Diagnosis present

## 2015-04-05 DIAGNOSIS — I809 Phlebitis and thrombophlebitis of unspecified site: Secondary | ICD-10-CM | POA: Diagnosis not present

## 2015-04-05 DIAGNOSIS — G35 Multiple sclerosis: Secondary | ICD-10-CM | POA: Diagnosis not present

## 2015-04-05 DIAGNOSIS — Z79899 Other long term (current) drug therapy: Secondary | ICD-10-CM | POA: Diagnosis not present

## 2015-04-05 DIAGNOSIS — R739 Hyperglycemia, unspecified: Secondary | ICD-10-CM | POA: Diagnosis present

## 2015-04-05 DIAGNOSIS — K649 Unspecified hemorrhoids: Secondary | ICD-10-CM | POA: Insufficient documentation

## 2015-04-05 DIAGNOSIS — G629 Polyneuropathy, unspecified: Secondary | ICD-10-CM | POA: Insufficient documentation

## 2015-04-05 DIAGNOSIS — K2970 Gastritis, unspecified, without bleeding: Secondary | ICD-10-CM | POA: Diagnosis not present

## 2015-04-05 DIAGNOSIS — F329 Major depressive disorder, single episode, unspecified: Secondary | ICD-10-CM | POA: Insufficient documentation

## 2015-04-05 DIAGNOSIS — G4700 Insomnia, unspecified: Secondary | ICD-10-CM | POA: Diagnosis not present

## 2015-04-05 DIAGNOSIS — G43909 Migraine, unspecified, not intractable, without status migrainosus: Principal | ICD-10-CM | POA: Diagnosis present

## 2015-04-05 DIAGNOSIS — M810 Age-related osteoporosis without current pathological fracture: Secondary | ICD-10-CM | POA: Diagnosis not present

## 2015-04-05 DIAGNOSIS — G8929 Other chronic pain: Secondary | ICD-10-CM | POA: Insufficient documentation

## 2015-04-05 DIAGNOSIS — M797 Fibromyalgia: Secondary | ICD-10-CM | POA: Diagnosis not present

## 2015-04-05 DIAGNOSIS — K449 Diaphragmatic hernia without obstruction or gangrene: Secondary | ICD-10-CM | POA: Insufficient documentation

## 2015-04-05 DIAGNOSIS — G894 Chronic pain syndrome: Secondary | ICD-10-CM | POA: Diagnosis present

## 2015-04-05 DIAGNOSIS — M2540 Effusion, unspecified joint: Secondary | ICD-10-CM | POA: Diagnosis not present

## 2015-04-05 DIAGNOSIS — Z8701 Personal history of pneumonia (recurrent): Secondary | ICD-10-CM | POA: Insufficient documentation

## 2015-04-05 DIAGNOSIS — Z7951 Long term (current) use of inhaled steroids: Secondary | ICD-10-CM | POA: Insufficient documentation

## 2015-04-05 DIAGNOSIS — M62838 Other muscle spasm: Secondary | ICD-10-CM | POA: Diagnosis not present

## 2015-04-05 DIAGNOSIS — G4730 Sleep apnea, unspecified: Secondary | ICD-10-CM | POA: Insufficient documentation

## 2015-04-05 DIAGNOSIS — K219 Gastro-esophageal reflux disease without esophagitis: Secondary | ICD-10-CM | POA: Diagnosis not present

## 2015-04-05 DIAGNOSIS — I10 Essential (primary) hypertension: Secondary | ICD-10-CM | POA: Diagnosis not present

## 2015-04-05 DIAGNOSIS — Z880 Allergy status to penicillin: Secondary | ICD-10-CM | POA: Insufficient documentation

## 2015-04-05 DIAGNOSIS — J449 Chronic obstructive pulmonary disease, unspecified: Secondary | ICD-10-CM | POA: Diagnosis not present

## 2015-04-05 DIAGNOSIS — M2550 Pain in unspecified joint: Secondary | ICD-10-CM | POA: Insufficient documentation

## 2015-04-05 DIAGNOSIS — R519 Headache, unspecified: Secondary | ICD-10-CM | POA: Insufficient documentation

## 2015-04-05 DIAGNOSIS — R531 Weakness: Secondary | ICD-10-CM | POA: Insufficient documentation

## 2015-04-05 DIAGNOSIS — Z8673 Personal history of transient ischemic attack (TIA), and cerebral infarction without residual deficits: Secondary | ICD-10-CM | POA: Diagnosis not present

## 2015-04-05 DIAGNOSIS — I639 Cerebral infarction, unspecified: Secondary | ICD-10-CM | POA: Diagnosis present

## 2015-04-05 DIAGNOSIS — F419 Anxiety disorder, unspecified: Secondary | ICD-10-CM | POA: Insufficient documentation

## 2015-04-05 DIAGNOSIS — M109 Gout, unspecified: Secondary | ICD-10-CM | POA: Insufficient documentation

## 2015-04-05 DIAGNOSIS — F17200 Nicotine dependence, unspecified, uncomplicated: Secondary | ICD-10-CM | POA: Diagnosis present

## 2015-04-05 DIAGNOSIS — G43809 Other migraine, not intractable, without status migrainosus: Secondary | ICD-10-CM | POA: Diagnosis not present

## 2015-04-05 DIAGNOSIS — M1000 Idiopathic gout, unspecified site: Secondary | ICD-10-CM | POA: Insufficient documentation

## 2015-04-05 DIAGNOSIS — E119 Type 2 diabetes mellitus without complications: Secondary | ICD-10-CM

## 2015-04-05 DIAGNOSIS — Z720 Tobacco use: Secondary | ICD-10-CM | POA: Diagnosis not present

## 2015-04-05 DIAGNOSIS — F17210 Nicotine dependence, cigarettes, uncomplicated: Secondary | ICD-10-CM | POA: Diagnosis present

## 2015-04-05 DIAGNOSIS — E785 Hyperlipidemia, unspecified: Secondary | ICD-10-CM | POA: Diagnosis present

## 2015-04-05 DIAGNOSIS — I509 Heart failure, unspecified: Secondary | ICD-10-CM | POA: Diagnosis not present

## 2015-04-05 LAB — CBC WITH DIFFERENTIAL/PLATELET
Basophils Absolute: 0.1 10*3/uL (ref 0.0–0.1)
Basophils Relative: 1 % (ref 0–1)
Eosinophils Absolute: 0.3 10*3/uL (ref 0.0–0.7)
Eosinophils Relative: 5 % (ref 0–5)
HEMATOCRIT: 37.7 % (ref 36.0–46.0)
Hemoglobin: 12.6 g/dL (ref 12.0–15.0)
LYMPHS ABS: 1.7 10*3/uL (ref 0.7–4.0)
LYMPHS PCT: 33 % (ref 12–46)
MCH: 30.5 pg (ref 26.0–34.0)
MCHC: 33.4 g/dL (ref 30.0–36.0)
MCV: 91.3 fL (ref 78.0–100.0)
MONOS PCT: 9 % (ref 3–12)
Monocytes Absolute: 0.5 10*3/uL (ref 0.1–1.0)
Neutro Abs: 2.8 10*3/uL (ref 1.7–7.7)
Neutrophils Relative %: 52 % (ref 43–77)
Platelets: 241 10*3/uL (ref 150–400)
RBC: 4.13 MIL/uL (ref 3.87–5.11)
RDW: 15 % (ref 11.5–15.5)
WBC: 5.3 10*3/uL (ref 4.0–10.5)

## 2015-04-05 LAB — COMPREHENSIVE METABOLIC PANEL
ALT: 11 U/L — AB (ref 14–54)
AST: 15 U/L (ref 15–41)
Albumin: 4 g/dL (ref 3.5–5.0)
Alkaline Phosphatase: 65 U/L (ref 38–126)
Anion gap: 10 (ref 5–15)
BUN: 9 mg/dL (ref 6–20)
CO2: 30 mmol/L (ref 22–32)
CREATININE: 0.81 mg/dL (ref 0.44–1.00)
Calcium: 9 mg/dL (ref 8.9–10.3)
Chloride: 102 mmol/L (ref 101–111)
GFR calc non Af Amer: >60 mL/min (ref >60)
GLUCOSE: 103 mg/dL — AB (ref 65–99)
POTASSIUM: 3.1 mmol/L — AB (ref 3.5–5.1)
SODIUM: 142 mmol/L (ref 135–145)
TOTAL PROTEIN: 6.8 g/dL (ref 6.5–8.1)
Total Bilirubin: 0.4 mg/dL (ref 0.3–1.2)

## 2015-04-05 LAB — GLUCOSE, CAPILLARY: GLUCOSE-CAPILLARY: 117 mg/dL — AB (ref 65–99)

## 2015-04-05 MED ORDER — PROMETHAZINE HCL 12.5 MG PO TABS
12.5000 mg | ORAL_TABLET | Freq: Four times a day (QID) | ORAL | Status: DC | PRN
  Administered 2015-04-06 – 2015-04-07 (×2): 12.5 mg via ORAL
  Filled 2015-04-05 (×2): qty 1

## 2015-04-05 MED ORDER — ALPRAZOLAM 1 MG PO TABS
1.0000 mg | ORAL_TABLET | Freq: Two times a day (BID) | ORAL | Status: DC
  Administered 2015-04-05 – 2015-04-08 (×6): 1 mg via ORAL
  Filled 2015-04-05 (×6): qty 1

## 2015-04-05 MED ORDER — CITALOPRAM HYDROBROMIDE 20 MG PO TABS
40.0000 mg | ORAL_TABLET | Freq: Every day | ORAL | Status: DC
  Administered 2015-04-06 – 2015-04-08 (×3): 40 mg via ORAL
  Filled 2015-04-05 (×3): qty 2

## 2015-04-05 MED ORDER — HYDROCODONE-ACETAMINOPHEN 10-325 MG PO TABS
1.0000 | ORAL_TABLET | ORAL | Status: DC | PRN
  Administered 2015-04-05 – 2015-04-06 (×4): 1 via ORAL
  Filled 2015-04-05 (×4): qty 1

## 2015-04-05 MED ORDER — FAMOTIDINE 20 MG PO TABS
40.0000 mg | ORAL_TABLET | Freq: Every day | ORAL | Status: DC
  Administered 2015-04-06 – 2015-04-08 (×3): 40 mg via ORAL
  Filled 2015-04-05 (×3): qty 2

## 2015-04-05 MED ORDER — INSULIN ASPART 100 UNIT/ML ~~LOC~~ SOLN: 0.0000 [IU] | Freq: Every day | SUBCUTANEOUS | Status: DC

## 2015-04-05 MED ORDER — ALBUTEROL SULFATE (2.5 MG/3ML) 0.083% IN NEBU: 3.0000 mL | INHALATION_SOLUTION | RESPIRATORY_TRACT | Status: DC | PRN

## 2015-04-05 MED ORDER — ASPIRIN 81 MG PO CHEW
324.0000 mg | CHEWABLE_TABLET | Freq: Once | ORAL | Status: AC
  Administered 2015-04-05: 324 mg via ORAL
  Filled 2015-04-05: qty 4

## 2015-04-05 MED ORDER — ENOXAPARIN SODIUM 40 MG/0.4ML ~~LOC~~ SOLN
40.0000 mg | SUBCUTANEOUS | Status: DC
  Administered 2015-04-05 – 2015-04-07 (×3): 40 mg via SUBCUTANEOUS
  Filled 2015-04-05 (×3): qty 0.4

## 2015-04-05 MED ORDER — INSULIN ASPART 100 UNIT/ML ~~LOC~~ SOLN
0.0000 [IU] | Freq: Three times a day (TID) | SUBCUTANEOUS | Status: DC
  Administered 2015-04-07: 1 [IU] via SUBCUTANEOUS
  Administered 2015-04-08: 2 [IU] via SUBCUTANEOUS
  Administered 2015-04-08: 3 [IU] via SUBCUTANEOUS
  Administered 2015-04-08: 2 [IU] via SUBCUTANEOUS

## 2015-04-05 MED ORDER — LEVOTHYROXINE SODIUM 88 MCG PO TABS
88.0000 ug | ORAL_TABLET | Freq: Every day | ORAL | Status: DC
  Administered 2015-04-06 – 2015-04-08 (×3): 88 ug via ORAL
  Filled 2015-04-05 (×3): qty 1

## 2015-04-05 MED ORDER — PROCHLORPERAZINE EDISYLATE 5 MG/ML IJ SOLN
10.0000 mg | Freq: Four times a day (QID) | INTRAMUSCULAR | Status: DC | PRN
  Administered 2015-04-05 – 2015-04-07 (×4): 10 mg via INTRAVENOUS
  Filled 2015-04-05 (×4): qty 2

## 2015-04-05 MED ORDER — SENNOSIDES-DOCUSATE SODIUM 8.6-50 MG PO TABS: 1.0000 | ORAL_TABLET | Freq: Every evening | ORAL | Status: DC | PRN

## 2015-04-05 MED ORDER — STROKE: EARLY STAGES OF RECOVERY BOOK
Freq: Once | Status: AC
  Administered 2015-04-05
  Filled 2015-04-05: qty 1

## 2015-04-05 MED ORDER — ASPIRIN EC 81 MG PO TBEC
81.0000 mg | DELAYED_RELEASE_TABLET | Freq: Every day | ORAL | Status: DC
  Administered 2015-04-06: 81 mg via ORAL
  Filled 2015-04-05: qty 1

## 2015-04-05 MED ORDER — OXYCODONE-ACETAMINOPHEN 5-325 MG PO TABS
ORAL_TABLET | ORAL | Status: AC
  Administered 2015-04-05: 1
  Filled 2015-04-05: qty 1

## 2015-04-05 MED ORDER — HYDROMORPHONE HCL 1 MG/ML IJ SOLN
0.5000 mg | INTRAMUSCULAR | Status: DC | PRN
  Administered 2015-04-06: 0.5 mg via INTRAVENOUS
  Filled 2015-04-05: qty 1

## 2015-04-05 NOTE — ED Notes (Signed)
Patient c/o severe pressure in head with weakness in left arm and nausea x3 days. Patient also reports feeling dizzy and states "that she feels as if she will pass out when standing." Patient reports history of TIA.

## 2015-04-05 NOTE — ED Provider Notes (Signed)
[name]: [id number]     Arrival date & time 04/05/15  1723 History   First MD Initiated Contact with Patient 04/05/15 1725     Chief Complaint  Patient presents with  . Headache     (Consider location/radiation/quality/duration/timing/severity/associated sxs/prior Treatment) HPI 54 year old female with multiple past medical problems who presents today complaining of headache for 3 days. She states it began feeling like a migraine. She describes it is worsening. She describes it as feeling like a pebble that is rolling down the hill getting faster and faster and about to explode the top of her head off. Pain began gradually. She states that she has having some left upper extremity weakness. She states that she is lightheaded and having difficulty walking as she is passing out. She noted left hand weakness that began yesterday.  She denies leg weakness but has been having difficulty walking.  Past Medical History  Diagnosis Date  . Gastritis   . Fibromyalgia   . Edema of lower extremity   . DDD (degenerative disc disease)   . MS (multiple sclerosis)     early stages  . Neuropathy   . COPD (chronic obstructive pulmonary disease)   . Hiatal hernia   . Gout   . TIA (transient ischemic attack)     Hx: of  . Asthma     Albuterol prn   . Depression     takes Celexa,Clonazepam daily  . Muscle spasm     takes Flexeril daily as needed  . GERD (gastroesophageal reflux disease)     takes Pepcid daily  . CHF (congestive heart failure)     takes Furosemide daily  . Hypothyroidism     takes Synthroid daily  . HTN (hypertension)     takes lisinopril daily  . Hyperlipidemia     but doesn't take any meds  . Phlebitis   . Emphysema lung   . Shortness of breath     with exertion or stressed   . Pneumonia     hx of;last time in 2014  . History of bronchitis     last time 4-68months ago   . Migraine     has one now  . Migraine   . Stroke     left sided weakness  . Joint pain   . Joint  swelling   . Chronic back pain     DDD/scoliosis  . Osteoporosis   . Hemorrhoids   . History of colon polyps   . Urinary frequency   . History of kidney stones     is in her left kidney and has been for couple of yrs and no problems  . Diabetes mellitus     borderline  . Insomnia     doesn't take any meds for this  . Gout of big toe     RIGHT  . Complication of anesthesia     hard to wake up  . PONV (postoperative nausea and vomiting)   . Sleep apnea     sleep study done at least 31yrs ago;has CPAP but doesn't use  . Anxiety    Past Surgical History  Procedure Laterality Date  . Fundic gland polyp      benign  . Sigmoidoscopy  01/31/08    large internal hemorrhoids/small rectal polyp removed/rare sigmoid diverticula  . Esophagogastroduodenoscopy  10/29/07    normal  . Cholecystectomy    . Bladder suspension    . Abdominal hysterectomy    . Carpal tunnel release Right   .  Cardiac catheterization    . Colonoscopy    . Anterior cervical decomp/discectomy fusion N/A 12/30/2013    Procedure: ANTERIOR CERVICAL DISCECTOMY FUSION C4-5, C5-6, plate and screws, allograft, local bone graft;  Surgeon: Kerrin Champagne, MD;  Location: [facility];  Service: Orthopedics;  Laterality: N/A;  . Back surgery      lumbar discectomy  . Colonoscopy with propofol N/A 01/20/2015    Procedure: COLONOSCOPY WITH PROPOFOL;  Surgeon: West Bali, MD;  Location: [facility];  Service: Endoscopy;  Laterality: N/A;  In cecum @ 8:54, out @  9:15  . Esophageal biopsy N/A 01/20/2015    Procedure: COLON BIOPSY;  Surgeon: West Bali, MD;  Location: [facility];  Service: Endoscopy;  Laterality: N/A;  . Polypectomy N/A 01/20/2015    Procedure: RECTAL POLYPECTOMY;  Surgeon: West Bali, MD;  Location: [facility];  Service: Endoscopy;  Laterality: N/A;   Family History  Problem Relation Age of Onset  . Diabetes Mother   . Hypertension Mother   . Arthritis Father   . Diabetes Sister   . Neuropathy Sister   . Transient  ischemic attack Sister   . Heart disease Sister   . Hypertension Sister   . Cancer - Lung Other   . Cancer - Prostate Other   . Cancer - Other Other   . Colon cancer Paternal Uncle   . Colon cancer Brother     deceased age 80 from colon CA and brain CA   History  Substance Use Topics  . Smoking status: Current Some Day Smoker -- 0.50 packs/day for 15 years    Types: Cigarettes  . Smokeless tobacco: Never Used  . Alcohol Use: No   OB History    Gravida Para Term Preterm AB TAB SAB Ectopic Multiple Living   2 2 0 2      2     Review of Systems  All other systems reviewed and are negative.     Allergies  Demerol; Imitrex; Penicillins; Sulfa antibiotics; Levaquin; Neurontin; Toradol; Tramadol; and Doxycycline  Home Medications   Prior to Admission medications   Medication Sig Start Date End Date Taking? Authorizing Provider  albuterol (PROVENTIL HFA;VENTOLIN HFA) 108 (90 BASE) MCG/ACT inhaler Inhale 2 puffs into the lungs every 6 (six) hours as needed. Shortness of breath.   Yes Historical Provider, MD  ALPRAZolam Prudy Feeler) 1 MG tablet Take 1 mg by mouth 2 (two) times daily.    Yes Historical Provider, MD  citalopram (CELEXA) 40 MG tablet Take 40 mg by mouth daily.   Yes Historical Provider, MD  cyclobenzaprine (FLEXERIL) 10 MG tablet Take 10 mg by mouth 3 (three) times daily as needed for muscle spasms.   Yes Historical Provider, MD  famotidine (PEPCID) 20 MG tablet Take 1 tablet (20 mg total) by mouth daily as needed for heartburn. Patient taking differently: Take 40 mg by mouth daily.  11/20/14  Yes Erick Blinks, MD  Fluticasone Furoate-Vilanterol (BREO ELLIPTA) 100-25 MCG/INH AEPB Inhale 1 puff into the lungs daily.   Yes Historical Provider, MD  furosemide (LASIX) 20 MG tablet Take 20 mg by mouth daily.   Yes Historical Provider, MD  HYDROcodone-acetaminophen (NORCO) 10-325 MG per tablet Take 1 tablet by mouth every 4 (four) hours as needed for moderate pain or severe pain.  11/20/14  Yes Erick Blinks, MD  levothyroxine (SYNTHROID, LEVOTHROID) 88 MCG tablet Take 88 mcg by mouth daily before breakfast.   Yes Historical Provider, MD  lisinopril (PRINIVIL,ZESTRIL) 40  MG tablet Take 40 mg by mouth daily.   Yes Historical Provider, MD  dicyclomine (BENTYL) 20 MG tablet Take 1 tablet (20 mg total) by mouth 4 (four) times daily -  before meals and at bedtime. 01/20/15   West Bali, MD  ondansetron (ZOFRAN) 4 MG tablet Take 1 tablet (4 mg total) by mouth every 6 (six) hours as needed for nausea. 11/20/14   Erick Blinks, MD   BP 164/104 mmHg  Pulse 83  Temp(Src) 98 F (36.7 C) (Oral)  Resp 18  Ht  (1.676 m)  Wt 110 lb (49.896 kg)  BMI 17.76 kg/m2  SpO2 99% Physical Exam  Constitutional: She is oriented to person, place, and time. She appears well-developed and well-nourished.  HENT:  Head: Normocephalic and atraumatic.  Right Ear: External ear normal.  Left Ear: External ear normal.  Nose: Nose normal.  Mouth/Throat: Oropharynx is clear and moist.  Eyes: Conjunctivae and EOM are normal. Pupils are equal, round, and reactive to light.  Neck: Normal range of motion. Neck supple.  Cardiovascular: Normal rate, regular rhythm, normal heart sounds and intact distal pulses.   Pulmonary/Chest: Effort normal and breath sounds normal.  Abdominal: Soft. Bowel sounds are normal.  Musculoskeletal: Normal range of motion.  Neurological: She is alert and oriented to person, place, and time. She has normal reflexes. No cranial nerve deficit. Coordination normal.  Left palmar drift. Decreased strength intrinsic muscles left hand. Left hip flexion 4 out of 5.  Skin: Skin is warm and dry.  Psychiatric: She has a normal mood and affect. Her behavior is normal. Judgment and thought content normal.  Nursing note and vitals reviewed.   ED Course  Procedures (including critical care time) Labs Review Labs Reviewed  CBC WITH DIFFERENTIAL/PLATELET  COMPREHENSIVE  METABOLIC PANEL    Imaging Review Dg Chest 2 View  04/05/2015   CLINICAL DATA:  Weakness and arm pain  EXAM: CHEST  2 VIEW  COMPARISON:  04/16/2014  FINDINGS: The heart size and mediastinal contours are within normal limits. Both lungs are clear. The visualized skeletal structures are unremarkable. Postsurgical changes are noted in the cervical spine.  IMPRESSION: No active cardiopulmonary disease.   Electronically Signed   By: Alcide Clever M.D.   On: 04/05/2015 19:03   Ct Head Wo Contrast  04/05/2015   CLINICAL DATA:  Severe pressure sensation in the head with left arm weakness and nausea. Symptoms for 3 days.  EXAM: CT HEAD WITHOUT CONTRAST  TECHNIQUE: Contiguous axial images were obtained from the base of the skull through the vertex without intravenous contrast.  COMPARISON:  Head CT scan 05/16/2013.  FINDINGS: The brain appears normal without hemorrhage, infarct, mass lesion, mass effect, midline shift or abnormal extra-axial fluid collection. No hydrocephalus or pneumocephalus. The calvarium is intact. Imaged paranasal sinuses and mastoid air cells are clear.  IMPRESSION: Normal head CT.   Electronically Signed   By: Drusilla Kanner M.D.   On: 04/05/2015 18:46     EKG Interpretation   Date/Time:  Sunday Apr 05 2015 19:18:14 EDT Ventricular Rate:  73 PR Interval:  129 QRS Duration: 85 QT Interval:  476 QTC Calculation: 525 R Axis:   80 Text Interpretation:  Sinus rhythm nsst Prolonged QT interval No  significant change since last tracing Confirmed by Quillan Whitter MD, Duwayne Heck  [phone]) on 04/05/2015 7:23:32 PM      MDM   Final diagnoses:  Headache  Left-sided weakness   54 y.o. Female with headache and left  sided weakness.  No abnormality noted on head ct. However, patient with left palmar drift and left leg weakness c.w. Cva.  Patient with symptom onset greater than 24 hours.   Discussed with Dr. Conley Rolls and plan admission for stroke evaluation.   Margarita Grizzle, MD 04/05/15 [phone]

## 2015-04-05 NOTE — H&P (Signed)
[facility] History and Physical  [name] ZDG:[id number] DOB: 1961/05/27    PCP:   [name], MD   Chief Complaint: Headache and left sided weakness.   HPI: [name] is an 54 y.o. right handed female, with hx of MS along with frequent migraine, HTN, DM, hyperlipidemia, COPD, active tobacco use, fibromyalgia, sleep apnea, neuropathy, anxiety, gout, presented to the ER with HA for 2-3 weeks, and yesterday, with left sided weakness.  She denied any visual changes, slurred speech, facial droop, but admitted to feeling nauseated.  She had no fever, chills, vertigo, or any visual disturbances.  She stated that her HA is different than her usual "migraines" which she has been having about 3-4 times a month, not on any suppressive therapy.  She had no prior similar occurances.  She has 2 daughters with migraine as well. Evalaution in the ER included a head CT with no acute abnormality, and serology was unremarkable.  Her EKG showed NSR>   Rewiew of Systems:  Constitutional: Negative for malaise, fever and chills. No significant weight loss or weight gain Eyes: Negative for eye pain, redness and discharge, diplopia, visual changes, or flashes of light. ENMT: Negative for ear pain, hoarseness, nasal congestion, sinus pressure and sore throat. No headaches; tinnitus, drooling, or problem swallowing. Cardiovascular: Negative for chest pain, palpitations, diaphoresis, dyspnea and peripheral edema. ; No orthopnea, PND Respiratory: Negative for cough, hemoptysis, wheezing and stridor. No pleuritic chestpain. Gastrointestinal: Negative for nausea, vomiting, diarrhea, constipation, abdominal pain, melena, blood in stool, hematemesis, jaundice and rectal bleeding.    Genitourinary: Negative for frequency, dysuria, incontinence,flank pain and hematuria; Musculoskeletal: Negative for back pain and neck pain. Negative for swelling and trauma.;  Skin: . Negative for pruritus, rash, abrasions,  bruising and skin lesion.; ulcerations Neuro: Negative for headache, lightheadedness and neck stiffness. Negative for weakness, altered level of consciousness , altered mental status, extremity weakness, burning feet, involuntary movement, seizure and syncope.  Psych: negative for anxiety, depression, insomnia, tearfulness, panic attacks, hallucinations, paranoia, suicidal or homicidal ideation    Past Medical History  Diagnosis Date  . Gastritis   . Fibromyalgia   . Edema of lower extremity   . DDD (degenerative disc disease)   . MS (multiple sclerosis)     early stages  . Neuropathy   . COPD (chronic obstructive pulmonary disease)   . Hiatal hernia   . Gout   . TIA (transient ischemic attack)     Hx: of  . Asthma     Albuterol prn   . Depression     takes Celexa,Clonazepam daily  . Muscle spasm     takes Flexeril daily as needed  . GERD (gastroesophageal reflux disease)     takes Pepcid daily  . CHF (congestive heart failure)     takes Furosemide daily  . Hypothyroidism     takes Synthroid daily  . HTN (hypertension)     takes lisinopril daily  . Hyperlipidemia     but doesn't take any meds  . Phlebitis   . Emphysema lung   . Shortness of breath     with exertion or stressed   . Pneumonia     hx of;last time in 2014  . History of bronchitis     last time 4-49months ago   . Migraine     has one now  . Migraine   . Stroke     left sided weakness  . Joint pain   . Joint swelling   .  Chronic back pain     DDD/scoliosis  . Osteoporosis   . Hemorrhoids   . History of colon polyps   . Urinary frequency   . History of kidney stones     is in her left kidney and has been for couple of yrs and no problems  . Diabetes mellitus     borderline  . Insomnia     doesn't take any meds for this  . Gout of big toe     RIGHT  . Complication of anesthesia     hard to wake up  . PONV (postoperative nausea and vomiting)   . Sleep apnea     sleep study done at least 40yrs  ago;has CPAP but doesn't use  . Anxiety     Past Surgical History  Procedure Laterality Date  . Fundic gland polyp      benign  . Sigmoidoscopy  01/31/08    large internal hemorrhoids/small rectal polyp removed/rare sigmoid diverticula  . Esophagogastroduodenoscopy  10/29/07    normal  . Cholecystectomy    . Bladder suspension    . Abdominal hysterectomy    . Carpal tunnel release Right   . Cardiac catheterization    . Colonoscopy    . Anterior cervical decomp/discectomy fusion N/A 12/30/2013    Procedure: ANTERIOR CERVICAL DISCECTOMY FUSION C4-5, C5-6, plate and screws, allograft, local bone graft;  Surgeon: Kerrin Champagne, MD;  Location: [facility];  Service: Orthopedics;  Laterality: N/A;  . Back surgery      lumbar discectomy  . Colonoscopy with propofol N/A 01/20/2015    Procedure: COLONOSCOPY WITH PROPOFOL;  Surgeon: West Bali, MD;  Location: [facility];  Service: Endoscopy;  Laterality: N/A;  In cecum @ 8:54, out @  9:15  . Esophageal biopsy N/A 01/20/2015    Procedure: COLON BIOPSY;  Surgeon: West Bali, MD;  Location: [facility];  Service: Endoscopy;  Laterality: N/A;  . Polypectomy N/A 01/20/2015    Procedure: RECTAL POLYPECTOMY;  Surgeon: West Bali, MD;  Location: [facility];  Service: Endoscopy;  Laterality: N/A;    Medications:  HOME MEDS: Prior to Admission medications   Medication Sig Start Date End Date Taking? Authorizing Provider  albuterol (PROVENTIL HFA;VENTOLIN HFA) 108 (90 BASE) MCG/ACT inhaler Inhale 2 puffs into the lungs every 6 (six) hours as needed. Shortness of breath.   Yes Historical Provider, MD  ALPRAZolam Prudy Feeler) 1 MG tablet Take 1 mg by mouth 2 (two) times daily.    Yes Historical Provider, MD  citalopram (CELEXA) 40 MG tablet Take 40 mg by mouth daily.   Yes Historical Provider, MD  cyclobenzaprine (FLEXERIL) 10 MG tablet Take 10 mg by mouth 3 (three) times daily as needed for muscle spasms.   Yes Historical Provider, MD  famotidine (PEPCID) 20 MG  tablet Take 1 tablet (20 mg total) by mouth daily as needed for heartburn. Patient taking differently: Take 40 mg by mouth daily.  11/20/14  Yes Erick Blinks, MD  Fluticasone Furoate-Vilanterol (BREO ELLIPTA) 100-25 MCG/INH AEPB Inhale 1 puff into the lungs daily.   Yes Historical Provider, MD  furosemide (LASIX) 20 MG tablet Take 20 mg by mouth daily.   Yes Historical Provider, MD  HYDROcodone-acetaminophen (NORCO) 10-325 MG per tablet Take 1 tablet by mouth every 4 (four) hours as needed for moderate pain or severe pain. 11/20/14  Yes Erick Blinks, MD  levothyroxine (SYNTHROID, LEVOTHROID) 88 MCG tablet Take 88 mcg by mouth daily before breakfast.   Yes  Historical Provider, MD  lisinopril (PRINIVIL,ZESTRIL) 40 MG tablet Take 40 mg by mouth daily.   Yes Historical Provider, MD  dicyclomine (BENTYL) 20 MG tablet Take 1 tablet (20 mg total) by mouth 4 (four) times daily -  before meals and at bedtime. 01/20/15   West Bali, MD  ondansetron (ZOFRAN) 4 MG tablet Take 1 tablet (4 mg total) by mouth every 6 (six) hours as needed for nausea. 11/20/14   Erick Blinks, MD     Allergies:  Allergies  Allergen Reactions  . Demerol [Meperidine] Anaphylaxis and Swelling  . Imitrex [Sumatriptan] Anaphylaxis  . Penicillins Anaphylaxis and Swelling  . Sulfa Antibiotics Anaphylaxis  . Levaquin [Levofloxacin In D5w]     Abdominal pain  . Neurontin [Gabapentin] Other (See Comments)    Abdominal pain  . Toradol [Ketorolac Tromethamine] Other (See Comments)    "Feels like my whole body is on fire."  . Tramadol Other (See Comments)    Stomach upset   . Doxycycline Nausea And Vomiting         Social History:   reports that she has been smoking Cigarettes.  She has a 7.5 pack-year smoking history. She has never used smokeless tobacco. She reports that she does not drink alcohol or use illicit drugs.  Family History: Family History  Problem Relation Age of Onset  . Diabetes Mother   . Hypertension  Mother   . Arthritis Father   . Diabetes Sister   . Neuropathy Sister   . Transient ischemic attack Sister   . Heart disease Sister   . Hypertension Sister   . Cancer - Lung Other   . Cancer - Prostate Other   . Cancer - Other Other   . Colon cancer Paternal Uncle   . Colon cancer Brother     deceased age 39 from colon CA and brain CA     Physical Exam: Filed Vitals:   04/05/15 1800 04/05/15 1915 04/05/15 1930 04/05/15 2000  BP: 170/91  152/97 175/92  Pulse: 76  74   Temp:  98.4 F (36.9 C)    TempSrc:      Resp: Height:      Weight:      SpO2: 98%  98%    Blood pressure 175/92, pulse 74, temperature 98.4 F (36.9 C), temperature source Oral, resp. rate 13, height  (1.676 m), weight 49.896 kg (110 lb), SpO2 98 %.  GEN:  Pleasant  patient lying in the stretcher in no acute distress; cooperative with exam. PSYCH:  alert and oriented x4; does not appear anxious or depressed; affect is appropriate. HEENT: Mucous membranes pink and anicteric; PERRLA; EOM intact; no cervical lymphadenopathy nor thyromegaly or carotid bruit; no JVD; There were no stridor. Neck is very supple. Breasts:: Not examined CHEST WALL: No tenderness CHEST: Normal respiration, clear to auscultation bilaterally.  HEART: Regular rate and rhythm.  There are no murmur, rub, or gallops.   BACK: No kyphosis or scoliosis; no CVA tenderness ABDOMEN: soft and non-tender; no masses, no organomegaly, normal abdominal bowel sounds; no pannus; no intertriginous candida. There is no rebound and no distention. Rectal Exam: Not done EXTREMITIES: No bone or joint deformity; age-appropriate arthropathy of the hands and knees; no edema; no ulcerations.  There is no calf tenderness. Genitalia: not examined PULSES: 2+ and symmetric SKIN: Normal hydration no rash or ulceration CNS: Cranial nerves 2-12 grossly intact no focal lateralizing neurologic deficit.  Speech is fluent; uvula elevated  with phonation,  facial symmetry and tongue midline. DTR are normal bilaterally, cerebella exam is intact, barbinski is negative and strengths showed left sided clearly weaker than the right.  No sensory loss.   Labs on Admission:  Basic Metabolic Panel:  Recent Labs Lab 04/05/15 1815  NA 142  K 3.1*  CL 102  CO2 30  GLUCOSE 103*  BUN 9  CREATININE 0.81  CALCIUM 9.0   Liver Function Tests:  Recent Labs Lab 04/05/15 1815  AST 15  ALT 11*  ALKPHOS 65  BILITOT 0.4  PROT 6.8  ALBUMIN 4.0   No results for input(s): LIPASE, AMYLASE in the last 168 hours. No results for input(s): AMMONIA in the last 168 hours. CBC:  Recent Labs Lab 04/05/15 1815  WBC 5.3  NEUTROABS 2.8  HGB 12.6  HCT 37.7  MCV 91.3  PLT 241    Radiological Exams on Admission: Dg Chest 2 View  04/05/2015   CLINICAL DATA:  Weakness and arm pain  EXAM: CHEST  2 VIEW  COMPARISON:  04/16/2014  FINDINGS: The heart size and mediastinal contours are within normal limits. Both lungs are clear. The visualized skeletal structures are unremarkable. Postsurgical changes are noted in the cervical spine.  IMPRESSION: No active cardiopulmonary disease.   Electronically Signed   By: Alcide Clever M.D.   On: 04/05/2015 19:03   Ct Head Wo Contrast  04/05/2015   CLINICAL DATA:  Severe pressure sensation in the head with left arm weakness and nausea. Symptoms for 3 days.  EXAM: CT HEAD WITHOUT CONTRAST  TECHNIQUE: Contiguous axial images were obtained from the base of the skull through the vertex without intravenous contrast.  COMPARISON:  Head CT scan 05/16/2013.  FINDINGS: The brain appears normal without hemorrhage, infarct, mass lesion, mass effect, midline shift or abnormal extra-axial fluid collection. No hydrocephalus or pneumocephalus. The calvarium is intact. Imaged paranasal sinuses and mastoid air cells are clear.  IMPRESSION: Normal head CT.   Electronically Signed   By: Drusilla Kanner M.D.   On: 04/05/2015 18:46    EKG:  Independently reviewed.    Assessment/Plan Present on Admission:  . CVA (cerebral infarction) . Chronic pain syndrome . Cigarette smoker . COPD (chronic obstructive pulmonary disease) . Hypertension . Migraine . Acute CVA (cerebrovascular accident)  PLAN:  Will admit her for right hemispheric CVA vs complex migraine.  Given her hx of severe and frequent migraine, and her HA with left sided weakness, the complicated migraine is also in the differential.  If she did not have an acute CVA, then I think she should be started on suppressive therapy given she have had more than 3 HA s per month.  If she had a CVA, then MRI hopefully would show it, then a full stroke work up will be required.   Her BP is slightly elevated, but will give permissive HTN during this acute phase.  She was advised to quit cigarettes, but she is not quite ready to quit.  I don't think this is a presentation of her MS.  I have consulted neurology for further recommendation.  She is stable, full code, and will be admitted to telemetry.  She was given daily ASA.   Dr Gerda Diss, thank you for allowing me to partake in the care of your nice patient.   Other plans as per orders.  Code Status: FULL Unk Lightning, MD. [facility] Pager [phone] 7pm to 7am.  04/05/2015, 8:19 PM

## 2015-04-06 ENCOUNTER — Observation Stay (HOSPITAL_COMMUNITY): Payer: 59

## 2015-04-06 ENCOUNTER — Observation Stay (HOSPITAL_BASED_OUTPATIENT_CLINIC_OR_DEPARTMENT_OTHER): Payer: 59

## 2015-04-06 DIAGNOSIS — I639 Cerebral infarction, unspecified: Secondary | ICD-10-CM | POA: Diagnosis not present

## 2015-04-06 DIAGNOSIS — I6350 Cerebral infarction due to unspecified occlusion or stenosis of unspecified cerebral artery: Secondary | ICD-10-CM | POA: Diagnosis not present

## 2015-04-06 DIAGNOSIS — Z720 Tobacco use: Secondary | ICD-10-CM

## 2015-04-06 DIAGNOSIS — E119 Type 2 diabetes mellitus without complications: Secondary | ICD-10-CM | POA: Diagnosis not present

## 2015-04-06 DIAGNOSIS — E785 Hyperlipidemia, unspecified: Secondary | ICD-10-CM | POA: Diagnosis present

## 2015-04-06 LAB — GLUCOSE, CAPILLARY
GLUCOSE-CAPILLARY: 106 mg/dL — AB (ref 65–99)
Glucose-Capillary: 101 mg/dL — ABNORMAL HIGH (ref 65–99)
Glucose-Capillary: 96 mg/dL (ref 65–99)
Glucose-Capillary: 114 mg/dL — ABNORMAL HIGH (ref 65–99)

## 2015-04-06 LAB — LIPID PANEL
Cholesterol: 345 mg/dL — ABNORMAL HIGH (ref 0–200)
HDL: 35 mg/dL — AB (ref >40)
LDL Cholesterol: 261 mg/dL — ABNORMAL HIGH (ref 0–99)
TRIGLYCERIDES: 245 mg/dL — AB (ref <150)
Total CHOL/HDL Ratio: 9.9 RATIO
VLDL: 49 mg/dL — ABNORMAL HIGH (ref 0–40)

## 2015-04-06 LAB — RAPID URINE DRUG SCREEN, HOSP PERFORMED
AMPHETAMINES: NOT DETECTED
BARBITURATES: NOT DETECTED
Benzodiazepines: POSITIVE — AB
Cocaine: NOT DETECTED
Opiates: NOT DETECTED
Tetrahydrocannabinol: NOT DETECTED

## 2015-04-06 LAB — TSH: TSH: 62.349 u[IU]/mL — ABNORMAL HIGH (ref 0.350–4.500)

## 2015-04-06 MED ORDER — ATORVASTATIN CALCIUM 40 MG PO TABS
80.0000 mg | ORAL_TABLET | Freq: Every day | ORAL | Status: DC
  Administered 2015-04-06 – 2015-04-08 (×3): 80 mg via ORAL
  Filled 2015-04-06 (×3): qty 2

## 2015-04-06 MED ORDER — OXYCODONE-ACETAMINOPHEN 5-325 MG PO TABS
1.0000 | ORAL_TABLET | Freq: Four times a day (QID) | ORAL | Status: DC | PRN
  Administered 2015-04-06 – 2015-04-07 (×4): 2 via ORAL
  Administered 2015-04-07: 1 via ORAL
  Administered 2015-04-08 (×3): 2 via ORAL
  Filled 2015-04-06 (×8): qty 2

## 2015-04-06 MED ORDER — CLOPIDOGREL BISULFATE 75 MG PO TABS
75.0000 mg | ORAL_TABLET | Freq: Every day | ORAL | Status: DC
  Administered 2015-04-06 – 2015-04-08 (×3): 75 mg via ORAL
  Filled 2015-04-06 (×3): qty 1

## 2015-04-06 NOTE — Consult Note (Addendum)
Nichole A. Merlene Laughter, MD     [email or web address]          [name] is an 54 y.o. female.   ASSESSMENT/PLAN:  1. Subacute onset of severe headaches associated with weakness, numbness and pain of the left upper extremity. Statistically the most likely etiology is subacute/acute stroke. However, given her history of multiple sclerosis this is also high on the differential diagnosis. Migraine headaches is a other possibility but is much less likely. 2. Marked dyslipidemia. 3. Diabetes. 4. Hypertension. RECOMMENDATION: Aspirin or plavix.  Follow-up brain MRI/MRA. Echocardiography. Cry duplex elbow. Additional labs for homocysteine level, RPR, thyroid function tests, vitamin B12, C-reactive protein, sedimentation rate and HIV.  The patient is a 54 year old white female who has a baseline history of migraine headaches which occur episodically. She tells me that she has headaches about once or twice a week. The headaches last about 2 days. The patient's headaches are associated with squiggly lines usually traveling from the right to left and other visual disturbance. These are followed by severe bilateral pounding headaches. Headaches typically last about 1-2 days and associated with photophobia and nausea. The patient reports however that about 2 weeks ago she developed a nagging headache which was not associated with her typical visual disturbance. The headaches however became more more intense and unrelenting not responding to multiple over-the-counter medications. The headaches over the past 2 days of gone quite severe as severe as her baseline migraine headaches but again not responding to abortive care. She also has developed severe pain numbness and weakness of the left upper extremity and left leg. She'll she reports having left-sided neck pain and some chest pain. She denies shortness of breath. She denies GI GU symptoms other than nausea. The patient tells me that  she has a baseline history of multiple sclerosis. This was being worked up and followed at [facility]. She has not seen a neurologist for this about 8 years. She tells me that she was taken off medications by 8 years ago. Her symptoms are somewhat ill-defined however with the patient told that she had some generalized weakness and possibly some dizziness. She has not been on medications for about 8 years. She did have a spinal tap done as part of her workup at [facility]. The review of systems is currently otherwise negative.  GENERAL: This is a thin pleasant lady in no acute distress.  HEENT: Supple. Atraumatic normocephalic.   ABDOMEN: soft  EXTREMITIES: No edema   BACK: Normal.  SKIN: Normal by inspection.    MENTAL STATUS: Alert and oriented. Month and age are stated appropriately. Speech, language and cognition are generally intact. Judgment and insight normal.   CRANIAL NERVES: Pupils are equal, round and reactive to light and accommodation; extra ocular movements are full, there is no significant nystagmus; visual fields are full; upper and lower facial muscles are normal in strength and symmetric, there is mild flattening of the nasolabial fold-R; tongue is midline; uvula is midline; shoulder elevation is normal.  MOTOR: Normal tone, bulk and strength -R; downward drift of the left upper extremity. Mark drift left leg. Left deltoid and triceps 4/5. Left hip flexion 4/5 and dorsiflexion 3.  COORDINATION: Left finger to nose is normal, right finger to nose is normal, No rest tremor; no intention tremor; no postural tremor; no bradykinesia.  REFLEXES: Deep tendon reflexes are symmetrical and normal. Babinski reflexes are flexor bilaterally.   SENSATION: Significant reduced to light touch and temperature left upper and  left leg. The extinction to visual or tactile double simultaneous stimulation.  NIH stroke scale 6.   Blood pressure 145/67, pulse 80, temperature 98.6 F (37  C), temperature source Oral, resp. rate 18, height _0  (1.676 m), weight 54.2 kg (119 lb 7.8 oz), SpO2 100 %.  Past Medical History  Diagnosis Date  . Gastritis   . Fibromyalgia   . Edema of lower extremity   . DDD (degenerative disc disease)   . MS (multiple sclerosis)     early stages  . Neuropathy   . COPD (chronic obstructive pulmonary disease)   . Hiatal hernia   . Gout   . TIA (transient ischemic attack)     Hx: of  . Asthma     Albuterol prn   . Depression     takes Celexa,Clonazepam daily  . Muscle spasm     takes Flexeril daily as needed  . GERD (gastroesophageal reflux disease)     takes Pepcid daily  . CHF (congestive heart failure)     takes Furosemide daily  . Hypothyroidism     takes Synthroid daily  . HTN (hypertension)     takes lisinopril daily  . Hyperlipidemia     but doesn't take any meds  . Phlebitis   . Emphysema lung   . Shortness of breath     with exertion or stressed   . Pneumonia     hx of;last time in 2014  . History of bronchitis     last time 4-19month ago   . Migraine     has one now  . Migraine   . Stroke     left sided weakness  . Joint pain   . Joint swelling   . Chronic back pain     DDD/scoliosis  . Osteoporosis   . Hemorrhoids   . History of colon polyps   . Urinary frequency   . History of kidney stones     is in her left kidney and has been for couple of yrs and no problems  . Diabetes mellitus     borderline  . Insomnia     doesn't take any meds for this  . Gout of big toe     RIGHT  . Complication of anesthesia     hard to wake up  . PONV (postoperative nausea and vomiting)   . Sleep apnea     sleep study done at least 811yrago;has CPAP but doesn't use  . Anxiety     Past Surgical History  Procedure Laterality Date  . Fundic gland polyp      benign  . Sigmoidoscopy  01/31/08    large internal hemorrhoids/small rectal polyp removed/rare sigmoid diverticula  . Esophagogastroduodenoscopy  10/29/07     normal  . Cholecystectomy    . Bladder suspension    . Abdominal hysterectomy    . Carpal tunnel release Right   . Cardiac catheterization    . Colonoscopy    . Anterior cervical decomp/discectomy fusion N/A 12/30/2013    Procedure: ANTERIOR CERVICAL DISCECTOMY FUSION C4-5, C5-6, plate and screws, allograft, local bone graft;  Surgeon: JaJessy OtoMD;  [facility] Service: Orthopedics;  Laterality: N/A;  . Back surgery      lumbar discectomy  . Colonoscopy with propofol N/A 01/20/2015    Procedure: COLONOSCOPY WITH PROPOFOL;  Surgeon: SaDanie BinderMD;  Location: [facility];  Service: Endoscopy;  Laterality: N/A;  In cecum @ 8:54, out @  9:15  . Esophageal biopsy N/A 01/20/2015    Procedure: COLON BIOPSY;  Surgeon: Danie Binder, MD;  Location: [facility];  Service: Endoscopy;  Laterality: N/A;  . Polypectomy N/A 01/20/2015    Procedure: RECTAL POLYPECTOMY;  Surgeon: Danie Binder, MD;  Location: [facility];  Service: Endoscopy;  Laterality: N/A;    Family History  Problem Relation Age of Onset  . Diabetes Mother   . Hypertension Mother   . Arthritis Father   . Diabetes Sister   . Neuropathy Sister   . Transient ischemic attack Sister   . Heart disease Sister   . Hypertension Sister   . Cancer - Lung Other   . Cancer - Prostate Other   . Cancer - Other Other   . Colon cancer Paternal Uncle   . Colon cancer Brother     deceased age 97 from colon CA and brain CA    Social History:  reports that she has been smoking Cigarettes.  She has a 7.5 pack-year smoking history. She has never used smokeless tobacco. She reports that she does not drink alcohol or use illicit drugs.  Allergies:  Allergies  Allergen Reactions  . Demerol [Meperidine] Anaphylaxis and Swelling  . Imitrex [Sumatriptan] Anaphylaxis  . Penicillins Anaphylaxis and Swelling  . Sulfa Antibiotics Anaphylaxis  . Levaquin [Levofloxacin In D5w]     Abdominal pain  . Neurontin [Gabapentin] Other (See Comments)     Abdominal pain  . Toradol [Ketorolac Tromethamine] Other (See Comments)    "Feels like my whole body is on fire."  . Tramadol Other (See Comments)    Stomach upset   . Doxycycline Nausea And Vomiting         Medications: Prior to Admission medications   Medication Sig Start Date End Date Taking? Authorizing Provider  albuterol (PROVENTIL HFA;VENTOLIN HFA) 108 (90 BASE) MCG/ACT inhaler Inhale 2 puffs into the lungs every 6 (six) hours as needed. Shortness of breath.   Yes Historical Provider, MD  ALPRAZolam Duanne Moron) 1 MG tablet Take 1 mg by mouth 2 (two) times daily.    Yes Historical Provider, MD  citalopram (CELEXA) 40 MG tablet Take 40 mg by mouth daily.   Yes Historical Provider, MD  cyclobenzaprine (FLEXERIL) 10 MG tablet Take 10 mg by mouth 3 (three) times daily as needed for muscle spasms.   Yes Historical Provider, MD  famotidine (PEPCID) 20 MG tablet Take 1 tablet (20 mg total) by mouth daily as needed for heartburn. Patient taking differently: Take 40 mg by mouth daily.  11/20/14  Yes Kathie Dike, MD  Fluticasone Furoate-Vilanterol (BREO ELLIPTA) 100-25 MCG/INH AEPB Inhale 1 puff into the lungs daily.   Yes Historical Provider, MD  furosemide (LASIX) 20 MG tablet Take 20 mg by mouth daily.   Yes Historical Provider, MD  HYDROcodone-acetaminophen (NORCO) 10-325 MG per tablet Take 1 tablet by mouth every 4 (four) hours as needed for moderate pain or severe pain. 11/20/14  Yes Kathie Dike, MD  levothyroxine (SYNTHROID, LEVOTHROID) 88 MCG tablet Take 88 mcg by mouth daily before breakfast.   Yes Historical Provider, MD  lisinopril (PRINIVIL,ZESTRIL) 40 MG tablet Take 40 mg by mouth daily.   Yes Historical Provider, MD  dicyclomine (BENTYL) 20 MG tablet Take 1 tablet (20 mg total) by mouth 4 (four) times daily -  before meals and at bedtime. 01/20/15   Danie Binder, MD  ondansetron (ZOFRAN) 4 MG tablet Take 1 tablet (4 mg total) by mouth every 6 (six)  hours as needed for nausea.  11/20/14   Kathie Dike, MD    Scheduled Meds: . ALPRAZolam  1 mg Oral BID  . atorvastatin  80 mg Oral q1800  . citalopram  40 mg Oral Daily  . clopidogrel  75 mg Oral Daily  . enoxaparin (LOVENOX) injection  40 mg Subcutaneous Q24H  . famotidine  40 mg Oral Daily  . insulin aspart  0-5 Units Subcutaneous QHS  . insulin aspart  0-9 Units Subcutaneous TID WC  . levothyroxine  88 mcg Oral QAC breakfast   Continuous Infusions:  PRN Meds:.albuterol, oxyCODONE-acetaminophen, prochlorperazine, promethazine, senna-docusate     Results for orders placed or performed during the hospital encounter of 04/05/15 (from the past 48 hour(s))  CBC with Differential/Platelet     Status: None   Collection Time: 04/05/15  6:15 PM  Result Value Ref Range   WBC 5.3 4.0 - 10.5 K/uL   RBC 4.13 3.87 - 5.11 MIL/uL   Hemoglobin 12.6 12.0 - 15.0 g/dL   HCT 37.7 36.0 - 46.0 %   MCV 91.3 78.0 - 100.0 fL   MCH 30.5 26.0 - 34.0 pg   MCHC 33.4 30.0 - 36.0 g/dL   RDW 15.0 11.5 - 15.5 %   Platelets 241 150 - 400 K/uL   Neutrophils Relative % 52 43 - 77 %   Neutro Abs 2.8 1.7 - 7.7 K/uL   Lymphocytes Relative 33 12 - 46 %   Lymphs Abs 1.7 0.7 - 4.0 K/uL   Monocytes Relative 9 3 - 12 %   Monocytes Absolute 0.5 0.1 - 1.0 K/uL   Eosinophils Relative 5 0 - 5 %   Eosinophils Absolute 0.3 0.0 - 0.7 K/uL   Basophils Relative 1 0 - 1 %   Basophils Absolute 0.1 0.0 - 0.1 K/uL  Comprehensive metabolic panel     Status: Abnormal   Collection Time: 04/05/15  6:15 PM  Result Value Ref Range   Sodium 142 135 - 145 mmol/L   Potassium 3.1 (L) 3.5 - 5.1 mmol/L   Chloride 102 101 - 111 mmol/L   CO2 30 22 - 32 mmol/L   Glucose, Bld 103 (H) 65 - 99 mg/dL   BUN 9 6 - 20 mg/dL   Creatinine, Ser 0.81 0.44 - 1.00 mg/dL   Calcium 9.0 8.9 - 10.3 mg/dL   Total Protein 6.8 6.5 - 8.1 g/dL   Albumin 4.0 3.5 - 5.0 g/dL   AST 15 15 - 41 U/L   ALT 11 (L) 14 - 54 U/L   Alkaline Phosphatase 65 38 - 126 U/L   Total Bilirubin  0.4 0.3 - 1.2 mg/dL   GFR calc non Af Amer >60 >60 mL/min   GFR calc Af Amer >60 >60 mL/min    Comment: (NOTE) The eGFR has been calculated using the CKD EPI equation. This calculation has not been validated in all clinical situations. eGFR's persistently <60 mL/min signify possible Chronic Kidney Disease.    Anion gap 10 5 - 15  Glucose, capillary     Status: Abnormal   Collection Time: 04/05/15 11:00 PM  Result Value Ref Range   Glucose-Capillary 117 (H) 65 - 99 mg/dL   Comment 1 Notify RN   Urine rapid drug screen (hosp performed)     Status: Abnormal   Collection Time: 04/05/15 11:50 PM  Result Value Ref Range   Opiates NONE DETECTED NONE DETECTED   Cocaine NONE DETECTED NONE DETECTED   Benzodiazepines POSITIVE (A) NONE  DETECTED   Amphetamines NONE DETECTED NONE DETECTED   Tetrahydrocannabinol NONE DETECTED NONE DETECTED   Barbiturates NONE DETECTED NONE DETECTED    Comment:        DRUG SCREEN FOR MEDICAL PURPOSES ONLY.  IF CONFIRMATION IS NEEDED FOR ANY PURPOSE, NOTIFY LAB WITHIN 5 DAYS.        LOWEST DETECTABLE LIMITS FOR URINE DRUG SCREEN Drug Class       Cutoff (ng/mL) Amphetamine      1000 Barbiturate      200 Benzodiazepine   250 Tricyclics       539 Opiates          300 Cocaine          300 THC              50   Fasting lipid panel     Status: Abnormal   Collection Time: 04/06/15  7:14 AM  Result Value Ref Range   Cholesterol 345 (H) 0 - 200 mg/dL   Triglycerides 245 (H) <150 mg/dL   HDL 35 (L) >40 mg/dL   Total CHOL/HDL Ratio 9.9 RATIO   VLDL 49 (H) 0 - 40 mg/dL   LDL Cholesterol 261 (H) 0 - 99 mg/dL    Comment:        Total Cholesterol/HDL:CHD Risk Coronary Heart Disease Risk Table                     Men   Women  1/2 Average Risk   3.4   3.3  Average Risk       5.0   4.4  2 X Average Risk   9.6   7.1  3 X Average Risk  23.4   11.0        Use the calculated Patient Ratio above and the CHD Risk Table to determine the patient's CHD Risk.          ATP III CLASSIFICATION (LDL):  <100     mg/dL   Optimal  100-129  mg/dL   Near or Above                    Optimal  130-159  mg/dL   Borderline  160-189  mg/dL   High  >190     mg/dL   Very High   Glucose, capillary     Status: Abnormal   Collection Time: 04/06/15  7:47 AM  Result Value Ref Range   Glucose-Capillary 101 (H) 65 - 99 mg/dL   Comment 1 Notify RN   Glucose, capillary     Status: None   Collection Time: 04/06/15 12:07 PM  Result Value Ref Range   Glucose-Capillary 96 65 - 99 mg/dL  Glucose, capillary     Status: Abnormal   Collection Time: 04/06/15  6:22 PM  Result Value Ref Range   Glucose-Capillary 106 (H) 65 - 99 mg/dL  Glucose, capillary     Status: Abnormal   Collection Time: 04/06/15 10:33 PM  Result Value Ref Range   Glucose-Capillary 114 (H) 65 - 99 mg/dL    Studies/Results:     Qusay Villada A. Merlene [name], M.D.  Diplomate, Tax adviser of Psychiatry and Neurology ( Neurology). 04/06/2015, 11:03 PM

## 2015-04-06 NOTE — Evaluation (Signed)
Physical Therapy Evaluation Patient Details Name: [name] MRN: [id number] DOB: 1961/09/02 Today's Date: 04/06/2015   History of Present Illness  [name] is an 54 y.o. right handed female, with hx of MS along with frequent migraine, HTN, DM, hyperlipidemia, COPD, active tobacco use, fibromyalgia, sleep apnea, neuropathy, anxiety, gout, presented to the ER with HA for 2-3 weeks, and yesterday, with left sided weakness. She denied any visual changes, slurred speech, facial droop, but admitted to feeling nauseated. She had no fever, chills, vertigo, or any visual disturbances. She stated that her HA is different than her usual "migraines" which she has been having about 3-4 times a month, not on any suppressive therapy. She had no prior similar occurances. She has 2 daughters with migraine as well. Evalaution in the ER included a head CT with no acute abnormality, and serology was unremarkable.   Clinical Impression  Pt was seen for evaluation.  She has been dealing with a severe HA this morning but now feels able to work with me.  She is alert and oriented and states that she lives independently with her family.  She states that her MS is in remission.  She is s/p fusions in cervical and lumbar vertebrae in 2014.  Her left extremities are found to be in the 3/5 to 3+/5 range, right extremities generally 4/5.  She has a mild peripheral neuropathy in the feet.  Her standing balance is mildly impaired and she now needs a walker to stabilize gait.  I am recommending OP PT at d/c as this is her preference.    Follow Up Recommendations Outpatient PT (per pt request)    Equipment Recommendations  None recommended by PT    Recommendations for Other Services OT consult     Precautions / Restrictions Precautions Precautions: Fall Restrictions Weight Bearing Restrictions: No      Mobility  Bed Mobility Overal bed mobility: Modified Independent                 Transfers Overall transfer level: Modified independent Equipment used: None                Ambulation/Gait Ambulation/Gait assistance: Min guard Ambulation Distance (Feet): 30 Feet Assistive device: Rolling walker (2 wheeled) Gait Pattern/deviations: Decreased weight shift to left Gait velocity: hyperextends left knee during stance Gait velocity interpretation: at or above normal speed for age/gender General Gait Details: standing balance is decreased, tends to fall backward  Stairs                     Balance Overall balance assessment: Needs assistance Sitting-balance support: No upper extremity supported;Feet supported Sitting balance-Leahy Scale: Good     Standing balance support: No upper extremity supported Standing balance-Leahy Scale: Fair                               Pertinent Vitals/Pain Pain Assessment: 0-10 Pain Score: 7  Pain Location: headache Pain Descriptors / Indicators: Headache Pain Intervention(s): Limited activity within patient's tolerance;Premedicated before session    Home Living Family/patient expects to be discharged to:: Private residence Living Arrangements: Spouse/significant other Available Help at Discharge: Family;Available 24 hours/day Type of Home: House Home Access: Stairs to enter Entrance Stairs-Rails: Left Entrance Stairs-Number of Steps: 4 Home Layout: One level Home Equipment: Walker - 2 wheels;Cane - single point;Shower seat      Prior Function Level of Independence: Independent  Hand Dominance   Dominant Hand: Right    Extremity/Trunk Assessment   Upper Extremity Assessment: Defer to OT evaluation       LUE Deficits / Details: brief screen revealed all musculature intact with 3+/5 strength   Lower Extremity Assessment: LLE deficits/detail   LLE Deficits / Details: strength generally 3/5 in the left leg, no abnormal tone noted  Cervical / Trunk Assessment:  Kyphotic  Communication   Communication: No difficulties  Cognition Arousal/Alertness: Awake/alert Behavior During Therapy: WFL for tasks assessed/performed Overall Cognitive Status: Within Functional Limits for tasks assessed                                    Assessment/Plan    PT Assessment Patient needs continued PT services  PT Diagnosis Hemiplegia non-dominant side   PT Problem List Decreased strength;Decreased activity tolerance;Decreased balance;Decreased mobility;Impaired sensation  PT Treatment Interventions Gait training;Functional mobility training;Therapeutic exercise;Stair training;Balance training   PT Goals (Current goals can be found in the Care Plan section) Acute Rehab PT Goals Patient Stated Goal: return home PT Goal Formulation: With patient Time For Goal Achievement: 04/20/15 Potential to Achieve Goals: Good    Frequency Min 5X/week   Barriers to discharge   none                   End of Session Equipment Utilized During Treatment: Gait belt Activity Tolerance: Patient limited by fatigue Patient left: in bed;with call bell/phone within reach Nurse Communication: Mobility status    Functional Assessment Tool Used: clinical judgement Functional Limitation: Mobility: Walking and moving around Mobility: Walking and Moving Around Current Status (W0981): At least 20 percent but less than 40 percent impaired, limited or restricted Mobility: Walking and Moving Around Goal Status [phone]): At least 1 percent but less than 20 percent impaired, limited or restricted    Time: 0936-1001 PT Time Calculation (min) (ACUTE ONLY): 25 min   Charges:   PT Evaluation $Initial PT Evaluation Tier I: 1 Procedure     PT G Codes:   PT G-Codes **NOT FOR INPATIENT CLASS** Functional Assessment Tool Used: clinical judgement Functional Limitation: Mobility: Walking and moving around Mobility: Walking and Moving Around Current Status (W2956): At least  20 percent but less than 40 percent impaired, limited or restricted Mobility: Walking and Moving Around Goal Status [phone]): At least 1 percent but less than 20 percent impaired, limited or restricted    Konrad Penta  PT 04/06/2015, 10:16 AM [phone]

## 2015-04-06 NOTE — Progress Notes (Signed)
1848 New orders given per Dr.Goodrich as follows: 1.) d/c Norco 10-325mg  order 2.) Percocet 5-325mg  PO 1-2 TABS Q6H PRN for pain  Patient aware.

## 2015-04-06 NOTE — Progress Notes (Signed)
PROGRESS NOTE  SHERON KUNIN ZOX:[id number] DOB: January 22, 1961 DOA: 04/05/2015 PCP: [name], MD  Summary: 54 year old woman with history of frequent migraine headache, multiple sclerosis, presented to the emergency department with several week history of headache and left-sided weakness for 1-2 days. EDP noted left hand and left leg weakness. She was admitted for left-sided weakness and further evaluation including stroke evaluation. Complicated migraine was also considered.  Assessment/Plan: 1. Suspected acute stroke, left hand weakness, left lower extremity weakness present on admission for 24 more hours prior to admission, not a candidate for TPA. LDL 261. 2. Lightheaded. No syncope. Likely secondary to above.  3. Hyperlipidemia. Start statin. 4. Multiple sclerosis 5. Frequent migraines. 6. Fibromyalgia 7. Tobacco dependence 8. Hypothyroidism  9. Borderline diabetes mellitus. HgbA1c pending.   Stable, highly suspicious for stroke. No MRI today with holiday. Will plan for full stroke w/u next 24 hours.  Was on ASA, will change to Plavix  Start statin  Neurology consult pending  Stop smoking  Code Status: full code DVT prophylaxis: Lovenox Family Communication: none present. Patient alert, understands plan. Disposition Plan: home  Brendia Sacks, MD  [facility]  Pager [phone] If 7PM-7AM, please contact night-coverage at [email or web address], password [facility] 04/06/2015, 11:15 AM    Consultants:  Physical therapy: Outpatient PT. Left-sided weakness noted.  Procedures:    Antibiotics:    HPI/Subjective: Feels ok, still has a posterior headache present for weeks. LUE and LLE weak. No difficulty speaking or swallowing.   Objective: Filed Vitals:   04/06/15 0400 04/06/15 0655 04/06/15 0800 04/06/15 0930  BP: 142/76 148/69 132/68 129/64  Pulse:  76 72 78  Temp: 98.2 F (36.8 C) 98.2 F (36.8 C) 98.3 F (36.8 C) 98 F (36.7 C)  TempSrc: Oral Oral Oral Oral   Resp: 20 20 19 20   Height:      Weight:      SpO2: 98% 100% 97% 96%    Intake/Output Summary (Last 24 hours) at 04/06/15 1115 Last data filed at 04/06/15 1102  Gross per 24 hour  Intake      0 ml  Output    800 ml  Net   -800 ml     Filed Weights   04/05/15 1735 04/05/15 2148  Weight: 49.896 kg (110 lb) 54.2 kg (119 lb 7.8 oz)    Exam:     Afebrile, vital signs stable, no hypoxia General:  Appears calm and comfortable Eyes: PERRL, normal lids, irises  ENT: grossly normal hearing, lips & tongue Cardiovascular: RRR, no m/r/g. No LE edema. Telemetry: SR, no arrhythmias  Respiratory: CTA bilaterally, no w/r/r. Normal respiratory effort. Musculoskeletal: grossly normal tone BUE/BLE. RUE/RLE strength 4/5, LUE/LLE 3+/5. Able to lift arm up but weak especially at shoulder. Difficulty lifting left leg up. Psychiatric: grossly normal mood and affect, speech fluent and appropriate Neurologic: CN grossly intact. Left pronator drift. No pass pointing.  Data reviewed:  Blood sugars stable  Potassium 3.1 on admission, basic metabolic panel otherwise unremarkable  Total cholesterol 345, LDL 261  CBC unremarkable  Urine drug screen positive for benzodiazepines  Chest x-ray no acute disease  CT head no acute disease  EKG normal sinus rhythm, nonspecific ST changes, compared to previous study 01/14/2015, no acute changes seen.  Scheduled Meds: . ALPRAZolam  1 mg Oral BID  . aspirin EC  81 mg Oral Daily  . citalopram  40 mg Oral Daily  . enoxaparin (LOVENOX) injection  40 mg Subcutaneous Q24H  . famotidine  40 mg  Oral Daily  . insulin aspart  0-5 Units Subcutaneous QHS  . insulin aspart  0-9 Units Subcutaneous TID WC  . levothyroxine  88 mcg Oral QAC breakfast   Continuous Infusions:   Principal Problem:   Acute CVA (cerebrovascular accident) Active Problems:   DM (diabetes mellitus)   Hypertension   COPD (chronic obstructive pulmonary disease)   Chronic pain  syndrome   Cigarette smoker   Migraine   Hyperlipidemia   Time spent 25 minutes

## 2015-04-06 NOTE — Progress Notes (Signed)
1846 Patient requesting something stronger for her headache pain, reported Norco isn't as effective. MD notified.

## 2015-04-07 ENCOUNTER — Observation Stay (HOSPITAL_COMMUNITY): Payer: 59

## 2015-04-07 DIAGNOSIS — G43809 Other migraine, not intractable, without status migrainosus: Secondary | ICD-10-CM

## 2015-04-07 DIAGNOSIS — E119 Type 2 diabetes mellitus without complications: Secondary | ICD-10-CM | POA: Diagnosis not present

## 2015-04-07 DIAGNOSIS — G894 Chronic pain syndrome: Secondary | ICD-10-CM | POA: Diagnosis not present

## 2015-04-07 LAB — SEDIMENTATION RATE: Sed Rate: 10 mm/hr (ref 0–22)

## 2015-04-07 LAB — HEMOGLOBIN A1C
Hgb A1c MFr Bld: 5.9 % — ABNORMAL HIGH (ref 4.8–5.6)
Mean Plasma Glucose: 123 mg/dL

## 2015-04-07 LAB — C-REACTIVE PROTEIN: CRP: <0.5 mg/dL

## 2015-04-07 LAB — GLUCOSE, CAPILLARY
Glucose-Capillary: 98 mg/dL (ref 65–99)
Glucose-Capillary: 129 mg/dL — ABNORMAL HIGH (ref 65–99)
Glucose-Capillary: 102 mg/dL — ABNORMAL HIGH (ref 65–99)
Glucose-Capillary: 105 mg/dL — ABNORMAL HIGH (ref 65–99)

## 2015-04-07 LAB — VITAMIN B12: Vitamin B-12: 220 pg/mL (ref 180–914)

## 2015-04-07 MED ORDER — METHYLPREDNISOLONE SODIUM SUCC 1000 MG IJ SOLR
INTRAMUSCULAR | Status: AC
  Filled 2015-04-07: qty 8

## 2015-04-07 MED ORDER — SODIUM CHLORIDE 0.9 % IV SOLN
1000.0000 mg | INTRAVENOUS | Status: DC
  Administered 2015-04-08 (×2): 1000 mg via INTRAVENOUS
  Filled 2015-04-07 (×2): qty 8

## 2015-04-07 MED ORDER — POTASSIUM CHLORIDE 10 MEQ/100ML IV SOLN
10.0000 meq | INTRAVENOUS | Status: AC
  Administered 2015-04-07 (×3): 10 meq via INTRAVENOUS
  Filled 2015-04-07: qty 100

## 2015-04-07 MED ORDER — BUTALBITAL-APAP-CAFFEINE 50-325-40 MG PO TABS: 2.0000 | ORAL_TABLET | ORAL | Status: DC | PRN

## 2015-04-07 MED ORDER — CYANOCOBALAMIN 1000 MCG/ML IJ SOLN
1000.0000 ug | Freq: Once | INTRAMUSCULAR | Status: AC
  Administered 2015-04-07: 1000 ug via INTRAMUSCULAR
  Filled 2015-04-07: qty 1

## 2015-04-07 NOTE — Progress Notes (Signed)
[facility] PROGRESS NOTE  [name] BJY:[id number] DOB: 1960-12-16 DOA: 04/05/2015 PCP: [name], MD  Assessment/Plan: 1. Left-sided weakness- MRIs negative for acute stroke, she has a history of multiple sclerosis. Neurology to follow the patient, patient was on aspirin at home and was changed to Plavix yesterday. 2. Hyperlipidemia- continue statin 3. Borderline diabetes mellitus- hemoglobin A1c 5.9 4. Hypokalemia- replace potassium and check BMP in a.m.  Code Status: Full code Family Communication: *No family at bedside Disposition Plan: Home when stable   Consultants:  Neurology  Procedures:  None  Antibiotics:  None  HPI/Subjective: 54 year old woman with history of frequent migraine headache, multiple sclerosis, presented to the emergency department with several week history of headache and left-sided weakness for 1-2 days. EDP noted left hand and left leg weakness. She was admitted for left-sided weakness and further evaluation including stroke evaluation. Complicated migraine was also considered  Objective: Filed Vitals:   04/07/15 1424  BP: 148/76  Pulse: 79  Temp: 97.7 F (36.5 C)  Resp: 18    Intake/Output Summary (Last 24 hours) at 04/07/15 1542 Last data filed at 04/07/15 1425  Gross per 24 hour  Intake   1370 ml  Output   2625 ml  Net  -1255 ml   Filed Weights   04/05/15 1735 04/05/15 2148  Weight: 49.896 kg (110 lb) 54.2 kg (119 lb 7.8 oz)    Exam:   General:  Appears in no acute distress  Cardiovascular: S1-S2 regular  Respiratory: Clear to auscultation bilaterally  Abdomen: Soft, nontender, no organomegaly  Neurological- left hand weakness. Mild left lower extremity weakness   Data Reviewed: Basic Metabolic Panel:  Recent Labs Lab 04/05/15 1815  NA 142  K 3.1*  CL 102  CO2 30  GLUCOSE 103*  BUN 9  CREATININE 0.81  CALCIUM 9.0   Liver Function Tests:  Recent Labs Lab 04/05/15 1815  AST 15  ALT 11*   ALKPHOS 65  BILITOT 0.4  PROT 6.8  ALBUMIN 4.0   No results for input(s): LIPASE, AMYLASE in the last 168 hours. No results for input(s): AMMONIA in the last 168 hours. CBC:  Recent Labs Lab 04/05/15 1815  WBC 5.3  NEUTROABS 2.8  HGB 12.6  HCT 37.7  MCV 91.3  PLT 241   Cardiac Enzymes: No results for input(s): CKTOTAL, CKMB, CKMBINDEX, TROPONINI in the last 168 hours. BNP (last 3 results) No results for input(s): BNP in the last 8760 hours.  ProBNP (last 3 results) No results for input(s): PROBNP in the last 8760 hours.  CBG:  Recent Labs Lab 04/06/15 1207 04/06/15 1822 04/06/15 2233 04/07/15 0740 04/07/15 1138  GLUCAP 96 106* 114* 98 129*    No results found for this or any previous visit (from the past 240 hour(s)).   Studies: Dg Chest 2 View  04/05/2015   CLINICAL DATA:  Weakness and arm pain  EXAM: CHEST  2 VIEW  COMPARISON:  04/16/2014  FINDINGS: The heart size and mediastinal contours are within normal limits. Both lungs are clear. The visualized skeletal structures are unremarkable. Postsurgical changes are noted in the cervical spine.  IMPRESSION: No active cardiopulmonary disease.   Electronically Signed   By: Alcide Clever M.D.   On: 04/05/2015 19:03   Ct Head Wo Contrast  04/05/2015   CLINICAL DATA:  Severe pressure sensation in the head with left arm weakness and nausea. Symptoms for 3 days.  EXAM: CT HEAD WITHOUT CONTRAST  TECHNIQUE: Contiguous axial images were obtained  from the base of the skull through the vertex without intravenous contrast.  COMPARISON:  Head CT scan 05/16/2013.  FINDINGS: The brain appears normal without hemorrhage, infarct, mass lesion, mass effect, midline shift or abnormal extra-axial fluid collection. No hydrocephalus or pneumocephalus. The calvarium is intact. Imaged paranasal sinuses and mastoid air cells are clear.  IMPRESSION: Normal head CT.   Electronically Signed   By: Drusilla Kanner M.D.   On: 04/05/2015 18:46   Mri  Brain Without Contrast  04/07/2015   CLINICAL DATA:  54 year old diabetic hypertensive female with hyperlipidemia and history of multiple sclerosis and migraine headaches. Presenting with headache and left-sided weakness with numbness. Subsequent encounter.  EXAM: MRI HEAD WITHOUT CONTRAST  MRA HEAD WITHOUT CONTRAST  TECHNIQUE: Multiplanar, multiecho pulse sequences of the brain and surrounding structures were obtained without intravenous contrast. Angiographic images of the head were obtained using MRA technique without contrast.  COMPARISON:  04/05/2015 head CT. 01/14/2009 brain MR and MR angiogram circle of Willis.  FINDINGS: MRI HEAD FINDINGS  No acute infarct.  No intracranial hemorrhage.  Scattered punctate nonspecific white matter type changes typically seen as a result of small vessel disease in a patient of this age with diabetes and hypertension. The appearance is not typical for diagnostic of multiple sclerosis which cannot be entirely excluded but felt to be less likely consideration.  No hydrocephalus.  Major intracranial vascular structures are patent.  No intracranial mass lesion noted on this unenhanced exam.  Cervical medullary junction, pituitary region, pineal region and orbital structures unremarkable.  MRA HEAD FINDINGS  Cavernous segment internal carotid artery ectasia with mild narrowing and irregularity.  Mild narrowing internal carotid artery supraclinoid segment more notable on the right.  Tiny bulge distal A1 segment right anterior cerebral artery may represent origin of a vessel or result of motion artifact although tiny 1.1 mm aneurysm cannot be entirely excluded. Is difficult to determine if this was present previously as the present exam is of higher quality.  Left vertebral artery is dominant. No high-grade stenosis of the vertebral arteries.  Nonvisualized left anterior inferior cerebellar artery.  Moderate narrowing origin of the right superior cerebellar artery.  IMPRESSION: MRI  HEAD  No acute infarct.  Scattered punctate nonspecific white matter type changes typically seen as a result of small vessel disease in a patient of this age with diabetes and hypertension. The appearance is not typical for diagnostic of multiple sclerosis.  MRA HEAD  Cavernous segment internal carotid artery ectasia with mild narrowing and irregularity.  Mild narrowing internal carotid artery supraclinoid segment more notable on the right.  Tiny bulge distal A1 segment right anterior cerebral artery may represent origin of a vessel or result of motion artifact although tiny 1.1 mm aneurysm cannot be entirely excluded. Is difficult to determine if this was present previously as the present exam is of higher quality.  Nonvisualized left anterior inferior cerebellar artery.  Moderate narrowing origin of the right superior cerebellar artery.   Electronically Signed   By: Lacy Duverney M.D.   On: 04/07/2015 09:32   US Carotid Bilateral  04/06/2015   CLINICAL DATA:  Stroke today.  EXAM: BILATERAL CAROTID DUPLEX ULTRASOUND  TECHNIQUE: Wallace Cullens scale imaging, color Doppler and duplex ultrasound were performed of bilateral carotid and vertebral arteries in the neck.  COMPARISON:  None.  FINDINGS: Criteria: Quantification of carotid stenosis is based on velocity parameters that correlate the residual internal carotid diameter with NASCET-based stenosis levels, using the diameter of the distal internal carotid  lumen as the denominator for stenosis measurement.  The following velocity measurements were obtained:  RIGHT  ICA:  69 cm/sec  CCA:  60 cm/sec  SYSTOLIC ICA/CCA RATIO:  1.6  DIASTOLIC ICA/CCA RATIO:  1.7  ECA:  87 cm/sec  LEFT  ICA:  68 cm/sec  CCA:  66 cm/sec  SYSTOLIC ICA/CCA RATIO:  1.0  DIASTOLIC ICA/CCA RATIO:  1.4  ECA:  90 cm/sec  RIGHT CAROTID ARTERY: Minimal intimal thickening of the common carotid artery. Smooth noncalcified plaque at the carotid bulb. Normal Doppler waveforms demonstrated.  RIGHT VERTEBRAL  ARTERY:  Normal antegrade flow.  LEFT CAROTID ARTERY: Minimal intimal thickening of the common carotid artery. Minimal smooth atherosclerotic plaque at the carotid bulb. Normal Doppler waveforms.  LEFT VERTEBRAL ARTERY:  Normal antegrade flow.  IMPRESSION: Minimal smooth atherosclerotic plaque at the carotid bulbs bilaterally. No hemodynamically significant stenosis.  Normal antegrade flow within the vertebral arteries bilaterally.   Electronically Signed   By: Elberta Fortis M.D.   On: 04/06/2015 21:30   Mr Palma Holter  04/07/2015   CLINICAL DATA:  54 year old diabetic hypertensive female with hyperlipidemia and history of multiple sclerosis and migraine headaches. Presenting with headache and left-sided weakness with numbness. Subsequent encounter.  EXAM: MRI HEAD WITHOUT CONTRAST  MRA HEAD WITHOUT CONTRAST  TECHNIQUE: Multiplanar, multiecho pulse sequences of the brain and surrounding structures were obtained without intravenous contrast. Angiographic images of the head were obtained using MRA technique without contrast.  COMPARISON:  04/05/2015 head CT. 01/14/2009 brain MR and MR angiogram circle of Willis.  FINDINGS: MRI HEAD FINDINGS  No acute infarct.  No intracranial hemorrhage.  Scattered punctate nonspecific white matter type changes typically seen as a result of small vessel disease in a patient of this age with diabetes and hypertension. The appearance is not typical for diagnostic of multiple sclerosis which cannot be entirely excluded but felt to be less likely consideration.  No hydrocephalus.  Major intracranial vascular structures are patent.  No intracranial mass lesion noted on this unenhanced exam.  Cervical medullary junction, pituitary region, pineal region and orbital structures unremarkable.  MRA HEAD FINDINGS  Cavernous segment internal carotid artery ectasia with mild narrowing and irregularity.  Mild narrowing internal carotid artery supraclinoid segment more notable on the right.  Tiny  bulge distal A1 segment right anterior cerebral artery may represent origin of a vessel or result of motion artifact although tiny 1.1 mm aneurysm cannot be entirely excluded. Is difficult to determine if this was present previously as the present exam is of higher quality.  Left vertebral artery is dominant. No high-grade stenosis of the vertebral arteries.  Nonvisualized left anterior inferior cerebellar artery.  Moderate narrowing origin of the right superior cerebellar artery.  IMPRESSION: MRI HEAD  No acute infarct.  Scattered punctate nonspecific white matter type changes typically seen as a result of small vessel disease in a patient of this age with diabetes and hypertension. The appearance is not typical for diagnostic of multiple sclerosis.  MRA HEAD  Cavernous segment internal carotid artery ectasia with mild narrowing and irregularity.  Mild narrowing internal carotid artery supraclinoid segment more notable on the right.  Tiny bulge distal A1 segment right anterior cerebral artery may represent origin of a vessel or result of motion artifact although tiny 1.1 mm aneurysm cannot be entirely excluded. Is difficult to determine if this was present previously as the present exam is of higher quality.  Nonvisualized left anterior inferior cerebellar artery.  Moderate narrowing origin of the  right superior cerebellar artery.   Electronically Signed   By: Lacy Duverney M.D.   On: 04/07/2015 09:32    Scheduled Meds: . ALPRAZolam  1 mg Oral BID  . atorvastatin  80 mg Oral q1800  . citalopram  40 mg Oral Daily  . clopidogrel  75 mg Oral Daily  . enoxaparin (LOVENOX) injection  40 mg Subcutaneous Q24H  . famotidine  40 mg Oral Daily  . insulin aspart  0-5 Units Subcutaneous QHS  . insulin aspart  0-9 Units Subcutaneous TID WC  . levothyroxine  88 mcg Oral QAC breakfast  . potassium chloride  10 mEq Intravenous Q1 Hr x 3   Continuous Infusions:   Principal Problem:   Acute CVA (cerebrovascular  accident) Active Problems:   DM (diabetes mellitus)   Hypertension   COPD (chronic obstructive pulmonary disease)   Chronic pain syndrome   Cigarette smoker   Migraine   Hyperlipidemia    Time spent: 25 min    [facility]  [facility] Pager [phone]. If 7PM-7AM, please contact night-coverage at [email or web address], password [facility] 04/07/2015, 3:42 PM

## 2015-04-07 NOTE — Progress Notes (Signed)
Patient ID: [name], female   DOB: [birth year], 54 y.o.   MRN: [id number]    Nichole A. Merlene Laughter, MD     [email or web address]          [name] is an 54 y.o. female.   Assessment/Plan:  1. Subacute onset of severe headaches associated with weakness, numbness and pain of the left upper extremity.  2. Marked dyslipidemia. 3. Diabetes. 4. Hypertension. 5. Vitamin B12 deficiency.  6. Hypothyroidism. 7. Left-sided weakness and numbness. Given negative MRI of the brain, consider cervical spine disease especially given cervical spine surgery in the past.   RECOMMENDATION:  Cervical spine MRI. Consider high-dose steroids for couple of days. Aspirin or plavix.  Agree with static medications. Follow-up homocysteine level and HIV. Replace vitamin B12. Fioricet prn headaches.    She complained is still having severe headaches. She also continues to have severe left-sided neck and shoulder pain. She complains of pain involving the left upper extremity. She continues to have weakness and numbness of the left side.   GENERAL: This is a thin pleasant lady in no acute distress.  HEENT: Supple. Atraumatic normocephalic.   ABDOMEN: soft  EXTREMITIES: No edema   BACK: Normal.  SKIN: Normal by inspection.   MENTAL STATUS: Alert and oriented. Month and age are stated appropriately. Speech, language and cognition are generally intact. Judgment and insight normal.   CRANIAL NERVES: Pupils are equal, round and reactive to light and accommodation; extra ocular movements are full, there is no significant nystagmus; visual fields are full; upper and lower facial muscles are normal in strength and symmetric, there is mild flattening of the nasolabial fold-R; tongue is midline; uvula is midline; shoulder elevation is normal.  MOTOR: Normal tone, bulk and strength -R; downward drift of the left upper extremity. Mark drift left leg.Left deltoid and triceps 4/5.  Left hip flexion 4/5. Dorsiflexion 4 minus/5.   COORDINATION: Left finger to nose is normal, right finger to nose is normal, No rest tremor; no intention tremor; no postural tremor; no bradykinesia.  REFLEXES: Deep tendon reflexes are symmetrical and normal. Babinski reflexes are flexor bilaterally.   SENSATION: Significant reduced to light touch and temperature left upper and left leg. The extinction to visual or tactile double simultaneous stimulation.   The patient Brain MRI scan is reviewed in Person. 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 There are about 4 tiny deep white matter increased signal seen bilaterally. Overall the finding seems consistent with chronic ischemic changes and not characteristic of a demyelinating processes.  Objective: Vital signs in last 24 hours: Temp:  [97.1 F (36.2 C)-99.2 F (37.3 C)] 99.2 F (37.3 C) (05/31 1605) Pulse Rate:  [72-88] 81 (05/31 1605) Resp:  [16-20] 16 (05/31 1605) BP: (120-162)/(62-91) 132/78 mmHg (05/31 1605) SpO2:  [97 %-100 %] 97 % (05/31 1605) Weight:  [53.978 kg (119 lb)] 53.978 kg (119 lb) (05/31 0847)  Intake/Output from previous day: 05/30 0701 - 05/31 0700 In: 1370 [P.O.:1370] Out: 2825 [Urine:2825] Intake/Output this shift: Total I/O In: -  Out: 450 [Urine:450] Nutritional status: Diet Heart Room service appropriate?: Yes; Fluid consistency:: Thin   Lab Results: Results for orders placed or performed during the hospital encounter of 04/05/15 (from the past 48 hour(s))  Glucose, capillary     Status: Abnormal   Collection Time: 04/05/15 11:00 PM  Result Value Ref Range   Glucose-Capillary 117 (H) 65 - 99 mg/dL   Comment 1 Notify RN   Urine rapid drug screen (hosp  performed)     Status: Abnormal   Collection Time: 04/05/15 11:50 PM  Result Value Ref Range   Opiates NONE DETECTED NONE DETECTED   Cocaine NONE DETECTED NONE DETECTED   Benzodiazepines POSITIVE (A) NONE DETECTED   Amphetamines NONE  DETECTED NONE DETECTED   Tetrahydrocannabinol NONE DETECTED NONE DETECTED   Barbiturates NONE DETECTED NONE DETECTED    Comment:        DRUG SCREEN FOR MEDICAL PURPOSES ONLY.  IF CONFIRMATION IS NEEDED FOR ANY PURPOSE, NOTIFY LAB WITHIN 5 DAYS.        LOWEST DETECTABLE LIMITS FOR URINE DRUG SCREEN Drug Class       Cutoff (ng/mL) Amphetamine      1000 Barbiturate      200 Benzodiazepine   683 Tricyclics       419 Opiates          300 Cocaine          300 THC              50   Fasting lipid panel     Status: Abnormal   Collection Time: 04/06/15  7:14 AM  Result Value Ref Range   Cholesterol 345 (H) 0 - 200 mg/dL   Triglycerides 245 (H) <150 mg/dL   HDL 35 (L) >40 mg/dL   Total CHOL/HDL Ratio 9.9 RATIO   VLDL 49 (H) 0 - 40 mg/dL   LDL Cholesterol 261 (H) 0 - 99 mg/dL    Comment:        Total Cholesterol/HDL:CHD Risk Coronary Heart Disease Risk Table                     Men   Women  1/2 Average Risk   3.4   3.3  Average Risk       5.0   4.4  2 X Average Risk   9.6   7.1  3 X Average Risk  23.4   11.0        Use the calculated Patient Ratio above and the CHD Risk Table to determine the patient's CHD Risk.        ATP III CLASSIFICATION (LDL):  <100     mg/dL   Optimal  100-129  mg/dL   Near or Above                    Optimal  130-159  mg/dL   Borderline  160-189  mg/dL   High  >190     mg/dL   Very High   Glucose, capillary     Status: Abnormal   Collection Time: 04/06/15  7:47 AM  Result Value Ref Range   Glucose-Capillary 101 (H) 65 - 99 mg/dL   Comment 1 Notify RN   Glucose, capillary     Status: None   Collection Time: 04/06/15 12:07 PM  Result Value Ref Range   Glucose-Capillary 96 65 - 99 mg/dL  Glucose, capillary     Status: Abnormal   Collection Time: 04/06/15  6:22 PM  Result Value Ref Range   Glucose-Capillary 106 (H) 65 - 99 mg/dL  Glucose, capillary     Status: Abnormal   Collection Time: 04/06/15 10:33 PM  Result Value Ref Range    Glucose-Capillary 114 (H) 65 - 99 mg/dL  Sedimentation rate     Status: None   Collection Time: 04/07/15  5:27 AM  Result Value Ref Range   Sed Rate 10 0 - 22 mm/hr  Glucose, capillary     Status: None   Collection Time: 04/07/15  7:40 AM  Result Value Ref Range   Glucose-Capillary 98 65 - 99 mg/dL  Glucose, capillary     Status: Abnormal   Collection Time: 04/07/15 11:38 AM  Result Value Ref Range   Glucose-Capillary 129 (H) 65 - 99 mg/dL  Glucose, capillary     Status: Abnormal   Collection Time: 04/07/15  4:33 PM  Result Value Ref Range   Glucose-Capillary 102 (H) 65 - 99 mg/dL   VIT B12 220;  TSH 62;    CRP less than 0.5;   ESR 10;     Lipid Panel  Recent Labs  04/06/15 0714  CHOL 345*  TRIG 245*  HDL 35*  CHOLHDL 9.9  VLDL 49*  LDLCALC 261*    Studies/Results:  TTE - Left ventricle: The cavity size was normal. Systolic function was normal. The estimated ejection fraction was in the range of 50% to 55%. Wall motion was normal; there were no regional wall motion abnormalities. Doppler parameters are consistent with abnormal left ventricular relaxation (grade 1 diastolic dysfunction). There was no evidence of elevated ventricular filling pressure by Doppler parameters. - Aortic valve: Trileaflet; normal thickness leaflets. There was no regurgitation. - Aortic root: The aortic root was normal in size. - Mitral valve: Mitral valve leaflets are moderately thickened. Anterior leaflet is bowing in systole (not prolapsing) associated with posteriorly directed jet and moderate mitral regurgitation. There was mild to moderate regurgitation. - Right ventricle: The cavity size was normal. Wall thickness was normal. Systolic function was normal. - Right atrium: The atrium was normal in size. - Tricuspid valve: There was mild regurgitation. - Pulmonic valve: There was trivial regurgitation. - Pulmonary arteries: Systolic pressure was within the  normal range. - Inferior vena cava: The vessel was normal in size. - Pericardium, extracardiac: There was no pericardial effusion.  CAROTID DOPPLER NORMAL  -----------BRAIN MRI/MRA MRI HEAD  No acute infarct.  Scattered punctate nonspecific white matter type changes typically seen as a result of small vessel disease in a patient of this age with diabetes and hypertension. The appearance is not typical for diagnostic of multiple sclerosis.  MRA HEAD  Cavernous segment internal carotid artery ectasia with mild narrowing and irregularity.  Mild narrowing internal carotid artery supraclinoid segment more notable on the right.  Tiny bulge distal A1 segment right anterior cerebral artery may represent origin of a vessel or result of motion artifact although tiny 1.1 mm aneurysm cannot be entirely excluded. Is difficult to determine if this was present previously as the present exam is of higher quality.  Nonvisualized left anterior inferior cerebellar artery.  Moderate narrowing origin of the right superior cerebellar artery.----------------   Medications:  Scheduled Meds: . ALPRAZolam  1 mg Oral BID  . atorvastatin  80 mg Oral q1800  . citalopram  40 mg Oral Daily  . clopidogrel  75 mg Oral Daily  . enoxaparin (LOVENOX) injection  40 mg Subcutaneous Q24H  . famotidine  40 mg Oral Daily  . insulin aspart  0-5 Units Subcutaneous QHS  . insulin aspart  0-9 Units Subcutaneous TID WC  . levothyroxine  88 mcg Oral QAC breakfast   Continuous Infusions:  PRN Meds:.albuterol, oxyCODONE-acetaminophen, prochlorperazine, promethazine, senna-docusate       [name], M.D.  Diplomate, Tax adviser of Psychiatry and Neurology ( Neurology).

## 2015-04-07 NOTE — Plan of Care (Signed)
Problem: Consults Goal: Ischemic Stroke Patient Education See Patient Education Module for education specifics.  Outcome: Completed/Met Date Met:  04/07/15 Stroke mapping booklet ordered and given to patient. Reviewed signs/symptoms of stroke along with diagnostic workup. Will continue to educate.

## 2015-04-07 NOTE — Care Management Note (Signed)
Case Management Note  Patient Details  Name: ALYVIAH CRANDLE MRN: [id number] Date of Birth: 09/29/1961  Subjective/Objective:                  Pt admitted from home with CVA. Pt lives with her husband and will return home at discharge. Pt is independent with ADL's.  Action/Plan: PT recommended outpt PT services. Pt is agreeable and order faxed to [facility] outpt therapy dept. They will call pt with appt times. No further CM needs noted.  Expected Discharge Date:  04/08/15               Expected Discharge Plan:  Home/Self Care  In-House Referral:  NA  Discharge planning Services  CM Consult  Post Acute Care Choice:  NA Choice offered to:  NA  DME Arranged:    DME Agency:     HH Arranged:    HH Agency:     Status of Service:  Completed, signed off  Medicare Important Message Given:    Date Medicare IM Given:    Medicare IM give by:    Date Additional Medicare IM Given:    Additional Medicare Important Message give by:     If discussed at Long Length of Stay Meetings, dates discussed:    Additional Comments:  Cheryl Flash, RN 04/07/2015, 1:46 PM

## 2015-04-07 NOTE — Progress Notes (Signed)
OT Cancellation  Patient Details Name: [name] MRN: [id number] DOB: 06/02/1961   Cancelled Treatment:     Reason evaluation not completed: Pt out of room for MRI. Will try again later today if possible.   Ezra Sites, OTR/L  [phone]  04/07/2015, 8:55 AM

## 2015-04-07 NOTE — Progress Notes (Signed)
PT Cancellation Note  Patient Details Name: [name] MRN: [id number] DOB: June 26, 1961   Cancelled Treatment:    Reason Eval/Treat Not Completed: Fatigue/lethargy limiting ability to participate   Konrad Penta  PT 04/07/2015, 1:55 PM

## 2015-04-08 ENCOUNTER — Observation Stay (HOSPITAL_COMMUNITY): Payer: 59

## 2015-04-08 DIAGNOSIS — R51 Headache: Secondary | ICD-10-CM | POA: Diagnosis not present

## 2015-04-08 DIAGNOSIS — R739 Hyperglycemia, unspecified: Secondary | ICD-10-CM | POA: Diagnosis present

## 2015-04-08 DIAGNOSIS — M6289 Other specified disorders of muscle: Secondary | ICD-10-CM | POA: Diagnosis not present

## 2015-04-08 DIAGNOSIS — R531 Weakness: Secondary | ICD-10-CM | POA: Insufficient documentation

## 2015-04-08 DIAGNOSIS — G894 Chronic pain syndrome: Secondary | ICD-10-CM | POA: Diagnosis not present

## 2015-04-08 DIAGNOSIS — R519 Headache, unspecified: Secondary | ICD-10-CM | POA: Insufficient documentation

## 2015-04-08 DIAGNOSIS — I10 Essential (primary) hypertension: Secondary | ICD-10-CM

## 2015-04-08 DIAGNOSIS — F17200 Nicotine dependence, unspecified, uncomplicated: Secondary | ICD-10-CM | POA: Diagnosis present

## 2015-04-08 LAB — GLUCOSE, CAPILLARY
GLUCOSE-CAPILLARY: 186 mg/dL — AB (ref 65–99)
GLUCOSE-CAPILLARY: 171 mg/dL — AB (ref 65–99)
Glucose-Capillary: 223 mg/dL — ABNORMAL HIGH (ref 65–99)

## 2015-04-08 LAB — HIV ANTIBODY (ROUTINE TESTING W REFLEX): HIV Screen 4th Generation wRfx: NONREACTIVE

## 2015-04-08 LAB — T4, FREE: Free T4: 0.66 ng/dL (ref 0.61–1.12)

## 2015-04-08 LAB — HOMOCYSTEINE: Homocysteine: 13 umol/L (ref 0.0–15.0)

## 2015-04-08 LAB — BASIC METABOLIC PANEL
ANION GAP: 10 (ref 5–15)
BUN: 7 mg/dL (ref 6–20)
CO2: 29 mmol/L (ref 22–32)
Calcium: 9.3 mg/dL (ref 8.9–10.3)
Chloride: 104 mmol/L (ref 101–111)
Creatinine, Ser: 0.84 mg/dL (ref 0.44–1.00)
GFR calc Af Amer: >60 mL/min (ref >60)
GFR calc non Af Amer: >60 mL/min (ref >60)
GLUCOSE: 179 mg/dL — AB (ref 65–99)
Potassium: 4.1 mmol/L (ref 3.5–5.1)
SODIUM: 143 mmol/L (ref 135–145)

## 2015-04-08 LAB — RPR: RPR: NONREACTIVE

## 2015-04-08 MED ORDER — BUTALBITAL-APAP-CAFFEINE 50-325-40 MG PO TABS: 2.0000 | ORAL_TABLET | ORAL | Status: DC | PRN

## 2015-04-08 MED ORDER — FAMOTIDINE 20 MG PO TABS: 40.0000 mg | ORAL_TABLET | Freq: Every day | ORAL | Status: DC

## 2015-04-08 MED ORDER — OXYCODONE-ACETAMINOPHEN 5-325 MG PO TABS: 1.0000 | ORAL_TABLET | Freq: Four times a day (QID) | ORAL | Status: DC | PRN

## 2015-04-08 MED ORDER — LORAZEPAM 2 MG/ML IJ SOLN: 0.5000 mg | Freq: Once | INTRAMUSCULAR | Status: DC

## 2015-04-08 MED ORDER — PREDNISONE 20 MG PO TABS: ORAL_TABLET | ORAL | Status: DC

## 2015-04-08 MED ORDER — CLOPIDOGREL BISULFATE 75 MG PO TABS: 75.0000 mg | ORAL_TABLET | Freq: Every day | ORAL | Status: DC

## 2015-04-08 NOTE — Evaluation (Signed)
Occupational Therapy Evaluation Patient Details Name: [name] MRN: [id number] DOB: 1961/05/26 Today's Date: 04/08/2015    History of Present Illness [name] is an 54 y.o. right handed female, with hx of MS along with frequent migraine, HTN, DM, hyperlipidemia, COPD, active tobacco use, fibromyalgia, sleep apnea, neuropathy, anxiety, gout, presented to the ER with HA for 2-3 weeks, and yesterday, with left sided weakness. She denied any visual changes, slurred speech, facial droop, but admitted to feeling nauseated. She had no fever, chills, vertigo, or any visual disturbances. She stated that her HA is different than her usual "migraines" which she has been having about 3-4 times a month, not on any suppressive therapy. She had no prior similar occurances. She has 2 daughters with migraine as well. Evalaution in the ER included a head CT with no acute abnormality, and serology was unremarkable.    Clinical Impression   PTA pt lived at home with husband, child & spouse, & 2 grandchildren. Pt is awake, alert, and oriented x4 this am. Pt is mod I in BADL tasks and bed mobility tasks. Pt demonstrates decreased LUE range of motion (75%) and strength-shoulder 4-/5, elbow/forearm 3+/5, wrist/hand 3-/5. Pt reports her left index and middle fingers "get stuck" in flexion at PIP joints, remaining in flexion for a short time before "snapping back." OT did not witness this during evaluation. Pt reports blurry vision and diplopia during visual assessment, diplopia resolving with one eye closed. Pt is able to complete all B/IADL tasks using additional time and compensating for LUE weakness. Recommend outpatient OT to work on LUE strengthening and increasing range of motion to increase pt's ability to return to highest level of functioning possible. No further acute OT services required.     Follow Up Recommendations  Outpatient OT    Equipment Recommendations  None recommended by OT        Precautions / Restrictions Precautions Precautions: Fall Restrictions Weight Bearing Restrictions: No      Mobility Bed Mobility Overal bed mobility: Independent Bed Mobility: Supine to Sit;Sit to Supine     Supine to sit: Independent Sit to supine: Independent              ADL Overall ADL's : Modified independent Eating/Feeding: Independent                   Lower Body Dressing: Modified independent                       Vision Vision Assessment?: Yes Eye Alignment: Within Functional Limits Ocular Range of Motion: Within Functional Limits Alignment/Gaze Preference: Within Defined Limits Tracking/Visual Pursuits: Decreased smoothness of horizontal tracking;Decreased smoothness of vertical tracking Saccades: Within functional limits Convergence: Within functional limits Visual Fields: No apparent deficits Diplopia Assessment: Disappears with one eye closed       Hand Dominance Right   Extremity/Trunk Assessment Upper Extremity Assessment Upper Extremity Assessment: LUE deficits/detail LUE Deficits / Details: Decreased strength in LUE   Lower Extremity Assessment Lower Extremity Assessment: Defer to PT evaluation       Communication Communication Communication: No difficulties   Cognition Arousal/Alertness: Awake/alert Behavior During Therapy: WFL for tasks assessed/performed Overall Cognitive Status: Within Functional Limits for tasks assessed                                Home Living Family/patient expects to be discharged to:: Private residence Living  Arrangements: Spouse/significant other Available Help at Discharge: Family;Available 24 hours/day Type of Home: House             Bathroom Shower/Tub: Chief Strategy Officer: Standard     Home Equipment: Environmental consultant - 2 wheels;Cane - single point;Bedside commode;Shower seat;Tub bench;Grab bars - tub/shower;Grab bars - toilet          Prior  Functioning/Environment Level of Independence: Independent             OT Diagnosis: Generalized weakness (LUE)   OT Problem List: Decreased strength;Impaired UE functional use    End of Session    Activity Tolerance: Patient tolerated treatment well Patient left: in bed;with call bell/phone within reach   Time: 0835-0906 OT Time Calculation (min): 31 min Charges:  OT General Charges $OT Visit: 1 Procedure OT Evaluation $Initial OT Evaluation Tier I: 1 Procedure G-Codes: OT G-codes **NOT FOR INPATIENT CLASS** Functional Assessment Tool Used: Clinical judgement Functional Limitation: Self care Self Care Current Status (R5615): At least 1 percent but less than 20 percent impaired, limited or restricted Self Care Goal Status (P7943): 0 percent impaired, limited or restricted Self Care Discharge Status [phone]): At least 1 percent but less than 20 percent impaired, limited or restricted    Ezra Sites, OTR/L  [phone]  04/08/2015, 9:13 AM

## 2015-04-08 NOTE — Progress Notes (Signed)
Physical Therapy Treatment Patient Details Name: [name] MRN: [id number] DOB: 1961/01/28 Today's Date: 04/08/2015    History of Present Illness [name] is an 54 y.o. right handed female, with hx of MS along with frequent migraine, HTN, DM, hyperlipidemia, COPD, active tobacco use, fibromyalgia, sleep apnea, neuropathy, anxiety, gout, presented to the ER with HA for 2-3 weeks, and yesterday, with left sided weakness. She denied any visual changes, slurred speech, facial droop, but admitted to feeling nauseated. She had no fever, chills, vertigo, or any visual disturbances. She stated that her HA is different than her usual "migraines" which she has been having about 3-4 times a month, not on any suppressive therapy. She had no prior similar occurances. She has 2 daughters with migraine as well. Evalaution in the ER included a head CT with no acute abnormality, and serology was unremarkable.     PT Comments    Pt tolerating treatment session well, motivated and able to complete entire PT sesssion as planned in spite of c/o 8/10 pain in head and LUE. Pt continues to make progress toward goals as evidenced by increase in ambulation distance and activity tolerance. Pt's greatest limitations continue to be weakness and pain which continues to limit ability to perform safe household ambulation at baseline level of function. Patient presenting with impairment of strength, pain, and activity tolerance, limiting ability to perform ADL and mobility tasks at  baseline level of function. Patient will benefit from skilled intervention to address the above impairments and limitations, in order to restore to prior level of function, improve patient safety upon discharge, and to decrease caregiver burden.    Follow Up Recommendations  Outpatient PT     Equipment Recommendations  None recommended by PT    Recommendations for Other Services       Precautions / Restrictions  Precautions Precautions: Fall Restrictions Weight Bearing Restrictions: No    Mobility  Bed Mobility Overal bed mobility: Modified Independent Bed Mobility: Supine to Sit     Supine to sit: Modified independent (Device/Increase time)        Transfers Overall transfer level: Needs assistance Equipment used: Rolling walker (2 wheeled) Transfers: Sit to/from Stand Sit to Stand: Supervision         General transfer comment: 1x from bedside, 1x from toilet. Transfer is performed with heavy use of BUE and moderate effort. Once standing, patient is steady on feet with zero LOB.   Ambulation/Gait Ambulation/Gait assistance: Supervision Ambulation Distance (Feet): 600 Feet Assistive device: Rolling walker (2 wheeled) Gait Pattern/deviations: Decreased step length - right;Antalgic;Wide base of support Gait velocity: .37m/s indicative for high falls risk; suitable for household ambulation only.  Gait velocity interpretation: <1.8 ft/sec, indicative of risk for recurrent falls General Gait Details: very slow and decreased propulsion from LLE. Stopped 1x to rest due to C/O of lightheadedness and SOB at around 440ft, otherwise conversational during effort.    Stairs            Wheelchair Mobility    Modified Rankin (Stroke Patients Only)       Balance Overall balance assessment: Modified Independent Sitting-balance support: No upper extremity supported Sitting balance-Leahy Scale: Good     Standing balance support: No upper extremity supported Standing balance-Leahy Scale: Good Standing balance comment: Able to tolerate standing without UE support for 2+ minutes during toileting and gown change.                     Cognition Arousal/Alertness:  Awake/alert Behavior During Therapy: WFL for tasks assessed/performed Overall Cognitive Status: Within Functional Limits for tasks assessed                      Exercises      General Comments         Pertinent Vitals/Pain Pain Assessment: 0-10 Pain Score: 8  Pain Location: Head and LUE  Pain Intervention(s): Monitored during session    Home Living                      Prior Function            PT Goals (current goals can now be found in the care plan section) Acute Rehab PT Goals Patient Stated Goal: return home PT Goal Formulation: With patient Time For Goal Achievement: 04/20/15 Potential to Achieve Goals: Good Progress towards PT goals: Progressing toward goals    Frequency  Min 5X/week    PT Plan Current plan remains appropriate    Co-evaluation             End of Session Equipment Utilized During Treatment: Gait belt Activity Tolerance: Patient limited by fatigue Patient left: in chair;with family/visitor present;with call bell/phone within reach     Time: 1610-9604 PT Time Calculation (min) (ACUTE ONLY): 30 min  Charges:  $Therapeutic Activity: 23-37 mins                    G Codes:      Ellery Meroney C 04/26/15, 2:34 PM  2:37 PM  Rosamaria Lints, PT, DPT [place] License # [id number]

## 2015-04-08 NOTE — Progress Notes (Signed)
PT Cancellation Note  Patient Details Name: [name] MRN: [id number] DOB: [birth year]   Cancelled Treatment:    Reason Eval/Treat Not Completed: Patient at procedure or test/unavailable. Chart reviewed, RN consulted. Treatment attempted, but pt is down for MRI. Will attempt at later date/time.     Buccola,Allan C 04/08/2015, 9:41 AM  9:42 AM  Rosamaria Lints, PT, DPT  License # [id number]

## 2015-04-08 NOTE — Discharge Summary (Signed)
Physician Discharge Summary  [name] ZOX:[id number] DOB: 21-May-1961 DOA: 04/05/2015  PCP: [name], MD  Admit date: 04/05/2015 Discharge date: 04/08/2015  Time spent: 40 minutes  Recommendations for Outpatient Follow-up:  1. Follow up with Dr Gerilyn Pilgrim neurology 6 weeks for evaluation of headache and left side weakness 2. PCP Dr Margo Aye 4 weeks for follow TSH to evaluate medication adjustment made just prior to admission.   Discharge Diagnoses:    DM (diabetes mellitus)   Hypertension   COPD (chronic obstructive pulmonary disease)   Chronic pain syndrome   Cigarette smoker   Migraine   Hyperlipidemia   Hyperglycemia   Tobacco use disorder   Discharge Condition: stable   Diet recommendation: heart healthy  Filed Weights   04/05/15 1735 04/05/15 2148  Weight: 49.896 kg (110 lb) 54.2 kg (119 lb 7.8 oz)    History of present illness:  [name] is an 54 y.o. right handed female, with hx of MS along with frequent migraine, HTN, DM, hyperlipidemia, COPD, active tobacco use, fibromyalgia, sleep apnea, neuropathy, anxiety, gout, presented to the ER on 04/05/15 with HA for 2-3 weeks, and day prior developed left sided weakness. She denied any visual changes, slurred speech, facial droop, but admitted to feeling nauseated. She had no fever, chills, vertigo, or any visual disturbances. She stated that her HA was different than her usual "migraines" which she had been having about 3-4 times a month, not on any suppressive therapy. She had no prior similar occurances. She has 2 daughters with migraine as well. Evalaution in the ER included a head CT with no acute abnormality, and serology was unremarkable. Her EKG showed NSR>  Hospital Course:  1. Left-sided weakness- resolved at discharge. Concern for stroke.  MRIs negative for acute stroke, she has a history of multiple sclerosis. Cervical MRI with mild degenerative disc disease.  Neurology evaluated and opined related to subacute  severe headache. Recommended statin, plavix and steroids as well as fioricet for headache. Follow up 6 weeks 2. Hyperlipidemia- in setting of ongoing tobacco use. Continue statin 3. Borderline diabetes mellitus- hemoglobin A1c 5.9 4. Hypokalemia- replaced and resolved at discharge.   5. Hypothyroid: being follow by PCP. Synthroid recently adjusted per PCP. Will need repeat TSH 4 weeks for further evaluation 6. Headache: see above.   Procedures:  none  Consultations: Dr Gerilyn Pilgrim neurology Discharge Exam: Filed Vitals:   04/08/15 1609  BP: 151/73  Pulse: 82  Temp: 98.6 F (37 C)  Resp:     General: well nourished appears well Cardiovascular: RRR no MGR No LE edema Respiratory: normal effort BS clear bilaterally Neuro: bilateral grip 5/5.   Discharge Instructions   Discharge Instructions    Diet - low sodium heart healthy    Complete by:  As directed      Increase activity slowly    Complete by:  As directed           Current Discharge Medication List    START taking these medications   Details  butalbital-acetaminophen-caffeine (FIORICET, ESGIC) 50-325-40 MG per tablet Take 2 tablets by mouth every 4 (four) hours as needed for headache. Qty: 14 tablet, Refills: 0    clopidogrel (PLAVIX) 75 MG tablet Take 1 tablet (75 mg total) by mouth daily. Qty: 30 tablet, Refills: 0    oxyCODONE-acetaminophen (PERCOCET/ROXICET) 5-325 MG per tablet Take 1-2 tablets by mouth every 6 (six) hours as needed for moderate pain or severe pain. Qty: 15 tablet, Refills: 0  predniSONE (DELTASONE) 20 MG tablet Take 2 tabs for 3 days then stop Qty: 6 tablet, Refills: 0      CONTINUE these medications which have CHANGED   Details  famotidine (PEPCID) 20 MG tablet Take 2 tablets (40 mg total) by mouth daily.      CONTINUE these medications which have NOT CHANGED   Details  albuterol (PROVENTIL HFA;VENTOLIN HFA) 108 (90 BASE) MCG/ACT inhaler Inhale 2 puffs into the lungs every 6  (six) hours as needed. Shortness of breath.    ALPRAZolam (XANAX) 1 MG tablet Take 1 mg by mouth 2 (two) times daily.     citalopram (CELEXA) 40 MG tablet Take 40 mg by mouth daily.    cyclobenzaprine (FLEXERIL) 10 MG tablet Take 10 mg by mouth 3 (three) times daily as needed for muscle spasms.    Fluticasone Furoate-Vilanterol (BREO ELLIPTA) 100-25 MCG/INH AEPB Inhale 1 puff into the lungs daily.    furosemide (LASIX) 20 MG tablet Take 20 mg by mouth daily.    HYDROcodone-acetaminophen (NORCO) 10-325 MG per tablet Take 1 tablet by mouth every 4 (four) hours as needed for moderate pain or severe pain. Qty: 40 tablet, Refills: 0    levothyroxine (SYNTHROID, LEVOTHROID) 88 MCG tablet Take 88 mcg by mouth daily before breakfast.    lisinopril (PRINIVIL,ZESTRIL) 40 MG tablet Take 40 mg by mouth daily.    dicyclomine (BENTYL) 20 MG tablet Take 1 tablet (20 mg total) by mouth 4 (four) times daily -  before meals and at bedtime. Qty: 120 tablet, Refills: 11    ondansetron (ZOFRAN) 4 MG tablet Take 1 tablet (4 mg total) by mouth every 6 (six) hours as needed for nausea. Qty: 20 tablet, Refills: 0       Allergies  Allergen Reactions  . Demerol [Meperidine] Anaphylaxis and Swelling  . Imitrex [Sumatriptan] Anaphylaxis  . Penicillins Anaphylaxis and Swelling  . Sulfa Antibiotics Anaphylaxis  . Levaquin [Levofloxacin In D5w]     Abdominal pain  . Neurontin [Gabapentin] Other (See Comments)    Abdominal pain  . Toradol [Ketorolac Tromethamine] Other (See Comments)    "Feels like my whole body is on fire."  . Tramadol Other (See Comments)    Stomach upset   . Doxycycline Nausea And Vomiting        Follow-up Information    Follow up with [name], MD. Call in 4 weeks.   Specialty:  Internal Medicine   Why:  follow up TSH after medication adjustment   Contact information:    [address]  [place] [address] [phone]       Follow up with Beryle Beams, MD.    Specialty:  Neurology   Contact information:   [address] [place] [address] [phone]        The results of significant diagnostics from this hospitalization (including imaging, microbiology, ancillary and laboratory) are listed below for reference.    Significant Diagnostic Studies: Dg Chest 2 View  04/05/2015   CLINICAL DATA:  Weakness and arm pain  EXAM: CHEST  2 VIEW  COMPARISON:  04/16/2014  FINDINGS: The heart size and mediastinal contours are within normal limits. Both lungs are clear. The visualized skeletal structures are unremarkable. Postsurgical changes are noted in the cervical spine.  IMPRESSION: No active cardiopulmonary disease.   Electronically Signed   By: Alcide Clever M.D.   On: 04/05/2015 19:03   Ct Head Wo Contrast  04/05/2015   CLINICAL DATA:  Severe pressure  sensation in the head with left arm weakness and nausea. Symptoms for 3 days.  EXAM: CT HEAD WITHOUT CONTRAST  TECHNIQUE: Contiguous axial images were obtained from the base of the skull through the vertex without intravenous contrast.  COMPARISON:  Head CT scan 05/16/2013.  FINDINGS: The brain appears normal without hemorrhage, infarct, mass lesion, mass effect, midline shift or abnormal extra-axial fluid collection. No hydrocephalus or pneumocephalus. The calvarium is intact. Imaged paranasal sinuses and mastoid air cells are clear.  IMPRESSION: Normal head CT.   Electronically Signed   By: Drusilla Kanner M.D.   On: 04/05/2015 18:46   Mri Brain Without Contrast  04/07/2015   CLINICAL DATA:  54 year old diabetic hypertensive female with hyperlipidemia and history of multiple sclerosis and migraine headaches. Presenting with headache and left-sided weakness with numbness. Subsequent encounter.  EXAM: MRI HEAD WITHOUT CONTRAST  MRA HEAD WITHOUT CONTRAST  TECHNIQUE: Multiplanar, multiecho pulse sequences of the brain and surrounding structures were obtained without intravenous contrast. Angiographic  images of the head were obtained using MRA technique without contrast.  COMPARISON:  04/05/2015 head CT. 01/14/2009 brain MR and MR angiogram circle of Willis.  FINDINGS: MRI HEAD FINDINGS  No acute infarct.  No intracranial hemorrhage.  Scattered punctate nonspecific white matter type changes typically seen as a result of small vessel disease in a patient of this age with diabetes and hypertension. The appearance is not typical for diagnostic of multiple sclerosis which cannot be entirely excluded but felt to be less likely consideration.  No hydrocephalus.  Major intracranial vascular structures are patent.  No intracranial mass lesion noted on this unenhanced exam.  Cervical medullary junction, pituitary region, pineal region and orbital structures unremarkable.  MRA HEAD FINDINGS  Cavernous segment internal carotid artery ectasia with mild narrowing and irregularity.  Mild narrowing internal carotid artery supraclinoid segment more notable on the right.  Tiny bulge distal A1 segment right anterior cerebral artery may represent origin of a vessel or result of motion artifact although tiny 1.1 mm aneurysm cannot be entirely excluded. Is difficult to determine if this was present previously as the present exam is of higher quality.  Left vertebral artery is dominant. No high-grade stenosis of the vertebral arteries.  Nonvisualized left anterior inferior cerebellar artery.  Moderate narrowing origin of the right superior cerebellar artery.  IMPRESSION: MRI HEAD  No acute infarct.  Scattered punctate nonspecific white matter type changes typically seen as a result of small vessel disease in a patient of this age with diabetes and hypertension. The appearance is not typical for diagnostic of multiple sclerosis.  MRA HEAD  Cavernous segment internal carotid artery ectasia with mild narrowing and irregularity.  Mild narrowing internal carotid artery supraclinoid segment more notable on the right.  Tiny bulge distal A1  segment right anterior cerebral artery may represent origin of a vessel or result of motion artifact although tiny 1.1 mm aneurysm cannot be entirely excluded. Is difficult to determine if this was present previously as the present exam is of higher quality.  Nonvisualized left anterior inferior cerebellar artery.  Moderate narrowing origin of the right superior cerebellar artery.   Electronically Signed   By: Lacy Duverney M.D.   On: 04/07/2015 09:32   Mr Cervical Spine Wo Contrast  04/08/2015   CLINICAL DATA:  Left-sided weakness. History of stroke, multiple sclerosis, migraine headaches and cervical fusion. Subsequent encounter.  EXAM: MRI CERVICAL SPINE WITHOUT CONTRAST  TECHNIQUE: Multiplanar, multisequence MR imaging of the cervical spine was performed. No intravenous  contrast was administered.  COMPARISON:  Radiographs 12/30/2013. MRI 05/23/2013. More recent MRI performed 03/07/2014 at [facility] unavailable.  FINDINGS: The cervical alignment is normal status post C4-6 ACDF with an anterior plate and screws. There is no bone marrow edema or evidence of posterior ligamentous injury.  The craniocervical junction appears normal. The cervical cord is normal in signal and caliber. There are bilateral vertebral artery flow voids.  There are no significant disc space findings at C1-2 or C2-3.  C3-4: Small central disc protrusion without cord deformity or foraminal compromise.  C4-5:  Status post ACDF.  No cord deformity or foraminal compromise.  C5-6:  Status post ACDF.  No cord deformity or foraminal compromise.  C6-7: Stable mild disc bulging. No cord deformity or foraminal compromise.  C7-T1: No significant findings.  IMPRESSION: 1. No complication status post C4-6 ACDF. The operative levels demonstrate no residual spinal stenosis or nerve root encroachment. Please note previous postoperative study unavailable for comparison. 2. Mild disc degeneration at C3-4 and C6-7 without cord deformity or  nerve root encroachment. 3. No evidence of demyelinating lesions within the cervical cord or visualized posterior fossa.   Electronically Signed   By: Carey Bullocks M.D.   On: 04/08/2015 10:25   US Carotid Bilateral  04/06/2015   CLINICAL DATA:  Stroke today.  EXAM: BILATERAL CAROTID DUPLEX ULTRASOUND  TECHNIQUE: Wallace Cullens scale imaging, color Doppler and duplex ultrasound were performed of bilateral carotid and vertebral arteries in the neck.  COMPARISON:  None.  FINDINGS: Criteria: Quantification of carotid stenosis is based on velocity parameters that correlate the residual internal carotid diameter with NASCET-based stenosis levels, using the diameter of the distal internal carotid lumen as the denominator for stenosis measurement.  The following velocity measurements were obtained:  RIGHT  ICA:  69 cm/sec  CCA:  60 cm/sec  SYSTOLIC ICA/CCA RATIO:  1.6  DIASTOLIC ICA/CCA RATIO:  1.7  ECA:  87 cm/sec  LEFT  ICA:  68 cm/sec  CCA:  66 cm/sec  SYSTOLIC ICA/CCA RATIO:  1.0  DIASTOLIC ICA/CCA RATIO:  1.4  ECA:  90 cm/sec  RIGHT CAROTID ARTERY: Minimal intimal thickening of the common carotid artery. Smooth noncalcified plaque at the carotid bulb. Normal Doppler waveforms demonstrated.  RIGHT VERTEBRAL ARTERY:  Normal antegrade flow.  LEFT CAROTID ARTERY: Minimal intimal thickening of the common carotid artery. Minimal smooth atherosclerotic plaque at the carotid bulb. Normal Doppler waveforms.  LEFT VERTEBRAL ARTERY:  Normal antegrade flow.  IMPRESSION: Minimal smooth atherosclerotic plaque at the carotid bulbs bilaterally. No hemodynamically significant stenosis.  Normal antegrade flow within the vertebral arteries bilaterally.   Electronically Signed   By: Elberta Fortis M.D.   On: 04/06/2015 21:30   Mr Palma Holter  04/07/2015   CLINICAL DATA:  54 year old diabetic hypertensive female with hyperlipidemia and history of multiple sclerosis and migraine headaches. Presenting with headache and left-sided weakness with  numbness. Subsequent encounter.  EXAM: MRI HEAD WITHOUT CONTRAST  MRA HEAD WITHOUT CONTRAST  TECHNIQUE: Multiplanar, multiecho pulse sequences of the brain and surrounding structures were obtained without intravenous contrast. Angiographic images of the head were obtained using MRA technique without contrast.  COMPARISON:  04/05/2015 head CT. 01/14/2009 brain MR and MR angiogram circle of Willis.  FINDINGS: MRI HEAD FINDINGS  No acute infarct.  No intracranial hemorrhage.  Scattered punctate nonspecific white matter type changes typically seen as a result of small vessel disease in a patient of this age with diabetes and hypertension. The appearance is not typical  for diagnostic of multiple sclerosis which cannot be entirely excluded but felt to be less likely consideration.  No hydrocephalus.  Major intracranial vascular structures are patent.  No intracranial mass lesion noted on this unenhanced exam.  Cervical medullary junction, pituitary region, pineal region and orbital structures unremarkable.  MRA HEAD FINDINGS  Cavernous segment internal carotid artery ectasia with mild narrowing and irregularity.  Mild narrowing internal carotid artery supraclinoid segment more notable on the right.  Tiny bulge distal A1 segment right anterior cerebral artery may represent origin of a vessel or result of motion artifact although tiny 1.1 mm aneurysm cannot be entirely excluded. Is difficult to determine if this was present previously as the present exam is of higher quality.  Left vertebral artery is dominant. No high-grade stenosis of the vertebral arteries.  Nonvisualized left anterior inferior cerebellar artery.  Moderate narrowing origin of the right superior cerebellar artery.  IMPRESSION: MRI HEAD  No acute infarct.  Scattered punctate nonspecific white matter type changes typically seen as a result of small vessel disease in a patient of this age with diabetes and hypertension. The appearance is not typical for  diagnostic of multiple sclerosis.  MRA HEAD  Cavernous segment internal carotid artery ectasia with mild narrowing and irregularity.  Mild narrowing internal carotid artery supraclinoid segment more notable on the right.  Tiny bulge distal A1 segment right anterior cerebral artery may represent origin of a vessel or result of motion artifact although tiny 1.1 mm aneurysm cannot be entirely excluded. Is difficult to determine if this was present previously as the present exam is of higher quality.  Nonvisualized left anterior inferior cerebellar artery.  Moderate narrowing origin of the right superior cerebellar artery.   Electronically Signed   By: Lacy Duverney M.D.   On: 04/07/2015 09:32    Microbiology: No results found for this or any previous visit (from the past 240 hour(s)).   Labs: Basic Metabolic Panel:  Recent Labs Lab 04/05/15 1815 04/08/15 0645  NA 142 143  K 3.1* 4.1  CL 102 104  CO2 30 29  GLUCOSE 103* 179*  BUN 9 7  CREATININE 0.81 0.84  CALCIUM 9.0 9.3   Liver Function Tests:  Recent Labs Lab 04/05/15 1815  AST 15  ALT 11*  ALKPHOS 65  BILITOT 0.4  PROT 6.8  ALBUMIN 4.0   No results for input(s): LIPASE, AMYLASE in the last 168 hours. No results for input(s): AMMONIA in the last 168 hours. CBC:  Recent Labs Lab 04/05/15 1815  WBC 5.3  NEUTROABS 2.8  HGB 12.6  HCT 37.7  MCV 91.3  PLT 241   Cardiac Enzymes: No results for input(s): CKTOTAL, CKMB, CKMBINDEX, TROPONINI in the last 168 hours. BNP: BNP (last 3 results) No results for input(s): BNP in the last 8760 hours.  ProBNP (last 3 results) No results for input(s): PROBNP in the last 8760 hours.  CBG:  Recent Labs Lab 04/07/15 1633 04/07/15 2032 04/08/15 0730 04/08/15 1155 04/08/15 1637  GLUCAP 102* 105* 186* 223* 171*       Signed:  Bryah Ocheltree  [facility] 04/08/2015, 6:51 PM  Attending note:  Patient seen and examined. Note reviewed as per Toya Smothers,  NP.  This patient was admitted to the hospital with persistent headache and left-sided weakness. CT scan as well as MRI of the brain was negative for acute infarct. She does report a history of multiple sclerosis, but reports that she is not taken any medication 15 years. She was  seen by neurology who ordered MRI of the C-spine which did not show any acute findings. He recommended giving the patient a course of steroid-induced, Fioricet and following up with neurology in 6 weeks. She's not appear to have any significant deficits at this time. Patient will be discharged home to follow up with neurology.  Henny Strauch

## 2015-04-08 NOTE — Care Management Note (Signed)
Case Management Note  Patient Details  Name: KEYRA VIRELLA MRN: [id number] Date of Birth: [birth year]  Subjective/Objective:                    Action/Plan:   Expected Discharge Date:  04/08/15               Expected Discharge Plan:  Home/Self Care  In-House Referral:  NA  Discharge planning Services  CM Consult  Post Acute Care Choice:  NA Choice offered to:  NA  DME Arranged:    DME Agency:     HH Arranged:    HH Agency:     Status of Service:  Completed, signed off  Medicare Important Message Given:  No Date Medicare IM Given:    Medicare IM give by:    Date Additional Medicare IM Given:    Additional Medicare Important Message give by:     If discussed at Long Length of Stay Meetings, dates discussed:    Additional Comments: Pt discharged home today. Outpt PT has been arranged. No further CM needs noted. Arlyss Queen [place], RN 04/08/2015, 2:48 PM

## 2015-04-12 ENCOUNTER — Emergency Department (HOSPITAL_COMMUNITY): Payer: 59

## 2015-04-12 ENCOUNTER — Observation Stay (HOSPITAL_COMMUNITY)
Admission: EM | Admit: 2015-04-12 | Discharge: 2015-04-13 | Disposition: A | Discharge status: Home / Self Care | Payer: 59 | Attending: Internal Medicine | Admitting: Internal Medicine

## 2015-04-12 ENCOUNTER — Encounter (HOSPITAL_COMMUNITY): Payer: Self-pay | Admitting: Emergency Medicine

## 2015-04-12 DIAGNOSIS — R079 Chest pain, unspecified: Secondary | ICD-10-CM | POA: Diagnosis present

## 2015-04-12 DIAGNOSIS — K219 Gastro-esophageal reflux disease without esophagitis: Secondary | ICD-10-CM | POA: Insufficient documentation

## 2015-04-12 DIAGNOSIS — M797 Fibromyalgia: Secondary | ICD-10-CM | POA: Insufficient documentation

## 2015-04-12 DIAGNOSIS — Z79899 Other long term (current) drug therapy: Secondary | ICD-10-CM | POA: Diagnosis not present

## 2015-04-12 DIAGNOSIS — Z8673 Personal history of transient ischemic attack (TIA), and cerebral infarction without residual deficits: Secondary | ICD-10-CM | POA: Insufficient documentation

## 2015-04-12 DIAGNOSIS — M810 Age-related osteoporosis without current pathological fracture: Secondary | ICD-10-CM | POA: Insufficient documentation

## 2015-04-12 DIAGNOSIS — I10 Essential (primary) hypertension: Secondary | ICD-10-CM | POA: Diagnosis present

## 2015-04-12 DIAGNOSIS — F329 Major depressive disorder, single episode, unspecified: Secondary | ICD-10-CM | POA: Insufficient documentation

## 2015-04-12 DIAGNOSIS — J45909 Unspecified asthma, uncomplicated: Secondary | ICD-10-CM | POA: Diagnosis not present

## 2015-04-12 DIAGNOSIS — R0789 Other chest pain: Secondary | ICD-10-CM

## 2015-04-12 DIAGNOSIS — Z7951 Long term (current) use of inhaled steroids: Secondary | ICD-10-CM | POA: Diagnosis not present

## 2015-04-12 DIAGNOSIS — G4730 Sleep apnea, unspecified: Secondary | ICD-10-CM | POA: Diagnosis not present

## 2015-04-12 DIAGNOSIS — I509 Heart failure, unspecified: Secondary | ICD-10-CM | POA: Insufficient documentation

## 2015-04-12 DIAGNOSIS — G894 Chronic pain syndrome: Secondary | ICD-10-CM | POA: Diagnosis not present

## 2015-04-12 DIAGNOSIS — K649 Unspecified hemorrhoids: Secondary | ICD-10-CM | POA: Diagnosis not present

## 2015-04-12 DIAGNOSIS — G4700 Insomnia, unspecified: Secondary | ICD-10-CM | POA: Insufficient documentation

## 2015-04-12 DIAGNOSIS — G629 Polyneuropathy, unspecified: Secondary | ICD-10-CM | POA: Insufficient documentation

## 2015-04-12 DIAGNOSIS — K449 Diaphragmatic hernia without obstruction or gangrene: Secondary | ICD-10-CM | POA: Insufficient documentation

## 2015-04-12 DIAGNOSIS — Z7902 Long term (current) use of antithrombotics/antiplatelets: Secondary | ICD-10-CM | POA: Diagnosis not present

## 2015-04-12 DIAGNOSIS — Z720 Tobacco use: Secondary | ICD-10-CM | POA: Insufficient documentation

## 2015-04-12 DIAGNOSIS — Z880 Allergy status to penicillin: Secondary | ICD-10-CM | POA: Diagnosis not present

## 2015-04-12 DIAGNOSIS — E039 Hypothyroidism, unspecified: Secondary | ICD-10-CM | POA: Diagnosis not present

## 2015-04-12 DIAGNOSIS — M1000 Idiopathic gout, unspecified site: Secondary | ICD-10-CM | POA: Insufficient documentation

## 2015-04-12 DIAGNOSIS — M62838 Other muscle spasm: Secondary | ICD-10-CM | POA: Diagnosis not present

## 2015-04-12 DIAGNOSIS — I809 Phlebitis and thrombophlebitis of unspecified site: Secondary | ICD-10-CM | POA: Insufficient documentation

## 2015-04-12 DIAGNOSIS — E785 Hyperlipidemia, unspecified: Secondary | ICD-10-CM | POA: Diagnosis present

## 2015-04-12 DIAGNOSIS — G35 Multiple sclerosis: Secondary | ICD-10-CM | POA: Insufficient documentation

## 2015-04-12 DIAGNOSIS — F419 Anxiety disorder, unspecified: Secondary | ICD-10-CM | POA: Diagnosis not present

## 2015-04-12 DIAGNOSIS — M109 Gout, unspecified: Secondary | ICD-10-CM | POA: Insufficient documentation

## 2015-04-12 DIAGNOSIS — J441 Chronic obstructive pulmonary disease with (acute) exacerbation: Secondary | ICD-10-CM | POA: Diagnosis not present

## 2015-04-12 DIAGNOSIS — Z8701 Personal history of pneumonia (recurrent): Secondary | ICD-10-CM | POA: Diagnosis not present

## 2015-04-12 DIAGNOSIS — G8929 Other chronic pain: Secondary | ICD-10-CM | POA: Diagnosis not present

## 2015-04-12 DIAGNOSIS — K2970 Gastritis, unspecified, without bleeding: Secondary | ICD-10-CM | POA: Diagnosis not present

## 2015-04-12 DIAGNOSIS — G43909 Migraine, unspecified, not intractable, without status migrainosus: Secondary | ICD-10-CM | POA: Insufficient documentation

## 2015-04-12 DIAGNOSIS — R0602 Shortness of breath: Secondary | ICD-10-CM | POA: Diagnosis present

## 2015-04-12 LAB — URINALYSIS, ROUTINE W REFLEX MICROSCOPIC
BILIRUBIN URINE: NEGATIVE
GLUCOSE, UA: NEGATIVE mg/dL
Hgb urine dipstick: NEGATIVE
Ketones, ur: NEGATIVE mg/dL
Leukocytes, UA: NEGATIVE
Nitrite: NEGATIVE
PH: 6.5 (ref 5.0–8.0)
Protein, ur: NEGATIVE mg/dL
SPECIFIC GRAVITY, URINE: 1.015 (ref 1.005–1.030)
Urobilinogen, UA: 0.2 mg/dL (ref 0.0–1.0)

## 2015-04-12 LAB — COMPREHENSIVE METABOLIC PANEL
ALBUMIN: 4.2 g/dL (ref 3.5–5.0)
ALT: 22 U/L (ref 14–54)
ANION GAP: 10 (ref 5–15)
AST: 15 U/L (ref 15–41)
Alkaline Phosphatase: 67 U/L (ref 38–126)
BUN: 16 mg/dL (ref 6–20)
CALCIUM: 9 mg/dL (ref 8.9–10.3)
CO2: 27 mmol/L (ref 22–32)
Chloride: 104 mmol/L (ref 101–111)
Creatinine, Ser: 0.83 mg/dL (ref 0.44–1.00)
GFR calc non Af Amer: >60 mL/min (ref >60)
GLUCOSE: 165 mg/dL — AB (ref 65–99)
Potassium: 3.4 mmol/L — ABNORMAL LOW (ref 3.5–5.1)
Sodium: 141 mmol/L (ref 135–145)
Total Bilirubin: 0.5 mg/dL (ref 0.3–1.2)
Total Protein: 7.1 g/dL (ref 6.5–8.1)

## 2015-04-12 LAB — BRAIN NATRIURETIC PEPTIDE: B Natriuretic Peptide: 34 pg/mL (ref 0.0–100.0)

## 2015-04-12 LAB — CBC
HEMATOCRIT: 36.9 % (ref 36.0–46.0)
Hemoglobin: 12.4 g/dL (ref 12.0–15.0)
MCH: 30.7 pg (ref 26.0–34.0)
MCHC: 33.6 g/dL (ref 30.0–36.0)
MCV: 91.3 fL (ref 78.0–100.0)
PLATELETS: 235 10*3/uL (ref 150–400)
RBC: 4.04 MIL/uL (ref 3.87–5.11)
RDW: 15.2 % (ref 11.5–15.5)
WBC: 7.1 10*3/uL (ref 4.0–10.5)

## 2015-04-12 LAB — TROPONIN I: Troponin I: <0.03 ng/mL (ref <0.031)

## 2015-04-12 MED ORDER — LORAZEPAM 2 MG/ML IJ SOLN
1.0000 mg | Freq: Once | INTRAMUSCULAR | Status: AC
Start: 2015-04-12 — End: 2015-04-12
  Administered 2015-04-12: 1 mg via INTRAVENOUS
  Filled 2015-04-12: qty 1

## 2015-04-12 MED ORDER — ALPRAZOLAM 1 MG PO TABS
1.0000 mg | ORAL_TABLET | Freq: Two times a day (BID) | ORAL | Status: DC
  Administered 2015-04-12 – 2015-04-13 (×2): 1 mg via ORAL
  Filled 2015-04-12 (×2): qty 1

## 2015-04-12 MED ORDER — METHYLPREDNISOLONE SODIUM SUCC 125 MG IJ SOLR
80.0000 mg | Freq: Two times a day (BID) | INTRAMUSCULAR | Status: DC
  Administered 2015-04-13: 80 mg via INTRAVENOUS
  Filled 2015-04-12: qty 2

## 2015-04-12 MED ORDER — SODIUM CHLORIDE 0.9 % IJ SOLN
3.0000 mL | Freq: Two times a day (BID) | INTRAMUSCULAR | Status: DC
  Administered 2015-04-12: 3 mL via INTRAVENOUS

## 2015-04-12 MED ORDER — CYCLOBENZAPRINE HCL 10 MG PO TABS
10.0000 mg | ORAL_TABLET | Freq: Three times a day (TID) | ORAL | Status: DC | PRN
  Administered 2015-04-13: 10 mg via ORAL
  Filled 2015-04-12: qty 1

## 2015-04-12 MED ORDER — ENSURE ENLIVE PO LIQD: 237.0000 mL | Freq: Two times a day (BID) | ORAL | Status: DC

## 2015-04-12 MED ORDER — ALUM & MAG HYDROXIDE-SIMETH 200-200-20 MG/5ML PO SUSP
30.0000 mL | Freq: Four times a day (QID) | ORAL | Status: DC | PRN
  Administered 2015-04-13: 30 mL via ORAL
  Filled 2015-04-12: qty 30

## 2015-04-12 MED ORDER — DICYCLOMINE HCL 20 MG PO TABS
20.0000 mg | ORAL_TABLET | Freq: Three times a day (TID) | ORAL | Status: DC
  Filled 2015-04-12 (×2): qty 1

## 2015-04-12 MED ORDER — CITALOPRAM HYDROBROMIDE 20 MG PO TABS
40.0000 mg | ORAL_TABLET | Freq: Every day | ORAL | Status: DC
  Administered 2015-04-13: 40 mg via ORAL
  Filled 2015-04-12: qty 2

## 2015-04-12 MED ORDER — PROMETHAZINE HCL 12.5 MG PO TABS: 12.5000 mg | ORAL_TABLET | Freq: Four times a day (QID) | ORAL | Status: DC | PRN

## 2015-04-12 MED ORDER — IPRATROPIUM-ALBUTEROL 0.5-2.5 (3) MG/3ML IN SOLN
3.0000 mL | Freq: Once | RESPIRATORY_TRACT | Status: AC
  Administered 2015-04-12: 3 mL via RESPIRATORY_TRACT
  Filled 2015-04-12: qty 3

## 2015-04-12 MED ORDER — FUROSEMIDE 20 MG PO TABS
20.0000 mg | ORAL_TABLET | Freq: Every day | ORAL | Status: DC
  Administered 2015-04-13: 20 mg via ORAL
  Filled 2015-04-12: qty 1

## 2015-04-12 MED ORDER — FENTANYL CITRATE (PF) 100 MCG/2ML IJ SOLN: 50.0000 ug | INTRAMUSCULAR | Status: DC | PRN

## 2015-04-12 MED ORDER — ALBUTEROL SULFATE (2.5 MG/3ML) 0.083% IN NEBU: 2.5000 mg | INHALATION_SOLUTION | RESPIRATORY_TRACT | Status: DC

## 2015-04-12 MED ORDER — GI COCKTAIL ~~LOC~~
30.0000 mL | Freq: Once | ORAL | Status: AC
  Administered 2015-04-12: 30 mL via ORAL
  Filled 2015-04-12: qty 30

## 2015-04-12 MED ORDER — LISINOPRIL 10 MG PO TABS
40.0000 mg | ORAL_TABLET | Freq: Every day | ORAL | Status: DC
  Administered 2015-04-13: 40 mg via ORAL
  Filled 2015-04-12: qty 4

## 2015-04-12 MED ORDER — AZITHROMYCIN 250 MG PO TABS
250.0000 mg | ORAL_TABLET | Freq: Every day | ORAL | Status: DC
Start: 2015-04-14 — End: 2015-04-13

## 2015-04-12 MED ORDER — METHYLPREDNISOLONE SODIUM SUCC 125 MG IJ SOLR
125.0000 mg | Freq: Once | INTRAMUSCULAR | Status: AC
  Administered 2015-04-12: 125 mg via INTRAVENOUS
  Filled 2015-04-12: qty 2

## 2015-04-12 MED ORDER — ALBUTEROL SULFATE (2.5 MG/3ML) 0.083% IN NEBU
5.0000 mg | INHALATION_SOLUTION | Freq: Once | RESPIRATORY_TRACT | Status: AC
  Administered 2015-04-12: 5 mg via RESPIRATORY_TRACT
  Filled 2015-04-12: qty 6

## 2015-04-12 MED ORDER — FENTANYL CITRATE (PF) 100 MCG/2ML IJ SOLN
50.0000 ug | Freq: Once | INTRAMUSCULAR | Status: AC
  Administered 2015-04-12: 50 ug via INTRAVENOUS
  Filled 2015-04-12: qty 2

## 2015-04-12 MED ORDER — ALBUTEROL SULFATE (2.5 MG/3ML) 0.083% IN NEBU
2.5000 mg | INHALATION_SOLUTION | Freq: Four times a day (QID) | RESPIRATORY_TRACT | Status: DC
Start: 2015-04-13 — End: 2015-04-12

## 2015-04-12 MED ORDER — FAMOTIDINE 20 MG PO TABS: 40.0000 mg | ORAL_TABLET | Freq: Every day | ORAL | Status: DC

## 2015-04-12 MED ORDER — SODIUM CHLORIDE 0.9 % IJ SOLN
3.0000 mL | Freq: Two times a day (BID) | INTRAMUSCULAR | Status: DC
  Administered 2015-04-13: 3 mL via INTRAVENOUS

## 2015-04-12 MED ORDER — GUAIFENESIN ER 600 MG PO TB12
600.0000 mg | ORAL_TABLET | Freq: Two times a day (BID) | ORAL | Status: DC
  Administered 2015-04-12 – 2015-04-13 (×2): 600 mg via ORAL
  Filled 2015-04-12 (×2): qty 1

## 2015-04-12 MED ORDER — IPRATROPIUM-ALBUTEROL 0.5-2.5 (3) MG/3ML IN SOLN
3.0000 mL | Freq: Four times a day (QID) | RESPIRATORY_TRACT | Status: DC
  Administered 2015-04-13: 3 mL via RESPIRATORY_TRACT
  Filled 2015-04-12 (×2): qty 3

## 2015-04-12 MED ORDER — LEVOTHYROXINE SODIUM 88 MCG PO TABS
88.0000 ug | ORAL_TABLET | Freq: Every day | ORAL | Status: DC
  Administered 2015-04-13: 88 ug via ORAL
  Filled 2015-04-12: qty 1

## 2015-04-12 MED ORDER — SODIUM CHLORIDE 0.9 % IV SOLN
INTRAVENOUS | Status: DC
Start: 2015-04-12 — End: 2015-04-12

## 2015-04-12 MED ORDER — SODIUM CHLORIDE 0.9 % IJ SOLN: 3.0000 mL | INTRAMUSCULAR | Status: DC | PRN

## 2015-04-12 MED ORDER — SODIUM CHLORIDE 0.9 % IV SOLN: 250.0000 mL | INTRAVENOUS | Status: DC | PRN

## 2015-04-12 MED ORDER — BUTALBITAL-APAP-CAFFEINE 50-325-40 MG PO TABS
2.0000 | ORAL_TABLET | ORAL | Status: DC | PRN
  Administered 2015-04-13: 2 via ORAL
  Filled 2015-04-12 (×2): qty 2

## 2015-04-12 MED ORDER — ENSURE ENLIVE PO LIQD
237.0000 mL | Freq: Two times a day (BID) | ORAL | Status: DC
  Administered 2015-04-13: 237 mL via ORAL

## 2015-04-12 MED ORDER — PANTOPRAZOLE SODIUM 40 MG PO TBEC
40.0000 mg | DELAYED_RELEASE_TABLET | Freq: Every day | ORAL | Status: DC
  Administered 2015-04-12 – 2015-04-13 (×2): 40 mg via ORAL
  Filled 2015-04-12 (×2): qty 1

## 2015-04-12 MED ORDER — ALBUTEROL SULFATE (2.5 MG/3ML) 0.083% IN NEBU: 2.5000 mg | INHALATION_SOLUTION | RESPIRATORY_TRACT | Status: DC | PRN

## 2015-04-12 MED ORDER — IPRATROPIUM BROMIDE 0.02 % IN SOLN: 0.5000 mg | Freq: Four times a day (QID) | RESPIRATORY_TRACT | Status: DC

## 2015-04-12 MED ORDER — AZITHROMYCIN 250 MG PO TABS
500.0000 mg | ORAL_TABLET | Freq: Every day | ORAL | Status: AC
  Administered 2015-04-13: 500 mg via ORAL
  Filled 2015-04-12: qty 2

## 2015-04-12 MED ORDER — CLOPIDOGREL BISULFATE 75 MG PO TABS
75.0000 mg | ORAL_TABLET | Freq: Every day | ORAL | Status: DC
  Administered 2015-04-13: 75 mg via ORAL
  Filled 2015-04-12: qty 1

## 2015-04-12 NOTE — ED Notes (Signed)
Patient states that her left side is hurting, states ' feels like its drawing'. Asking for pain medication at this time. Will make RN aware.

## 2015-04-12 NOTE — H&P (Signed)
PCP:   [name], MD   Chief Complaint:  sob  HPI: 54 yo female h/o copd, htn, fibromyalgia, chronic pain, migraine headaches comes in with one day of sob and wheezing.  Denies any fevers.  Just was discharged this past week for stroke w/u, but was complex migraine headaches.  She denies any n/v/d.  Coughing dry nonproductive.  No le edema or swelling.  Sob not improved with home inhalers and nebulizers.  Asked to obs patient for copde.  Not hypoxic.  No oxygen supplemental requirements at home.  Review of Systems:  Positive and negative as per HPI otherwise all other systems are negative  Past Medical History: Past Medical History  Diagnosis Date  . Gastritis   . Fibromyalgia   . Edema of lower extremity   . DDD (degenerative disc disease)   . MS (multiple sclerosis)     early stages  . Neuropathy   . COPD (chronic obstructive pulmonary disease)   . Hiatal hernia   . Gout   . TIA (transient ischemic attack)     Hx: of  . Asthma     Albuterol prn   . Depression     takes Celexa,Clonazepam daily  . Muscle spasm     takes Flexeril daily as needed  . GERD (gastroesophageal reflux disease)     takes Pepcid daily  . CHF (congestive heart failure)     takes Furosemide daily  . Hypothyroidism     takes Synthroid daily  . HTN (hypertension)     takes lisinopril daily  . Hyperlipidemia     but doesn't take any meds  . Phlebitis   . Emphysema lung   . Shortness of breath     with exertion or stressed   . Pneumonia     hx of;last time in 2014  . History of bronchitis     last time 4-63months ago   . Migraine     has one now  . Migraine   . Stroke     left sided weakness  . Joint pain   . Joint swelling   . Chronic back pain     DDD/scoliosis  . Osteoporosis   . Hemorrhoids   . History of colon polyps   . Urinary frequency   . History of kidney stones     is in her left kidney and has been for couple of yrs and no problems  . Diabetes mellitus     borderline   . Insomnia     doesn't take any meds for this  . Gout of big toe     RIGHT  . Complication of anesthesia     hard to wake up  . PONV (postoperative nausea and vomiting)   . Sleep apnea     sleep study done at least 76yrs ago;has CPAP but doesn't use  . Anxiety    Past Surgical History  Procedure Laterality Date  . Fundic gland polyp      benign  . Sigmoidoscopy  01/31/08    large internal hemorrhoids/small rectal polyp removed/rare sigmoid diverticula  . Esophagogastroduodenoscopy  10/29/07    normal  . Cholecystectomy    . Bladder suspension    . Abdominal hysterectomy    . Carpal tunnel release Right   . Cardiac catheterization    . Colonoscopy    . Anterior cervical decomp/discectomy fusion N/A 12/30/2013    Procedure: ANTERIOR CERVICAL DISCECTOMY FUSION C4-5, C5-6, plate and screws, allograft, local bone graft;  Surgeon: Kerrin Champagne, MD;  Location: [facility] OR;  Service: Orthopedics;  Laterality: N/A;  . Back surgery      lumbar discectomy  . Colonoscopy with propofol N/A 01/20/2015    Procedure: COLONOSCOPY WITH PROPOFOL;  Surgeon: West Bali, MD;  Location: [facility] ORS;  Service: Endoscopy;  Laterality: N/A;  In cecum @ 8:54, out @  9:15  . Esophageal biopsy N/A 01/20/2015    Procedure: COLON BIOPSY;  Surgeon: West Bali, MD;  Location: [facility] ORS;  Service: Endoscopy;  Laterality: N/A;  . Polypectomy N/A 01/20/2015    Procedure: RECTAL POLYPECTOMY;  Surgeon: West Bali, MD;  Location: [facility] ORS;  Service: Endoscopy;  Laterality: N/A;    Medications: Prior to Admission medications   Medication Sig Start Date End Date Taking? Authorizing Provider  albuterol (PROVENTIL HFA;VENTOLIN HFA) 108 (90 BASE) MCG/ACT inhaler Inhale 2 puffs into the lungs every 6 (six) hours as needed. Shortness of breath.   Yes Historical Provider, MD  ALPRAZolam Prudy Feeler) 1 MG tablet Take 1 mg by mouth 2 (two) times daily.    Yes Historical Provider, MD  butalbital-acetaminophen-caffeine (FIORICET, ESGIC)  50-325-40 MG per tablet Take 2 tablets by mouth every 4 (four) hours as needed for headache. 04/08/15  Yes Lesle Chris Black, NP  citalopram (CELEXA) 40 MG tablet Take 40 mg by mouth daily.   Yes Historical Provider, MD  clopidogrel (PLAVIX) 75 MG tablet Take 1 tablet (75 mg total) by mouth daily. 04/08/15  Yes Lesle Chris Black, NP  cyclobenzaprine (FLEXERIL) 10 MG tablet Take 10 mg by mouth 3 (three) times daily as needed for muscle spasms.   Yes Historical Provider, MD  dicyclomine (BENTYL) 20 MG tablet Take 1 tablet (20 mg total) by mouth 4 (four) times daily -  before meals and at bedtime. 01/20/15  Yes West Bali, MD  famotidine (PEPCID) 20 MG tablet Take 2 tablets (40 mg total) by mouth daily. 04/08/15  Yes Lesle Chris Black, NP  Fluticasone Furoate-Vilanterol (BREO ELLIPTA) 100-25 MCG/INH AEPB Inhale 1 puff into the lungs daily.   Yes Historical Provider, MD  furosemide (LASIX) 20 MG tablet Take 20 mg by mouth daily.   Yes Historical Provider, MD  levothyroxine (SYNTHROID, LEVOTHROID) 88 MCG tablet Take 88 mcg by mouth daily before breakfast.   Yes Historical Provider, MD  lisinopril (PRINIVIL,ZESTRIL) 40 MG tablet Take 40 mg by mouth daily.   Yes Historical Provider, MD  predniSONE (DELTASONE) 20 MG tablet Take 2 tabs for 3 days then stop 04/08/15  Yes Gwenyth Bender, NP    Allergies:   Allergies  Allergen Reactions  . Demerol [Meperidine] Anaphylaxis and Swelling  . Imitrex [Sumatriptan] Anaphylaxis  . Penicillins Anaphylaxis and Swelling  . Sulfa Antibiotics Anaphylaxis  . Levaquin [Levofloxacin In D5w]     Abdominal pain  . Neurontin [Gabapentin] Other (See Comments)    Abdominal pain  . Toradol [Ketorolac Tromethamine] Other (See Comments)    "Feels like my whole body is on fire."  . Tramadol Other (See Comments)    Stomach upset   . Doxycycline Nausea And Vomiting         Social History:  reports that she has been smoking Cigarettes.  She has a 7.5 pack-year smoking history. She has  never used smokeless tobacco. She reports that she does not drink alcohol or use illicit drugs.  Family History: Family History  Problem Relation Age of Onset  . Diabetes Mother   . Hypertension Mother   .  Arthritis Father   . Diabetes Sister   . Neuropathy Sister   . Transient ischemic attack Sister   . Heart disease Sister   . Hypertension Sister   . Cancer - Lung Other   . Cancer - Prostate Other   . Cancer - Other Other   . Colon cancer Paternal Uncle   . Colon cancer Brother     deceased age 45 from colon CA and brain CA    Physical Exam: Filed Vitals:   04/12/15 1930 04/12/15 2000 04/12/15 2030 04/12/15 2130  BP: 171/84 160/94 152/77 152/77  Pulse: 90 94 85 86  Temp:      TempSrc:      Resp: 11 15 19 18   Height:      Weight:      SpO2: 100% 100% 97% 98%   General appearance: alert, cooperative and no distress Head: Normocephalic, without obvious abnormality, atraumatic Eyes: negative Nose: Nares normal. Septum midline. Mucosa normal. No drainage or sinus tenderness. Neck: no JVD and supple, symmetrical, trachea midline Lungs: diminished breath sounds bilaterally Heart: regular rate and rhythm, S1, S2 normal, no murmur, click, rub or gallop Abdomen: soft, non-tender; bowel sounds normal; no masses,  no organomegaly Extremities: extremities normal, atraumatic, no cyanosis or edema Pulses: 2+ and symmetric Skin: Skin color, texture, turgor normal. No rashes or lesions Neurologic: Grossly normal    Labs on Admission:   Recent Labs  04/12/15 2034  NA 141  K 3.4*  CL 104  CO2 27  GLUCOSE 165*  BUN 16  CREATININE 0.83  CALCIUM 9.0    Recent Labs  04/12/15 2034  AST 15  ALT 22  ALKPHOS 67  BILITOT 0.5  PROT 7.1  ALBUMIN 4.2    Recent Labs  04/12/15 1800  WBC 7.1  HGB 12.4  HCT 36.9  MCV 91.3  PLT 235    Recent Labs  04/12/15 2034  TROPONINI <0.03   Radiological Exams on Admission: Dg Chest Portable 1 View  04/12/2015   CLINICAL  DATA:  Chest pain.  EXAM: PORTABLE CHEST - 1 VIEW  COMPARISON:  04/05/2015  FINDINGS: The heart size and mediastinal contours are within normal limits. Both lungs are clear. The visualized skeletal structures are unremarkable.  IMPRESSION: Normal exam.   Electronically Signed   By: Francene Boyers M.D.   On: 04/12/2015 19:10   Old records reviewed 12 lead ekg reviewed nsr no acute changes poor quality due to artifact, will repeat cxr reviewed no infiltate no edema Case discussed with edp dr lockwood  Assessment/Plan  54 yo female with  Mild copde  Principal Problem:   COPD exacerbation-  Iv solumedrol.  freq alb and atrovent nebs.  Mucinex.  Ambulate q shift and measure oxygen sats.  Zpack.  Afvss.  Oxygen sat normal on RA.    Active Problems:   Hypertension- stable   Chronic pain syndrome   Hyperlipidemia   Feeling of chest tightness- actually epigastric not relieved with gi cocktail, will give protonix.  Serial trop.  Repeat ekg.   SOB (shortness of breath)  obs on tele.  Full code.  Suspect her chronic pain issues will be difficult to manage, reports her migraine headache has not gotten better since she left, give fiorecet, limit controlled substances if possible.  Clemma Johnsen A 04/12/2015, 9:43 PM

## 2015-04-12 NOTE — ED Provider Notes (Signed)
[name]: [id number]     Arrival date & time 04/12/15  1833 History   First MD Initiated Contact with Patient 04/12/15 1841     Chief Complaint  Patient presents with  . Chest Pain     (Consider location/radiation/quality/duration/timing/severity/associated sxs/prior Treatment) HPI Patient presents with concern of dyspnea, chest pain. Symptoms began about one hour prior to ED arrival, when the patient was walking into church. Since that time she has had persistent dyspnea, chest tightness. No relief from anything, and the patient has not tried proper dilator or any other therapy thus far. She was well prior to the onset of symptoms. No recent illness, fever, nausea, vomiting, diarrhea. No current lightheadedness, syncope, fever, confusion, disorientation. Patient states that she takes all medication as directed, has a history of CHF, COPD. Past Medical History  Diagnosis Date  . Gastritis   . Fibromyalgia   . Edema of lower extremity   . DDD (degenerative disc disease)   . MS (multiple sclerosis)     early stages  . Neuropathy   . COPD (chronic obstructive pulmonary disease)   . Hiatal hernia   . Gout   . TIA (transient ischemic attack)     Hx: of  . Asthma     Albuterol prn   . Depression     takes Celexa,Clonazepam daily  . Muscle spasm     takes Flexeril daily as needed  . GERD (gastroesophageal reflux disease)     takes Pepcid daily  . CHF (congestive heart failure)     takes Furosemide daily  . Hypothyroidism     takes Synthroid daily  . HTN (hypertension)     takes lisinopril daily  . Hyperlipidemia     but doesn't take any meds  . Phlebitis   . Emphysema lung   . Shortness of breath     with exertion or stressed   . Pneumonia     hx of;last time in 2014  . History of bronchitis     last time 4-73months ago   . Migraine     has one now  . Migraine   . Stroke     left sided weakness  . Joint pain   . Joint swelling   . Chronic back pain     DDD/scoliosis   . Osteoporosis   . Hemorrhoids   . History of colon polyps   . Urinary frequency   . History of kidney stones     is in her left kidney and has been for couple of yrs and no problems  . Diabetes mellitus     borderline  . Insomnia     doesn't take any meds for this  . Gout of big toe     RIGHT  . Complication of anesthesia     hard to wake up  . PONV (postoperative nausea and vomiting)   . Sleep apnea     sleep study done at least 15yrs ago;has CPAP but doesn't use  . Anxiety    Past Surgical History  Procedure Laterality Date  . Fundic gland polyp      benign  . Sigmoidoscopy  01/31/08    large internal hemorrhoids/small rectal polyp removed/rare sigmoid diverticula  . Esophagogastroduodenoscopy  10/29/07    normal  . Cholecystectomy    . Bladder suspension    . Abdominal hysterectomy    . Carpal tunnel release Right   . Cardiac catheterization    . Colonoscopy    . Anterior  cervical decomp/discectomy fusion N/A 12/30/2013    Procedure: ANTERIOR CERVICAL DISCECTOMY FUSION C4-5, C5-6, plate and screws, allograft, local bone graft;  Surgeon: Kerrin Champagne, MD;  Location: [facility] OR;  Service: Orthopedics;  Laterality: N/A;  . Back surgery      lumbar discectomy  . Colonoscopy with propofol N/A 01/20/2015    Procedure: COLONOSCOPY WITH PROPOFOL;  Surgeon: West Bali, MD;  Location: [facility];  Service: Endoscopy;  Laterality: N/A;  In cecum @ 8:54, out @  9:15  . Esophageal biopsy N/A 01/20/2015    Procedure: COLON BIOPSY;  Surgeon: West Bali, MD;  Location: [facility];  Service: Endoscopy;  Laterality: N/A;  . Polypectomy N/A 01/20/2015    Procedure: RECTAL POLYPECTOMY;  Surgeon: West Bali, MD;  Location: [facility];  Service: Endoscopy;  Laterality: N/A;   Family History  Problem Relation Age of Onset  . Diabetes Mother   . Hypertension Mother   . Arthritis Father   . Diabetes Sister   . Neuropathy Sister   . Transient ischemic attack Sister   . Heart disease Sister   .  Hypertension Sister   . Cancer - Lung Other   . Cancer - Prostate Other   . Cancer - Other Other   . Colon cancer Paternal Uncle   . Colon cancer Brother     deceased age 35 from colon CA and brain CA   History  Substance Use Topics  . Smoking status: Current Some Day Smoker -- 0.50 packs/day for 15 years    Types: Cigarettes  . Smokeless tobacco: Never Used  . Alcohol Use: No   OB History    Gravida Para Term Preterm AB TAB SAB Ectopic Multiple Living   2 2 0 2      2     Review of Systems  Constitutional:       Per HPI, otherwise negative  HENT:       Per HPI, otherwise negative  Respiratory:       Per HPI, otherwise negative  Cardiovascular:       Per HPI, otherwise negative  Gastrointestinal: Negative for vomiting.  Endocrine:       Negative aside from HPI  Genitourinary:       Neg aside from HPI   Musculoskeletal:       Per HPI, otherwise negative  Skin: Negative.   Neurological: Positive for weakness. Negative for syncope.       Persistent left-sided weakness since a recent admission      Allergies  Demerol; Imitrex; Penicillins; Sulfa antibiotics; Levaquin; Neurontin; Toradol; Tramadol; and Doxycycline  Home Medications   Prior to Admission medications   Medication Sig Start Date End Date Taking? Authorizing Provider  albuterol (PROVENTIL HFA;VENTOLIN HFA) 108 (90 BASE) MCG/ACT inhaler Inhale 2 puffs into the lungs every 6 (six) hours as needed. Shortness of breath.    Historical Provider, MD  ALPRAZolam Prudy Feeler) 1 MG tablet Take 1 mg by mouth 2 (two) times daily.     Historical Provider, MD  butalbital-acetaminophen-caffeine (FIORICET, ESGIC) 50-325-40 MG per tablet Take 2 tablets by mouth every 4 (four) hours as needed for headache. 04/08/15   Gwenyth Bender, NP  citalopram (CELEXA) 40 MG tablet Take 40 mg by mouth daily.    Historical Provider, MD  clopidogrel (PLAVIX) 75 MG tablet Take 1 tablet (75 mg total) by mouth daily. 04/08/15   Gwenyth Bender, NP   cyclobenzaprine (FLEXERIL) 10 MG tablet Take 10 mg  by mouth 3 (three) times daily as needed for muscle spasms.    Historical Provider, MD  dicyclomine (BENTYL) 20 MG tablet Take 1 tablet (20 mg total) by mouth 4 (four) times daily -  before meals and at bedtime. 01/20/15   West Bali, MD  famotidine (PEPCID) 20 MG tablet Take 2 tablets (40 mg total) by mouth daily. 04/08/15   Gwenyth Bender, NP  Fluticasone Furoate-Vilanterol (BREO ELLIPTA) 100-25 MCG/INH AEPB Inhale 1 puff into the lungs daily.    Historical Provider, MD  furosemide (LASIX) 20 MG tablet Take 20 mg by mouth daily.    Historical Provider, MD  HYDROcodone-acetaminophen (NORCO) 10-325 MG per tablet Take 1 tablet by mouth every 4 (four) hours as needed for moderate pain or severe pain. 11/20/14   Erick Blinks, MD  levothyroxine (SYNTHROID, LEVOTHROID) 88 MCG tablet Take 88 mcg by mouth daily before breakfast.    Historical Provider, MD  lisinopril (PRINIVIL,ZESTRIL) 40 MG tablet Take 40 mg by mouth daily.    Historical Provider, MD  ondansetron (ZOFRAN) 4 MG tablet Take 1 tablet (4 mg total) by mouth every 6 (six) hours as needed for nausea. 11/20/14   Erick Blinks, MD  oxyCODONE-acetaminophen (PERCOCET/ROXICET) 5-325 MG per tablet Take 1-2 tablets by mouth every 6 (six) hours as needed for moderate pain or severe pain. 04/08/15   Erick Blinks, MD  predniSONE (DELTASONE) 20 MG tablet Take 2 tabs for 3 days then stop 04/08/15   Lesle Chris Black, NP   BP 178/106 mmHg  Pulse 90  Temp(Src) 98.7 F (37.1 C) (Oral)  Resp 16  Ht  (1.676 m)  Wt 115 lb (52.164 kg)  BMI 18.57 kg/m2  SpO2 100% Physical Exam  Constitutional: She is oriented to person, place, and time. She appears well-developed and well-nourished. No distress.  HENT:  Head: Normocephalic and atraumatic.  Eyes: Conjunctivae and EOM are normal.  Cardiovascular: Normal rate and regular rhythm.   Pulmonary/Chest: Accessory muscle usage present. No stridor. Tachypnea  noted. She is in respiratory distress. She has decreased breath sounds.  Abdominal: She exhibits no distension.  Musculoskeletal: She exhibits no edema.  Neurological: She is alert and oriented to person, place, and time. No cranial nerve deficit.  Left-sided weakness seems more subjective than objective, with spontaneous motion in both upper and lower extremities, appropriate strength distally.  Skin: Skin is warm and dry.  Psychiatric: She has a normal mood and affect.  Nursing note and vitals reviewed.   ED Course  Procedures (including critical care time) Labs Review Labs Reviewed  COMPREHENSIVE METABOLIC PANEL - Abnormal; Notable for the following:    Potassium 3.4 (*)    Glucose, Bld 165 (*)    All other components within normal limits  CBC  BRAIN NATRIURETIC PEPTIDE  TROPONIN I  URINALYSIS, ROUTINE W REFLEX MICROSCOPIC (NOT AT [facility])    Imaging Review Dg Chest Portable 1 View  04/12/2015   CLINICAL DATA:  Chest pain.  EXAM: PORTABLE CHEST - 1 VIEW  COMPARISON:  04/05/2015  FINDINGS: The heart size and mediastinal contours are within normal limits. Both lungs are clear. The visualized skeletal structures are unremarkable.  IMPRESSION: Normal exam.   Electronically Signed   By: Francene Boyers M.D.   On: 04/12/2015 19:10     EKG Interpretation   Date/Time:  Sunday April 12 2015 18:37:32 EDT Ventricular Rate:  91 PR Interval:  117 QRS Duration: 84 QT Interval:  350 QTC Calculation: 431 R Axis:  62 Text Interpretation:  Sinus rhythm Borderline short PR interval Probable  LVH with secondary repol abnrm Minimal ST elevation, lateral leads Sinus  rhythm Artifact Left ventricular hypertrophy ST-t wave abnormality  Abnormal ekg Confirmed by Gerhard Munch  MD [phone]) on 04/12/2015 7:00:24  PM     Cardiac 90 sinus rhythm normal Pulse ox 97% with nasal cannula abnormal  9:22 PM Patient has improved respiratory functionality, though she continues to complain of some shortness  of breath.  Now after multiple breathing treatments, steroids, fentanyl, Zofran, the patient continues to have chest tightness, difficult to breathing. Labs are reassuring, but with concern for COPD exacerbation, patient will be admitted for further evaluation and management.  MDM   Patient presents several days after discharge, now with chest pain, more accurately described as tightness, dyspnea, and initially was diaphoretic, very uncomfortable appearing. Patient presents to the, but had continued need for nebulizer treatments, steroids. Initial labs not consistent with ongoing coronary ischemia, nor pneumonia, but with concern for ongoing dyspnea, patient was admitted for further evaluation and management.   Gerhard Munch, MD 04/12/15 2129

## 2015-04-12 NOTE — ED Notes (Signed)
Pt c/o chest pain, onset today but became worse approx 30 min PTA. Pt reports centralized chest pressure. Pt had taken ASA throughout the day.

## 2015-04-13 DIAGNOSIS — J441 Chronic obstructive pulmonary disease with (acute) exacerbation: Secondary | ICD-10-CM | POA: Diagnosis not present

## 2015-04-13 LAB — BASIC METABOLIC PANEL
Anion gap: 9 (ref 5–15)
BUN: 17 mg/dL (ref 6–20)
CHLORIDE: 104 mmol/L (ref 101–111)
CO2: 28 mmol/L (ref 22–32)
Calcium: 8.7 mg/dL — ABNORMAL LOW (ref 8.9–10.3)
Creatinine, Ser: 0.79 mg/dL (ref 0.44–1.00)
GFR calc Af Amer: >60 mL/min (ref >60)
GFR calc non Af Amer: >60 mL/min (ref >60)
Glucose, Bld: 162 mg/dL — ABNORMAL HIGH (ref 65–99)
Potassium: 4.1 mmol/L (ref 3.5–5.1)
Sodium: 141 mmol/L (ref 135–145)

## 2015-04-13 LAB — CBC
HEMATOCRIT: 34.2 % — AB (ref 36.0–46.0)
Hemoglobin: 11.5 g/dL — ABNORMAL LOW (ref 12.0–15.0)
MCH: 31.1 pg (ref 26.0–34.0)
MCHC: 33.6 g/dL (ref 30.0–36.0)
MCV: 92.4 fL (ref 78.0–100.0)
PLATELETS: 187 10*3/uL (ref 150–400)
RBC: 3.7 MIL/uL — AB (ref 3.87–5.11)
RDW: 15.3 % (ref 11.5–15.5)
WBC: 4.5 10*3/uL (ref 4.0–10.5)

## 2015-04-13 LAB — TROPONIN I: Troponin I: <0.03 ng/mL (ref <0.031)

## 2015-04-13 MED ORDER — PREDNISONE 10 MG PO TABS: 10.0000 mg | ORAL_TABLET | Freq: Every day | ORAL | Status: DC

## 2015-04-13 MED ORDER — AZITHROMYCIN 250 MG PO TABS: ORAL_TABLET | ORAL | Status: DC

## 2015-04-13 MED ORDER — IBUPROFEN 400 MG PO TABS
400.0000 mg | ORAL_TABLET | Freq: Four times a day (QID) | ORAL | Status: DC | PRN
  Administered 2015-04-13: 400 mg via ORAL
  Filled 2015-04-13: qty 1

## 2015-04-13 MED ORDER — ACETAMINOPHEN 325 MG PO TABS
650.0000 mg | ORAL_TABLET | Freq: Four times a day (QID) | ORAL | Status: DC | PRN
  Administered 2015-04-13: 650 mg via ORAL
  Filled 2015-04-13: qty 2

## 2015-04-13 MED ORDER — DICYCLOMINE HCL 10 MG PO CAPS
20.0000 mg | ORAL_CAPSULE | Freq: Three times a day (TID) | ORAL | Status: DC
  Administered 2015-04-13 (×2): 20 mg via ORAL
  Filled 2015-04-13 (×2): qty 2

## 2015-04-13 NOTE — Progress Notes (Signed)
Discharge instructions given on medications,and follow up visits,patient,and family verbalized understanding. Prescriptions sent with patient. Accompanied by staff by staff to an awaiting vehicle.

## 2015-04-13 NOTE — Discharge Summary (Signed)
Physician Discharge Summary  [name]:[id number] DOB: [birth year] DOA: 04/12/2015  PCP: Nichole Pizza, MD  Admit date: 04/12/2015 Discharge date: 04/13/2015  Time spent: 45 minutes  Recommendations for Outpatient Follow-up:  -Will be discharged home today. -Advised to follow-up with primary care provider in 2 weeks.[name]   Discharge Diagnoses:  Principal Problem:   COPD exacerbation Active Problems:   Hypertension   Chronic pain syndrome   Hyperlipidemia   Feeling of chest tightness   SOB (shortness of breath)   Discharge Condition: Stable and improved  Filed Weights   04/12/15 1840 04/13/15 0503  Weight: 52.164 kg (115 lb) 55.067 kg (121 lb 6.4 oz)    History of present illness:  54 yo female h/o copd, htn, fibromyalgia, chronic pain, migraine headaches comes in with one day of sob and wheezing. Denies any fevers. Just was discharged this past week for stroke w/u, but was complex migraine headaches. She denies any n/v/d. Coughing dry nonproductive. No le edema or swelling. Sob not improved with home inhalers and nebulizers. Asked to obs patient for copde. Not hypoxic. No oxygen supplemental requirements at home.  Hospital Course:   COPD with acute exacerbation -She responded quickly to IV steroids and nebulizer treatments. -We'll discharge her home today on a prednisone taper as well as her home inhalers.  Left sided weakness -Hospitalized earlier this week, completed show workup is negative, presumed to be secondary to a complicated migraine. -Has follow-up with Dr. Gerilyn Pilgrim in 24 hours.  Rest of chronic conditions have been stable.   Procedures:  None   Consultations:  None  Discharge Instructions  Discharge Instructions    Diet - low sodium heart healthy    Complete by:  As directed      Increase activity slowly    Complete by:  As directed             Medication List    TAKE these medications        albuterol 108 (90 BASE) MCG/ACT  inhaler  Commonly known as:  PROVENTIL HFA;VENTOLIN HFA  Inhale 2 puffs into the lungs every 6 (six) hours as needed. Shortness of breath.     ALPRAZolam 1 MG tablet  Commonly known as:  XANAX  Take 1 mg by mouth 2 (two) times daily.     azithromycin 250 MG tablet  Commonly known as:  ZITHROMAX  Take 1 tablet daily  Start taking on:  04/14/2015     BREO ELLIPTA 100-25 MCG/INH Aepb  Generic drug:  Fluticasone Furoate-Vilanterol  Inhale 1 puff into the lungs daily.     butalbital-acetaminophen-caffeine 50-325-40 MG per tablet  Commonly known as:  FIORICET, ESGIC  Take 2 tablets by mouth every 4 (four) hours as needed for headache.     citalopram 40 MG tablet  Commonly known as:  CELEXA  Take 40 mg by mouth daily.     clopidogrel 75 MG tablet  Commonly known as:  PLAVIX  Take 1 tablet (75 mg total) by mouth daily.     cyclobenzaprine 10 MG tablet  Commonly known as:  FLEXERIL  Take 10 mg by mouth 3 (three) times daily as needed for muscle spasms.     dicyclomine 20 MG tablet  Commonly known as:  BENTYL  Take 1 tablet (20 mg total) by mouth 4 (four) times daily -  before meals and at bedtime.     famotidine 20 MG tablet  Commonly known as:  PEPCID  Take 2 tablets (  40 mg total) by mouth daily.     furosemide 20 MG tablet  Commonly known as:  LASIX  Take 20 mg by mouth daily.     levothyroxine 88 MCG tablet  Commonly known as:  SYNTHROID, LEVOTHROID  Take 88 mcg by mouth daily before breakfast.     lisinopril 40 MG tablet  Commonly known as:  PRINIVIL,ZESTRIL  Take 40 mg by mouth daily.     predniSONE 10 MG tablet  Commonly known as:  DELTASONE  Take 1 tablet (10 mg total) by mouth daily with breakfast. Take 6 tablets today and decrease by one tablet daily until none are left.       Allergies  Allergen Reactions  . Demerol [Meperidine] Anaphylaxis and Swelling  . Imitrex [Sumatriptan] Anaphylaxis  . Penicillins Anaphylaxis and Swelling  . Sulfa Antibiotics  Anaphylaxis  . Levaquin [Levofloxacin In D5w]     Abdominal pain  . Neurontin [Gabapentin] Other (See Comments)    Abdominal pain  . Toradol [Ketorolac Tromethamine] Other (See Comments)    "Feels like my whole body is on fire."  . Tramadol Other (See Comments)    Stomach upset   . Doxycycline Nausea And Vomiting            Follow-up Information    Follow up with Nichole Pizza, MD. Schedule an appointment as soon as possible for a visit on 04/16/2015.   Specialty:  Internal Medicine   Why:  appopintment time at 11:00 AM   Contact information:    [address]  [place] [address] [phone]        The results of significant diagnostics from this hospitalization (including imaging, microbiology, ancillary and laboratory) are listed below for reference.    Significant Diagnostic Studies: Dg Chest 2 View  04/05/2015   CLINICAL DATA:  Weakness and arm pain  EXAM: CHEST  2 VIEW  COMPARISON:  04/16/2014  FINDINGS: The heart size and mediastinal contours are within normal limits. Both lungs are clear. The visualized skeletal structures are unremarkable. Postsurgical changes are noted in the cervical spine.  IMPRESSION: No active cardiopulmonary disease.   Electronically Signed   By: Alcide Clever M.D.   On: 04/05/2015 19:03   Ct Head Wo Contrast  04/05/2015   CLINICAL DATA:  Severe pressure sensation in the head with left arm weakness and nausea. Symptoms for 3 days.  EXAM: CT HEAD WITHOUT CONTRAST  TECHNIQUE: Contiguous axial images were obtained from the base of the skull through the vertex without intravenous contrast.  COMPARISON:  Head CT scan 05/16/2013.  FINDINGS: The brain appears normal without hemorrhage, infarct, mass lesion, mass effect, midline shift or abnormal extra-axial fluid collection. No hydrocephalus or pneumocephalus. The calvarium is intact. Imaged paranasal sinuses and mastoid air cells are clear.  IMPRESSION: Normal head CT.   Electronically Signed   By: Drusilla Kanner M.D.   On: 04/05/2015 18:46   Mri Brain Without Contrast  04/07/2015   CLINICAL DATA:  54 year old diabetic hypertensive female with hyperlipidemia and history of multiple sclerosis and migraine headaches. Presenting with headache and left-sided weakness with numbness. Subsequent encounter.  EXAM: MRI HEAD WITHOUT CONTRAST  MRA HEAD WITHOUT CONTRAST  TECHNIQUE: Multiplanar, multiecho pulse sequences of the brain and surrounding structures were obtained without intravenous contrast. Angiographic images of the head were obtained using MRA technique without contrast.  COMPARISON:  04/05/2015 head CT. 01/14/2009 brain MR and MR angiogram circle of Willis.  FINDINGS: MRI HEAD FINDINGS  No  acute infarct.  No intracranial hemorrhage.  Scattered punctate nonspecific white matter type changes typically seen as a result of small vessel disease in a patient of this age with diabetes and hypertension. The appearance is not typical for diagnostic of multiple sclerosis which cannot be entirely excluded but felt to be less likely consideration.  No hydrocephalus.  Major intracranial vascular structures are patent.  No intracranial mass lesion noted on this unenhanced exam.  Cervical medullary junction, pituitary region, pineal region and orbital structures unremarkable.  MRA HEAD FINDINGS  Cavernous segment internal carotid artery ectasia with mild narrowing and irregularity.  Mild narrowing internal carotid artery supraclinoid segment more notable on the right.  Tiny bulge distal A1 segment right anterior cerebral artery may represent origin of a vessel or result of motion artifact although tiny 1.1 mm aneurysm cannot be entirely excluded. Is difficult to determine if this was present previously as the present exam is of higher quality.  Left vertebral artery is dominant. No high-grade stenosis of the vertebral arteries.  Nonvisualized left anterior inferior cerebellar artery.  Moderate narrowing origin of the right  superior cerebellar artery.  IMPRESSION: MRI HEAD  No acute infarct.  Scattered punctate nonspecific white matter type changes typically seen as a result of small vessel disease in a patient of this age with diabetes and hypertension. The appearance is not typical for diagnostic of multiple sclerosis.  MRA HEAD  Cavernous segment internal carotid artery ectasia with mild narrowing and irregularity.  Mild narrowing internal carotid artery supraclinoid segment more notable on the right.  Tiny bulge distal A1 segment right anterior cerebral artery may represent origin of a vessel or result of motion artifact although tiny 1.1 mm aneurysm cannot be entirely excluded. Is difficult to determine if this was present previously as the present exam is of higher quality.  Nonvisualized left anterior inferior cerebellar artery.  Moderate narrowing origin of the right superior cerebellar artery.   Electronically Signed   By: Lacy Duverney M.D.   On: 04/07/2015 09:32   Mr Cervical Spine Wo Contrast  04/08/2015   CLINICAL DATA:  Left-sided weakness. History of stroke, multiple sclerosis, migraine headaches and cervical fusion. Subsequent encounter.  EXAM: MRI CERVICAL SPINE WITHOUT CONTRAST  TECHNIQUE: Multiplanar, multisequence MR imaging of the cervical spine was performed. No intravenous contrast was administered.  COMPARISON:  Radiographs 12/30/2013. MRI 05/23/2013. More recent MRI performed 03/07/2014 at [facility] unavailable.  FINDINGS: The cervical alignment is normal status post C4-6 ACDF with an anterior plate and screws. There is no bone marrow edema or evidence of posterior ligamentous injury.  The craniocervical junction appears normal. The cervical cord is normal in signal and caliber. There are bilateral vertebral artery flow voids.  There are no significant disc space findings at C1-2 or C2-3.  C3-4: Small central disc protrusion without cord deformity or foraminal compromise.  C4-5:  Status post  ACDF.  No cord deformity or foraminal compromise.  C5-6:  Status post ACDF.  No cord deformity or foraminal compromise.  C6-7: Stable mild disc bulging. No cord deformity or foraminal compromise.  C7-T1: No significant findings.  IMPRESSION: 1. No complication status post C4-6 ACDF. The operative levels demonstrate no residual spinal stenosis or nerve root encroachment. Please note previous postoperative study unavailable for comparison. 2. Mild disc degeneration at C3-4 and C6-7 without cord deformity or nerve root encroachment. 3. No evidence of demyelinating lesions within the cervical cord or visualized posterior fossa.   Electronically Signed   By:  Carey Bullocks M.D.   On: 04/08/2015 10:25   US Carotid Bilateral  04/06/2015   CLINICAL DATA:  Stroke today.  EXAM: BILATERAL CAROTID DUPLEX ULTRASOUND  TECHNIQUE: Wallace Cullens scale imaging, color Doppler and duplex ultrasound were performed of bilateral carotid and vertebral arteries in the neck.  COMPARISON:  None.  FINDINGS: Criteria: Quantification of carotid stenosis is based on velocity parameters that correlate the residual internal carotid diameter with NASCET-based stenosis levels, using the diameter of the distal internal carotid lumen as the denominator for stenosis measurement.  The following velocity measurements were obtained:  RIGHT  ICA:  69 cm/sec  CCA:  60 cm/sec  SYSTOLIC ICA/CCA RATIO:  1.6  DIASTOLIC ICA/CCA RATIO:  1.7  ECA:  87 cm/sec  LEFT  ICA:  68 cm/sec  CCA:  66 cm/sec  SYSTOLIC ICA/CCA RATIO:  1.0  DIASTOLIC ICA/CCA RATIO:  1.4  ECA:  90 cm/sec  RIGHT CAROTID ARTERY: Minimal intimal thickening of the common carotid artery. Smooth noncalcified plaque at the carotid bulb. Normal Doppler waveforms demonstrated.  RIGHT VERTEBRAL ARTERY:  Normal antegrade flow.  LEFT CAROTID ARTERY: Minimal intimal thickening of the common carotid artery. Minimal smooth atherosclerotic plaque at the carotid bulb. Normal Doppler waveforms.  LEFT VERTEBRAL ARTERY:   Normal antegrade flow.  IMPRESSION: Minimal smooth atherosclerotic plaque at the carotid bulbs bilaterally. No hemodynamically significant stenosis.  Normal antegrade flow within the vertebral arteries bilaterally.   Electronically Signed   By: Elberta Fortis M.D.   On: 04/06/2015 21:30   Dg Chest Portable 1 View  04/12/2015   CLINICAL DATA:  Chest pain.  EXAM: PORTABLE CHEST - 1 VIEW  COMPARISON:  04/05/2015  FINDINGS: The heart size and mediastinal contours are within normal limits. Both lungs are clear. The visualized skeletal structures are unremarkable.  IMPRESSION: Normal exam.   Electronically Signed   By: Francene Boyers M.D.   On: 04/12/2015 19:10   Mr Palma Holter  04/07/2015   CLINICAL DATA:  54 year old diabetic hypertensive female with hyperlipidemia and history of multiple sclerosis and migraine headaches. Presenting with headache and left-sided weakness with numbness. Subsequent encounter.  EXAM: MRI HEAD WITHOUT CONTRAST  MRA HEAD WITHOUT CONTRAST  TECHNIQUE: Multiplanar, multiecho pulse sequences of the brain and surrounding structures were obtained without intravenous contrast. Angiographic images of the head were obtained using MRA technique without contrast.  COMPARISON:  04/05/2015 head CT. 01/14/2009 brain MR and MR angiogram circle of Willis.  FINDINGS: MRI HEAD FINDINGS  No acute infarct.  No intracranial hemorrhage.  Scattered punctate nonspecific white matter type changes typically seen as a result of small vessel disease in a patient of this age with diabetes and hypertension. The appearance is not typical for diagnostic of multiple sclerosis which cannot be entirely excluded but felt to be less likely consideration.  No hydrocephalus.  Major intracranial vascular structures are patent.  No intracranial mass lesion noted on this unenhanced exam.  Cervical medullary junction, pituitary region, pineal region and orbital structures unremarkable.  MRA HEAD FINDINGS  Cavernous segment internal  carotid artery ectasia with mild narrowing and irregularity.  Mild narrowing internal carotid artery supraclinoid segment more notable on the right.  Tiny bulge distal A1 segment right anterior cerebral artery may represent origin of a vessel or result of motion artifact although tiny 1.1 mm aneurysm cannot be entirely excluded. Is difficult to determine if this was present previously as the present exam is of higher quality.  Left vertebral artery is dominant. No high-grade stenosis  of the vertebral arteries.  Nonvisualized left anterior inferior cerebellar artery.  Moderate narrowing origin of the right superior cerebellar artery.  IMPRESSION: MRI HEAD  No acute infarct.  Scattered punctate nonspecific white matter type changes typically seen as a result of small vessel disease in a patient of this age with diabetes and hypertension. The appearance is not typical for diagnostic of multiple sclerosis.  MRA HEAD  Cavernous segment internal carotid artery ectasia with mild narrowing and irregularity.  Mild narrowing internal carotid artery supraclinoid segment more notable on the right.  Tiny bulge distal A1 segment right anterior cerebral artery may represent origin of a vessel or result of motion artifact although tiny 1.1 mm aneurysm cannot be entirely excluded. Is difficult to determine if this was present previously as the present exam is of higher quality.  Nonvisualized left anterior inferior cerebellar artery.  Moderate narrowing origin of the right superior cerebellar artery.   Electronically Signed   By: Lacy Duverney M.D.   On: 04/07/2015 09:32    Microbiology: No results found for this or any previous visit (from the past 240 hour(s)).   Labs: Basic Metabolic Panel:  Recent Labs Lab 04/08/15 0645 04/12/15 2034 04/13/15 0224  NA 143 141 141  K 4.1 3.4* 4.1  CL 104 104 104  CO2 GLUCOSE 179* 165* 162*  BUN CREATININE 0.84 0.83 0.79  CALCIUM 9.3 9.0 8.7*   Liver  Function Tests:  Recent Labs Lab 04/12/15 2034  AST 15  ALT 22  ALKPHOS 67  BILITOT 0.5  PROT 7.1  ALBUMIN 4.2   No results for input(s): LIPASE, AMYLASE in the last 168 hours. No results for input(s): AMMONIA in the last 168 hours. CBC:  Recent Labs Lab 04/12/15 1800 04/13/15 0224  WBC 7.1 4.5  HGB 12.4 11.5*  HCT 36.9 34.2*  MCV 91.3 92.4  PLT 235 187   Cardiac Enzymes:  Recent Labs Lab 04/12/15 2034 04/13/15 0224 04/13/15 0848  TROPONINI <0.03 <0.03 <0.03   BNP: BNP (last 3 results)  Recent Labs  04/12/15 2034  BNP 34.0    ProBNP (last 3 results) No results for input(s): PROBNP in the last 8760 hours.  CBG:  Recent Labs Lab 04/07/15 1633 04/07/15 2032 04/08/15 0730 04/08/15 1155 04/08/15 1637  GLUCAP 102* 105* 186* 223* 171*       Signed:  HERNANDEZ ACOSTA,ESTELA  [facility] Pager: [phone] 04/13/2015, 2:33 PM

## 2015-04-13 NOTE — Care Management Note (Signed)
Case Management Note  Patient Details  Name: DAHNA ELLISOR MRN: [id number] Date of Birth: October 21, 1961   Expected Discharge Date:                  Expected Discharge Plan:  Home/Self Care  In-House Referral:  NA  Discharge planning Services  CM Consult  Post Acute Care Choice:    Choice offered to:     DME Arranged:    DME Agency:     HH Arranged:    HH Agency:     Status of Service:  Completed, signed off  Medicare Important Message Given:    Date Medicare IM Given:    Medicare IM give by:    Date Additional Medicare IM Given:    Additional Medicare Important Message give by:     If discussed at Long Length of Stay Meetings, dates discussed:    Additional Comments: Pt is from home with self care. Pt recently discharged and at that time referred for OP PT. Pt has no yet started OP PT. Pt being discharged home today with self care. Pt does not qualify for home O2. No CM needs at this time.   Malcolm Metro, RN 04/13/2015, 11:24 AM

## 2015-04-20 ENCOUNTER — Telehealth: Payer: Self-pay | Admitting: Nurse Practitioner

## 2015-04-20 ENCOUNTER — Ambulatory Visit: Payer: Self-pay | Admitting: Nurse Practitioner

## 2015-04-20 NOTE — Telephone Encounter (Signed)
Pt was a no show

## 2015-04-20 NOTE — Telephone Encounter (Signed)
Noted  

## 2015-05-08 ENCOUNTER — Other Ambulatory Visit (HOSPITAL_COMMUNITY): Payer: Self-pay | Admitting: Respiratory Therapy

## 2015-05-08 DIAGNOSIS — J441 Chronic obstructive pulmonary disease with (acute) exacerbation: Secondary | ICD-10-CM

## 2015-05-20 ENCOUNTER — Ambulatory Visit (HOSPITAL_COMMUNITY)
Admission: RE | Admit: 2015-05-20 | Discharge: 2015-05-20 | Disposition: A | Discharge status: Home / Self Care | Payer: 59 | Source: Ambulatory Visit | Attending: Internal Medicine | Admitting: Internal Medicine

## 2015-05-20 DIAGNOSIS — J441 Chronic obstructive pulmonary disease with (acute) exacerbation: Secondary | ICD-10-CM | POA: Insufficient documentation

## 2015-05-20 LAB — PULMONARY FUNCTION TEST
DL/VA % PRED: 76 %
DL/VA: 3.87 ml/min/mmHg/L
DLCO UNC % PRED: 58 %
DLCO UNC: 15.67 ml/min/mmHg
FEF 25-75 POST: 2.08 L/s
FEF 25-75 PRE: 1.34 L/s
FEF2575-%CHANGE-POST: 55 %
FEF2575-%Pred-Post: 79 %
FEF2575-%Pred-Pre: 50 %
FEV1-%Change-Post: 10 %
FEV1-%PRED-POST: 82 %
FEV1-%Pred-Pre: 74 %
FEV1-PRE: 2.13 L
FEV1-Post: 2.35 L
FEV1FVC-%CHANGE-POST: 9 %
FEV1FVC-%Pred-Pre: 89 %
FEV6-%CHANGE-POST: 2 %
FEV6-%PRED-PRE: 83 %
FEV6-%Pred-Post: 85 %
FEV6-Post: 3.02 L
FEV6-Pre: 2.95 L
FEV6FVC-%Change-Post: 1 %
FEV6FVC-%Pred-Post: 103 %
FEV6FVC-%Pred-Pre: 101 %
FVC-%Change-Post: 0 %
FVC-%PRED-POST: 82 %
FVC-%Pred-Pre: 81 %
FVC-POST: 3.02 L
FVC-PRE: 3 L
POST FEV6/FVC RATIO: 100 %
PRE FEV1/FVC RATIO: 71 %
Post FEV1/FVC ratio: 78 %
Pre FEV6/FVC Ratio: 98 %
RV % PRED: 107 %
RV: 2.15 L
TLC % pred: 91 %
TLC: 4.92 L

## 2015-05-20 MED ORDER — ALBUTEROL SULFATE (2.5 MG/3ML) 0.083% IN NEBU
2.5000 mg | INHALATION_SOLUTION | Freq: Once | RESPIRATORY_TRACT | Status: AC
  Administered 2015-05-20: 2.5 mg via RESPIRATORY_TRACT

## 2015-06-03 ENCOUNTER — Emergency Department (HOSPITAL_COMMUNITY)
Admission: EM | Admit: 2015-06-03 | Discharge: 2015-06-04 | Disposition: A | Discharge status: Home / Self Care | Payer: 59 | Attending: Emergency Medicine | Admitting: Emergency Medicine

## 2015-06-03 ENCOUNTER — Encounter (HOSPITAL_COMMUNITY): Payer: Self-pay

## 2015-06-03 DIAGNOSIS — T424X1A Poisoning by benzodiazepines, accidental (unintentional), initial encounter: Secondary | ICD-10-CM | POA: Diagnosis not present

## 2015-06-03 DIAGNOSIS — Z8673 Personal history of transient ischemic attack (TIA), and cerebral infarction without residual deficits: Secondary | ICD-10-CM | POA: Insufficient documentation

## 2015-06-03 DIAGNOSIS — E039 Hypothyroidism, unspecified: Secondary | ICD-10-CM | POA: Insufficient documentation

## 2015-06-03 DIAGNOSIS — Z792 Long term (current) use of antibiotics: Secondary | ICD-10-CM | POA: Insufficient documentation

## 2015-06-03 DIAGNOSIS — G8929 Other chronic pain: Secondary | ICD-10-CM | POA: Insufficient documentation

## 2015-06-03 DIAGNOSIS — G43909 Migraine, unspecified, not intractable, without status migrainosus: Secondary | ICD-10-CM | POA: Diagnosis not present

## 2015-06-03 DIAGNOSIS — Y998 Other external cause status: Secondary | ICD-10-CM | POA: Diagnosis not present

## 2015-06-03 DIAGNOSIS — J441 Chronic obstructive pulmonary disease with (acute) exacerbation: Secondary | ICD-10-CM | POA: Diagnosis not present

## 2015-06-03 DIAGNOSIS — Z86010 Personal history of colonic polyps: Secondary | ICD-10-CM | POA: Diagnosis not present

## 2015-06-03 DIAGNOSIS — Y9289 Other specified places as the place of occurrence of the external cause: Secondary | ICD-10-CM | POA: Diagnosis not present

## 2015-06-03 DIAGNOSIS — Z79899 Other long term (current) drug therapy: Secondary | ICD-10-CM | POA: Insufficient documentation

## 2015-06-03 DIAGNOSIS — Z720 Tobacco use: Secondary | ICD-10-CM | POA: Diagnosis not present

## 2015-06-03 DIAGNOSIS — F419 Anxiety disorder, unspecified: Secondary | ICD-10-CM | POA: Diagnosis not present

## 2015-06-03 DIAGNOSIS — Y9389 Activity, other specified: Secondary | ICD-10-CM | POA: Insufficient documentation

## 2015-06-03 DIAGNOSIS — Z880 Allergy status to penicillin: Secondary | ICD-10-CM | POA: Insufficient documentation

## 2015-06-03 DIAGNOSIS — I509 Heart failure, unspecified: Secondary | ICD-10-CM | POA: Diagnosis not present

## 2015-06-03 DIAGNOSIS — Z7951 Long term (current) use of inhaled steroids: Secondary | ICD-10-CM | POA: Insufficient documentation

## 2015-06-03 DIAGNOSIS — T50901A Poisoning by unspecified drugs, medicaments and biological substances, accidental (unintentional), initial encounter: Secondary | ICD-10-CM

## 2015-06-03 DIAGNOSIS — Z7901 Long term (current) use of anticoagulants: Secondary | ICD-10-CM | POA: Insufficient documentation

## 2015-06-03 DIAGNOSIS — K219 Gastro-esophageal reflux disease without esophagitis: Secondary | ICD-10-CM | POA: Diagnosis not present

## 2015-06-03 DIAGNOSIS — F329 Major depressive disorder, single episode, unspecified: Secondary | ICD-10-CM | POA: Insufficient documentation

## 2015-06-03 DIAGNOSIS — X58XXXA Exposure to other specified factors, initial encounter: Secondary | ICD-10-CM | POA: Insufficient documentation

## 2015-06-03 DIAGNOSIS — M797 Fibromyalgia: Secondary | ICD-10-CM | POA: Insufficient documentation

## 2015-06-03 DIAGNOSIS — Z87442 Personal history of urinary calculi: Secondary | ICD-10-CM | POA: Diagnosis not present

## 2015-06-03 DIAGNOSIS — T481X1A Poisoning by skeletal muscle relaxants [neuromuscular blocking agents], accidental (unintentional), initial encounter: Secondary | ICD-10-CM | POA: Diagnosis not present

## 2015-06-03 NOTE — ED Notes (Signed)
Per patient's daughter, the patient filled a prescription for 1 mg xanax at 1630 today for 60 pills.  Per the daughter, there are 19 pills missing from the bottle since then.  Daughter feels the patient may have done this accidentally due to pain.

## 2015-06-03 NOTE — ED Provider Notes (Signed)
[name]: [id number]     Arrival date & time 06/03/15  2317 History   First MD Initiated Contact with Patient 06/03/15 2339     Chief Complaint  Patient presents with  . Drug Overdose  LEVEL 5 CAVEAT DUE TO ALTERED MENTAL STATUS  Patient is a 54 y.o. female presenting with Overdose. The history is provided by the patient and a relative. The history is limited by the condition of the patient.  Drug Overdose This is a new problem. The current episode started 6 to 12 hours ago. The problem occurs constantly. The problem has been gradually worsening. Nothing aggravates the symptoms. Nothing relieves the symptoms.  Pt presents for accidental drug overdose Per daughter, pt had xanax prescription filled at 1630, and several hours later she was found in the floor of the house smoking a cigarette and somnolent.  Daughter reports 19 tablets of xanax were missing.  Pt also takes flexeril.  Daughter reports pt had back pain and may have taken this as pain relief  Pt is somnolent but will arouse and she denies SI   Past Medical History  Diagnosis Date  . Gastritis   . Fibromyalgia   . Edema of lower extremity   . DDD (degenerative disc disease)   . MS (multiple sclerosis)     early stages  . Neuropathy   . COPD (chronic obstructive pulmonary disease)   . Hiatal hernia   . Gout   . TIA (transient ischemic attack)     Hx: of  . Asthma     Albuterol prn   . Depression     takes Celexa,Clonazepam daily  . Muscle spasm     takes Flexeril daily as needed  . GERD (gastroesophageal reflux disease)     takes Pepcid daily  . CHF (congestive heart failure)     takes Furosemide daily  . Hypothyroidism     takes Synthroid daily  . HTN (hypertension)     takes lisinopril daily  . Hyperlipidemia     but doesn't take any meds  . Phlebitis   . Emphysema lung   . Shortness of breath     with exertion or stressed   . Pneumonia     hx of;last time in 2014  . History of bronchitis     last time  4-89months ago   . Migraine     has one now  . Migraine   . Stroke     left sided weakness  . Joint pain   . Joint swelling   . Chronic back pain     DDD/scoliosis  . Osteoporosis   . Hemorrhoids   . History of colon polyps   . Urinary frequency   . History of kidney stones     is in her left kidney and has been for couple of yrs and no problems  . Diabetes mellitus     borderline  . Insomnia     doesn't take any meds for this  . Gout of big toe     RIGHT  . Complication of anesthesia     hard to wake up  . PONV (postoperative nausea and vomiting)   . Sleep apnea     sleep study done at least 72yrs ago;has CPAP but doesn't use  . Anxiety    Past Surgical History  Procedure Laterality Date  . Fundic gland polyp      benign  . Sigmoidoscopy  01/31/08    large internal hemorrhoids/small rectal  polyp removed/rare sigmoid diverticula  . Esophagogastroduodenoscopy  10/29/07    normal  . Cholecystectomy    . Bladder suspension    . Abdominal hysterectomy    . Carpal tunnel release Right   . Cardiac catheterization    . Colonoscopy    . Anterior cervical decomp/discectomy fusion N/A 12/30/2013    Procedure: ANTERIOR CERVICAL DISCECTOMY FUSION C4-5, C5-6, plate and screws, allograft, local bone graft;  Surgeon: Kerrin Champagne, MD;  Location: [facility] OR;  Service: Orthopedics;  Laterality: N/A;  . Back surgery      lumbar discectomy  . Colonoscopy with propofol N/A 01/20/2015    SLF: 1. Normal ileum 2. Redundant left colon 3. Smalll internal hemorrhoids.   . Esophageal biopsy N/A 01/20/2015    Procedure: COLON BIOPSY;  Surgeon: West Bali, MD;  Location: [facility];  Service: Endoscopy;  Laterality: N/A;  . Polypectomy N/A 01/20/2015    Procedure: RECTAL POLYPECTOMY;  Surgeon: West Bali, MD;  Location: [facility];  Service: Endoscopy;  Laterality: N/A;   Family History  Problem Relation Age of Onset  . Diabetes Mother   . Hypertension Mother   . Arthritis Father   . Diabetes  Sister   . Neuropathy Sister   . Transient ischemic attack Sister   . Heart disease Sister   . Hypertension Sister   . Cancer - Lung Other   . Cancer - Prostate Other   . Cancer - Other Other   . Colon cancer Paternal Uncle   . Colon cancer Brother     deceased age 98 from colon CA and brain CA   History  Substance Use Topics  . Smoking status: Current Some Day Smoker -- 0.50 packs/day for 15 years    Types: Cigarettes  . Smokeless tobacco: Never Used  . Alcohol Use: No   OB History    Gravida Para Term Preterm AB TAB SAB Ectopic Multiple Living   2 2 0 2      2     Review of Systems  Unable to perform ROS: Mental status change  Psychiatric/Behavioral: Negative for suicidal ideas.      Allergies  Demerol; Imitrex; Penicillins; Sulfa antibiotics; Levaquin; Neurontin; Toradol; Tramadol; and Doxycycline  Home Medications   Prior to Admission medications   Medication Sig Start Date End Date Taking? Authorizing Provider  albuterol (PROVENTIL HFA;VENTOLIN HFA) 108 (90 BASE) MCG/ACT inhaler Inhale 2 puffs into the lungs every 6 (six) hours as needed. Shortness of breath.    Historical Provider, MD  ALPRAZolam Prudy Feeler) 1 MG tablet Take 1 mg by mouth 2 (two) times daily.     Historical Provider, MD  azithromycin (ZITHROMAX) 250 MG tablet Take 1 tablet daily 04/14/15   Henderson Cloud, MD  butalbital-acetaminophen-caffeine (FIORICET, ESGIC) [phone] MG per tablet Take 2 tablets by mouth every 4 (four) hours as needed for headache. 04/08/15   Gwenyth Bender, NP  citalopram (CELEXA) 40 MG tablet Take 40 mg by mouth daily.    Historical Provider, MD  clopidogrel (PLAVIX) 75 MG tablet Take 1 tablet (75 mg total) by mouth daily. 04/08/15   Gwenyth Bender, NP  cyclobenzaprine (FLEXERIL) 10 MG tablet Take 10 mg by mouth 3 (three) times daily as needed for muscle spasms.    Historical Provider, MD  dicyclomine (BENTYL) 20 MG tablet Take 1 tablet (20 mg total) by mouth 4 (four) times  daily -  before meals and at bedtime. 01/20/15   Sandi L Fields,  MD  famotidine (PEPCID) 20 MG tablet Take 2 tablets (40 mg total) by mouth daily. 04/08/15   Gwenyth Bender, NP  Fluticasone Furoate-Vilanterol (BREO ELLIPTA) 100-25 MCG/INH AEPB Inhale 1 puff into the lungs daily.    Historical Provider, MD  furosemide (LASIX) 20 MG tablet Take 20 mg by mouth daily.    Historical Provider, MD  levothyroxine (SYNTHROID, LEVOTHROID) 88 MCG tablet Take 88 mcg by mouth daily before breakfast.    Historical Provider, MD  lisinopril (PRINIVIL,ZESTRIL) 40 MG tablet Take 40 mg by mouth daily.    Historical Provider, MD  predniSONE (DELTASONE) 10 MG tablet Take 1 tablet (10 mg total) by mouth daily with breakfast. Take 6 tablets today and decrease by one tablet daily until none are left. 04/13/15   Henderson Cloud, MD   BP 159/86 mmHg  Pulse 74  Temp(Src) 97.5 F (36.4 C) (Oral)  Resp 18  SpO2 100% Physical Exam CONSTITUTIONAL: frail, somnolent HEAD: Normocephalic/atraumatic EYES: pinpoint pupils ENMT: Mucous membranes moist NECK: supple no meningeal signs SPINE/BACK:entire spine nontender CV: S1/S2 noted, no murmurs/rubs/gallops noted LUNGS: Lungs are clear to auscultation bilaterally, no apparent distress ABDOMEN: soft, nontender, no rebound or guarding, bowel sounds noted throughout abdomen NEURO: Pt is somnolent but arousable to voice.  She can move all extremities x4  EXTREMITIES: pulses normal/equal, full ROM, no deformity or signs of trauma SKIN: warm, color normal PSYCH: unable to assess  ED Course  Procedures  CRITICAL CARE Performed by: Joya Gaskins Total critical care time: 34 Critical care time was exclusive of separately billable procedures and treating other patients. Critical care was necessary to treat or prevent imminent or life-threatening deterioration. Critical care was time spent personally by me on the following activities: development of treatment plan with  patient and/or surrogate as well as nursing, discussions with consultants, evaluation of patient's response to treatment, examination of patient, obtaining history from patient or surrogate, ordering and performing treatments and interventions, ordering and review of laboratory studies, ordering and review of radiographic studies, pulse oximetry and re-evaluation of patient's condition. Patient with somnolence, altered mental status, hypoxia that required monitoring and she required IV narcan.  Pt stabilized in the ER 11:57 PM Pt here for accidental overdose Currently she is protecting her airway and is arousable Will follow closely 1:13 AM Pt was still somnolent.  She was given narcan with no immediate response but is now more awake and wants to go to bathroom 2:30 AM Pt resting but easily arousable Hypoxia seen earlier is resolving PCC recommended observing for at least 4 hours Will continue to monitor 4:21 AM Pt sleeping but easily arousable Continue to monitor 5:03 AM Pt ambulatory She is taking PO fluids She denies any use of pain medications including oxycodone/hydrocodone When I informed her of presence of opiates in her urine, she reports "oh yeah my daughter gave me hydrocodone yesterday" She reports she just got her xanax filled and only "took 2" but then later reports she "took 4" I informed her daughter told me she is missing 49 xanax that just got filled and she is not aware of taking that many medications She denies SI I advised caution over use of sedating medications Otherwise pt is awake/alert, appropriate and safe for discharge  Labs Review Labs Reviewed  COMPREHENSIVE METABOLIC PANEL - Abnormal; Notable for the following:    Potassium 3.1 (*)    Glucose, Bld 101 (*)    Total Protein 6.3 (*)    AST 80 (*)  All other components within normal limits  CBC WITH DIFFERENTIAL/PLATELET - Abnormal; Notable for the following:    RBC 3.81 (*)    Hemoglobin 11.7 (*)     HCT 34.7 (*)    All other components within normal limits  ACETAMINOPHEN LEVEL - Abnormal; Notable for the following:    Acetaminophen (Tylenol), Serum <10 (*)    All other components within normal limits  URINE RAPID DRUG SCREEN, HOSP PERFORMED - Abnormal; Notable for the following:    Opiates POSITIVE (*)    Benzodiazepines POSITIVE (*)    All other components within normal limits  URINALYSIS, ROUTINE W REFLEX MICROSCOPIC (NOT AT [facility]) - Abnormal; Notable for the following:    Specific Gravity, Urine <1.005 (*)    All other components within normal limits  BLOOD GAS, ARTERIAL - Abnormal; Notable for the following:    pH, Arterial 7.340 (*)    pCO2 arterial 61.1 (*)    pO2, Arterial 51.4 (*)    Bicarbonate 32.1 (*)    Acid-Base Excess 6.5 (*)    All other components within normal limits  ETHANOL  SALICYLATE LEVEL     EKG Interpretation   Date/Time:  Thursday June 04 2015 00:02:19 EDT Ventricular Rate:  81 PR Interval:  150 QRS Duration: 88 QT Interval:  422 QTC Calculation: 490 R Axis:   61 Text Interpretation:  Normal sinus rhythm ST \\T \ T wave abnormality,  consider inferolateral ischemia Prolonged QT Abnormal ECG when compared to  prior, QT is more prolonged artifact noted Confirmed by Bebe Shaggy  MD,  Dorinda Hill ([id number]) on 06/04/2015 12:12:59 AM     Medications  naloxone (NARCAN) injection 0.4 mg (0.4 mg Intravenous Given 06/04/15 0037)    MDM   Final diagnoses:  Accidental overdose, initial encounter    Nursing notes including past medical history and social history reviewed and considered in documentation Labs/vital reviewed myself and considered during evaluation     Zadie Rhine, MD 06/04/15 [phone]

## 2015-06-04 LAB — COMPREHENSIVE METABOLIC PANEL
ALT: 42 U/L (ref 14–54)
ANION GAP: 9 (ref 5–15)
AST: 80 U/L — ABNORMAL HIGH (ref 15–41)
Albumin: 3.8 g/dL (ref 3.5–5.0)
Alkaline Phosphatase: 65 U/L (ref 38–126)
BUN: 7 mg/dL (ref 6–20)
CHLORIDE: 105 mmol/L (ref 101–111)
CO2: 31 mmol/L (ref 22–32)
CREATININE: 0.68 mg/dL (ref 0.44–1.00)
Calcium: 9 mg/dL (ref 8.9–10.3)
GFR calc Af Amer: >60 mL/min (ref >60)
GFR calc non Af Amer: >60 mL/min (ref >60)
GLUCOSE: 101 mg/dL — AB (ref 65–99)
POTASSIUM: 3.1 mmol/L — AB (ref 3.5–5.1)
Sodium: 145 mmol/L (ref 135–145)
TOTAL PROTEIN: 6.3 g/dL — AB (ref 6.5–8.1)
Total Bilirubin: 0.3 mg/dL (ref 0.3–1.2)

## 2015-06-04 LAB — BLOOD GAS, ARTERIAL
ACID-BASE EXCESS: 6.5 mmol/L — AB (ref 0.0–2.0)
Bicarbonate: 32.1 mEq/L — ABNORMAL HIGH (ref 20.0–24.0)
DRAWN BY: 213101
O2 Saturation: 85.3 %
PH ART: 7.34 — AB (ref 7.350–7.450)
PO2 ART: 51.4 mmHg — AB (ref 80.0–100.0)
Patient temperature: 37
TCO2: 29.6 mmol/L (ref 0–100)
pCO2 arterial: 61.1 mmHg (ref 35.0–45.0)

## 2015-06-04 LAB — CBC WITH DIFFERENTIAL/PLATELET
BASOS PCT: 1 % (ref 0–1)
Basophils Absolute: 0 10*3/uL (ref 0.0–0.1)
EOS ABS: 0.2 10*3/uL (ref 0.0–0.7)
Eosinophils Relative: 4 % (ref 0–5)
HEMATOCRIT: 34.7 % — AB (ref 36.0–46.0)
Hemoglobin: 11.7 g/dL — ABNORMAL LOW (ref 12.0–15.0)
Lymphocytes Relative: 37 % (ref 12–46)
Lymphs Abs: 1.8 10*3/uL (ref 0.7–4.0)
MCH: 30.7 pg (ref 26.0–34.0)
MCHC: 33.7 g/dL (ref 30.0–36.0)
MCV: 91.1 fL (ref 78.0–100.0)
Monocytes Absolute: 0.3 10*3/uL (ref 0.1–1.0)
Monocytes Relative: 7 % (ref 3–12)
NEUTROS PCT: 51 % (ref 43–77)
Neutro Abs: 2.6 10*3/uL (ref 1.7–7.7)
PLATELETS: 192 10*3/uL (ref 150–400)
RBC: 3.81 MIL/uL — AB (ref 3.87–5.11)
RDW: 13.8 % (ref 11.5–15.5)
WBC: 4.9 10*3/uL (ref 4.0–10.5)

## 2015-06-04 LAB — URINALYSIS, ROUTINE W REFLEX MICROSCOPIC
BILIRUBIN URINE: NEGATIVE
GLUCOSE, UA: NEGATIVE mg/dL
Hgb urine dipstick: NEGATIVE
KETONES UR: NEGATIVE mg/dL
Leukocytes, UA: NEGATIVE
Nitrite: NEGATIVE
PROTEIN: NEGATIVE mg/dL
Specific Gravity, Urine: <1.005 — ABNORMAL LOW (ref 1.005–1.030)
UROBILINOGEN UA: 0.2 mg/dL (ref 0.0–1.0)
pH: 6 (ref 5.0–8.0)

## 2015-06-04 LAB — RAPID URINE DRUG SCREEN, HOSP PERFORMED
Amphetamines: NOT DETECTED
Barbiturates: NOT DETECTED
Benzodiazepines: POSITIVE — AB
COCAINE: NOT DETECTED
Opiates: POSITIVE — AB
TETRAHYDROCANNABINOL: NOT DETECTED

## 2015-06-04 LAB — ACETAMINOPHEN LEVEL: Acetaminophen (Tylenol), Serum: <10 ug/mL — ABNORMAL LOW (ref 10–30)

## 2015-06-04 LAB — ETHANOL: Alcohol, Ethyl (B): <5 mg/dL (ref <5)

## 2015-06-04 LAB — SALICYLATE LEVEL: SALICYLATE LVL: 10.8 mg/dL (ref 2.8–30.0)

## 2015-06-04 MED ORDER — NALOXONE HCL 0.4 MG/ML IJ SOLN
0.4000 mg | Freq: Once | INTRAMUSCULAR | Status: AC
  Administered 2015-06-04: 0.4 mg via INTRAVENOUS
  Filled 2015-06-04: qty 1

## 2015-06-04 NOTE — ED Notes (Signed)
Pt up and awake in bed. Pt states she has taken 2 xanax and 3 flexeril today. Pt up to bedside toilet with minimal assistance.

## 2015-06-04 NOTE — ED Notes (Signed)
   06/04/15 0000  Suicide Risk Assessment  Charting Type Initial  Recently experienced and/or been treated for a psychiatric disorder, including depression or anxiety? No  Recently experienced a loss/disruption in support system? No  Substance abuse history and/or treatment for substance abuse? No  Expressing Hopelessness? No  Suicide Risk  Is patient at risk for harming self or others? No  Toxicology  Ashby Dawes of ingestion Accidental  Ingested substance xanax 1mg   Amount ingested approx 19 tablets  Date of ingestion 06/04/15  Time of ingestion (over a period of 7 hours)  Poison Control notified? Yes;Physician informed of recommendations  Poison control's recommendations Lfts and watch for 4-6 hours and then pt can be medically cleared.   Physician Name Dr Bebe Shaggy

## 2015-06-04 NOTE — ED Notes (Signed)
Pt ambulated around the dept with little assistance.

## 2015-06-04 NOTE — ED Notes (Signed)
Pt had no response to narcan.

## 2015-06-04 NOTE — Discharge Instructions (Signed)
Accidental Overdose  A drug overdose occurs when a chemical substance (drug or medication) is used in amounts large enough to overcome a person. This may result in severe illness or death. This is a type of poisoning. Accidental overdoses of medications or other substances come from a variety of reasons. When this happens accidentally, it is often because the person taking the substance does not know enough about what they have taken. Drugs which commonly cause overdose deaths are alcohol, psychotropic medications (medications which affect the mind), pain medications, illegal drugs (street drugs) such as cocaine and heroin, and multiple drugs taken at the same time. It may result from careless behavior (such as over-indulging at a party). Other causes of overdose may include multiple drug use, a lapse in memory, or drug use after a period of no drug use.   Sometimes overdosing occurs because a person cannot remember if they have taken their medication.   A common unintentional overdose in young children involves multi-vitamins containing iron. Iron is a part of the hemoglobin molecule in blood. It is used to transport oxygen to living cells. When taken in small amounts, iron allows the body to restock hemoglobin. In large amounts, it causes problems in the body. If this overdose is not treated, it can lead to death.  Never take medicines that show signs of tampering or do not seem quite right. Never take medicines in the dark or in poor lighting. Read the label and check each dose of medicine before you take it. When adults are poisoned, it happens most often through carelessness or lack of information. Taking medicines in the dark or taking medicine prescribed for someone else to treat the same type of problem is a dangerous practice.  SYMPTOMS   Symptoms of overdose depend on the medication and amount taken. They can vary from over-activity with stimulant over-dosage, to sleepiness from depressants such as  alcohol, narcotics and tranquilizers. Confusion, dizziness, nausea and vomiting may be present. If problems are severe enough coma and death may result.  DIAGNOSIS   Diagnosis and management are generally straightforward if the drug is known. Otherwise it is more difficult. At times, certain symptoms and signs exhibited by the patient, or blood tests, can reveal the drug in question.   TREATMENT   In an emergency department, most patients can be treated with supportive measures. Antidotes may be available if there has been an overdose of opioids or benzodiazepines. A rapid improvement will often occur if this is the cause of overdose.  At home or away from medical care:   There may be no immediate problems or warning signs in children.   Not everything works well in all cases of poisoning.   Take immediate action. Poisons may act quickly.   If you think someone has swallowed medicine or a household product, and the person is unconscious, having seizures (convulsions), or is not breathing, immediately call for an ambulance.  IF a person is conscious and appears to be doing OK but has swallowed a poison:   Do not wait to see what effect the poison will have. Immediately call a poison control center (listed in the white pages of your telephone book under "Poison Control" or inside the front cover with other emergency numbers). Some poison control centers have TTY capability for the deaf. Check with your local center if you or someone in your family requires this service.   Keep the container so you can read the label on the product for ingredients.     Describe what, when, and how much was taken and the age and condition of the person poisoned. Inform them if the person is vomiting, choking, drowsy, shows a change in color or temperature of skin, is conscious or unconscious, or is convulsing.   Do not cause vomiting unless instructed by medical personnel. Do not induce vomiting or force liquids into a person who  is convulsing, unconscious, or very drowsy.  Stay calm and in control.    Activated charcoal also is sometimes used in certain types of poisoning and you may wish to add a supply to your emergency medicines. It is available without a prescription. Call a poison control center before using this medication.  PREVENTION   Thousands of children die every year from unintentional poisoning. This may be from household chemicals, poisoning from carbon monoxide in a car, taking their parent's medications, or simply taking a few iron pills or vitamins with iron. Poisoning comes from unexpected sources.   Store medicines out of the sight and reach of children, preferably in a locked cabinet. Do not keep medications in a food cabinet. Always store your medicines in a secure place. Get rid of expired medications.   If you have children living with you or have them as occasional guests, you should have child-resistant caps on your medicine containers. Keep everything out of reach. Child proof your home.   If you are called to the telephone or to answer the door while you are taking a medicine, take the container with you or put the medicine out of the reach of small children.   Do not take your medication in front of children. Do not tell your child how good a medication is and how good it is for them. They may get the idea it is more of a treat.   If you are an adult and have accidentally taken an overdose, you need to consider how this happened and what can be done to prevent it from happening again. If this was from a street drug or alcohol, determine if there is a problem that needs addressing. If you are not sure a problems exists, it is easy to talk to a professional and ask them if they think you have a problem. It is better to handle this problem in this way before it happens again and has a much worse consequence.  Document Released: 01/07/2005 Document Revised: 01/16/2012 Document Reviewed: 06/15/2009  [employer]  Patient Information 2015 [employer]. This information is not intended to replace advice given to you by your health care provider. Make sure you discuss any questions you have with your health care provider.

## 2015-06-04 NOTE — ED Notes (Signed)
Pt given coke, tolerating po fluids well

## 2015-11-25 ENCOUNTER — Other Ambulatory Visit (HOSPITAL_COMMUNITY): Payer: Self-pay | Admitting: Internal Medicine

## 2015-11-25 DIAGNOSIS — M810 Age-related osteoporosis without current pathological fracture: Secondary | ICD-10-CM

## 2015-11-25 DIAGNOSIS — Z1231 Encounter for screening mammogram for malignant neoplasm of breast: Secondary | ICD-10-CM

## 2015-12-02 ENCOUNTER — Other Ambulatory Visit (HOSPITAL_COMMUNITY): Payer: Self-pay

## 2015-12-02 ENCOUNTER — Ambulatory Visit (HOSPITAL_COMMUNITY): Payer: Self-pay

## 2016-01-01 ENCOUNTER — Other Ambulatory Visit: Payer: Self-pay

## 2016-01-01 ENCOUNTER — Emergency Department (HOSPITAL_COMMUNITY): Payer: 59

## 2016-01-01 ENCOUNTER — Encounter (HOSPITAL_COMMUNITY): Payer: Self-pay | Admitting: Emergency Medicine

## 2016-01-01 ENCOUNTER — Emergency Department (HOSPITAL_COMMUNITY)
Admission: EM | Admit: 2016-01-01 | Discharge: 2016-01-01 | Disposition: A | Discharge status: Home / Self Care | Payer: 59 | Attending: Emergency Medicine | Admitting: Emergency Medicine

## 2016-01-01 DIAGNOSIS — K219 Gastro-esophageal reflux disease without esophagitis: Secondary | ICD-10-CM | POA: Diagnosis not present

## 2016-01-01 DIAGNOSIS — Z880 Allergy status to penicillin: Secondary | ICD-10-CM | POA: Insufficient documentation

## 2016-01-01 DIAGNOSIS — Z791 Long term (current) use of non-steroidal anti-inflammatories (NSAID): Secondary | ICD-10-CM | POA: Insufficient documentation

## 2016-01-01 DIAGNOSIS — I10 Essential (primary) hypertension: Secondary | ICD-10-CM | POA: Diagnosis not present

## 2016-01-01 DIAGNOSIS — Z7951 Long term (current) use of inhaled steroids: Secondary | ICD-10-CM | POA: Insufficient documentation

## 2016-01-01 DIAGNOSIS — Z87442 Personal history of urinary calculi: Secondary | ICD-10-CM | POA: Diagnosis not present

## 2016-01-01 DIAGNOSIS — E039 Hypothyroidism, unspecified: Secondary | ICD-10-CM | POA: Diagnosis not present

## 2016-01-01 DIAGNOSIS — M109 Gout, unspecified: Secondary | ICD-10-CM | POA: Diagnosis not present

## 2016-01-01 DIAGNOSIS — Z86010 Personal history of colonic polyps: Secondary | ICD-10-CM | POA: Insufficient documentation

## 2016-01-01 DIAGNOSIS — Z8673 Personal history of transient ischemic attack (TIA), and cerebral infarction without residual deficits: Secondary | ICD-10-CM | POA: Insufficient documentation

## 2016-01-01 DIAGNOSIS — R062 Wheezing: Secondary | ICD-10-CM | POA: Diagnosis present

## 2016-01-01 DIAGNOSIS — I509 Heart failure, unspecified: Secondary | ICD-10-CM | POA: Diagnosis not present

## 2016-01-01 DIAGNOSIS — Z79899 Other long term (current) drug therapy: Secondary | ICD-10-CM | POA: Diagnosis not present

## 2016-01-01 DIAGNOSIS — F329 Major depressive disorder, single episode, unspecified: Secondary | ICD-10-CM | POA: Diagnosis not present

## 2016-01-01 DIAGNOSIS — M797 Fibromyalgia: Secondary | ICD-10-CM | POA: Insufficient documentation

## 2016-01-01 DIAGNOSIS — J441 Chronic obstructive pulmonary disease with (acute) exacerbation: Secondary | ICD-10-CM | POA: Diagnosis not present

## 2016-01-01 DIAGNOSIS — G8929 Other chronic pain: Secondary | ICD-10-CM | POA: Diagnosis not present

## 2016-01-01 DIAGNOSIS — F17210 Nicotine dependence, cigarettes, uncomplicated: Secondary | ICD-10-CM | POA: Diagnosis not present

## 2016-01-01 DIAGNOSIS — Z8701 Personal history of pneumonia (recurrent): Secondary | ICD-10-CM | POA: Diagnosis not present

## 2016-01-01 LAB — CBC WITH DIFFERENTIAL/PLATELET
BASOS ABS: 0 10*3/uL (ref 0.0–0.1)
BASOS PCT: 1 %
Eosinophils Absolute: 0.4 10*3/uL (ref 0.0–0.7)
Eosinophils Relative: 6 %
HCT: 37.2 % (ref 36.0–46.0)
Hemoglobin: 12.2 g/dL (ref 12.0–15.0)
LYMPHS PCT: 20 %
Lymphs Abs: 1.5 10*3/uL (ref 0.7–4.0)
MCH: 29.2 pg (ref 26.0–34.0)
MCHC: 32.8 g/dL (ref 30.0–36.0)
MCV: 89 fL (ref 78.0–100.0)
Monocytes Absolute: 0.7 10*3/uL (ref 0.1–1.0)
Monocytes Relative: 9 %
NEUTROS PCT: 64 %
Neutro Abs: 4.9 10*3/uL (ref 1.7–7.7)
Platelets: 197 10*3/uL (ref 150–400)
RBC: 4.18 MIL/uL (ref 3.87–5.11)
RDW: 14.5 % (ref 11.5–15.5)
WBC: 7.4 10*3/uL (ref 4.0–10.5)

## 2016-01-01 LAB — COMPREHENSIVE METABOLIC PANEL
ALBUMIN: 3.8 g/dL (ref 3.5–5.0)
ALT: 15 U/L (ref 14–54)
AST: 16 U/L (ref 15–41)
Alkaline Phosphatase: 66 U/L (ref 38–126)
Anion gap: 7 (ref 5–15)
BILIRUBIN TOTAL: 0.3 mg/dL (ref 0.3–1.2)
BUN: 7 mg/dL (ref 6–20)
CO2: 33 mmol/L — ABNORMAL HIGH (ref 22–32)
Calcium: 8.9 mg/dL (ref 8.9–10.3)
Chloride: 104 mmol/L (ref 101–111)
Creatinine, Ser: 0.62 mg/dL (ref 0.44–1.00)
GFR calc Af Amer: >60 mL/min (ref >60)
GFR calc non Af Amer: >60 mL/min (ref >60)
Glucose, Bld: 103 mg/dL — ABNORMAL HIGH (ref 65–99)
Potassium: 4.1 mmol/L (ref 3.5–5.1)
Sodium: 144 mmol/L (ref 135–145)
Total Protein: 6.6 g/dL (ref 6.5–8.1)

## 2016-01-01 MED ORDER — METHYLPREDNISOLONE SODIUM SUCC 125 MG IJ SOLR
125.0000 mg | Freq: Once | INTRAMUSCULAR | Status: AC
  Administered 2016-01-01: 125 mg via INTRAVENOUS
  Filled 2016-01-01: qty 2

## 2016-01-01 MED ORDER — LORAZEPAM 2 MG/ML IJ SOLN
0.5000 mg | Freq: Once | INTRAMUSCULAR | Status: AC
  Administered 2016-01-01: 0.5 mg via INTRAVENOUS
  Filled 2016-01-01: qty 1

## 2016-01-01 MED ORDER — IPRATROPIUM-ALBUTEROL 0.5-2.5 (3) MG/3ML IN SOLN
3.0000 mL | Freq: Once | RESPIRATORY_TRACT | Status: AC
  Administered 2016-01-01: 3 mL via RESPIRATORY_TRACT
  Filled 2016-01-01: qty 3

## 2016-01-01 MED ORDER — ALBUTEROL SULFATE HFA 108 (90 BASE) MCG/ACT IN AERS
2.0000 | INHALATION_SPRAY | RESPIRATORY_TRACT | Status: DC | PRN
  Administered 2016-01-01: 2 via RESPIRATORY_TRACT
  Filled 2016-01-01: qty 6.7

## 2016-01-01 MED ORDER — ALBUTEROL SULFATE (2.5 MG/3ML) 0.083% IN NEBU
2.5000 mg | INHALATION_SOLUTION | Freq: Once | RESPIRATORY_TRACT | Status: AC
  Administered 2016-01-01: 2.5 mg via RESPIRATORY_TRACT
  Filled 2016-01-01: qty 3

## 2016-01-01 MED ORDER — ONDANSETRON 4 MG PO TBDP: ORAL_TABLET | ORAL | Status: DC

## 2016-01-01 MED ORDER — AZITHROMYCIN 250 MG PO TABS: ORAL_TABLET | ORAL | Status: DC

## 2016-01-01 MED ORDER — HYDROMORPHONE HCL 1 MG/ML IJ SOLN
0.5000 mg | Freq: Once | INTRAMUSCULAR | Status: AC
  Administered 2016-01-01: 0.5 mg via INTRAVENOUS
  Filled 2016-01-01 (×2): qty 1

## 2016-01-01 MED ORDER — PREDNISONE 10 MG PO TABS: 20.0000 mg | ORAL_TABLET | Freq: Every day | ORAL | Status: DC

## 2016-01-01 NOTE — ED Notes (Signed)
Physician informed of pt pain- will reassess- pt informed

## 2016-01-01 NOTE — ED Provider Notes (Signed)
[name]: [id number]     Arrival date & time 01/01/16  1053 History   First MD Initiated Contact with Patient 01/01/16 1242     Chief Complaint  Patient presents with  . Wheezing     (Consider location/radiation/quality/duration/timing/severity/associated sxs/prior Treatment) Patient is a 55 y.o. female presenting with wheezing. The history is provided by the patient (Patient complains of cough and shortness of breath for a week.).  Wheezing Severity:  Moderate Severity compared to prior episodes:  Similar Onset quality:  Sudden Timing:  Constant Progression:  Waxing and waning Chronicity:  Recurrent Context: not animal exposure   Associated symptoms: no chest pain, no cough, no fatigue, no headaches and no rash     Past Medical History  Diagnosis Date  . Gastritis   . Fibromyalgia   . Edema of lower extremity   . DDD (degenerative disc disease)   . MS (multiple sclerosis) (HCC)     early stages  . Neuropathy (HCC)   . COPD (chronic obstructive pulmonary disease) (HCC)   . Hiatal hernia   . Gout   . TIA (transient ischemic attack)     Hx: of  . Asthma     Albuterol prn   . Depression     takes Celexa,Clonazepam daily  . Muscle spasm     takes Flexeril daily as needed  . GERD (gastroesophageal reflux disease)     takes Pepcid daily  . CHF (congestive heart failure) (HCC)     takes Furosemide daily  . Hypothyroidism     takes Synthroid daily  . HTN (hypertension)     takes lisinopril daily  . Hyperlipidemia     but doesn't take any meds  . Phlebitis   . Emphysema lung (HCC)   . Shortness of breath     with exertion or stressed   . Pneumonia     hx of;last time in 2014  . History of bronchitis     last time 4-62months ago   . Migraine     has one now  . Migraine   . Stroke [facility])     left sided weakness  . Joint pain   . Joint swelling   . Chronic back pain     DDD/scoliosis  . Osteoporosis   . Hemorrhoids   . History of colon polyps   . Urinary frequency    . History of kidney stones     is in her left kidney and has been for couple of yrs and no problems  . Diabetes mellitus     borderline  . Insomnia     doesn't take any meds for this  . Gout of big toe     RIGHT  . Complication of anesthesia     hard to wake up  . PONV (postoperative nausea and vomiting)   . Sleep apnea     sleep study done at least 4yrs ago;has CPAP but doesn't use  . Anxiety    Past Surgical History  Procedure Laterality Date  . Fundic gland polyp      benign  . Sigmoidoscopy  01/31/08    large internal hemorrhoids/small rectal polyp removed/rare sigmoid diverticula  . Esophagogastroduodenoscopy  10/29/07    normal  . Cholecystectomy    . Bladder suspension    . Abdominal hysterectomy    . Carpal tunnel release Right   . Cardiac catheterization    . Colonoscopy    . Anterior cervical decomp/discectomy fusion N/A 12/30/2013  Procedure: ANTERIOR CERVICAL DISCECTOMY FUSION C4-5, C5-6, plate and screws, allograft, local bone graft;  Surgeon: Kerrin Champagne, MD;  Location: [facility] OR;  Service: Orthopedics;  Laterality: N/A;  . Back surgery      lumbar discectomy  . Colonoscopy with propofol N/A 01/20/2015    SLF: 1. Normal ileum 2. Redundant left colon 3. Smalll internal hemorrhoids.   . Biopsy N/A 01/20/2015    Procedure: COLON BIOPSY;  Surgeon: West Bali, MD;  Location: [facility] ORS;  Service: Endoscopy;  Laterality: N/A;  . Polypectomy N/A 01/20/2015    Procedure: RECTAL POLYPECTOMY;  Surgeon: West Bali, MD;  Location: [facility] ORS;  Service: Endoscopy;  Laterality: N/A;   Family History  Problem Relation Age of Onset  . Diabetes Mother   . Hypertension Mother   . Arthritis Father   . Diabetes Sister   . Neuropathy Sister   . Transient ischemic attack Sister   . Heart disease Sister   . Hypertension Sister   . Cancer - Lung Other   . Cancer - Prostate Other   . Cancer - Other Other   . Colon cancer Paternal Uncle   . Colon cancer Brother     deceased  age 60 from colon CA and brain CA   Social History  Substance Use Topics  . Smoking status: Current Some Day Smoker -- 0.50 packs/day for 15 years    Types: Cigarettes  . Smokeless tobacco: Never Used  . Alcohol Use: No   OB History    Gravida Para Term Preterm AB TAB SAB Ectopic Multiple Living   2 2 0 2      2     Review of Systems  Constitutional: Negative for appetite change and fatigue.  HENT: Negative for congestion, ear discharge and sinus pressure.   Eyes: Negative for discharge.  Respiratory: Positive for wheezing. Negative for cough.   Cardiovascular: Negative for chest pain.  Gastrointestinal: Negative for abdominal pain and diarrhea.  Genitourinary: Negative for frequency and hematuria.  Musculoskeletal: Negative for back pain.  Skin: Negative for rash.  Neurological: Negative for seizures and headaches.  Psychiatric/Behavioral: Negative for hallucinations.      Allergies  Demerol; Imitrex; Penicillins; Sulfa antibiotics; Levaquin; Neurontin; Toradol; Tramadol; and Doxycycline  Home Medications   Prior to Admission medications   Medication Sig Start Date End Date Taking? Authorizing Provider  albuterol (PROVENTIL HFA;VENTOLIN HFA) 108 (90 BASE) MCG/ACT inhaler Inhale 2 puffs into the lungs every 6 (six) hours as needed. Shortness of breath.   Yes Historical Provider, MD  ALPRAZolam Prudy Feeler) 1 MG tablet Take 1 mg by mouth 2 (two) times daily.    Yes Historical Provider, MD  Aspirin-Acetaminophen-Caffeine (GOODY HEADACHE PO) Take 1 Package by mouth 2 (two) times daily.   Yes Historical Provider, MD  citalopram (CELEXA) 40 MG tablet Take 40 mg by mouth daily.   Yes Historical Provider, MD  clonazePAM (KLONOPIN) 1 MG tablet Take 1 mg by mouth 3 (three) times daily. 11/24/15  Yes Historical Provider, MD  cyclobenzaprine (FLEXERIL) 10 MG tablet Take 10 mg by mouth 3 (three) times daily as needed for muscle spasms.   Yes Historical Provider, MD  famotidine (PEPCID) 20 MG  tablet Take 2 tablets (40 mg total) by mouth daily. 04/08/15  Yes Lesle Chris Black, NP  Fluticasone Furoate-Vilanterol (BREO ELLIPTA) 100-25 MCG/INH AEPB Inhale 1 puff into the lungs daily.   Yes Historical Provider, MD  furosemide (LASIX) 20 MG tablet Take 10 mg by mouth  daily.    Yes Historical Provider, MD  ibuprofen (ADVIL,MOTRIN) 800 MG tablet Take 800 mg by mouth 3 (three) times daily. 12/15/15  Yes Historical Provider, MD  levothyroxine (SYNTHROID, LEVOTHROID) 125 MCG tablet Take 125 mcg by mouth daily before breakfast.   Yes Historical Provider, MD  lisinopril (PRINIVIL,ZESTRIL) 40 MG tablet Take 40 mg by mouth daily.   Yes Historical Provider, MD  promethazine (PHENERGAN) 25 MG tablet Take 25 mg by mouth 3 (three) times daily. 12/15/15  Yes Historical Provider, MD  SUBOXONE 8-2 MG FILM Place 0.5 Film under the tongue 3 (three) times daily. 12/29/15  Yes Historical Provider, MD  trazodone (DESYREL) 300 MG tablet Take 150-300 mg by mouth at bedtime. 12/15/15  Yes Historical Provider, MD  azithromycin (ZITHROMAX Z-PAK) 250 MG tablet 2 po day one, then 1 daily x 4 days 01/01/16   Bethann Berkshire, MD  butalbital-acetaminophen-caffeine (FIORICET, ESGIC) [phone] MG per tablet Take 2 tablets by mouth every 4 (four) hours as needed for headache. 04/08/15   Gwenyth Bender, NP  predniSONE (DELTASONE) 10 MG tablet Take 2 tablets (20 mg total) by mouth daily. 01/01/16   Bethann Berkshire, MD   BP 130/62 mmHg  Pulse 100  Temp(Src) 98.3 F (36.8 C) (Oral)  Resp 20  Ht 5\' 5"  (1.651 m)  Wt 130 lb (58.968 kg)  BMI 21.63 kg/m2  SpO2 100% Physical Exam  Constitutional: She is oriented to person, place, and time. She appears well-developed.  HENT:  Head: Normocephalic.  Eyes: Conjunctivae and EOM are normal. No scleral icterus.  Neck: Neck supple. No thyromegaly present.  Cardiovascular: Normal rate and regular rhythm.  Exam reveals no gallop and no friction rub.   No murmur heard. Pulmonary/Chest: No stridor. She has  wheezes. She has no rales. She exhibits no tenderness.  Abdominal: She exhibits no distension. There is no tenderness. There is no rebound.  Musculoskeletal: Normal range of motion. She exhibits no edema.  Lymphadenopathy:    She has no cervical adenopathy.  Neurological: She is oriented to person, place, and time. She exhibits normal muscle tone. Coordination normal.  Skin: No rash noted. No erythema.  Psychiatric: She has a normal mood and affect. Her behavior is normal.    ED Course  Procedures (including critical care time) Labs Review Labs Reviewed  COMPREHENSIVE METABOLIC PANEL - Abnormal; Notable for the following:    CO2 33 (*)    Glucose, Bld 103 (*)    All other components within normal limits  CBC WITH DIFFERENTIAL/PLATELET    Imaging Review Dg Chest 2 View  01/01/2016  CLINICAL DATA:  Chest pressure for 3 weeks. History of COPD, CHF and hypertension. History of smoking. EXAM: CHEST  2 VIEW COMPARISON:  04/12/2015; 04/05/2015; 08/24/2013; chest CT - 08/24/2013 FINDINGS: Unchanged cardiac silhouette and mediastinal contours. Marked improved aeration of the lungs. There is minimal pleural parenchymal thickening about the right minor fissure. No focal airspace opacities. No pleural effusion or pneumothorax. No evidence of edema. No acute osseus abnormalities. Post lower cervical ACDF, incompletely evaluated. IMPRESSION: No acute cardiopulmonary disease. Electronically Signed   By: Simonne Come M.D.   On: 01/01/2016 11:47   I have personally reviewed and evaluated these images and lab results as part of my medical decision-making.   EKG Interpretation   Date/Time:  Friday January 01 2016 13:15:44 EST Ventricular Rate:  89 PR Interval:  124 QRS Duration: 86 QT Interval:  377 QTC Calculation: 459 R Axis:   73 Text Interpretation:  Sinus rhythm Borderline repolarization abnormality  Confirmed by Geanine Vandekamp  MD, [place] [phone]) on 01/01/2016 2:59:41 PM      MDM   Final  diagnoses:  COPD exacerbation (HCC)    Patient with bronchitis and COPD exacerbation she will sent home on albuterol prednisone and Z-Pak    Bethann Berkshire, MD 01/01/16 1551

## 2016-01-01 NOTE — Discharge Instructions (Signed)
Follow up if not improving

## 2016-01-01 NOTE — ED Notes (Signed)
C/o wheezing for last 3 weeks.  C/o coug(non-productive).  C/o sore throat.[name]  History of  COPD.

## 2016-01-01 NOTE — ED Notes (Signed)
Pt with several week of dyspnea.worse in the last couple of days

## 2017-03-15 ENCOUNTER — Telehealth (INDEPENDENT_AMBULATORY_CARE_PROVIDER_SITE_OTHER): Payer: Self-pay | Admitting: Specialist
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
## 2017-03-17 ENCOUNTER — Encounter (INDEPENDENT_AMBULATORY_CARE_PROVIDER_SITE_OTHER): Payer: Self-pay | Admitting: Specialist

## 2017-03-17 NOTE — Telephone Encounter (Signed)
lmom for Lurena Joiner that the letter is ready for pick up at the front desk

## 2017-03-17 NOTE — Telephone Encounter (Signed)
Letter concerning this patients surgery dates printed and signed for patient to pick up. jen

## 2017-06-09 MED FILL — clonazePAM 0.5 MG TABS: 0.5 | 15 days supply | Qty: 30 | Fill #0

## 2017-06-09 MED FILL — BUPRENORPHINE HCL-NALOXONE: 8-2 | 7 days supply | Qty: 21 | Fill #0

## 2017-06-15 MED FILL — BUPRENORPHINE HCL-NALOXONE: 8-2 | 14 days supply | Qty: 42 | Fill #0

## 2017-06-27 MED FILL — BUPRENORPHINE HCL-NALOXONE: 8-2 | 28 days supply | Qty: 84 | Fill #0

## 2017-07-25 MED FILL — BUPRENORPHINE HCL-NALOXONE: 8-2 | 28 days supply | Qty: 84 | Fill #0

## 2017-07-25 MED FILL — PROMETHAZINE 25 MG TABLET: 25 | 30 days supply | Qty: 90 | Fill #0

## 2017-08-22 MED FILL — BUPRENORPHINE HCL-NALOXONE: 8-2 | 28 days supply | Qty: 84 | Fill #0

## 2017-08-22 MED FILL — PROMETHAZINE 25 MG TABLET: 25 | 30 days supply | Qty: 90 | Fill #0

## 2017-08-25 ENCOUNTER — Emergency Department (HOSPITAL_COMMUNITY)
Admission: EM | Admit: 2017-08-25 | Discharge: 2017-08-25 | Disposition: A | Discharge status: Home / Self Care | Payer: 59 | Attending: Emergency Medicine | Admitting: Emergency Medicine

## 2017-08-25 ENCOUNTER — Emergency Department (HOSPITAL_COMMUNITY): Payer: 59

## 2017-08-25 ENCOUNTER — Encounter (HOSPITAL_COMMUNITY): Payer: Self-pay | Admitting: Emergency Medicine

## 2017-08-25 DIAGNOSIS — Z5321 Procedure and treatment not carried out due to patient leaving prior to being seen by health care provider: Secondary | ICD-10-CM | POA: Insufficient documentation

## 2017-08-25 DIAGNOSIS — R05 Cough: Secondary | ICD-10-CM | POA: Insufficient documentation

## 2017-08-25 HISTORY — DX: Diverticulitis of intestine, part unspecified, without perforation or abscess without bleeding: K57.92

## 2017-08-25 NOTE — ED Triage Notes (Signed)
Patient complaining of cough and congestion with fever x 2 weeks.

## 2017-08-25 NOTE — ED Notes (Signed)
Notified by registration that patient left.  

## 2017-08-26 ENCOUNTER — Encounter (HOSPITAL_COMMUNITY): Payer: Self-pay | Admitting: Emergency Medicine

## 2017-08-26 ENCOUNTER — Emergency Department (HOSPITAL_COMMUNITY)
Admission: EM | Admit: 2017-08-26 | Discharge: 2017-08-26 | Disposition: A | Discharge status: Home Health | Payer: 59 | Attending: Emergency Medicine | Admitting: Emergency Medicine

## 2017-08-26 DIAGNOSIS — F17210 Nicotine dependence, cigarettes, uncomplicated: Secondary | ICD-10-CM | POA: Insufficient documentation

## 2017-08-26 DIAGNOSIS — E119 Type 2 diabetes mellitus without complications: Secondary | ICD-10-CM | POA: Diagnosis not present

## 2017-08-26 DIAGNOSIS — E785 Hyperlipidemia, unspecified: Secondary | ICD-10-CM | POA: Insufficient documentation

## 2017-08-26 DIAGNOSIS — Z79899 Other long term (current) drug therapy: Secondary | ICD-10-CM | POA: Insufficient documentation

## 2017-08-26 DIAGNOSIS — I509 Heart failure, unspecified: Secondary | ICD-10-CM | POA: Insufficient documentation

## 2017-08-26 DIAGNOSIS — E039 Hypothyroidism, unspecified: Secondary | ICD-10-CM | POA: Diagnosis not present

## 2017-08-26 DIAGNOSIS — R05 Cough: Secondary | ICD-10-CM | POA: Diagnosis present

## 2017-08-26 DIAGNOSIS — Z8673 Personal history of transient ischemic attack (TIA), and cerebral infarction without residual deficits: Secondary | ICD-10-CM | POA: Diagnosis not present

## 2017-08-26 DIAGNOSIS — J441 Chronic obstructive pulmonary disease with (acute) exacerbation: Secondary | ICD-10-CM

## 2017-08-26 DIAGNOSIS — G35 Multiple sclerosis: Secondary | ICD-10-CM | POA: Insufficient documentation

## 2017-08-26 DIAGNOSIS — I110 Hypertensive heart disease with heart failure: Secondary | ICD-10-CM | POA: Diagnosis not present

## 2017-08-26 MED ORDER — AZITHROMYCIN 250 MG PO TABS
500.0000 mg | ORAL_TABLET | Freq: Once | ORAL | Status: AC
  Administered 2017-08-26: 500 mg via ORAL
  Filled 2017-08-26: qty 2

## 2017-08-26 MED ORDER — ALBUTEROL SULFATE (2.5 MG/3ML) 0.083% IN NEBU
5.0000 mg | INHALATION_SOLUTION | Freq: Once | RESPIRATORY_TRACT | Status: AC
  Administered 2017-08-26: 5 mg via RESPIRATORY_TRACT
  Filled 2017-08-26: qty 6

## 2017-08-26 MED ORDER — AZITHROMYCIN 250 MG PO TABS: ORAL_TABLET | ORAL | 0 refills | Status: DC

## 2017-08-26 MED ORDER — ALBUTEROL SULFATE (2.5 MG/3ML) 0.083% IN NEBU
5.0000 mg | INHALATION_SOLUTION | Freq: Once | RESPIRATORY_TRACT | Status: AC
  Administered 2017-08-26: 2.5 mg via RESPIRATORY_TRACT
  Filled 2017-08-26: qty 6

## 2017-08-26 MED ORDER — PREDNISONE 20 MG PO TABS
ORAL_TABLET | ORAL | Status: AC
  Administered 2017-08-26: 10 mg
  Filled 2017-08-26: qty 1

## 2017-08-26 MED ORDER — BENZONATATE 100 MG PO CAPS
200.0000 mg | ORAL_CAPSULE | Freq: Once | ORAL | Status: AC
  Administered 2017-08-26: 200 mg via ORAL
  Filled 2017-08-26: qty 2

## 2017-08-26 MED ORDER — IPRATROPIUM-ALBUTEROL 0.5-2.5 (3) MG/3ML IN SOLN
3.0000 mL | Freq: Once | RESPIRATORY_TRACT | Status: AC
  Administered 2017-08-26: 3 mL via RESPIRATORY_TRACT
  Filled 2017-08-26 (×2): qty 3

## 2017-08-26 MED ORDER — ALBUTEROL SULFATE HFA 108 (90 BASE) MCG/ACT IN AERS
1.0000 | INHALATION_SPRAY | Freq: Once | RESPIRATORY_TRACT | Status: AC
  Administered 2017-08-26: 1 via RESPIRATORY_TRACT
  Filled 2017-08-26: qty 6.7

## 2017-08-26 MED ORDER — PREDNISONE 50 MG PO TABS
ORAL_TABLET | ORAL | Status: AC
  Administered 2017-08-26: 50 mg
  Filled 2017-08-26: qty 1

## 2017-08-26 MED ORDER — PREDNISONE 50 MG PO TABS: 60.0000 mg | ORAL_TABLET | Freq: Once | ORAL | Status: DC

## 2017-08-26 MED ORDER — PREDNISONE 20 MG PO TABS: ORAL_TABLET | ORAL | 0 refills | Status: DC

## 2017-08-26 MED ORDER — ALBUTEROL SULFATE (5 MG/ML) 0.5% IN NEBU: 5.0000 mg | INHALATION_SOLUTION | Freq: Four times a day (QID) | RESPIRATORY_TRACT | 1 refills | Status: DC | PRN

## 2017-08-26 MED ORDER — BENZONATATE 100 MG PO CAPS: 200.0000 mg | ORAL_CAPSULE | Freq: Three times a day (TID) | ORAL | 0 refills | Status: DC | PRN

## 2017-08-26 NOTE — Discharge Instructions (Signed)
Take your next dose of the prednisone and the Zithromax tomorrow. Continue using your nebulizer or your inhaler,in 4 hours if needed for wheezing or shortness of breath.

## 2017-08-26 NOTE — ED Triage Notes (Signed)
Pt c/o productive cough x 2 weeks.

## 2017-08-26 NOTE — ED Provider Notes (Signed)
[facility] EMERGENCY DEPARTMENT Provider Note   [name]: [id number] Arrival date & time: 08/26/17  1331     History   Chief Complaint Chief Complaint  Patient presents with  . Cough    HPI DORTHEY DEPACE is a 56 y.o. female with a medical history as outlined below, most significant for asthma/copd and chf presenting with a 2 week history of persistent cough productive of green sputum, shortness of breath with wheezing, fever to 102 which has not responded to her daily nebulizer treatments.  She presented here yesterday but the wait times were excessive.  She left, but did obtain a cxr prior to leaving.  She denies ankle edema, orthopnea, chest pain or pressure, also no n/v or abdominal pain.  The history is provided by the patient.    Past Medical History:  Diagnosis Date  . Anxiety   . Asthma    Albuterol prn   . CHF (congestive heart failure) (HCC)    takes Furosemide daily  . Chronic back pain    DDD/scoliosis  . Complication of anesthesia    hard to wake up  . COPD (chronic obstructive pulmonary disease) (HCC)   . DDD (degenerative disc disease)   . Depression    takes Celexa,Clonazepam daily  . Diabetes mellitus    borderline  . Diverticulitis   . Edema of lower extremity   . Emphysema lung (HCC)   . Fibromyalgia   . Gastritis   . GERD (gastroesophageal reflux disease)    takes Pepcid daily  . Gout   . Gout of big toe    RIGHT  . Hemorrhoids   . Hiatal hernia   . History of bronchitis    last time 4-32months ago   . History of colon polyps   . History of kidney stones    is in her left kidney and has been for couple of yrs and no problems  . HTN (hypertension)    takes lisinopril daily  . Hyperlipidemia    but doesn't take any meds  . Hypothyroidism    takes Synthroid daily  . Insomnia    doesn't take any meds for this  . Joint pain   . Joint swelling   . Migraine    has one now  . Migraine   . MS (multiple sclerosis) (HCC)    early stages  .  Muscle spasm    takes Flexeril daily as needed  . Neuropathy   . Osteoporosis   . Phlebitis   . Pneumonia    hx of;last time in 2014  . PONV (postoperative nausea and vomiting)   . Shortness of breath    with exertion or stressed   . Sleep apnea    sleep study done at least 10yrs ago;has CPAP but doesn't use  . Stroke [facility])    left sided weakness  . TIA (transient ischemic attack)    Hx: of  . Urinary frequency     Patient Active Problem List   Diagnosis Date Noted  . Feeling of chest tightness 04/12/2015  . SOB (shortness of breath) 04/12/2015  . Hyperglycemia 04/08/2015  . Tobacco use disorder 04/08/2015  . Headache   . Left-sided weakness   . Hyperlipidemia 04/06/2015  . Migraine 04/05/2015  . Acute CVA (cerebrovascular accident) (HCC) 04/05/2015  . Acute blood loss anemia   . Hematochezia 11/15/2014  . Bilateral lower abdominal cramping 11/15/2014  . Rectal bleeding 11/15/2014  . Colitis, acute 11/15/2014  . Malnutrition of moderate  degree (HCC) 04/17/2014  . Postoperative anemia due to acute blood loss 04/17/2014    Class: Acute  . Malnourished (HCC) 04/17/2014    Class: Present on Admission  . 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 Spondylolisthesis at L4-L5 level 04/14/2014  . Spondylosis of cervical spine 12/30/2013  . Cervical spondylosis without myelopathy 12/30/2013    Class: Chronic  . Spondylolisthesis of lumbar region 12/30/2013    Class: Chronic  . CAP (community acquired pneumonia) 08/24/2013  . GERD (gastroesophageal reflux disease) 08/24/2013  . COPD (chronic obstructive pulmonary disease) (HCC) 08/24/2013  . Chronic pain syndrome 08/24/2013  . Cigarette smoker 08/24/2013  . Chest pain 08/24/2013  . COPD exacerbation (HCC) 08/11/2012  . DM (diabetes mellitus) (HCC) 08/11/2012  . Leucocytosis 08/11/2012  . Hypertension 08/11/2012    Past Surgical History:  Procedure Laterality Date  .  ABDOMINAL HYSTERECTOMY    . ANTERIOR CERVICAL DECOMP/DISCECTOMY FUSION N/A 12/30/2013   Procedure: ANTERIOR CERVICAL DISCECTOMY FUSION C4-5, C5-6, plate and screws, allograft, local bone graft;  Surgeon: Kerrin Champagne, MD;  Location: [facility] OR;  Service: Orthopedics;  Laterality: N/A;  . BACK SURGERY     lumbar discectomy  . BIOPSY N/A 01/20/2015   Procedure: COLON BIOPSY;  Surgeon: West Bali, MD;  Location: [facility] ORS;  Service: Endoscopy;  Laterality: N/A;  . BLADDER SUSPENSION    . CARDIAC CATHETERIZATION    . CARPAL TUNNEL RELEASE Right   . CHOLECYSTECTOMY    . COLONOSCOPY    . COLONOSCOPY WITH PROPOFOL N/A 01/20/2015   SLF: 1. Normal ileum 2. Redundant left colon 3. Smalll internal hemorrhoids.   . ESOPHAGOGASTRODUODENOSCOPY  10/29/07   normal  . fundic gland polyp     benign  . POLYPECTOMY N/A 01/20/2015   Procedure: RECTAL POLYPECTOMY;  Surgeon: West Bali, MD;  Location: [facility] ORS;  Service: Endoscopy;  Laterality: N/A;  . SIGMOIDOSCOPY  01/31/08   large internal hemorrhoids/small rectal polyp removed/rare sigmoid diverticula    OB History    Gravida Para Term Preterm AB Living   2 2 0 2   2   SAB TAB Ectopic Multiple Live Births                   Home Medications    Prior to Admission medications   Medication Sig Start Date End Date Taking? Authorizing Provider  albuterol (PROVENTIL) (5 MG/ML) 0.5% nebulizer solution Take 1 mL (5 mg total) by nebulization every 6 (six) hours as needed for wheezing or shortness of breath. 08/26/17   Burgess Amor, PA-C  ALPRAZolam Prudy Feeler) 1 MG tablet Take 1 mg by mouth 2 (two) times daily.     [provider]  Aspirin-Acetaminophen-Caffeine (GOODY HEADACHE PO) Take 1 Package by mouth 2 (two) times daily.    [provider]  azithromycin (ZITHROMAX) 250 MG tablet Take one tablet daily for 4 days 08/27/17   Burgess Amor, PA-C  benzonatate (TESSALON) 100 MG capsule Take 2 capsules (200 mg total) by mouth 3 (three) times daily as  needed. 08/26/17   Burgess Amor, PA-C  butalbital-acetaminophen-caffeine (FIORICET, ESGIC) 50-325-40 MG per tablet Take 2 tablets by mouth every 4 (four) hours as needed for headache. 04/08/15   Black, Lesle Chris, NP  citalopram (CELEXA) 40 MG tablet Take 40 mg by mouth daily.    [provider]  clonazePAM (KLONOPIN) 1 MG tablet Take 1 mg by mouth 3 (three) times daily. 11/24/15  [provider]  cyclobenzaprine (FLEXERIL) 10 MG tablet Take 10 mg by mouth 3 (three) times daily as needed for muscle spasms.    [provider]  famotidine (PEPCID) 20 MG tablet Take 2 tablets (40 mg total) by mouth daily. 04/08/15   Black, Lesle ChrisKaren M, NP  Fluticasone Furoate-Vilanterol (BREO ELLIPTA) 100-25 MCG/INH AEPB Inhale 1 puff into the lungs daily.    [provider]  furosemide (LASIX) 20 MG tablet Take 10 mg by mouth daily.     [provider]  ibuprofen (ADVIL,MOTRIN) 800 MG tablet Take 800 mg by mouth 3 (three) times daily. 12/15/15   [provider]  levothyroxine (SYNTHROID, LEVOTHROID) 125 MCG tablet Take 125 mcg by mouth daily before breakfast.    [provider]  lisinopril (PRINIVIL,ZESTRIL) 40 MG tablet Take 40 mg by mouth daily.    [provider]  ondansetron (ZOFRAN ODT) 4 MG disintegrating tablet 4mg  ODT q4 hours prn nausea/vomit 01/01/16   Bethann BerkshireZammit, Joseph, MD  predniSONE (DELTASONE) 20 MG tablet Take 3 tablets daily PO for 4 days 08/27/17   Burgess AmorIdol, Hassen Bruun, PA-C  promethazine (PHENERGAN) 25 MG tablet Take 25 mg by mouth 3 (three) times daily. 12/15/15   [provider]  SUBOXONE 8-2 MG FILM Place 0.5 Film under the tongue 3 (three) times daily. 12/29/15   [provider]  trazodone (DESYREL) 300 MG tablet Take 150-300 mg by mouth at bedtime. 12/15/15   [provider]    Family History Family History  Problem Relation Age of Onset  . Diabetes Mother   . Hypertension Mother   . Arthritis Father   . Diabetes Sister     . Neuropathy Sister   . Transient ischemic attack Sister   . Heart disease Sister   . Hypertension Sister   . Cancer - Lung Other   . Cancer - Prostate Other   . Cancer - Other Other   . Colon cancer Paternal Uncle   . Colon cancer Brother        deceased age 58 from colon CA and brain CA    Social History Social History  Substance Use Topics  . Smoking status: Current Some Day Smoker    Packs/day: 0.50    Years: 15.00    Types: Cigarettes  . Smokeless tobacco: Never Used  . Alcohol use No     Allergies   Demerol [meperidine]; Imitrex [sumatriptan]; Penicillins; Sulfa antibiotics; Levaquin [levofloxacin in d5w]; Neurontin [gabapentin]; Toradol [ketorolac tromethamine]; Tramadol; and Doxycycline   Review of Systems Review of Systems  Constitutional: Positive for fever.  HENT: Negative for congestion and sore throat.   Eyes: Negative.   Respiratory: Positive for cough, shortness of breath and wheezing. Negative for chest tightness.   Cardiovascular: Negative for chest pain and leg swelling.  Gastrointestinal: Negative for abdominal pain, nausea and vomiting.  Genitourinary: Negative.   Musculoskeletal: Negative for arthralgias, joint swelling and neck pain.  Skin: Negative.  Negative for rash and wound.  Neurological: Negative for dizziness, weakness, light-headedness, numbness and headaches.  Psychiatric/Behavioral: Negative.      Physical Exam Updated Vital Signs BP (!) 167/89 (BP Location: Right Arm)   Pulse 77   Temp 97.8 F (36.6 C) (Oral)   Resp 18   Ht 5\' 4"  (1.626 m)   Wt 59 kg (130 lb)   SpO2 96%   BMI 22.31 kg/m   Physical Exam  Constitutional: She appears well-developed and well-nourished.  HENT:  Head: Normocephalic and atraumatic.  Eyes: Conjunctivae are normal.  Neck: Normal range of motion.  Cardiovascular: Normal rate, regular rhythm, normal heart sounds and intact distal pulses.   Pulmonary/Chest: Effort normal. She has decreased breath  sounds. She has wheezes in the left upper field. She has no rhonchi. She has no rales.  Abdominal: Soft. Bowel sounds are normal. There is no tenderness.  Musculoskeletal: Normal range of motion.  Neurological: She is alert.  Skin: Skin is warm and dry.  Psychiatric: She has a normal mood and affect.  Nursing note and vitals reviewed.    ED Treatments / Results  Labs (all labs ordered are listed, but only abnormal results are displayed) Labs Reviewed - No data to display  EKG  EKG Interpretation None       Radiology Dg Chest 2 View  Result Date: 08/25/2017 CLINICAL DATA:  Cough for 2 weeks EXAM: CHEST  2 VIEW COMPARISON:  01/01/2016 FINDINGS: Cardiac shadow is within normal limits. The lungs are well aerated bilaterally. No focal infiltrate or sizable effusion is seen. No acute bony abnormality is noted. Postsurgical changes in the cervical spine are seen. IMPRESSION: No active cardiopulmonary disease. Electronically Signed   By: Alcide Clever M.D.   On: 08/25/2017 15:29    Procedures Procedures (including critical care time)  Medications Ordered in ED Medications  albuterol (PROVENTIL) (2.5 MG/3ML) 0.083% nebulizer solution 5 mg (2.5 mg Nebulization Given 08/26/17 1512)  ipratropium-albuterol (DUONEB) 0.5-2.5 (3) MG/3ML nebulizer solution 3 mL (3 mLs Nebulization Given 08/26/17 1513)  benzonatate (TESSALON) capsule 200 mg (200 mg Oral Given 08/26/17 1502)  azithromycin (ZITHROMAX) tablet 500 mg (500 mg Oral Given 08/26/17 1503)  predniSONE (DELTASONE) 50 MG tablet (50 mg  Given 08/26/17 1502)  predniSONE (DELTASONE) 20 MG tablet (10 mg  Given 08/26/17 1503)  albuterol (PROVENTIL) (2.5 MG/3ML) 0.083% nebulizer solution 5 mg (5 mg Nebulization Given 08/26/17 1531)  albuterol (PROVENTIL HFA;VENTOLIN HFA) 108 (90 Base) MCG/ACT inhaler 1 puff (1 puff Inhalation Given 08/26/17 1531)     Initial Impression / Assessment and Plan / ED Course  I have reviewed the triage vital  signs and the nursing notes.  Pertinent labs & imaging results that were available during my care of the patient were reviewed by me and considered in my medical decision making (see chart for details).     Pt given albuterol/atrovent nebs, tessalon, prednisone with improved aeration and resolved wheezing after 2nd neb tx.  CXR from yesterday reviewed, no acute finding including edema or infiltrate.  She was prescribed remaining z pack, tessalon, prednisone pulse dosing.  She also asked for albuterol neb solution which was provided.  Plan f/u here for worsened sx.  She was given referral for obtaining local pcp.  Final Clinical Impressions(s) / ED Diagnoses   Final diagnoses:  Chronic obstructive pulmonary disease with acute exacerbation (HCC)    New Prescriptions New Prescriptions   ALBUTEROL (PROVENTIL) (5 MG/ML) 0.5% NEBULIZER SOLUTION    Take 1 mL (5 mg total) by nebulization every 6 (six) hours as needed for wheezing or shortness of breath.   AZITHROMYCIN (ZITHROMAX) 250 MG TABLET    Take one tablet daily for 4 days   BENZONATATE (TESSALON) 100 MG CAPSULE    Take 2 capsules (200 mg total) by mouth 3 (three) times daily as needed.   PREDNISONE (DELTASONE) 20 MG TABLET    Take 3 tablets daily PO for 4 days     Victoriano Lain 08/26/17 1649    Azalia Bilis, MD 08/27/17  0819  

## 2017-09-11 ENCOUNTER — Ambulatory Visit (INDEPENDENT_AMBULATORY_CARE_PROVIDER_SITE_OTHER): Payer: 59 | Admitting: Family Medicine

## 2017-09-11 ENCOUNTER — Other Ambulatory Visit: Payer: Self-pay | Admitting: Family Medicine

## 2017-09-11 ENCOUNTER — Encounter: Payer: Self-pay | Admitting: Family Medicine

## 2017-09-11 VITALS — BP 140/78 | HR 77 | Temp 97.7°F | Resp 16 | Ht 64.0 in | Wt 132.0 lb

## 2017-09-11 DIAGNOSIS — E782 Mixed hyperlipidemia: Secondary | ICD-10-CM | POA: Diagnosis not present

## 2017-09-11 DIAGNOSIS — J449 Chronic obstructive pulmonary disease, unspecified: Secondary | ICD-10-CM | POA: Diagnosis not present

## 2017-09-11 DIAGNOSIS — F419 Anxiety disorder, unspecified: Secondary | ICD-10-CM

## 2017-09-11 DIAGNOSIS — F1120 Opioid dependence, uncomplicated: Secondary | ICD-10-CM

## 2017-09-11 DIAGNOSIS — J439 Emphysema, unspecified: Secondary | ICD-10-CM

## 2017-09-11 DIAGNOSIS — E119 Type 2 diabetes mellitus without complications: Secondary | ICD-10-CM

## 2017-09-11 DIAGNOSIS — Z23 Encounter for immunization: Secondary | ICD-10-CM | POA: Diagnosis not present

## 2017-09-11 DIAGNOSIS — I10 Essential (primary) hypertension: Secondary | ICD-10-CM | POA: Diagnosis not present

## 2017-09-11 DIAGNOSIS — E039 Hypothyroidism, unspecified: Secondary | ICD-10-CM

## 2017-09-11 DIAGNOSIS — Z79899 Other long term (current) drug therapy: Secondary | ICD-10-CM

## 2017-09-11 MED ORDER — CLONAZEPAM 0.5 MG PO TABS: 0.5000 mg | ORAL_TABLET | Freq: Three times a day (TID) | ORAL | 0 refills | Status: DC | PRN

## 2017-09-11 MED ORDER — TIOTROPIUM BROMIDE MONOHYDRATE 18 MCG IN CAPS: 18.0000 ug | ORAL_CAPSULE | Freq: Every day | RESPIRATORY_TRACT | 11 refills | Status: DC

## 2017-09-11 NOTE — Progress Notes (Signed)
Patient ID: [name], female    DOB: [birth year], 56 y.o.   MRN: [id number]  Chief Complaint  Patient presents with  . Hypertension  . Hyperlipidemia  . COPD    Allergies Demerol [meperidine]; Imitrex [sumatriptan]; Penicillins; Sulfa antibiotics; Levaquin [levofloxacin in d5w]; Neurontin [gabapentin]; Toradol [ketorolac tromethamine]; Tramadol; and Doxycycline  Subjective:   [name] is a 56 y.o. female who presents to [facility] today.  HPI [name] presents as a new patient visit today to establish care.  She reports that she is also followed at a Suboxone clinic.  Reports that got hooked on pain killers by all of her surgery and then after surgery could not get the pills and was buying them off the street. Reports that could not keep doing it b/c it was wrecking her family. Reports that she depleted their savings and sold lots of possessions. Reports that she went to the suboxone clnic in [place] and then transferred to the [facility] in [place], [place]. Gets the suboxone strips at the clinic and takes three strips SL, three times a day.    Reports that she takes the clonipin three times a day and the celexa and the trazodone. Reports that she still hurts all the time with the back pain but she does not take any pain pills. Reports that she has been clean for three years. Has been to [facility] recently and been evaluated by psychologist/counselor and continues to go there for therapy.  Reports she has an upcoming visit in the next month with the psychiatrist for medication management.  Does not want to come off the clonazepam now b/c she feels that she is to stressed out. Reports that eventually she wants to come off of them but not now.  She would like for me to prescribe her clonazepam for her anxiety.  She reports that she also has a diagnosis of COPD.  Reports that she smoked for several years, quit for 30 years and restarted smoking when she got hooked  on pain pills.  Currently uses a long-acting beta agonist and an inhaled corticosteroid twice a day.  Reports she still has trouble breathing.  Is unable to walk briskly on a flat surface without stopping to rest.  Is unable to walk up stairs without stopping to rest.  Reports she was hospitalized for her breathing less than 2 years ago.  Recently seen in the emergency department for a COPD exacerbation.  She reports that she coughs frequently, has sputum production, and does not always want to leave the house because of her breathing state.  Reports that she had breathing studies done approximately 4 years ago and has never been in pulmonary rehab.  She reports that she has had several strokes in the past which has affected her memory.  She takes her aspirin every day.  Reports that she used to be diabetic until she lost weight and reports that she was told she had high cholesterol in the past.  Reports that these have not been checked in a long time.  Has been on Synthroid for many years for hypothyroidism.  Has never had any thyroid surgery or nodules diagnosed in the past.  Takes her medication each day.  Reports it is been many months since she has had her thyroid checked.  Previously received care at the [facility] clinic here in [place].    Past Medical History:  Diagnosis Date  . Anxiety   . Asthma  Albuterol prn   . CHF (congestive heart failure) (HCC)    takes Furosemide daily  . Chronic back pain    DDD/scoliosis  . Complication of anesthesia    hard to wake up  . COPD (chronic obstructive pulmonary disease) (HCC)   . DDD (degenerative disc disease)   . Depression    takes Celexa,Clonazepam daily  . Diabetes mellitus    borderline  . Diverticulitis   . Edema of lower extremity   . Emphysema lung (HCC)   . Fibromyalgia   . Gastritis   . GERD (gastroesophageal reflux disease)    takes Pepcid daily  . Gout   . Gout of big toe    RIGHT  . Hemorrhoids   . Hiatal hernia     . History of bronchitis    last time 4-26months ago   . History of colon polyps   . History of kidney stones    is in her left kidney and has been for couple of yrs and no problems  . HTN (hypertension)    takes lisinopril daily  . Hyperlipidemia    but doesn't take any meds  . Hypothyroidism    takes Synthroid daily  . Insomnia    doesn't take any meds for this  . Joint pain   . Joint swelling   . Migraine    has one now  . Migraine   . MS (multiple sclerosis) (HCC)    early stages  . Muscle spasm    takes Flexeril daily as needed  . Neuropathy   . Osteoporosis   . Phlebitis   . Pneumonia    hx of;last time in 2014  . PONV (postoperative nausea and vomiting)   . Shortness of breath    with exertion or stressed   . Sleep apnea    sleep study done at least 16yrs ago;has CPAP but doesn't use  . Stroke [facility])    left sided weakness  . TIA (transient ischemic attack)    Hx: of  . Urinary frequency     Past Surgical History:  Procedure Laterality Date  . ABDOMINAL HYSTERECTOMY    . BACK SURGERY     lumbar discectomy  . BLADDER SUSPENSION    . CARDIAC CATHETERIZATION    . CARPAL TUNNEL RELEASE Right   . CHOLECYSTECTOMY    . COLONOSCOPY    . ESOPHAGOGASTRODUODENOSCOPY  10/29/07   normal  . fundic gland polyp     benign  . SIGMOIDOSCOPY  01/31/08   large internal hemorrhoids/small rectal polyp removed/rare sigmoid diverticula    Family History  Problem Relation Age of Onset  . Diabetes Mother   . Hypertension Mother   . Arthritis Father   . Diabetes Sister   . Neuropathy Sister   . Transient ischemic attack Sister   . Heart disease Sister   . Hypertension Sister   . Cancer - Lung Other   . Cancer - Prostate Other   . Cancer - Other Other   . Colon cancer Paternal Uncle   . Colon cancer Brother        deceased age 85 from colon CA and brain CA     Social History   Socioeconomic History  . Marital status: Married    Spouse name: None  . Number of  children: None  . Years of education: None  . Highest education level: None  Social Needs  . Financial resource strain: None  . Food insecurity - worry: None  .  Food insecurity - inability: None  . Transportation needs - medical: None  . Transportation needs - non-medical: None  Occupational History  . None  Tobacco Use  . Smoking status: Current Some Day Smoker    Packs/day: 0.50    Years: 15.00    Pack years: 7.50    Types: Cigarettes  . Smokeless tobacco: Never Used  Substance and Sexual Activity  . Alcohol use: No  . Drug use: No  . Sexual activity: Yes    Birth control/protection: Surgical  Other Topics Concern  . None  Social History Narrative   Patient is married and has children.  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  Denies any alcohol or other drug use.  Reports that 1 of her children is also dealing with narcotic addiction and also goes to the Suboxone clinic with her.   Eats meat fruits and vegetables.   Currently smokes.   Not interested in quitting smoking at this time.   Wears seatbelt.    Review of Systems  Constitutional: Negative for activity change, appetite change and fever.  HENT: Negative for sore throat, trouble swallowing and voice change.   Respiratory: Negative for choking, shortness of breath, wheezing and stridor.        Reports that her breathing is much better since she has completed the steroids and the antibiotics.  However, still short of breath with hurrying or trying to complete all of her housework.  Cardiovascular: Negative for chest pain, palpitations and leg swelling.  Gastrointestinal: Negative for abdominal pain, constipation, diarrhea and nausea.  Genitourinary: Negative for difficulty urinating, dysuria, hematuria and urgency.  Musculoskeletal: Positive for arthralgias.       Reports that her body always hurts and has chronic back pain.  No numbness or tingling in her extremities.  Neurological:  Negative for dizziness, tremors, syncope, facial asymmetry, speech difficulty, weakness, light-headedness and headaches.  Hematological: Negative for adenopathy.  Psychiatric/Behavioral: Negative for behavioral problems, dysphoric mood, hallucinations, sleep disturbance and suicidal ideas. The patient is nervous/anxious. The patient is not hyperactive.      Objective:   BP 140/78 (BP Location: Left Arm, Patient Position: Sitting, Cuff Size: Normal)   Pulse 77   Temp 97.7 F (36.5 C) (Other (Comment))   Resp 16   Ht 5\' 4"  (1.626 m)   Wt 132 lb (59.9 kg)   SpO2 96%   BMI 22.66 kg/m   Physical Exam  Constitutional: She appears well-developed and well-nourished.  HENT:  Head: Normocephalic and atraumatic.  Nose: Nose normal.  Eyes: Conjunctivae and EOM are normal. Pupils are equal, round, and reactive to light.  Neck: Normal range of motion. Neck supple. No thyromegaly present.  Cardiovascular: Normal rate, regular rhythm and normal heart sounds.  Pulmonary/Chest: Effort normal and breath sounds normal.  Abdominal: Soft. Bowel sounds are normal.  Neurological: She is alert. She has normal strength. No cranial nerve deficit or sensory deficit.  Skin: Skin is warm, dry and intact.  Psychiatric: Her behavior is normal. Judgment normal. Her mood appears anxious. Her affect is not blunt, not labile and not inappropriate. Her speech is not rapid and/or pressured. She expresses no homicidal and no suicidal ideation. She expresses no suicidal plans and no homicidal plans.  Vitals reviewed.    Assessment and Plan  1. Pulmonary emphysema, unspecified emphysema type (HCC) Tobacco cessation recommended.  We discussed that her lung function will continue to decline and her symptoms will worsen unless she quit  smoking. - Pulmonary rehab therapeutic exercise; Future  2. Essential hypertension Controlled today.  Continue lisinopril as directed.  Check electrolytes and renal function.  3.  Chronic obstructive pulmonary disease, unspecified COPD type (HCC) Patient currently on long-acting beta agonist, inhaled corticosteroid, and uses nebulizer at home.  Will add long-acting muscarinic agonist at this time.  Counseled on use of Spiriva.Patient counseled in detail regarding the risks of medication. Told to call or return to clinic if develop any worrisome signs or symptoms. Patient voiced understanding.   - Pulmonary rehab therapeutic exercise; Future  4. Hypothyroidism, unspecified type Check labs today.  Continue current therapy. - TSH  5. High risk medication use Check in anticipation of needing statin therapy secondary to history of stroke. - Hepatic function panel  6. Mixed hyperlipidemia Check labs.Lifestyle modifications discussed with patient including a diet emphasizing vegetables, fruits, and whole grains. Limiting intake of sodium to less than 2,400 mg per day.  Recommendations discussed include consuming low-fat dairy products, poultry, fish, legumes, non-tropical vegetable oils, and nuts; and limiting intake of sweets, sugar-sweetened beverages, and red meat. Discussed following a plan such as the Dietary Approaches to Stop Hypertension (DASH) diet. Patient to read up on this diet.   - Lipid panel  7. Diabetes mellitus without complication (HCC) History of diabetes but reportedly now controlled secondary to weight loss.  Check labs today. - Basic metabolic panel - Hemoglobin A1c  8. Anxiety Believe her anxiety is uncontrolled at this time.  We discussed that benzodiazepines are not a good medication for control of anxiety.  I will refill her clonazepam at this time.  We discussed that she will need to get future refills at [facility] by the psychiatrist.  She was counseled that if her mood changes, worsens, or develops any suicidal ideations to please return to clinic.  9. Narcotic addiction [facility](HCC) Patient will continue follow-up visit at Suboxone clinic.  We  discussed this today.  We also discussed that in light of her opioid addiction that being on benzodiazepines which do have an abuse potential is not a great idea.  She voiced understanding of this. Tobacco abuse was discussed.  She is not ready to quit.The 5 A's Model for treating Tobacco Use and Dependence was used today. I have identified and documented tobacco use status for this patient. I have urged the patient to quit tobacco use. At this time, the patient is unwilling and not ready to attempt to quit. I have provided patient with information regarding risks, cessation techniques, and interventions that might increase future attempts to quit smoking. I will plan on again addressing tobacco dependence at the next visit.   Return in about 4 weeks (around 10/09/2017) for breathing/bp. Aliene Beamsachel Isack Lavalley, MD 09/11/2017

## 2017-09-12 LAB — BASIC METABOLIC PANEL WITH GFR
BUN: 11 mg/dL (ref 7–25)
CO2: 32 mmol/L (ref 20–32)
CREATININE: 0.77 mg/dL (ref 0.50–1.05)
Calcium: 8.8 mg/dL (ref 8.6–10.4)
Chloride: 105 mmol/L (ref 98–110)
GFR, Est African American: 99 mL/min/{1.73_m2} (ref >=60)
GFR, Est Non African American: 85 mL/min/{1.73_m2} (ref >=60)
GLUCOSE: 82 mg/dL (ref 65–99)
POTASSIUM: 4 mmol/L (ref 3.5–5.3)
SODIUM: 142 mmol/L (ref 135–146)

## 2017-09-12 LAB — HEMOGLOBIN A1C
HEMOGLOBIN A1C: 5.4 %{Hb} (ref <5.7)
Mean Plasma Glucose: 108 (calc)
eAG (mmol/L): 6 (calc)

## 2017-09-12 LAB — LIPID PANEL
CHOL/HDL RATIO: 6.8 (calc) — AB (ref <5.0)
Cholesterol: 272 mg/dL — ABNORMAL HIGH (ref <200)
HDL: 40 mg/dL — ABNORMAL LOW (ref >50)
LDL CHOLESTEROL (CALC): 207 mg/dL — AB
Non-HDL Cholesterol (Calc): 232 mg/dL (calc) — ABNORMAL HIGH (ref <130)
TRIGLYCERIDES: 116 mg/dL (ref <150)

## 2017-09-12 LAB — HEPATIC FUNCTION PANEL
AG Ratio: 1.5 (calc) (ref 1.0–2.5)
ALBUMIN MSPROF: 3.7 g/dL (ref 3.6–5.1)
ALT: 8 U/L (ref 6–29)
AST: 11 U/L (ref 10–35)
Alkaline phosphatase (APISO): 73 U/L (ref 33–130)
BILIRUBIN DIRECT: 0 mg/dL (ref 0.0–0.2)
BILIRUBIN INDIRECT: 0.3 mg/dL (ref 0.2–1.2)
BILIRUBIN TOTAL: 0.3 mg/dL (ref 0.2–1.2)
Globulin: 2.5 g/dL (calc) (ref 1.9–3.7)
Total Protein: 6.2 g/dL (ref 6.1–8.1)

## 2017-09-12 LAB — TSH: TSH: 34.6 mIU/L — ABNORMAL HIGH (ref 0.40–4.50)

## 2017-09-15 ENCOUNTER — Encounter: Payer: Self-pay | Admitting: Family Medicine

## 2017-09-15 ENCOUNTER — Telehealth: Payer: Self-pay | Admitting: Family Medicine

## 2017-09-15 MED ORDER — LEVOTHYROXINE SODIUM 150 MCG PO TABS: 150.0000 ug | ORAL_TABLET | Freq: Every day | ORAL | 0 refills | Status: DC

## 2017-09-15 NOTE — Telephone Encounter (Signed)
Please call and advise that her thyroid is very low functioning. I am going to increase her thyroid medication to 150 mcg a day. Stop the other medication, synthroid 125 mcg and start the new medication.  Advise that she need to follow up in the next couple weeks to discuss her cholesterol and blood sugars. They are elevated. Please advise to make an appointment.   [facility] dose [facility] to the [facility] in [place].  Janine Limboachel H. Tracie HarrierHagler, MD

## 2017-09-18 NOTE — Telephone Encounter (Signed)
Called patient regarding message below. No answer, unable to leave message.  

## 2017-09-18 NOTE — Telephone Encounter (Signed)
Called patient regarding message below. No answer, left generic message for patient to return call.   

## 2017-09-19 MED FILL — SUBOXONE 8 MG-2 MG SL FILM: 8-2 | 28 days supply | Qty: 84 | Fill #0

## 2017-09-19 MED FILL — PROMETHAZINE 25 MG TABLET: 25 | 30 days supply | Qty: 90 | Fill #0

## 2017-09-19 NOTE — Telephone Encounter (Signed)
I have been unable to reach this patient by phone.  A letter is being sent.

## 2017-10-11 ENCOUNTER — Ambulatory Visit: Payer: Self-pay | Admitting: Family Medicine

## 2017-10-12 ENCOUNTER — Telehealth: Payer: Self-pay | Admitting: *Deleted

## 2017-10-12 NOTE — Telephone Encounter (Signed)
Spoke to patient. She has not yet seen psychiatry. Her firs appointment is 12/7. She was calling for klonopin, not celexa.

## 2017-10-12 NOTE — Telephone Encounter (Signed)
Who has she been seeing for her psychiatry? Has she had any changes in her mood or is she stable on her medication. I will refill for her. Please call and make sure she is doing well and refill for one month if she is doing ok.  Janine Limbo. Tracie Harrier, MD

## 2017-10-12 NOTE — Telephone Encounter (Signed)
Patient called left message stating she doesn't see the psychiatrist until 12/17 and she would like if Dr Tracie Harrier could refill her celexa until then. Please advise

## 2017-10-12 NOTE — Telephone Encounter (Signed)
The appointment is tomorrow with psychiatry. She can wait until seen by psychiatrist. I gave her script to cover her until that visit. Janine Limboachel H. Tracie HarrierHagler, MD

## 2017-10-13 ENCOUNTER — Telehealth: Payer: Self-pay | Admitting: *Deleted

## 2017-10-13 NOTE — Telephone Encounter (Signed)
Patient called left message regarding her klonopin. Please advise [phone]

## 2017-10-13 NOTE — Telephone Encounter (Signed)
Called patient regarding message below. No answer, unable to leave message.  

## 2017-10-17 NOTE — Telephone Encounter (Signed)
Called patient regarding message below. No answer, unable to leave message.  

## 2017-10-18 ENCOUNTER — Encounter: Payer: Self-pay | Admitting: Family Medicine

## 2017-10-18 MED FILL — SUBOXONE 8 MG-2 MG SL FILM: 8-2 | 7 days supply | Qty: 21 | Fill #0

## 2017-10-24 ENCOUNTER — Telehealth: Payer: Self-pay | Admitting: Family Medicine

## 2017-10-24 NOTE — Telephone Encounter (Signed)
I spoke to patient, she was prescribed abilify by Dr. Selena BattenKim. She states that he has told her he will not prescribe the anxiety medication and that she needs to get that from us. I informed her that we do not prescribe that medication. She states she sees [facility] in [place]. I called to get a copy of the office notes from her visit yesterday to get more clarification on the situation. They state they need a signed release from the patient to send us the information. I spoke to patient, after she leaves the Suboxone clinic today, she will come by and sign a release for us to fax to [facility] to get those records.

## 2017-10-24 NOTE — Telephone Encounter (Signed)
Thank you. Just let me know when we receive the records.  I did inform patient from her first visit that she would not be receiving this medication from our office. She told me she did not need it and it would be prescribed by her psychiatry doctors. In addition, she did not keep her last scheduled visit with me. She no-showed the appointment.

## 2017-10-24 NOTE — Telephone Encounter (Signed)
Patient came in today requesting a cpe before the end of the year. We do not have availability however I added her and her husband to the cancellation list. She states she needs this by 11/07/17 or her insurance will go up in price.

## 2017-10-24 NOTE — Telephone Encounter (Signed)
Patient went to psychiatry yesterday, she was put on new medications she would like to discuss with you, also they are requesting for Dr.Hagler to take over her nerve pills (klonopin) she  states the Dr. Fredna Dow she should take xanex or valuum    Cb#: [phone]

## 2017-10-26 ENCOUNTER — Other Ambulatory Visit: Payer: Self-pay | Admitting: Family Medicine

## 2017-11-02 ENCOUNTER — Encounter: Payer: Self-pay | Admitting: Family Medicine

## 2017-11-02 ENCOUNTER — Ambulatory Visit (INDEPENDENT_AMBULATORY_CARE_PROVIDER_SITE_OTHER): Payer: 59 | Admitting: Family Medicine

## 2017-11-02 ENCOUNTER — Other Ambulatory Visit: Payer: Self-pay

## 2017-11-02 VITALS — BP 145/86 | HR 70 | Temp 98.7°F | Resp 16 | Ht 64.0 in | Wt 127.2 lb

## 2017-11-02 DIAGNOSIS — M797 Fibromyalgia: Secondary | ICD-10-CM | POA: Insufficient documentation

## 2017-11-02 DIAGNOSIS — Z113 Encounter for screening for infections with a predominantly sexual mode of transmission: Secondary | ICD-10-CM

## 2017-11-02 DIAGNOSIS — Z23 Encounter for immunization: Secondary | ICD-10-CM | POA: Diagnosis not present

## 2017-11-02 DIAGNOSIS — Z1239 Encounter for other screening for malignant neoplasm of breast: Secondary | ICD-10-CM

## 2017-11-02 DIAGNOSIS — R5383 Other fatigue: Secondary | ICD-10-CM | POA: Diagnosis not present

## 2017-11-02 DIAGNOSIS — E1149 Type 2 diabetes mellitus with other diabetic neurological complication: Secondary | ICD-10-CM | POA: Diagnosis not present

## 2017-11-02 DIAGNOSIS — Z0000 Encounter for general adult medical examination without abnormal findings: Secondary | ICD-10-CM

## 2017-11-02 DIAGNOSIS — Z1231 Encounter for screening mammogram for malignant neoplasm of breast: Secondary | ICD-10-CM

## 2017-11-02 DIAGNOSIS — E039 Hypothyroidism, unspecified: Secondary | ICD-10-CM | POA: Diagnosis not present

## 2017-11-02 DIAGNOSIS — E1169 Type 2 diabetes mellitus with other specified complication: Secondary | ICD-10-CM | POA: Diagnosis not present

## 2017-11-02 DIAGNOSIS — E785 Hyperlipidemia, unspecified: Secondary | ICD-10-CM

## 2017-11-02 MED ORDER — CYCLOBENZAPRINE HCL 10 MG PO TABS: 10.0000 mg | ORAL_TABLET | Freq: Three times a day (TID) | ORAL | 1 refills | Status: DC | PRN

## 2017-11-02 MED ORDER — CLONAZEPAM 0.5 MG PO TABS: 0.5000 mg | ORAL_TABLET | Freq: Three times a day (TID) | ORAL | 0 refills | Status: DC | PRN

## 2017-11-02 MED ORDER — ATORVASTATIN CALCIUM 20 MG PO TABS: 20.0000 mg | ORAL_TABLET | Freq: Every day | ORAL | 3 refills | Status: DC

## 2017-11-02 NOTE — Progress Notes (Signed)
Patient ID: [name], female    DOB: Aug 10, 1961, 56 y.o.   MRN: [id number]  Chief Complaint  Patient presents with  . Annual Exam    Allergies Demerol [meperidine]; Imitrex [sumatriptan]; Penicillins; Sulfa antibiotics; Levaquin [levofloxacin in d5w]; Neurontin [gabapentin]; Toradol [ketorolac tromethamine]; Tramadol; and Doxycycline  Subjective:   [name] is a 56 y.o. female who presents to [facility] today.  HPI Here for annual wellness exam that is required for insurance. Sees the psychiatrist and psychologist every 2 weeks at [facility] in [place]. Has follow up with them again next week. Reports that she does not want a pap, pelvic, or breast exam today. She reports that she is going to return for those exams, but her husband is in the hospital at this time and she is not prepared to do this today.  Reports that she was recently seen by psychiatry and put on abilify but has not been able to tolerate the medication due to gastrointestinal side effects.  Reports that she needs a refill on her Flexeril that she uses for her fibromyalgia.  Reports that has been on clonazepam for years and that her psychiatrist would like for her to continue the medication but it be prescribed by our office. She reports that he put this in his notes and is having them sent to our office. She requests a refill of this medication.   Reports was diagnosed with diabetes many years ago but after losing weight came off all medications.  Denies any lesions on her feet.  Has not been to an eye doctor in many years.  No history of kidney disease.  Still smoking cigarettes each day and is not interested in quitting.  Uses 2 inhalers for COPD.  Reports an occasional cough and no sputum production.  Able to walk without shortness of breath.  Denies any chest pain, shortness of breath, or edema.    Past Medical History:  Diagnosis Date  . Anxiety   . Asthma    Albuterol prn   . CHF  (congestive heart failure) (HCC)    takes Furosemide daily  . Chronic back pain    DDD/scoliosis  . Complication of anesthesia    hard to wake up  . COPD (chronic obstructive pulmonary disease) (HCC)   . DDD (degenerative disc disease)   . Depression    takes Celexa,Clonazepam daily  . Diabetes mellitus    borderline  . Diverticulitis   . Edema of lower extremity   . Emphysema lung (HCC)   . Fibromyalgia   . Gastritis   . GERD (gastroesophageal reflux disease)    takes Pepcid daily  . Gout   . Gout of big toe    RIGHT  . Hemorrhoids   . Hiatal hernia   . History of bronchitis    last time 4-22months ago   . History of colon polyps   . History of kidney stones    is in her left kidney and has been for couple of yrs and no problems  . HTN (hypertension)    takes lisinopril daily  . Hyperlipidemia    but doesn't take any meds  . Hypothyroidism    takes Synthroid daily  . Insomnia    doesn't take any meds for this  . Joint pain   . Joint swelling   . Migraine    has one now  . Migraine   . MS (multiple sclerosis) (HCC)    early stages  .  Muscle spasm    takes Flexeril daily as needed  . Neuropathy   . Osteoporosis   . Phlebitis   . Pneumonia    hx of;last time in 2014  . PONV (postoperative nausea and vomiting)   . Shortness of breath    with exertion or stressed   . Sleep apnea    sleep study done at least 4758yrs ago;has CPAP but doesn't use  . Stroke [facility](HCC)    left sided weakness  . TIA (transient ischemic attack)    Hx: of  . Urinary frequency     Past Surgical History:  Procedure Laterality Date  . ABDOMINAL HYSTERECTOMY    . ANTERIOR CERVICAL DECOMP/DISCECTOMY FUSION N/A 12/30/2013   Procedure: ANTERIOR CERVICAL DISCECTOMY FUSION C4-5, C5-6, plate and screws, allograft, local bone graft;  Surgeon: Kerrin ChampagneJames E Nitka, MD;  Location: [facility] OR;  Service: Orthopedics;  Laterality: N/A;  . BACK SURGERY     lumbar discectomy  . BIOPSY N/A 01/20/2015   Procedure:  COLON BIOPSY;  Surgeon: West BaliSandi L Fields, MD;  Location: [facility] ORS;  Service: Endoscopy;  Laterality: N/A;  . BLADDER SUSPENSION    . CARDIAC CATHETERIZATION    . CARPAL TUNNEL RELEASE Right   . CHOLECYSTECTOMY    . COLONOSCOPY    . COLONOSCOPY WITH PROPOFOL N/A 01/20/2015   SLF: 1. Normal ileum 2. Redundant left colon 3. Smalll internal hemorrhoids.   . ESOPHAGOGASTRODUODENOSCOPY  10/29/07   normal  . fundic gland polyp     benign  . POLYPECTOMY N/A 01/20/2015   Procedure: RECTAL POLYPECTOMY;  Surgeon: West BaliSandi L Fields, MD;  Location: [facility] ORS;  Service: Endoscopy;  Laterality: N/A;  . SIGMOIDOSCOPY  01/31/08   large internal hemorrhoids/small rectal polyp removed/rare sigmoid diverticula    Family History  Problem Relation Age of Onset  . Diabetes Mother   . Hypertension Mother   . Arthritis Father   . Diabetes Sister   . Neuropathy Sister   . Transient ischemic attack Sister   . Heart disease Sister   . Hypertension Sister   . Cancer - Lung Other   . Cancer - Prostate Other   . Cancer - Other Other   . Colon cancer Paternal Uncle   . Colon cancer Brother        deceased age 58 from colon CA and brain CA     Social History   Socioeconomic History  . Marital status: Married    Spouse name: None  . Number of children: None  . Years of education: None  . Highest education level: None  Social Needs  . Financial resource strain: None  . Food insecurity - worry: None  . Food insecurity - inability: None  . Transportation needs - medical: None  . Transportation needs - non-medical: None  Occupational History  . None  Tobacco Use  . Smoking status: Current Some Day Smoker    Packs/day: 0.50    Years: 15.00    Pack years: 7.50    Types: Cigarettes  . Smokeless tobacco: Never Used  Substance and Sexual Activity  . Alcohol use: No  . Drug use: No  . Sexual activity: Yes    Birth control/protection: Surgical  Other Topics Concern  . None  Social History Narrative    Patient is married and has children.  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  Denies any alcohol or other drug use.  Reports that 1 of her children is also  dealing with narcotic addiction and also goes to the Suboxone clinic with her.   Eats meat fruits and vegetables.   Currently smokes.   Not interested in quitting smoking at this time.   Wears seatbelt.    Review of Systems  Constitutional: Negative for activity change, appetite change and fever.  Eyes: Negative for visual disturbance.  Respiratory: Negative for cough, chest tightness and shortness of breath.   Cardiovascular: Negative for chest pain, palpitations and leg swelling.  Gastrointestinal: Negative for abdominal pain, nausea and vomiting.  Genitourinary: Negative for dysuria, frequency and urgency.  Skin: Negative for rash.  Neurological: Negative for dizziness, syncope and light-headedness.  Hematological: Negative for adenopathy.  Psychiatric/Behavioral: The patient is nervous/anxious.        Reports that she has been out of her clonazepam and is been unable to get from our office.  She reports that her psychiatrist wanted this medication filled by [place].  She reports her husband has been hospitalized for a diabetic foot ulcer and is not doing well.  Reports she has been stressed.  Denies any suicidal or homicidal ideations.     Objective:   BP (!) 170/90 (BP Location: Left Arm, Patient Position: Sitting, Cuff Size: Normal)   Pulse 70   Temp 98.7 F (37.1 C)   Resp 16   Ht 5\' 4"  (1.626 m)   Wt 127 lb 4 oz (57.7 kg)   SpO2 97%   BMI 21.84 kg/m   Physical Exam  Constitutional: She is oriented to person, place, and time. She appears well-developed and well-nourished. No distress.  HENT:  Head: Normocephalic and atraumatic.  Right Ear: External ear normal.  Left Ear: External ear normal.  Nose: Nose normal.  Mouth/Throat: Oropharynx is clear and moist. No oropharyngeal exudate.    Upper dentures present.  No lower teeth or dentures present.  Eyes: Conjunctivae and EOM are normal. Pupils are equal, round, and reactive to light. No scleral icterus.  Neck: Normal range of motion. Neck supple. No JVD present. No tracheal deviation present. No thyromegaly present.  Cardiovascular: Normal rate, regular rhythm, normal heart sounds and intact distal pulses.  Pulses:      Dorsalis pedis pulses are 2+ on the right side, and 2+ on the left side.       Posterior tibial pulses are 2+ on the right side, and 2+ on the left side.  Pulmonary/Chest: Effort normal and breath sounds normal. No stridor. No respiratory distress. She has no wheezes.  Abdominal: Soft. Bowel sounds are normal. She exhibits no distension and no mass. There is no tenderness. There is no guarding.  Musculoskeletal: Normal range of motion. She exhibits no edema.  Feet:  Right Foot:  Protective Sensation: 7 sites tested. 7 sites sensed.  Skin Integrity: Positive for dry skin. Negative for ulcer, blister, skin breakdown, erythema, warmth or callus.  Left Foot:  Protective Sensation: 7 sites tested. 7 sites sensed.  Skin Integrity: Positive for dry skin. Negative for ulcer, blister, skin breakdown, erythema, warmth or callus.  Lymphadenopathy:    She has no cervical adenopathy.  Neurological: She is alert and oriented to person, place, and time. No cranial nerve deficit.  Skin: Skin is warm and dry. Capillary refill takes less than 2 seconds. No rash noted. No erythema. No pallor.  Psychiatric: She has a normal mood and affect.  Nursing note and vitals reviewed.    Assessment and Plan   1. Well adult exam Age-appropriate anticipatory guidance given and discussed with  patient today. Tobacco cessation recommended. The 5 A's Model for treating Tobacco Use and Dependence was used today. I have identified and documented tobacco use status for this patient. I have urged the patient to quit tobacco use. At this  time, the patient is unwilling and not ready to attempt to quit. I have provided patient with information regarding risks, cessation techniques, and interventions that might increase future attempts to quit smoking. I will plan on again addressing tobacco dependence at the next visit.   2. Immunization due  - Tdap vaccine greater than or equal to 7yo IM  3. Breast cancer screening Patient will return for clinical breast exam. - MM SCREENING BREAST TOMO BILATERAL; Future  4. Hyperlipidemia associated with type 2 diabetes mellitus (HCC) Last cholesterol panel reviewed.  Patient willing to start statins at this time.Hyperlipidemia and the associated risk of ASCVD were discussed today. Primary vs. Secondary prevention of ASCVD were discussed and how it relates to patient morbidity, mortality, and quality of life. Shared decision making with patient including the risks of statins vs.benefits of ASCVD risk reduction discussed.  Risks of stains discussed including myopathy, rhabdomyoloysis, liver problems, increased risk of diabetes discussed. We discussed heart healthy diet, lifestyle modifications, risk factor modifications, and adherence to the recommended treatment plan. We discussed the need to periodically monitor lipid panel and liver function tests while on statin therapy.   - atorvastatin (LIPITOR) 20 MG tablet; Take 1 tablet (20 mg total) by mouth daily.  Dispense: 90 tablet; Refill: 3 -Plan to recheck FLP in 2-3 months.  Check repeat hepatic function at that time. 5. Hypothyroidism, unspecified type Thyroid dose increased to 150 mcg 2 months ago.  Recheck TSH today. - TSH  6. Screen for STD (sexually transmitted disease)  - Hepatitis panel, acute - HIV antibody - RPR  7. Type 2 diabetes mellitus with neurological complications [facility]) Patient well controlled with hemoglobin A1c less than 5.3% of all medications.  Foot exam performed today.  Continuation of diet recommended. - Ambulatory  referral to Ophthalmology  8. Fatigue, unspecified type Intermittent. - CBC with Differential/Platelet - Vitamin B12 - VITAMIN D 25 Hydroxy (Vit-D Deficiency, Fractures)  9. Fibromyalgia Patient counseled in detail regarding the risks of medication. Told to call or return to clinic if develop any worrisome signs or symptoms. Patient voiced understanding.   - cyclobenzaprine (FLEXERIL) 10 MG tablet; Take 1 tablet (10 mg total) by mouth 3 (three) times daily as needed for muscle spasms.  Dispense: 60 tablet; Refill: 1 10. HTN Compliance with medication was recommended.  She reports that she has not been taking her medications regularly because her husband was in the hospital.  She will follow back up for recheck when she comes back in for her Pap, pelvic, clinical breast exam. 11.  Mood disorder/anxiety Continue scheduled visits with psychiatry and psychologist at [facility] in [place]. Patient will have records from her psychiatrist sent to our office.  She was told that that if the notes do say that it is recommended she be on clonazepam  that I will continue to refill her clonazepam.  She was given some clonazepam until we get the records.Patient counseled in detail regarding the risks of medication. Told to call or return to clinic if develop any worrisome signs or symptoms. Patient voiced understanding.   Return in about 4 weeks (around 11/30/2017) for follow up pap smear. Aliene Beams, MD 11/02/2017

## 2017-11-15 ENCOUNTER — Encounter: Payer: Self-pay | Admitting: Family Medicine

## 2017-11-15 ENCOUNTER — Ambulatory Visit (INDEPENDENT_AMBULATORY_CARE_PROVIDER_SITE_OTHER): Payer: 59 | Admitting: Family Medicine

## 2017-11-15 ENCOUNTER — Other Ambulatory Visit: Payer: Self-pay

## 2017-11-15 VITALS — BP 182/90 | HR 86 | Temp 98.9°F | Resp 17 | Ht 66.0 in | Wt 130.0 lb

## 2017-11-15 DIAGNOSIS — I10 Essential (primary) hypertension: Secondary | ICD-10-CM

## 2017-11-15 DIAGNOSIS — J209 Acute bronchitis, unspecified: Secondary | ICD-10-CM | POA: Diagnosis not present

## 2017-11-15 DIAGNOSIS — I509 Heart failure, unspecified: Secondary | ICD-10-CM | POA: Diagnosis not present

## 2017-11-15 DIAGNOSIS — F419 Anxiety disorder, unspecified: Secondary | ICD-10-CM

## 2017-11-15 DIAGNOSIS — J441 Chronic obstructive pulmonary disease with (acute) exacerbation: Secondary | ICD-10-CM

## 2017-11-15 DIAGNOSIS — J440 Chronic obstructive pulmonary disease with acute lower respiratory infection: Secondary | ICD-10-CM | POA: Diagnosis not present

## 2017-11-15 MED ORDER — LISINOPRIL 40 MG PO TABS: 40.0000 mg | ORAL_TABLET | Freq: Every day | ORAL | 1 refills | Status: DC

## 2017-11-15 MED ORDER — AZITHROMYCIN 250 MG PO TABS: ORAL_TABLET | ORAL | 0 refills | Status: DC

## 2017-11-15 MED ORDER — IPRATROPIUM BROMIDE 0.02 % IN SOLN
0.5000 mg | Freq: Once | RESPIRATORY_TRACT | Status: AC
  Administered 2017-11-15: 0.5 mg via RESPIRATORY_TRACT

## 2017-11-15 MED ORDER — AMLODIPINE BESYLATE 10 MG PO TABS: 10.0000 mg | ORAL_TABLET | Freq: Every day | ORAL | 3 refills | Status: DC

## 2017-11-15 MED ORDER — ALBUTEROL SULFATE (2.5 MG/3ML) 0.083% IN NEBU
2.5000 mg | INHALATION_SOLUTION | Freq: Once | RESPIRATORY_TRACT | Status: AC
  Administered 2017-11-15: 2.5 mg via RESPIRATORY_TRACT

## 2017-11-15 MED ORDER — PREDNISONE 20 MG PO TABS: ORAL_TABLET | ORAL | 0 refills | Status: DC

## 2017-11-15 MED ORDER — FUROSEMIDE 20 MG PO TABS: 10.0000 mg | ORAL_TABLET | Freq: Every day | ORAL | 1 refills | Status: DC

## 2017-11-15 NOTE — Progress Notes (Signed)
Patient ID: [name], female    DOB: February 07, 1961, 57 y.o.   MRN: [id number]  Chief Complaint  Patient presents with  . Cough  . Wheezing  . Nasal Congestion    Allergies Demerol [meperidine]; Imitrex [sumatriptan]; Penicillins; Sulfa antibiotics; Levaquin [levofloxacin in d5w]; Neurontin [gabapentin]; Toradol [ketorolac tromethamine]; Tramadol; and Doxycycline  Subjective:   [name] is a 57 y.o. female who presents to [facility] today.  HPI [name] presents today with a complaint of cough and sputum production.  She reports that for the past several weeks she has been coughing, wheezing, and having a harder time with her breathing.  She reports she has been using all of her inhalers on a daily basis and even been using her albuterol nebulizer machine at home once or twice a day.  She reports that her cough is consistent throughout the day and productive of greenish gray sputum.  She reports that she feels more winded than she normally does with walking.  No shortness of breath at rest.  Able to talk in complete sentences.  Denies any chest pain.  Reports that her chest is been sore from continued coughing.  Reports she possibly could have had some subjective fevers but she has not checked them.  Is eating and drinking well.  Was last seen in the emergency department approximately 4 months ago for COPD exacerbation.  Reports that she usually has to go on steroids a couple times a year due to her breathing.  She also reports that her blood pressures been running high.  She reports that even before she started the over-the-counter cold medicine that her blood pressure was running high.  She reports that 3 weeks ago she was at [facility] and her systolic blood pressure was in the 180s.  She reports she has not taken her lisinopril today.  She has not taken her Lasix and several weeks.  Patient also reports that she would like a refill on her Xanax today.  She said that  her psychiatrist, Dr. Marcial Pacas was supposed to send the records here saying that he was requesting for [place] to fill the Xanax rather than her getting it from his office.  She reports that the notes were going to tell me why he wanted to give her the Xanax rather than being prescribed by him.  She reports we should have received those records by now.   Cough  This is a new problem. The current episode started 1 to 4 weeks ago. The problem has been gradually worsening. The problem occurs constantly. The cough is productive of purulent sputum. Associated symptoms include chills, a fever, nasal congestion, shortness of breath and wheezing. Pertinent negatives include no chest pain, ear congestion, ear pain, headaches, hemoptysis, myalgias, postnasal drip, rash, rhinorrhea, sore throat, sweats or weight loss. The symptoms are aggravated by cold air. She has tried a beta-agonist inhaler and OTC cough suppressant for the symptoms. The treatment provided mild relief. Her past medical history is significant for bronchitis, COPD and pneumonia.    Past Medical History:  Diagnosis Date  . Anxiety   . Asthma    Albuterol prn   . CHF (congestive heart failure) (HCC)    takes Furosemide daily  . Chronic back pain    DDD/scoliosis  . Complication of anesthesia    hard to wake up  . COPD (chronic obstructive pulmonary disease) (HCC)   . DDD (degenerative disc disease)   . Depression  takes Celexa,Clonazepam daily  . Diabetes mellitus    borderline  . Diverticulitis   . Edema of lower extremity   . Emphysema lung (HCC)   . Fibromyalgia   . Gastritis   . GERD (gastroesophageal reflux disease)    takes Pepcid daily  . Gout   . Gout of big toe    RIGHT  . Hemorrhoids   . Hiatal hernia   . History of bronchitis    last time 4-17months ago   . History of colon polyps   . History of kidney stones    is in her left kidney and has been for couple of yrs and no problems  . HTN (hypertension)    takes  lisinopril daily  . Hyperlipidemia    but doesn't take any meds  . Hypothyroidism    takes Synthroid daily  . Insomnia    doesn't take any meds for this  . Joint pain   . Joint swelling   . Migraine    has one now  . Migraine   . MS (multiple sclerosis) (HCC)    early stages  . Muscle spasm    takes Flexeril daily as needed  . Neuropathy   . Osteoporosis   . Phlebitis   . Pneumonia    hx of;last time in 2014  . PONV (postoperative nausea and vomiting)   . Shortness of breath    with exertion or stressed   . Sleep apnea    sleep study done at least 53yrs ago;has CPAP but doesn't use  . Stroke [facility])    left sided weakness  . TIA (transient ischemic attack)    Hx: of  . Urinary frequency     Past Surgical History:  Procedure Laterality Date  . ABDOMINAL HYSTERECTOMY    . ANTERIOR CERVICAL DECOMP/DISCECTOMY FUSION N/A 12/30/2013   Procedure: ANTERIOR CERVICAL DISCECTOMY FUSION C4-5, C5-6, plate and screws, allograft, local bone graft;  Surgeon: Kerrin Champagne, MD;  Location: [facility] OR;  Service: Orthopedics;  Laterality: N/A;  . BACK SURGERY     lumbar discectomy  . BIOPSY N/A 01/20/2015   Procedure: COLON BIOPSY;  Surgeon: West Bali, MD;  Location: [facility] ORS;  Service: Endoscopy;  Laterality: N/A;  . BLADDER SUSPENSION    . CARDIAC CATHETERIZATION    . CARPAL TUNNEL RELEASE Right   . CHOLECYSTECTOMY    . COLONOSCOPY    . COLONOSCOPY WITH PROPOFOL N/A 01/20/2015   SLF: 1. Normal ileum 2. Redundant left colon 3. Smalll internal hemorrhoids.   . ESOPHAGOGASTRODUODENOSCOPY  10/29/07   normal  . fundic gland polyp     benign  . POLYPECTOMY N/A 01/20/2015   Procedure: RECTAL POLYPECTOMY;  Surgeon: West Bali, MD;  Location: [facility] ORS;  Service: Endoscopy;  Laterality: N/A;  . SIGMOIDOSCOPY  01/31/08   large internal hemorrhoids/small rectal polyp removed/rare sigmoid diverticula    Family History  Problem Relation Age of Onset  . Diabetes Mother   . Hypertension Mother     . Arthritis Father   . Diabetes Sister   . Neuropathy Sister   . Transient ischemic attack Sister   . Heart disease Sister   . Hypertension Sister   . Cancer - Lung Other   . Cancer - Prostate Other   . Cancer - Other Other   . Colon cancer Paternal Uncle   . Colon cancer Brother        deceased age 61 from colon CA and brain CA  Social History   Socioeconomic History  . Marital status: Married    Spouse name: None  . Number of children: None  . Years of education: None  . Highest education level: None  Social Needs  . Financial resource strain: None  . Food insecurity - worry: None  . Food insecurity - inability: None  . Transportation needs - medical: None  . Transportation needs - non-medical: None  Occupational History  . None  Tobacco Use  . Smoking status: Current Some Day Smoker    Packs/day: 0.50    Years: 15.00    Pack years: 7.50    Types: Cigarettes  . Smokeless tobacco: Never Used  Substance and Sexual Activity  . Alcohol use: No  . Drug use: No  . Sexual activity: Yes    Birth control/protection: Surgical  Other Topics Concern  . None  Social History Narrative   Patient is married and has children.  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  Denies any alcohol or other drug use.  Reports that 1 of her children is also dealing with narcotic addiction and also goes to the Suboxone clinic with her.   Eats meat fruits and vegetables.   Currently smokes.   Not interested in quitting smoking at this time.   Wears seatbelt.    Review of Systems  Constitutional: Positive for chills and fever. Negative for weight loss.  HENT: Negative for ear pain, postnasal drip, rhinorrhea and sore throat.   Respiratory: Positive for cough, shortness of breath and wheezing. Negative for hemoptysis.   Cardiovascular: Negative for chest pain.  Musculoskeletal: Negative for myalgias.  Skin: Negative for rash.  Neurological: Negative for  headaches.    Current Outpatient Medications on File Prior to Visit  Medication Sig Dispense Refill  . albuterol (PROVENTIL) (5 MG/ML) 0.5% nebulizer solution Take 1 mL (5 mg total) by nebulization every 6 (six) hours as needed for wheezing or shortness of breath. 20 mL 1  . Aspirin-Acetaminophen-Caffeine (GOODY HEADACHE PO) Take 1 Package by mouth 2 (two) times daily.    Marland Kitchen atorvastatin (LIPITOR) 20 MG tablet Take 1 tablet (20 mg total) by mouth daily. 90 tablet 3  . benzonatate (TESSALON) 100 MG capsule Take 2 capsules (200 mg total) by mouth 3 (three) times daily as needed. 30 capsule 0  . citalopram (CELEXA) 40 MG tablet Take 40 mg by mouth daily.    . clonazePAM (KLONOPIN) 0.5 MG tablet Take 1 tablet (0.5 mg total) by mouth 3 (three) times daily as needed for anxiety. 30 tablet 0  . cyclobenzaprine (FLEXERIL) 10 MG tablet Take 1 tablet (10 mg total) by mouth 3 (three) times daily as needed for muscle spasms. 60 tablet 1  . famotidine (PEPCID) 20 MG tablet Take 2 tablets (40 mg total) by mouth daily.    . Fluticasone Furoate-Vilanterol (BREO ELLIPTA) 100-25 MCG/INH AEPB Inhale 1 puff into the lungs daily.    Marland Kitchen ibuprofen (ADVIL,MOTRIN) 800 MG tablet Take 800 mg by mouth 3 (three) times daily.  0  . levothyroxine (SYNTHROID, LEVOTHROID) 150 MCG tablet Take 1 tablet (150 mcg total) daily by mouth. 90 tablet 0  . ondansetron (ZOFRAN ODT) 4 MG disintegrating tablet 4mg  ODT q4 hours prn nausea/vomit 12 tablet 0  . promethazine (PHENERGAN) 25 MG tablet Take 25 mg by mouth 3 (three) times daily.  0  . SUBOXONE 8-2 MG FILM Place 0.5 Film under the tongue 3 (three) times daily.    Marland Kitchen  tiotropium (SPIRIVA HANDIHALER) 18 MCG inhalation capsule Place 1 capsule (18 mcg total) daily into inhaler and inhale. 30 capsule 11  . trazodone (DESYREL) 300 MG tablet Take 150-300 mg by mouth at bedtime.  0  . ARIPiprazole (ABILIFY) 2 MG tablet      No current facility-administered medications on file prior to visit.      Objective:   BP (!) 182/90 (BP Location: Left Arm, Patient Position: Sitting, Cuff Size: Normal)   Pulse 86   Temp 98.9 F (37.2 C) (Temporal)   Resp 17   Ht 5\' 6"  (1.676 m)   Wt 130 lb (59 kg)   SpO2 96%   BMI 20.98 kg/m   Physical Exam  Constitutional: She is oriented to person, place, and time. She appears well-developed and well-nourished.  Patient able to talk in complete sentences.  Smells of tobacco smoke.  HENT:  Head: Normocephalic and atraumatic.  Nose: Nose normal.  Mouth/Throat: Oropharynx is clear and moist. No oropharyngeal exudate.  Eyes: Conjunctivae and EOM are normal. Pupils are equal, round, and reactive to light.  Neck: Normal range of motion. Neck supple. No tracheal deviation present.  Cardiovascular: Normal rate, regular rhythm and normal heart sounds.  Pulmonary/Chest: Effort normal. No stridor. She has wheezes. She has no rales.  Neurological: She is alert and oriented to person, place, and time. No cranial nerve deficit.  Skin: Skin is warm and dry. Capillary refill takes less than 2 seconds.  Psychiatric: She has a normal mood and affect. Her behavior is normal. Judgment and thought content normal.  Vitals reviewed.  Albuterol/ipratropium nebulizer therapy given in office today.  Patient with clearing of wheezes and improved breath sounds status post nebulizer treatment.  Patient had symptomatic improvement and felt better after nebulizer treatment. Assessment and Plan  1. Acute exacerbation of chronic obstructive pulmonary disease (COPD) (HCC) Today had discussion with patient that she has severe COPD.  However, she does continue to smoke cigarettes.  She is already on long-acting beta agonist, inhaled corticosteroid, long-acting muscarinic agent.  Patient is not ready to quit smoking at this time.  I do believe that oral steroids are indicated due to her symptoms and sputum production.  Thyroid taper as directed.  Patient was encouraged to take with  food. Patient counseled in detail regarding the risks of medication. Told to call or return to clinic if develop any worrisome signs or symptoms. Patient voiced understanding.   - predniSONE (DELTASONE) 20 MG tablet; Take 3 pills a day for 3 days, take 2 pills a day for 3 days, take 1 pill a day for 3, take 1/2 pill each day for 3 days  Dispense: 20 tablet; Refill: 0 - ipratropium (ATROVENT) nebulizer solution 0.5 mg - albuterol (PROVENTIL) (2.5 MG/3ML) 0.083% nebulizer solution 2.5 mg  2. Acute bronchitis with COPD (HCC) Treat with medication as directed.  Patient has many antibiotic allergies per her report.  Patient was counseled that if his her symptoms are not improving or worsening that she needs to call or return to clinic.  She was told if she developed shortness of breath or other worrisome symptoms to go to the emergency department.  She voiced understanding. - azithromycin (ZITHROMAX) 250 MG tablet; Take two pills a day for the first day and then one pill a day for the next four days  Dispense: 6 tablet; Refill: 0  3. Essential hypertension Suspect blood pressure elevated today due to noncompliance, over-the-counter cold medications, tobacco abuse, patient being sick, and  uncontrolled hypertension. At this time will add amlodipine 10 mg 1 p.o. daily.  Refills for her other medications sent to the pharmacy.  Compliance with medication discussed. - lisinopril (PRINIVIL,ZESTRIL) 40 MG tablet; Take 1 tablet (40 mg total) by mouth daily.  Dispense: 90 tablet; Refill: 1 - amLODipine (NORVASC) 10 MG tablet; Take 1 tablet (10 mg total) by mouth daily.  Dispense: 90 tablet; Refill: 3 Patient is to follow-up in 2-3 weeks for recheck of blood pressure or sooner if needed. 4. Congestive heart failure, unspecified HF chronicity, unspecified heart failure type (HCC) Compliance with medication discussed.  Refill sent in.  Check potassium at next visit. - furosemide (LASIX) 20 MG tablet; Take 0.5  tablets (10 mg total) by mouth daily.  Dispense: 45 tablet; Refill: 1  5. Anxiety Patient currently being seen at [facility] and by psychiatrist at that facility.  Patient reports her psychiatrist is Dr. Marcial Pacas.  She does report that we should have received records at this time regarding her need for benzodiazepines and the fact that he would like to prescribe our office.  Patient is told me this several times.  We have not yet received any records.  In addition, there are no notes under care everywhere that I can access.  Patient was again given medical release forms and reports that she will have him send the records to our office.  I did tell her that she needs to get refills from him until I have seen this paperwork and agreed to prescribe her this medication. -Continue psychiatric care at [facility]. Return in about 3 weeks (around 12/06/2017) for Follow-up. Aliene Beams, MD 11/15/2017

## 2017-11-20 ENCOUNTER — Telehealth: Payer: Self-pay | Admitting: Family Medicine

## 2017-11-20 NOTE — Telephone Encounter (Signed)
I called [facility] to see what was going on with the previous records request that has been sent, they referred me to their Health Information Management team, [employer].   I spoke to them, they gave me a direct e-mail address to send the request to, and I have sent it.   I also labeled paper '4th Attempt'  I could not find records request at first, and called patient to come sign another, but then it was found.

## 2017-11-20 NOTE — Telephone Encounter (Signed)
Patient is calling to see if you have heard anything from [facility] about filling her klonopin  Cb#: [phone]

## 2017-11-20 NOTE — Telephone Encounter (Signed)
I have not heard anything. I will call. Can you please resend the records release the patient filled out last week?

## 2017-11-23 ENCOUNTER — Other Ambulatory Visit: Payer: Self-pay

## 2017-11-23 ENCOUNTER — Emergency Department (HOSPITAL_COMMUNITY): Payer: 59

## 2017-11-23 ENCOUNTER — Ambulatory Visit (INDEPENDENT_AMBULATORY_CARE_PROVIDER_SITE_OTHER): Payer: 59 | Admitting: Family Medicine

## 2017-11-23 ENCOUNTER — Encounter (HOSPITAL_COMMUNITY): Payer: Self-pay | Admitting: Emergency Medicine

## 2017-11-23 ENCOUNTER — Emergency Department (HOSPITAL_COMMUNITY)
Admission: EM | Admit: 2017-11-23 | Discharge: 2017-11-23 | Disposition: A | Discharge status: Home / Self Care | Payer: 59 | Attending: Emergency Medicine | Admitting: Emergency Medicine

## 2017-11-23 ENCOUNTER — Encounter: Payer: Self-pay | Admitting: Family Medicine

## 2017-11-23 VITALS — BP 126/66 | HR 110 | Temp 98.0°F | Resp 15 | Ht 66.0 in | Wt 128.8 lb

## 2017-11-23 DIAGNOSIS — J449 Chronic obstructive pulmonary disease, unspecified: Secondary | ICD-10-CM | POA: Diagnosis not present

## 2017-11-23 DIAGNOSIS — E785 Hyperlipidemia, unspecified: Secondary | ICD-10-CM | POA: Insufficient documentation

## 2017-11-23 DIAGNOSIS — G35 Multiple sclerosis: Secondary | ICD-10-CM | POA: Insufficient documentation

## 2017-11-23 DIAGNOSIS — N10 Acute tubulo-interstitial nephritis: Secondary | ICD-10-CM | POA: Insufficient documentation

## 2017-11-23 DIAGNOSIS — R1084 Generalized abdominal pain: Secondary | ICD-10-CM | POA: Diagnosis present

## 2017-11-23 DIAGNOSIS — R109 Unspecified abdominal pain: Secondary | ICD-10-CM | POA: Diagnosis not present

## 2017-11-23 DIAGNOSIS — E860 Dehydration: Secondary | ICD-10-CM | POA: Diagnosis not present

## 2017-11-23 DIAGNOSIS — Z79899 Other long term (current) drug therapy: Secondary | ICD-10-CM | POA: Diagnosis not present

## 2017-11-23 DIAGNOSIS — I110 Hypertensive heart disease with heart failure: Secondary | ICD-10-CM | POA: Insufficient documentation

## 2017-11-23 DIAGNOSIS — R112 Nausea with vomiting, unspecified: Secondary | ICD-10-CM | POA: Diagnosis not present

## 2017-11-23 DIAGNOSIS — R1030 Lower abdominal pain, unspecified: Secondary | ICD-10-CM

## 2017-11-23 DIAGNOSIS — F17210 Nicotine dependence, cigarettes, uncomplicated: Secondary | ICD-10-CM | POA: Diagnosis not present

## 2017-11-23 DIAGNOSIS — Z8673 Personal history of transient ischemic attack (TIA), and cerebral infarction without residual deficits: Secondary | ICD-10-CM | POA: Diagnosis not present

## 2017-11-23 DIAGNOSIS — I509 Heart failure, unspecified: Secondary | ICD-10-CM | POA: Insufficient documentation

## 2017-11-23 DIAGNOSIS — E119 Type 2 diabetes mellitus without complications: Secondary | ICD-10-CM | POA: Diagnosis not present

## 2017-11-23 DIAGNOSIS — E039 Hypothyroidism, unspecified: Secondary | ICD-10-CM | POA: Diagnosis not present

## 2017-11-23 LAB — POCT URINALYSIS DIPSTICK
Bilirubin, UA: NEGATIVE
Blood, UA: NEGATIVE
GLUCOSE UA: NEGATIVE
Ketones, UA: NEGATIVE
LEUKOCYTES UA: NEGATIVE
Nitrite, UA: NEGATIVE
Protein, UA: NEGATIVE
Spec Grav, UA: 1.01 (ref 1.010–1.025)
Urobilinogen, UA: 0.2 E.U./dL
pH, UA: 6 (ref 5.0–8.0)

## 2017-11-23 LAB — COMPREHENSIVE METABOLIC PANEL
ALT: 20 U/L (ref 14–54)
AST: 12 U/L — ABNORMAL LOW (ref 15–41)
Albumin: 3.8 g/dL (ref 3.5–5.0)
Alkaline Phosphatase: 78 U/L (ref 38–126)
Anion gap: 10 (ref 5–15)
BILIRUBIN TOTAL: 0.3 mg/dL (ref 0.3–1.2)
BUN: 13 mg/dL (ref 6–20)
CO2: 28 mmol/L (ref 22–32)
Calcium: 9.1 mg/dL (ref 8.9–10.3)
Chloride: 105 mmol/L (ref 101–111)
Creatinine, Ser: 0.8 mg/dL (ref 0.44–1.00)
GFR calc Af Amer: >60 mL/min (ref >60)
Glucose, Bld: 101 mg/dL — ABNORMAL HIGH (ref 65–99)
POTASSIUM: 3.2 mmol/L — AB (ref 3.5–5.1)
Sodium: 143 mmol/L (ref 135–145)
TOTAL PROTEIN: 6.7 g/dL (ref 6.5–8.1)

## 2017-11-23 LAB — CBC
HEMATOCRIT: 39.7 % (ref 36.0–46.0)
Hemoglobin: 13.1 g/dL (ref 12.0–15.0)
MCH: 29.3 pg (ref 26.0–34.0)
MCHC: 33 g/dL (ref 30.0–36.0)
MCV: 88.8 fL (ref 78.0–100.0)
Platelets: 300 10*3/uL (ref 150–400)
RBC: 4.47 MIL/uL (ref 3.87–5.11)
RDW: 14.2 % (ref 11.5–15.5)
WBC: 9.9 10*3/uL (ref 4.0–10.5)

## 2017-11-23 LAB — URINALYSIS, ROUTINE W REFLEX MICROSCOPIC
Bilirubin Urine: NEGATIVE
Glucose, UA: NEGATIVE mg/dL
Hgb urine dipstick: NEGATIVE
KETONES UR: NEGATIVE mg/dL
LEUKOCYTES UA: NEGATIVE
NITRITE: NEGATIVE
PH: 6 (ref 5.0–8.0)
Protein, ur: NEGATIVE mg/dL
Specific Gravity, Urine: 1.006 (ref 1.005–1.030)

## 2017-11-23 LAB — LIPASE, BLOOD: Lipase: 30 U/L (ref 11–51)

## 2017-11-23 MED ORDER — PANTOPRAZOLE SODIUM 40 MG IV SOLR
80.0000 mg | Freq: Once | INTRAVENOUS | Status: AC
  Administered 2017-11-23: 80 mg via INTRAVENOUS
  Filled 2017-11-23: qty 80

## 2017-11-23 MED ORDER — ONDANSETRON 4 MG PO TBDP: ORAL_TABLET | ORAL | 0 refills | Status: DC

## 2017-11-23 MED ORDER — ONDANSETRON 4 MG PO TBDP
4.0000 mg | ORAL_TABLET | Freq: Once | ORAL | Status: AC | PRN
  Administered 2017-11-23: 4 mg via ORAL
  Filled 2017-11-23: qty 1

## 2017-11-23 MED ORDER — IOPAMIDOL (ISOVUE-300) INJECTION 61%
100.0000 mL | Freq: Once | INTRAVENOUS | Status: AC | PRN
  Administered 2017-11-23: 100 mL via INTRAVENOUS

## 2017-11-23 MED ORDER — DIPHENHYDRAMINE HCL 50 MG/ML IJ SOLN
25.0000 mg | Freq: Once | INTRAMUSCULAR | Status: AC
  Administered 2017-11-23: 25 mg via INTRAVENOUS
  Filled 2017-11-23: qty 1

## 2017-11-23 MED ORDER — CIPROFLOXACIN HCL 250 MG PO TABS
500.0000 mg | ORAL_TABLET | Freq: Once | ORAL | Status: AC
  Administered 2017-11-23: 500 mg via ORAL
  Filled 2017-11-23: qty 2

## 2017-11-23 MED ORDER — CIPROFLOXACIN HCL 500 MG PO TABS: 500.0000 mg | ORAL_TABLET | Freq: Two times a day (BID) | ORAL | 0 refills | Status: DC

## 2017-11-23 MED ORDER — ONDANSETRON HCL 4 MG/2ML IJ SOLN
4.0000 mg | Freq: Once | INTRAMUSCULAR | Status: AC
  Administered 2017-11-23: 4 mg via INTRAVENOUS
  Filled 2017-11-23: qty 2

## 2017-11-23 MED ORDER — MORPHINE SULFATE (PF) 4 MG/ML IV SOLN: 4.0000 mg | Freq: Once | INTRAVENOUS | Status: DC

## 2017-11-23 MED ORDER — METOCLOPRAMIDE HCL 5 MG/ML IJ SOLN
10.0000 mg | Freq: Once | INTRAMUSCULAR | Status: AC
  Administered 2017-11-23: 10 mg via INTRAVENOUS
  Filled 2017-11-23: qty 2

## 2017-11-23 MED ORDER — ACETAMINOPHEN 500 MG PO TABS
1000.0000 mg | ORAL_TABLET | Freq: Once | ORAL | Status: AC
  Administered 2017-11-23: 1000 mg via ORAL
  Filled 2017-11-23: qty 2

## 2017-11-23 NOTE — Discharge Instructions (Signed)
We saw you in the ER for the abdominal pain. All of our results are normal, including all labs and imaging. Kidney function is fine as well. We are not sure what is causing your abdominal pain, and recommend that you see your primary care doctor within 2-3 days for further evaluation. If your symptoms get worse, return to the ER. Take the pain meds and nausea meds as prescribed.  We are sending you home with antibiotics for kidney infection.  Please return to the ER if your symptoms worsen; you have increased pain, fevers, chills, inability to keep any medications down, confusion. Otherwise see the outpatient doctor as requested.

## 2017-11-23 NOTE — ED Notes (Signed)
Pt states she is a recovering addict and did not want the morphine at this time

## 2017-11-23 NOTE — Progress Notes (Deleted)
Dysuria x1 week, but worse over 2 days. Cannot eat for several day. Vomiting multiple times. No hemopthysis.

## 2017-11-23 NOTE — ED Notes (Signed)
Pt states protonix did not help with her pain and states she has a headache also. edp aware.

## 2017-11-23 NOTE — Progress Notes (Signed)
Patient ID: [name], female    DOB: 02-01-1961, 57 y.o.   MRN: [id number]  Chief Complaint  Patient presents with  . Back Pain    Allergies Demerol [meperidine]; Imitrex [sumatriptan]; Penicillins; Sulfa antibiotics; Levaquin [levofloxacin in d5w]; Neurontin [gabapentin]; Toradol [ketorolac tromethamine]; Tramadol; and Doxycycline  Subjective:   [name] is a 57 y.o. female who presents to [facility] today.  HPI  [name] presents today because she reports that she is not feeling well.  Reports that for the past couple weeks has had some burning with urination but over the past several days it has worsened significantly.  Reports that she is having painful urination and now pain in her right flank.  She reports that she has been vomiting for the past 2-3 days despite taking Phenergan 3-4 times a day.  She reports that she is only been able to keep down about 10 sips of liquid over the past 48 hours.  She reports that her abdomen is very sore and tender.  She denies any fevers, chills, or rashes.  She just completed antibiotics for COPD exacerbation and has completed steroids also.  She reports that she is breathing better but is in a tremendous amount of pain.  Reports that when she stands up she feels weak and dizzy.  She denies any syncopal episodes.  She denies any chest pain or shortness of breath.  Reports that her pain is a 7 out of 10 and it feels like someone is kicked her in the back.  Denies any vaginal discharge.  No hematuria.  There is nothing that she can do to make the pain better or worse.  She reports that she has not been able to keep her medications down because of the vomiting.  She denies any headaches or vision changes.   Dysuria    This is a new problem. The current episode started 1 to 4 weeks ago. The problem occurs every urination. The problem has been gradually worsening. The quality of the pain is described as burning, shooting and  stabbing. The pain is at a severity of 7/10. The pain is moderate. There has been no fever. She is sexually active. There is a history of pyelonephritis. Associated symptoms include flank pain, frequency, nausea, urgency and vomiting. Pertinent negatives include no chills, discharge, hematuria, hesitancy, possible pregnancy or sweats. She has tried increased fluids for the symptoms. The treatment provided no relief.    Past Medical History:  Diagnosis Date  . Anxiety   . Asthma    Albuterol prn   . CHF (congestive heart failure) (HCC)    takes Furosemide daily  . Chronic back pain    DDD/scoliosis  . Complication of anesthesia    hard to wake up  . COPD (chronic obstructive pulmonary disease) (HCC)   . DDD (degenerative disc disease)   . Depression    takes Celexa,Clonazepam daily  . Diabetes mellitus    borderline  . Diverticulitis   . Edema of lower extremity   . Emphysema lung (HCC)   . Fibromyalgia   . Gastritis   . GERD (gastroesophageal reflux disease)    takes Pepcid daily  . Gout   . Gout of big toe    RIGHT  . Hemorrhoids   . Hiatal hernia   . History of bronchitis    last time 4-78months ago   . History of colon polyps   . History of kidney stones  is in her left kidney and has been for couple of yrs and no problems  . HTN (hypertension)    takes lisinopril daily  . Hyperlipidemia    but doesn't take any meds  . Hypothyroidism    takes Synthroid daily  . Insomnia    doesn't take any meds for this  . Joint pain   . Joint swelling   . Migraine    has one now  . Migraine   . MS (multiple sclerosis) (HCC)    early stages  . Muscle spasm    takes Flexeril daily as needed  . Neuropathy   . Osteoporosis   . Phlebitis   . Pneumonia    hx of;last time in 2014  . PONV (postoperative nausea and vomiting)   . Shortness of breath    with exertion or stressed   . Sleep apnea    sleep study done at least 51yrs ago;has CPAP but doesn't use  . Stroke [facility])     left sided weakness  . TIA (transient ischemic attack)    Hx: of  . Urinary frequency     Past Surgical History:  Procedure Laterality Date  . ABDOMINAL HYSTERECTOMY    . ANTERIOR CERVICAL DECOMP/DISCECTOMY FUSION N/A 12/30/2013   Procedure: ANTERIOR CERVICAL DISCECTOMY FUSION C4-5, C5-6, plate and screws, allograft, local bone graft;  Surgeon: Kerrin Champagne, MD;  Location: [facility] OR;  Service: Orthopedics;  Laterality: N/A;  . BACK SURGERY     lumbar discectomy  . BIOPSY N/A 01/20/2015   Procedure: COLON BIOPSY;  Surgeon: West Bali, MD;  Location: [facility] ORS;  Service: Endoscopy;  Laterality: N/A;  . BLADDER SUSPENSION    . CARDIAC CATHETERIZATION    . CARPAL TUNNEL RELEASE Right   . CHOLECYSTECTOMY    . COLONOSCOPY    . COLONOSCOPY WITH PROPOFOL N/A 01/20/2015   SLF: 1. Normal ileum 2. Redundant left colon 3. Smalll internal hemorrhoids.   . ESOPHAGOGASTRODUODENOSCOPY  10/29/07   normal  . fundic gland polyp     benign  . POLYPECTOMY N/A 01/20/2015   Procedure: RECTAL POLYPECTOMY;  Surgeon: West Bali, MD;  Location: [facility] ORS;  Service: Endoscopy;  Laterality: N/A;  . SIGMOIDOSCOPY  01/31/08   large internal hemorrhoids/small rectal polyp removed/rare sigmoid diverticula    Family History  Problem Relation Age of Onset  . Diabetes Mother   . Hypertension Mother   . Arthritis Father   . Diabetes Sister   . Neuropathy Sister   . Transient ischemic attack Sister   . Heart disease Sister   . Hypertension Sister   . Cancer - Lung Other   . Cancer - Prostate Other   . Cancer - Other Other   . Colon cancer Paternal Uncle   . Colon cancer Brother        deceased age 46 from colon CA and brain CA     Social History   Socioeconomic History  . Marital status: Married    Spouse name: None  . Number of children: None  . Years of education: None  . Highest education level: None  Social Needs  . Financial resource strain: None  . Food insecurity - worry: None  . Food  insecurity - inability: None  . Transportation needs - medical: None  . Transportation needs - non-medical: None  Occupational History  . None  Tobacco Use  . Smoking status: Current Some Day Smoker    Packs/day: 0.50    Years:  15.00    Pack years: 7.50    Types: Cigarettes  . Smokeless tobacco: Never Used  Substance and Sexual Activity  . Alcohol use: No  . Drug use: No  . Sexual activity: Yes    Birth control/protection: Surgical  Other Topics Concern  . None  Social History Narrative   Patient is married and has children.  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  Denies any alcohol or other drug use.  Reports that 1 of her children is also dealing with narcotic addiction and also goes to the Suboxone clinic with her.   Eats meat fruits and vegetables.   Currently smokes.   Not interested in quitting smoking at this time.   Wears seatbelt.    Review of Systems  Constitutional: Negative for chills.  Respiratory:       Reports that her breathing has improved.  Cardiovascular: Negative for chest pain, palpitations and leg swelling.  Gastrointestinal: Positive for abdominal pain, nausea and vomiting. Negative for abdominal distention, blood in stool, constipation, diarrhea and rectal pain.  Genitourinary: Positive for dysuria, flank pain, frequency and urgency. Negative for difficulty urinating, hematuria and hesitancy.  Musculoskeletal: Negative for myalgias.       Reports that the right side of her back is hurting her very badly.  Skin: Negative for rash.  Neurological: Negative for tremors, speech difficulty and numbness.  Hematological: Negative for adenopathy. Does not bruise/bleed easily.  Psychiatric/Behavioral: The patient is nervous/anxious.      Objective:   BP 126/66 (BP Location: Left Arm, Patient Position: Sitting, Cuff Size: Normal)   Pulse (!) 110   Temp 98 F (36.7 C) (Temporal)   Resp 15   Ht 5\' 6"  (1.676 m)   Wt 128 lb  12 oz (58.4 kg)   SpO2 98%   BMI 20.78 kg/m    Physical Exam  Constitutional: Vital signs are normal. She has a sickly appearance. She appears ill. No distress.  Ill-appearing female, thin, older than stated age hunched over due to abdominal pain.  Gait unsteady upon standing.  HENT:  Head: Normocephalic and atraumatic.  Eyes: Pupils are equal, round, and reactive to light.  Neck: Normal range of motion. Neck supple.  Cardiovascular: Normal rate, regular rhythm and normal heart sounds.  Pulmonary/Chest: Effort normal. She has wheezes.  Abdominal: Soft. Bowel sounds are normal. There is tenderness. There is guarding.  Suprapubic tenderness to palpation and lower abdominal tenderness to palpation.  Upon repeat exam patient tender in right upper quadrant and right lower quadrant.  Patient with right-sided CVA tenderness to palpation.  Neurological: She is alert.  Skin: Skin is warm and dry.  Psychiatric: Her speech is normal and behavior is normal. Thought content normal. Her mood appears anxious.   Blood pressure sitting 126/70, pulse 91.  Blood pressure standing 116/66.  Assessment and Plan   1. Flank pain 2. Dehydration 3. Intractable vomiting with nausea, unspecified vomiting type 4. Lower abdominal pain  Patient presenting with several day history of abdominal pain and associated nausea and vomiting despite use of anti-medics.  Unable to tolerate solids or liquids with evidence of orthostasis and dehydration.  Initially thought the patient's symptoms were secondary to pyelonephritis, however her urinalysis is clear in our office today.  Due to patient's symptoms, length of duration, prolonged emesis with little p.o. intake in light of her cardiac risk factors I do believe that she needs to go to the emergency department for evaluation. I  also find it concerning that patient's blood pressure is normotensive today when her blood pressure is usually markedly elevated.Discussed with  patient that I believe that she needs labs, imaging of her abdomen, and probable IV fluids and medications. She defers transfer to emergency department by EMS and reports that her daughter is here and will drive her.  She reports that she will go to the emergency department now for evaluation.  Return if symptoms worsen or fail to improve. Aliene Beams, MD 11/23/2017

## 2017-11-23 NOTE — ED Provider Notes (Signed)
[facility] EMERGENCY DEPARTMENT Provider Note   [name]: [id number] Arrival date & time: 11/23/17  1244     History   Chief Complaint Chief Complaint  Patient presents with  . Abdominal Pain    HPI [name] is a 57 y.o. female.  HPI Pt comes in with cc of abdominal pain. Pt has hx of CHF, COPD, diabetes, diverticulitis, multiple abdominal surgeries including hysterectomy, cholecystectomy and bladder surgery.  Patient reports that she has been having abdominal pain intermittently for the last several weeks.  Over the past 3 days her pain has become constant, therefore she saw her primary care doctor who advised that patient come to the ER.  Patient reports that her pain is periumbilical in nature, sharp and intermittently radiating to the back.  Patient also has associated nausea with emesis.  Patient had emesis x4 yesterday.  Patient's bowel movements have been normal, appetite has been normal.    2 days ago patient started having burning with urination, without any blood in the urine.  Patient has no known history of AAA, she does smoke 1 pack a day since age 73.  Review of system is negative for any numbness or tingling, weakness in the legs.  Review of system is positive for back pain, however it is located in the right flank region.  Past Medical History:  Diagnosis Date  . Anxiety   . Asthma    Albuterol prn   . CHF (congestive heart failure) (HCC)    takes Furosemide daily  . Chronic back pain    DDD/scoliosis  . Complication of anesthesia    hard to wake up  . COPD (chronic obstructive pulmonary disease) (HCC)   . DDD (degenerative disc disease)   . Depression    takes Celexa,Clonazepam daily  . Diabetes mellitus    borderline  . Diverticulitis   . Edema of lower extremity   . Emphysema lung (HCC)   . Fibromyalgia   . Gastritis   . GERD (gastroesophageal reflux disease)    takes Pepcid daily  . Gout   . Gout of big toe    RIGHT  . Hemorrhoids   . Hiatal  hernia   . History of bronchitis    last time 4-77months ago   . History of colon polyps   . History of kidney stones    is in her left kidney and has been for couple of yrs and no problems  . HTN (hypertension)    takes lisinopril daily  . Hyperlipidemia    but doesn't take any meds  . Hypothyroidism    takes Synthroid daily  . Insomnia    doesn't take any meds for this  . Joint pain   . Joint swelling   . Migraine    has one now  . Migraine   . MS (multiple sclerosis) (HCC)    early stages  . Muscle spasm    takes Flexeril daily as needed  . Neuropathy   . Osteoporosis   . Phlebitis   . Pneumonia    hx of;last time in 2014  . PONV (postoperative nausea and vomiting)   . Shortness of breath    with exertion or stressed   . Sleep apnea    sleep study done at least 22yrs ago;has CPAP but doesn't use  . Stroke [facility])    left sided weakness  . TIA (transient ischemic attack)    Hx: of  . Urinary frequency     Patient  Active Problem List   Diagnosis Date Noted  . Fibromyalgia 11/02/2017  . Feeling of chest tightness 04/12/2015  . SOB (shortness of breath) 04/12/2015  . Hyperglycemia 04/08/2015  . Tobacco use disorder 04/08/2015  . Headache   . Left-sided weakness   . Hyperlipidemia 04/06/2015  . Migraine 04/05/2015  . Acute CVA (cerebrovascular accident) (HCC) 04/05/2015  . Acute blood loss anemia   . Hematochezia 11/15/2014  . Bilateral lower abdominal cramping 11/15/2014  . Rectal bleeding 11/15/2014  . Colitis, acute 11/15/2014  . Malnutrition of moderate degree (HCC) 04/17/2014  . Postoperative anemia due to acute blood loss 04/17/2014    Class: Acute  . Malnourished (HCC) 04/17/2014    Class: Present on Admission  . 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 Spondylolisthesis at L4-L5 level 04/14/2014  . Spondylosis of cervical spine 12/30/2013  . Cervical spondylosis without myelopathy 12/30/2013     Class: Chronic  . Spondylolisthesis of lumbar region 12/30/2013    Class: Chronic  . CAP (community acquired pneumonia) 08/24/2013  . GERD (gastroesophageal reflux disease) 08/24/2013  . COPD (chronic obstructive pulmonary disease) (HCC) 08/24/2013  . Chronic pain syndrome 08/24/2013  . Cigarette smoker 08/24/2013  . Chest pain 08/24/2013  . COPD exacerbation (HCC) 08/11/2012  . DM (diabetes mellitus) (HCC) 08/11/2012  . Leucocytosis 08/11/2012  . Hypertension 08/11/2012    Past Surgical History:  Procedure Laterality Date  . ABDOMINAL HYSTERECTOMY    . ANTERIOR CERVICAL DECOMP/DISCECTOMY FUSION N/A 12/30/2013   Procedure: ANTERIOR CERVICAL DISCECTOMY FUSION C4-5, C5-6, plate and screws, allograft, local bone graft;  Surgeon: Kerrin Champagne, MD;  Location: [facility] OR;  Service: Orthopedics;  Laterality: N/A;  . BACK SURGERY     lumbar discectomy  . BIOPSY N/A 01/20/2015   Procedure: COLON BIOPSY;  Surgeon: West Bali, MD;  Location: [facility] ORS;  Service: Endoscopy;  Laterality: N/A;  . BLADDER SUSPENSION    . CARDIAC CATHETERIZATION    . CARPAL TUNNEL RELEASE Right   . CHOLECYSTECTOMY    . COLONOSCOPY    . COLONOSCOPY WITH PROPOFOL N/A 01/20/2015   SLF: 1. Normal ileum 2. Redundant left colon 3. Smalll internal hemorrhoids.   . ESOPHAGOGASTRODUODENOSCOPY  10/29/07   normal  . fundic gland polyp     benign  . POLYPECTOMY N/A 01/20/2015   Procedure: RECTAL POLYPECTOMY;  Surgeon: West Bali, MD;  Location: [facility] ORS;  Service: Endoscopy;  Laterality: N/A;  . SIGMOIDOSCOPY  01/31/08   large internal hemorrhoids/small rectal polyp removed/rare sigmoid diverticula    OB History    Gravida Para Term Preterm AB Living   2 2 0 2   2   SAB TAB Ectopic Multiple Live Births                   Home Medications    Prior to Admission medications   Medication Sig Start Date End Date Taking? Authorizing Provider  albuterol (PROVENTIL) (5 MG/ML) 0.5% nebulizer solution Take 1 mL (5 mg total)  by nebulization every 6 (six) hours as needed for wheezing or shortness of breath. 08/26/17  Yes Idol, Raynelle Fanning, PA-C  amLODipine (NORVASC) 10 MG tablet Take 1 tablet (10 mg total) by mouth daily. 11/15/17  Yes Hagler, Fleet Contras, MD  Aspirin-Acetaminophen-Caffeine (GOODY HEADACHE PO) Take 1 Package by mouth 2 (two) times daily.   Yes [provider]  atorvastatin (LIPITOR) 20 MG tablet Take 1 tablet (20 mg total) by mouth daily.  11/02/17  Yes Hagler, Fleet Contras, MD  citalopram (CELEXA) 40 MG tablet Take 40 mg by mouth daily.   Yes [provider]  clonazePAM (KLONOPIN) 0.5 MG tablet Take 1 tablet (0.5 mg total) by mouth 3 (three) times daily as needed for anxiety. 11/02/17  Yes Aliene Beams, MD  cyclobenzaprine (FLEXERIL) 10 MG tablet Take 1 tablet (10 mg total) by mouth 3 (three) times daily as needed for muscle spasms. 11/02/17  Yes Aliene Beams, MD  famotidine (PEPCID) 20 MG tablet Take 2 tablets (40 mg total) by mouth daily. 04/08/15  Yes Black, Lesle Chris, NP  Fluticasone Furoate-Vilanterol (BREO ELLIPTA) 100-25 MCG/INH AEPB Inhale 1 puff into the lungs daily.   Yes [provider]  furosemide (LASIX) 20 MG tablet Take 0.5 tablets (10 mg total) by mouth daily. 11/15/17  Yes Hagler, Fleet Contras, MD  ibuprofen (ADVIL,MOTRIN) 800 MG tablet Take 800 mg by mouth 3 (three) times daily. 12/15/15  Yes [provider]  levothyroxine (SYNTHROID, LEVOTHROID) 150 MCG tablet Take 1 tablet (150 mcg total) daily by mouth. 09/15/17  Yes Hagler, Fleet Contras, MD  lisinopril (PRINIVIL,ZESTRIL) 40 MG tablet Take 1 tablet (40 mg total) by mouth daily. 11/15/17  Yes Hagler, Fleet Contras, MD  mirtazapine (REMERON) 15 MG tablet Take 15 mg by mouth at bedtime. 11/17/17  Yes [provider]  promethazine (PHENERGAN) 25 MG tablet Take 25 mg by mouth 3 (three) times daily. 12/15/15  Yes [provider]  SUBOXONE 8-2 MG FILM Place 0.5 Film under the tongue 3 (three) times daily. 12/29/15  Yes [provider]  tiotropium (SPIRIVA HANDIHALER) 18 MCG inhalation capsule Place 1 capsule (18 mcg total) daily into inhaler and inhale. 09/11/17  Yes Aliene Beams, MD  traZODone (DESYREL) 150 MG tablet Take 150 mg by mouth at bedtime.  12/15/15  Yes [provider]  azithromycin (ZITHROMAX) 250 MG tablet Take two pills a day for the first day and then one pill a day for the next four days Patient not taking: Reported on 11/23/2017 11/15/17   Aliene Beams, MD  benzonatate (TESSALON) 100 MG capsule Take 2 capsules (200 mg total) by mouth 3 (three) times daily as needed. Patient not taking: Reported on 11/23/2017 08/26/17   Burgess Amor, PA-C  ciprofloxacin (CIPRO) 500 MG tablet Take 1 tablet (500 mg total) by mouth every 12 (twelve) hours. 11/23/17   Derwood Kaplan, MD  ondansetron (ZOFRAN ODT) 4 MG disintegrating tablet 4mg  ODT q4 hours prn nausea/vomit 11/23/17   Derwood Kaplan, MD  predniSONE (DELTASONE) 20 MG tablet Take 3 pills a day for 3 days, take 2 pills a day for 3 days, take 1 pill a day for 3, take 1/2 pill each day for 3 days Patient not taking: Reported on 11/23/2017 11/15/17   Aliene Beams, MD    Family History Family History  Problem Relation Age of Onset  . Diabetes Mother   . Hypertension Mother   . Arthritis Father   . Diabetes Sister   . Neuropathy Sister   . Transient ischemic attack Sister   . Heart disease Sister   . Hypertension Sister   . Cancer - Lung Other   . Cancer - Prostate Other   . Cancer - Other Other   . Colon cancer Paternal Uncle   . Colon cancer Brother        deceased age 69 from colon CA and brain CA    Social History Social History   Tobacco Use  . Smoking status: Current  Some Day Smoker    Packs/day: 0.50    Years: 15.00    Pack years: 7.50    Types: Cigarettes  . Smokeless tobacco: Never Used  Substance Use Topics  . Alcohol use: No  . Drug use: No     Allergies   Demerol [meperidine]; Imitrex [sumatriptan]; Penicillins;  Sulfa antibiotics; Abilify [aripiprazole]; Levaquin [levofloxacin in d5w]; Neurontin [gabapentin]; Toradol [ketorolac tromethamine]; Tramadol; and Doxycycline   Review of Systems Review of Systems  Gastrointestinal: Positive for abdominal pain, nausea and vomiting.  Genitourinary: Positive for dysuria and flank pain.  Allergic/Immunologic: Negative for immunocompromised state.  Hematological: Does not bruise/bleed easily.  All other systems reviewed and are negative.    Physical Exam Updated Vital Signs BP (!) 99/51 (BP Location: Left Arm)   Pulse 74   Temp 98 F (36.7 C) (Oral)   Resp 18   Ht 5\' 6"  (1.676 m)   Wt 58.1 kg (128 lb)   SpO2 96%   BMI 20.66 kg/m   Physical Exam  Constitutional: She is oriented to person, place, and time. She appears well-developed.  HENT:  Head: Normocephalic and atraumatic.  Eyes: EOM are normal.  Neck: Normal range of motion. Neck supple.  Cardiovascular: Normal rate and intact distal pulses.  Pulmonary/Chest: Effort normal.  Abdominal: Bowel sounds are normal. She exhibits no pulsatile midline mass. There is tenderness in the epigastric area and periumbilical area.  Neurological: She is alert and oriented to person, place, and time.  Skin: Skin is warm and dry.  Nursing note and vitals reviewed.    ED Treatments / Results  Labs (all labs ordered are listed, but only abnormal results are displayed) Labs Reviewed  COMPREHENSIVE METABOLIC PANEL - Abnormal; Notable for the following components:      Result Value   Potassium 3.2 (*)    Glucose, Bld 101 (*)    AST 12 (*)    All other components within normal limits  URINALYSIS, ROUTINE W REFLEX MICROSCOPIC - Abnormal; Notable for the following components:   Color, Urine STRAW (*)    All other components within normal limits  LIPASE, BLOOD  CBC    EKG  EKG Interpretation None       Radiology Ct Abdomen Pelvis W Contrast  Result Date: 11/23/2017 CLINICAL DATA:  Abdominal  pain, nausea and vomiting for 1 day. History of nephrolithiasis, cholecystectomy and hysterectomy. EXAM: CT ABDOMEN AND PELVIS WITH CONTRAST TECHNIQUE: Multidetector CT imaging of the abdomen and pelvis was performed using the standard protocol following bolus administration of intravenous contrast. CONTRAST:  ISOVUE-300 IOPAMIDOL (ISOVUE-300) INJECTION 61% COMPARISON:  11/15/2014 CT abdomen/pelvis. FINDINGS: Lower chest: No significant pulmonary nodules or acute consolidative airspace disease. Hepatobiliary: Normal liver size. No liver mass. Cholecystectomy. Bile ducts are stable with mild diffuse intrahepatic biliary ductal dilatation and common bile duct diameter 12 mm. No radiopaque choledocholithiasis. Pancreas: Normal, with no mass or duct dilation. Spleen: Normal size. No mass. Adrenals/Urinary Tract: Normal adrenals. Nonobstructing 3 mm upper left renal stone. No hydronephrosis. Subcentimeter hypodense renal cortical lesion in the interpolar left kidney is not appreciably changed, considered benign. No new renal lesions. Collapsed bladder. Suggestion of stable chronic mild diffuse bladder wall thickening. Stomach/Bowel: Normal non-distended stomach. Normal caliber small bowel with no small bowel wall thickening. Normal appendix. Normal large bowel with no diverticulosis, large bowel wall thickening or pericolonic fat stranding. Vascular/Lymphatic: Atherosclerotic nonaneurysmal abdominal aorta. Patent portal, splenic, hepatic and renal veins. No pathologically enlarged lymph nodes in the abdomen or  pelvis. Reproductive: Status post hysterectomy, with no abnormal findings at the vaginal cuff. No adnexal mass. Other: No pneumoperitoneum, ascites or focal fluid collection. Musculoskeletal: No aggressive appearing focal osseous lesions. Status post bilateral posterior spinal fusion in the lower lumbar spine. Moderate lower lumbar spondylosis. IMPRESSION: 1. No acute abnormality. No evidence of bowel  obstruction or acute bowel inflammation. Normal appendix. 2. Stable chronic biliary ductal dilatation with CBD diameter 12 mm, compatible with post cholecystectomy state. 3. Nonobstructing left nephrolithiasis. 4. Suggestion of stable chronic mild diffuse bladder wall thickening, which may be due to underdistention. Correlation with urinalysis could be obtained as clinically warranted. 5.  Aortic Atherosclerosis (ICD10-I70.0). Electronically Signed   By: Delbert Phenix M.D.   On: 11/23/2017 19:20    Procedures Procedures (including critical care time)  Medications Ordered in ED Medications  metoCLOPramide (REGLAN) injection 10 mg (not administered)  diphenhydrAMINE (BENADRYL) injection 25 mg (not administered)  ciprofloxacin (CIPRO) tablet 500 mg (not administered)  ondansetron (ZOFRAN-ODT) disintegrating tablet 4 mg (4 mg Oral Given 11/23/17 1410)  pantoprazole (PROTONIX) injection 80 mg (80 mg Intravenous Given 11/23/17 1619)  ondansetron (ZOFRAN) injection 4 mg (4 mg Intravenous Given 11/23/17 1740)  acetaminophen (TYLENOL) tablet 1,000 mg (1,000 mg Oral Given 11/23/17 1719)  iopamidol (ISOVUE-300) 61 % injection 100 mL (100 mLs Intravenous Contrast Given 11/23/17 1835)     Initial Impression / Assessment and Plan / ED Course  I have reviewed the triage vital signs and the nursing notes.  Pertinent labs & imaging results that were available during my care of the patient were reviewed by me and considered in my medical decision making (see chart for details).  Clinical Course as of Nov 24 2051  Morey Hummingbird Nov 23, 2017  2053 CT scan results are reassuring. Given that patient is having dysuria, with flank pain and nausea we will start her on ciprofloxacin for pyelonephritis.  Patient has already passed oral challenge in the ER.  Results from the ER workup discussed with the patient face to face and all questions answered to the best of my ability.  Strict ER return precautions have been discussed,  and patient is agreeing with the plan and is comfortable with the workup done and the recommendations from the ER.  CT Abdomen Pelvis W Contrast [AN]    Clinical Course User Index [AN] Derwood Kaplan, MD    57 year old female comes in with chief complaint of abdominal pain.  Abdominal pain has been intermittently present for the last several days, it has become constant over the last 2 days.  Patient is also now having dysuria and right-sided flank pain.  Patient's past medical history significant for multiple abdominal surgeries, heavy smoking, CHF, diabetes.  Patient also has history of diverticulosis and colitis.  Differential diagnosis includes colitis, diverticulitis, pancreatitis, AAA, pyelonephritis, tumors.  Currently patient denies any lower extremity neurologic symptoms, no palpable mass on the abdominal exam, vascular exam is also normal with intact distal pulses.  CT scan has been ordered.  Final Clinical Impressions(s) / ED Diagnoses   Final diagnoses:  Pyelonephritis, acute  Generalized abdominal pain    ED Discharge Orders        Ordered    ciprofloxacin (CIPRO) 500 MG tablet  Every 12 hours     11/23/17 2051    ondansetron (ZOFRAN ODT) 4 MG disintegrating tablet     11/23/17 2051       Derwood Kaplan, MD 11/23/17 2054

## 2017-11-23 NOTE — ED Triage Notes (Signed)
Patient complains of lower abdominal pain. Pt states she was sent here by Dr. Alphonzo Grieve stating that she may have a CT scan for renal stone. Pt state nausea with vomiting x 3 times in last 24 hours.

## 2017-11-23 NOTE — ED Notes (Signed)
Pt taken to ct 

## 2017-11-23 NOTE — ED Notes (Signed)
Pt c/o nausea.  

## 2017-11-23 NOTE — ED Notes (Signed)
Pt alert & oriented x4, stable gait. Patient given discharge instructions, paperwork & prescription(s). Patient  instructed to stop at the registration desk to finish any additional paperwork. Patient verbalized understanding. Pt left department w/ no further questions. 

## 2017-11-28 ENCOUNTER — Ambulatory Visit: Payer: Self-pay | Admitting: Family Medicine

## 2017-11-28 MED FILL — clonazePAM 0.5 MG TABS: 0.5 | 30 days supply | Qty: 60 | Fill #0

## 2017-11-28 MED FILL — SUBOXONE 8 MG-2 MG SL FILM: 8-2 | 28 days supply | Qty: 84 | Fill #0

## 2017-11-29 ENCOUNTER — Encounter: Payer: Self-pay | Admitting: Family Medicine

## 2017-11-29 ENCOUNTER — Ambulatory Visit (INDEPENDENT_AMBULATORY_CARE_PROVIDER_SITE_OTHER): Payer: 59 | Admitting: Family Medicine

## 2017-11-29 ENCOUNTER — Other Ambulatory Visit: Payer: Self-pay

## 2017-11-29 VITALS — BP 140/66 | HR 60 | Temp 97.8°F | Resp 16 | Ht 66.0 in | Wt 129.8 lb

## 2017-11-29 DIAGNOSIS — E876 Hypokalemia: Secondary | ICD-10-CM | POA: Diagnosis not present

## 2017-11-29 DIAGNOSIS — J209 Acute bronchitis, unspecified: Secondary | ICD-10-CM | POA: Diagnosis not present

## 2017-11-29 DIAGNOSIS — J440 Chronic obstructive pulmonary disease with acute lower respiratory infection: Secondary | ICD-10-CM | POA: Diagnosis not present

## 2017-11-29 DIAGNOSIS — R0602 Shortness of breath: Secondary | ICD-10-CM | POA: Diagnosis not present

## 2017-11-29 MED ORDER — METHYLPREDNISOLONE ACETATE 80 MG/ML IJ SUSP: 80.0000 mg | Freq: Once | INTRAMUSCULAR | Status: DC

## 2017-11-29 MED ORDER — PREDNISONE 20 MG PO TABS
ORAL_TABLET | ORAL | 0 refills | Status: DC
Start: 2017-11-29 — End: 2018-02-05

## 2017-11-29 MED ORDER — FLUTICASONE FUROATE-VILANTEROL 200-25 MCG/INH IN AEPB: 1.0000 | INHALATION_SPRAY | Freq: Every day | RESPIRATORY_TRACT | 1 refills | Status: DC

## 2017-11-29 NOTE — Progress Notes (Signed)
Patient ID: [name], female    DOB: 12-19-1960, 57 y.o.   MRN: [id number]  Chief Complaint  Patient presents with  . Follow-up  . URI    Allergies Demerol [meperidine]; Imitrex [sumatriptan]; Penicillins; Sulfa antibiotics; Abilify [aripiprazole]; Levaquin [levofloxacin in d5w]; Neurontin [gabapentin]; Toradol [ketorolac tromethamine]; Tramadol; and Doxycycline  Subjective:   [name] is a 57 y.o. female who presents to [facility] today.  HPI [name] presents today for follow-up of her breathing issues.  She reports that she is somewhat better but is still having a great deal of coughing.  Reports that she gets winded with exertion and with coughing bouts.  She reports that she is still coughing up yellowish/brown sputum.  She denies any fevers.  She denies any chest pain.  She has completed the Zithromax and steroid taper that she was given in the office on November 15, 2017.  She does feel like her breathing is improved but she has not back to her baseline.  She reports that her cough bothers her significantly.  She reports she is using the Brio 100/25 1 puff a day.  She reports she is also using her Spiriva daily.  She reports that she is using her albuterol nebulizer twice a day at least.  She reports that her breathing was much better than this at baseline.  She is still continuing to smoke despite these respiratory symptoms.  She denies any nausea or vomiting.  She denies any abdominal pain.  She is eating and drinking well.  She is taking her blood pressure medication as directed.    Past Medical History:  Diagnosis Date  . Anxiety   . Asthma    Albuterol prn   . CHF (congestive heart failure) (HCC)    takes Furosemide daily  . Chronic back pain    DDD/scoliosis  . Complication of anesthesia    hard to wake up  . COPD (chronic obstructive pulmonary disease) (HCC)   . DDD (degenerative disc disease)   . Depression    takes Celexa,Clonazepam  daily  . Diabetes mellitus    borderline  . Diverticulitis   . Edema of lower extremity   . Emphysema lung (HCC)   . Fibromyalgia   . Gastritis   . GERD (gastroesophageal reflux disease)    takes Pepcid daily  . Gout   . Gout of big toe    RIGHT  . Hemorrhoids   . Hiatal hernia   . History of bronchitis    last time 4-53months ago   . History of colon polyps   . History of kidney stones    is in her left kidney and has been for couple of yrs and no problems  . HTN (hypertension)    takes lisinopril daily  . Hyperlipidemia    but doesn't take any meds  . Hypothyroidism    takes Synthroid daily  . Insomnia    doesn't take any meds for this  . Joint pain   . Joint swelling   . Migraine    has one now  . Migraine   . MS (multiple sclerosis) (HCC)    early stages  . Muscle spasm    takes Flexeril daily as needed  . Neuropathy   . Osteoporosis   . Phlebitis   . Pneumonia    hx of;last time in 2014  . PONV (postoperative nausea and vomiting)   . Shortness of breath    with exertion  or stressed   . Sleep apnea    sleep study done at least 76yrs ago;has CPAP but doesn't use  . Stroke [facility])    left sided weakness  . TIA (transient ischemic attack)    Hx: of  . Urinary frequency     Past Surgical History:  Procedure Laterality Date  . ABDOMINAL HYSTERECTOMY    . ANTERIOR CERVICAL DECOMP/DISCECTOMY FUSION N/A 12/30/2013   Procedure: ANTERIOR CERVICAL DISCECTOMY FUSION C4-5, C5-6, plate and screws, allograft, local bone graft;  Surgeon: Kerrin Champagne, MD;  Location: [facility] OR;  Service: Orthopedics;  Laterality: N/A;  . BACK SURGERY     lumbar discectomy  . BIOPSY N/A 01/20/2015   Procedure: COLON BIOPSY;  Surgeon: West Bali, MD;  Location: [facility] ORS;  Service: Endoscopy;  Laterality: N/A;  . BLADDER SUSPENSION    . CARDIAC CATHETERIZATION    . CARPAL TUNNEL RELEASE Right   . CHOLECYSTECTOMY    . COLONOSCOPY    . COLONOSCOPY WITH PROPOFOL N/A 01/20/2015   SLF: 1.  Normal ileum 2. Redundant left colon 3. Smalll internal hemorrhoids.   . ESOPHAGOGASTRODUODENOSCOPY  10/29/07   normal  . fundic gland polyp     benign  . POLYPECTOMY N/A 01/20/2015   Procedure: RECTAL POLYPECTOMY;  Surgeon: West Bali, MD;  Location: [facility]S;  Service: Endoscopy;  Laterality: N/A;  . SIGMOIDOSCOPY  01/31/08   large internal hemorrhoids/small rectal polyp removed/rare sigmoid diverticula    Family History  Problem Relation Age of Onset  . Diabetes Mother   . Hypertension Mother   . Arthritis Father   . Diabetes Sister   . Neuropathy Sister   . Transient ischemic attack Sister   . Heart disease Sister   . Hypertension Sister   . Cancer - Lung Other   . Cancer - Prostate Other   . Cancer - Other Other   . Colon cancer Paternal Uncle   . Colon cancer Brother        deceased age 82 from colon CA and brain CA     Social History   Socioeconomic History  . Marital status: Married    Spouse name: None  . Number of children: None  . Years of education: None  . Highest education level: None  Social Needs  . Financial resource strain: None  . Food insecurity - worry: None  . Food insecurity - inability: None  . Transportation needs - medical: None  . Transportation needs - non-medical: None  Occupational History  . None  Tobacco Use  . Smoking status: Current Some Day Smoker    Packs/day: 0.50    Years: 15.00    Pack years: 7.50    Types: Cigarettes  . Smokeless tobacco: Never Used  Substance and Sexual Activity  . Alcohol use: No  . Drug use: No  . Sexual activity: Yes    Birth control/protection: Surgical  Other Topics Concern  . None  Social History Narrative   Patient is married and has children.  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  Denies any alcohol or other drug use.  Reports that 1 of her children is also dealing with narcotic addiction and also goes to the Suboxone clinic with her.   Eats meat fruits and  vegetables.   Currently smokes.   Not interested in quitting smoking at this time.   Wears seatbelt.   Current Outpatient Medications on File Prior to Visit  Medication Sig  Dispense Refill  . albuterol (PROVENTIL) (5 MG/ML) 0.5% nebulizer solution Take 1 mL (5 mg total) by nebulization every 6 (six) hours as needed for wheezing or shortness of breath. 20 mL 1  . amLODipine (NORVASC) 10 MG tablet Take 1 tablet (10 mg total) by mouth daily. 90 tablet 3  . Aspirin-Acetaminophen-Caffeine (GOODY HEADACHE PO) Take 1 Package by mouth 2 (two) times daily.    Marland Kitchen atorvastatin (LIPITOR) 20 MG tablet Take 1 tablet (20 mg total) by mouth daily. 90 tablet 3  . citalopram (CELEXA) 40 MG tablet Take 40 mg by mouth daily.    . clonazePAM (KLONOPIN) 0.5 MG tablet Take 1 tablet (0.5 mg total) by mouth 3 (three) times daily as needed for anxiety. 30 tablet 0  . cyclobenzaprine (FLEXERIL) 10 MG tablet Take 1 tablet (10 mg total) by mouth 3 (three) times daily as needed for muscle spasms. 60 tablet 1  . famotidine (PEPCID) 20 MG tablet Take 2 tablets (40 mg total) by mouth daily.    . furosemide (LASIX) 20 MG tablet Take 0.5 tablets (10 mg total) by mouth daily. 45 tablet 1  . ibuprofen (ADVIL,MOTRIN) 800 MG tablet Take 800 mg by mouth 3 (three) times daily.  0  . levothyroxine (SYNTHROID, LEVOTHROID) 150 MCG tablet Take 1 tablet (150 mcg total) daily by mouth. 90 tablet 0  . lisinopril (PRINIVIL,ZESTRIL) 40 MG tablet Take 1 tablet (40 mg total) by mouth daily. 90 tablet 1  . mirtazapine (REMERON) 15 MG tablet Take 15 mg by mouth at bedtime.    . ondansetron (ZOFRAN ODT) 4 MG disintegrating tablet 4mg  ODT q4 hours prn nausea/vomit 12 tablet 0  . promethazine (PHENERGAN) 25 MG tablet Take 25 mg by mouth 3 (three) times daily.  0  . SUBOXONE 8-2 MG FILM Place 0.5 Film under the tongue 3 (three) times daily.    Marland Kitchen tiotropium (SPIRIVA HANDIHALER) 18 MCG inhalation capsule Place 1 capsule (18 mcg total) daily into inhaler  and inhale. 30 capsule 11  . traZODone (DESYREL) 150 MG tablet Take 150 mg by mouth at bedtime.   0  . benzonatate (TESSALON) 100 MG capsule Take 2 capsules (200 mg total) by mouth 3 (three) times daily as needed. (Patient not taking: Reported on 11/23/2017) 30 capsule 0   No current facility-administered medications on file prior to visit.      Review of Systems  Constitutional: Negative for appetite change, chills, fever and unexpected weight change.  HENT: Negative for trouble swallowing and voice change.   Respiratory: Positive for cough, shortness of breath and wheezing. Negative for apnea, choking and stridor.   Cardiovascular: Negative for chest pain, palpitations and leg swelling.  Gastrointestinal: Negative for abdominal distention, diarrhea, nausea and vomiting.  Genitourinary: Negative for decreased urine volume, difficulty urinating, dysuria and hematuria.     Objective:   BP 140/66 (BP Location: Left Arm, Patient Position: Sitting, Cuff Size: Normal)   Pulse 60   Temp 97.8 F (36.6 C) (Other (Comment))   Resp 16   Ht 5\' 6"  (1.676 m)   Wt 129 lb 12 oz (58.9 kg)   SpO2 95%   BMI 20.94 kg/m   Physical Exam  Constitutional: She is oriented to person, place, and time. She appears well-developed and well-nourished.  HENT:  Head: Normocephalic and atraumatic.  Eyes: EOM are normal. Pupils are equal, round, and reactive to light.  Neck: Normal range of motion. Neck supple.  Cardiovascular: Normal rate, regular rhythm and normal heart sounds.  Pulmonary/Chest: No accessory muscle usage or stridor. No bradypnea. No respiratory distress. She has no decreased breath sounds. She has wheezes in the right lower field and the left lower field. She has no rhonchi. She has no rales.  Neurological: She is alert and oriented to person, place, and time.  Skin: Skin is warm and dry.  Psychiatric: She has a normal mood and affect. Her behavior is normal. Thought content normal.  Vitals  reviewed.    Assessment and Plan  1. COPD (chronic obstructive pulmonary disease) with acute bronchitis (HCC) Discussed with patient that I believe that she has COPD exacerbation/severe COPD.  She is currently maxed out on Spiriva.  We will increase her dose of Brio inhaler at this time.  Patient has cough predominant symptoms with continued sputum production.  Her breathing has improved and she does look more comfortable than she was in the office a couple weeks ago.  Due to the severity of her symptoms will treat her with steroids again at this time.  We did discuss that her disease process is likely only to worsen especially since she continues to smoke cigarettes greater than 1 pack/day on a daily basis.  At this time will refer her to pulmonary for evaluation.  We did discuss worrisome signs and symptoms of breathing dysfunction and if those develop she is to go to the emergency department.  We will check chest x-ray today to make sure there is no other secondary cause her problem that we could be missing. - Ambulatory referral to Pulmonology - fluticasone furoate-vilanterol (BREO ELLIPTA) 200-25 MCG/INH AEPB; Inhale 1 puff into the lungs daily.  Dispense: 1 each; Refill: 1 - predniSONE (DELTASONE) 20 MG tablet; Take 3 pills a day for four days  Dispense: 12 tablet; Refill: 0 - methylPREDNISolone acetate (DEPO-MEDROL) injection 80 mg  2. SOB (shortness of breath)  - DG Chest 2 View; Future  3. Hypokalemia Emergency department records reviewed from last week when patient was evaluated there due to pyelonephritis.  Her potassium at that time was 3.2, most likely secondary to emesis.  We will check today to see if levels have stabilized and are back to normal.  She did not receive potassium supplementation in the emergency department. - Basic metabolic panel  No Follow-up on file. Aliene Beams, MD 11/29/2017

## 2017-11-30 ENCOUNTER — Ambulatory Visit: Payer: Self-pay | Admitting: Family Medicine

## 2017-12-07 ENCOUNTER — Telehealth: Payer: Self-pay | Admitting: *Deleted

## 2017-12-07 NOTE — Telephone Encounter (Signed)
Patient called stating she is still having whizzing, cough, and the tightness in her chest. Patient stated the medicine Dr Tracie Harrier prescribed has helped some but she is still having the problems. Please advise

## 2017-12-08 NOTE — Telephone Encounter (Signed)
Patient needs to follow-up in office or be seen at urgent care if she is still having symptoms.  Please advise.

## 2017-12-08 NOTE — Telephone Encounter (Signed)
Patient informed of message below, verbalized understanding.  

## 2017-12-14 ENCOUNTER — Ambulatory Visit (HOSPITAL_COMMUNITY)
Admission: RE | Admit: 2017-12-14 | Discharge: 2017-12-14 | Disposition: A | Discharge status: Home / Self Care | Payer: 59 | Source: Ambulatory Visit | Attending: Family Medicine | Admitting: Family Medicine

## 2017-12-14 ENCOUNTER — Other Ambulatory Visit: Payer: Self-pay | Admitting: Family Medicine

## 2017-12-14 DIAGNOSIS — R0602 Shortness of breath: Secondary | ICD-10-CM

## 2018-01-11 MED FILL — SUBOXONE 8 MG-2 MG SL FILM: 8-2 | 14 days supply | Qty: 42 | Fill #0

## 2018-01-25 MED FILL — SUBOXONE 8 MG-2 MG SL FILM: 8-2 | 28 days supply | Qty: 84 | Fill #0

## 2018-01-25 MED FILL — PROMETHAZINE 25 MG TABLET: 25 | 30 days supply | Qty: 90 | Fill #0

## 2018-02-01 ENCOUNTER — Other Ambulatory Visit: Payer: Self-pay

## 2018-02-01 ENCOUNTER — Emergency Department (HOSPITAL_COMMUNITY)
Admission: EM | Admit: 2018-02-01 | Discharge: 2018-02-01 | Disposition: A | Discharge status: Home / Self Care | Payer: 59 | Source: Home / Self Care

## 2018-02-01 ENCOUNTER — Emergency Department (HOSPITAL_COMMUNITY)
Admission: EM | Admit: 2018-02-01 | Discharge: 2018-02-01 | Disposition: A | Discharge status: Home / Self Care | Payer: 59 | Attending: Emergency Medicine | Admitting: Emergency Medicine

## 2018-02-01 ENCOUNTER — Encounter (HOSPITAL_COMMUNITY): Payer: Self-pay | Admitting: Emergency Medicine

## 2018-02-01 ENCOUNTER — Ambulatory Visit (INDEPENDENT_AMBULATORY_CARE_PROVIDER_SITE_OTHER): Payer: 59 | Admitting: Family Medicine

## 2018-02-01 ENCOUNTER — Encounter: Payer: Self-pay | Admitting: Family Medicine

## 2018-02-01 VITALS — BP 174/82 | HR 88 | Temp 97.3°F | Resp 16 | Ht 66.0 in | Wt 132.5 lb

## 2018-02-01 DIAGNOSIS — R1013 Epigastric pain: Secondary | ICD-10-CM | POA: Diagnosis not present

## 2018-02-01 DIAGNOSIS — F1911 Other psychoactive substance abuse, in remission: Secondary | ICD-10-CM

## 2018-02-01 DIAGNOSIS — R1084 Generalized abdominal pain: Secondary | ICD-10-CM | POA: Diagnosis not present

## 2018-02-01 DIAGNOSIS — Z87898 Personal history of other specified conditions: Secondary | ICD-10-CM

## 2018-02-01 DIAGNOSIS — R109 Unspecified abdominal pain: Secondary | ICD-10-CM

## 2018-02-01 DIAGNOSIS — F419 Anxiety disorder, unspecified: Secondary | ICD-10-CM | POA: Diagnosis not present

## 2018-02-01 DIAGNOSIS — Z5321 Procedure and treatment not carried out due to patient leaving prior to being seen by health care provider: Secondary | ICD-10-CM

## 2018-02-01 DIAGNOSIS — Z79899 Other long term (current) drug therapy: Secondary | ICD-10-CM

## 2018-02-01 LAB — I-STAT BETA HCG BLOOD, ED (MC, WL, AP ONLY): I-stat hCG, quantitative: 7.1 m[IU]/mL — ABNORMAL HIGH (ref <5)

## 2018-02-01 LAB — I-STAT TROPONIN, ED: Troponin i, poc: 0 ng/mL (ref 0.00–0.08)

## 2018-02-01 LAB — CBC
HEMATOCRIT: 37.2 % (ref 36.0–46.0)
HEMOGLOBIN: 11.8 g/dL — AB (ref 12.0–15.0)
MCH: 28.6 pg (ref 26.0–34.0)
MCHC: 31.7 g/dL (ref 30.0–36.0)
MCV: 90.3 fL (ref 78.0–100.0)
Platelets: 210 10*3/uL (ref 150–400)
RBC: 4.12 MIL/uL (ref 3.87–5.11)
RDW: 14.3 % (ref 11.5–15.5)
WBC: 4.6 10*3/uL (ref 4.0–10.5)

## 2018-02-01 LAB — HEPATIC FUNCTION PANEL
ALT: 24 U/L (ref 14–54)
AST: 20 U/L (ref 15–41)
Albumin: 3.7 g/dL (ref 3.5–5.0)
Alkaline Phosphatase: 64 U/L (ref 38–126)
Total Bilirubin: 0.4 mg/dL (ref 0.3–1.2)
Total Protein: 6.2 g/dL — ABNORMAL LOW (ref 6.5–8.1)

## 2018-02-01 LAB — BASIC METABOLIC PANEL
ANION GAP: 6 (ref 5–15)
BUN: 6 mg/dL (ref 6–20)
CALCIUM: 8.9 mg/dL (ref 8.9–10.3)
CHLORIDE: 105 mmol/L (ref 101–111)
CO2: 30 mmol/L (ref 22–32)
Creatinine, Ser: 0.69 mg/dL (ref 0.44–1.00)
GFR calc non Af Amer: >60 mL/min (ref >60)
Glucose, Bld: 99 mg/dL (ref 65–99)
POTASSIUM: 3.9 mmol/L (ref 3.5–5.1)
Sodium: 141 mmol/L (ref 135–145)

## 2018-02-01 LAB — LIPASE, BLOOD: LIPASE: 25 U/L (ref 11–51)

## 2018-02-01 NOTE — Patient Instructions (Signed)
Schedule mammo at check-out

## 2018-02-01 NOTE — Progress Notes (Signed)
Patient ID: [name], female    DOB: 1960/11/14, 57 y.o.   MRN: [id number]  Chief Complaint  Patient presents with  . Abdominal Pain  . Nausea  . Constipation    Allergies Demerol [meperidine]; Imitrex [sumatriptan]; Penicillins; Sulfa antibiotics; Abilify [aripiprazole]; Levaquin [levofloxacin in d5w]; Neurontin [gabapentin]; Toradol [ketorolac tromethamine]; Tramadol; and Doxycycline  Subjective:   [name] is a 57 y.o. female who presents to [facility] today.  HPI [name] presents today with a complaint of abdominal pain.  She reports that over the past 2 weeks she has been experiencing abdominal pain that is worse with eating.  She reports that she has pain today which is why she came into the office for evaluation.  She reports that her current abdominal pain is a 10 out of 10.  She reports the pain is like a meal has kicked her in the stomach.  She reports that the pain is present at all times but there are periods where it is worse.  She correlates the pain exacerbation to eating.  She reports that the pain is in her stomach area and around the area above her bellybutton.  She denies any vomiting.  She reports that she has been nauseated and been using Zofran.  She reports that she has not been able to eat well due to this pain.  She last ate something last night and this morning she has not eaten but only had a few sips of Dr. Reino Kent.  She denies any melena or bright red blood per rectum.  Denies hematochezia or hemoptysis.  Denies hematemesis.  She reports that she has been a little bit constipated but correlates this to a new medication that she was put on by the [facility] clinic. She reports that she has had heartburn and reflux in the past and this is completely different.  She reports this is much more severe in quality.  She denies any dysuria or urinary frequency.    Abdominal Pain  This is a new problem. The current episode started 1 to 4 weeks  ago. The onset quality is sudden. The problem occurs constantly. The problem has been gradually worsening. The pain is located in the epigastric region and periumbilical region. The pain is at a severity of 10/10. The pain is severe. The quality of the pain is aching, sharp and tearing. The abdominal pain does not radiate. Associated symptoms include anorexia, belching, constipation, nausea and weight loss. Pertinent negatives include no diarrhea, dysuria, fever, flatus, frequency, headaches, hematochezia, hematuria, melena or vomiting. The pain is aggravated by eating. The pain is relieved by nothing. She has tried antacids for the symptoms. The treatment provided mild relief. Her past medical history is significant for GERD. There is no history of colon cancer, Crohn's disease or pancreatitis.    Past Medical History:  Diagnosis Date  . Anxiety   . Asthma    Albuterol prn   . CHF (congestive heart failure) (HCC)    takes Furosemide daily  . Chronic back pain    DDD/scoliosis  . Complication of anesthesia    hard to wake up  . COPD (chronic obstructive pulmonary disease) (HCC)   . DDD (degenerative disc disease)   . Depression    takes Celexa,Clonazepam daily  . Diabetes mellitus    borderline  . Diverticulitis   . Edema of lower extremity   . Emphysema lung (HCC)   . Fibromyalgia   . Gastritis   .  GERD (gastroesophageal reflux disease)    takes Pepcid daily  . Gout   . Gout of big toe    RIGHT  . Hemorrhoids   . Hiatal hernia   . History of bronchitis    last time 4-72months ago   . History of colon polyps   . History of kidney stones    is in her left kidney and has been for couple of yrs and no problems  . HTN (hypertension)    takes lisinopril daily  . Hyperlipidemia    but doesn't take any meds  . Hypothyroidism    takes Synthroid daily  . Insomnia    doesn't take any meds for this  . Joint pain   . Joint swelling   . Migraine    has one now  . Migraine   . MS  (multiple sclerosis) (HCC)    early stages  . Muscle spasm    takes Flexeril daily as needed  . Neuropathy   . Osteoporosis   . Phlebitis   . Pneumonia    hx of;last time in 2014  . PONV (postoperative nausea and vomiting)   . Shortness of breath    with exertion or stressed   . Sleep apnea    sleep study done at least 69yrs ago;has CPAP but doesn't use  . Stroke [facility])    left sided weakness  . TIA (transient ischemic attack)    Hx: of  . Urinary frequency     Past Surgical History:  Procedure Laterality Date  . ABDOMINAL HYSTERECTOMY    . ANTERIOR CERVICAL DECOMP/DISCECTOMY FUSION N/A 12/30/2013   Procedure: ANTERIOR CERVICAL DISCECTOMY FUSION C4-5, C5-6, plate and screws, allograft, local bone graft;  Surgeon: Kerrin Champagne, MD;  Location: [facility] OR;  Service: Orthopedics;  Laterality: N/A;  . BACK SURGERY     lumbar discectomy  . BIOPSY N/A 01/20/2015   Procedure: COLON BIOPSY;  Surgeon: West Bali, MD;  Location: [facility] ORS;  Service: Endoscopy;  Laterality: N/A;  . BLADDER SUSPENSION    . CARDIAC CATHETERIZATION    . CARPAL TUNNEL RELEASE Right   . CHOLECYSTECTOMY    . COLONOSCOPY    . COLONOSCOPY WITH PROPOFOL N/A 01/20/2015   SLF: 1. Normal ileum 2. Redundant left colon 3. Smalll internal hemorrhoids.   . ESOPHAGOGASTRODUODENOSCOPY  10/29/07   normal  . fundic gland polyp     benign  . POLYPECTOMY N/A 01/20/2015   Procedure: RECTAL POLYPECTOMY;  Surgeon: West Bali, MD;  Location: [facility] ORS;  Service: Endoscopy;  Laterality: N/A;  . SIGMOIDOSCOPY  01/31/08   large internal hemorrhoids/small rectal polyp removed/rare sigmoid diverticula    Family History  Problem Relation Age of Onset  . Diabetes Mother   . Hypertension Mother   . Arthritis Father   . Diabetes Sister   . Neuropathy Sister   . Transient ischemic attack Sister   . Heart disease Sister   . Hypertension Sister   . Cancer - Lung Other   . Cancer - Prostate Other   . Cancer - Other Other   . Colon  cancer Paternal Uncle   . Colon cancer Brother        deceased age 58 from colon CA and brain CA     Social History   Socioeconomic History  . Marital status: Married    Spouse name: Not on file  . Number of children: Not on file  . Years of education: Not on file  .  Highest education level: Not on file  Occupational History  . Not on file  Social Needs  . Financial resource strain: Not on file  . Food insecurity:    Worry: Not on file    Inability: Not on file  . Transportation needs:    Medical: Not on file    Non-medical: Not on file  Tobacco Use  . Smoking status: Current Some Day Smoker    Packs/day: 0.50    Years: 15.00    Pack years: 7.50    Types: Cigarettes  . Smokeless tobacco: Never Used  Substance and Sexual Activity  . Alcohol use: No  . Drug use: No  . Sexual activity: Yes    Birth control/protection: Surgical  Lifestyle  . Physical activity:    Days per week: Not on file    Minutes per session: Not on file  . Stress: Not on file  Relationships  . Social connections:    Talks on phone: More than three times a week    Gets together: More than three times a week    Attends religious service: More than 4 times per year    Active member of club or organization: No    Attends meetings of clubs or organizations: Never    Relationship status: Widowed  Other Topics Concern  . Not on file  Social History Narrative   Patient is married and has children.  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  Denies any alcohol or other drug use.  Reports that 1 of her children is also dealing with narcotic addiction and also goes to the [facility] clinic with her.   Eats meat fruits and vegetables.   Currently smokes.   Not interested in quitting smoking at this time.   Wears seatbelt.   Current Outpatient Medications on File Prior to Visit  Medication Sig Dispense Refill  . albuterol (PROVENTIL) (5 MG/ML) 0.5% nebulizer solution Take 1 mL  (5 mg total) by nebulization every 6 (six) hours as needed for wheezing or shortness of breath. 20 mL 1  . amLODipine (NORVASC) 10 MG tablet Take 1 tablet (10 mg total) by mouth daily. 90 tablet 3  . Aspirin-Acetaminophen-Caffeine (GOODY HEADACHE PO) Take 1 Package by mouth 2 (two) times daily.    Marland Kitchen atorvastatin (LIPITOR) 20 MG tablet Take 1 tablet (20 mg total) by mouth daily. 90 tablet 3  . citalopram (CELEXA) 40 MG tablet Take 40 mg by mouth daily.    . clonazePAM (KLONOPIN) 0.5 MG tablet Take 1 tablet (0.5 mg total) by mouth 3 (three) times daily as needed for anxiety. 30 tablet 0  . cyclobenzaprine (FLEXERIL) 10 MG tablet Take 1 tablet (10 mg total) by mouth 3 (three) times daily as needed for muscle spasms. 60 tablet 1  . famotidine (PEPCID) 20 MG tablet Take 2 tablets (40 mg total) by mouth daily.    . fluticasone furoate-vilanterol (BREO ELLIPTA) 200-25 MCG/INH AEPB Inhale 1 puff into the lungs daily. 1 each 1  . furosemide (LASIX) 20 MG tablet Take 0.5 tablets (10 mg total) by mouth daily. 45 tablet 1  . ibuprofen (ADVIL,MOTRIN) 800 MG tablet Take 800 mg by mouth 3 (three) times daily.  0  . levothyroxine (SYNTHROID, LEVOTHROID) 150 MCG tablet TAKE 1 TABLET BY MOUTH ONCE DAILY 90 tablet 0  . lisinopril (PRINIVIL,ZESTRIL) 40 MG tablet Take 1 tablet (40 mg total) by mouth daily. 90 tablet 1  . mirtazapine (REMERON) 15 MG tablet  Take 15 mg by mouth at bedtime.    . naloxegol oxalate (MOVANTIK) 12.5 MG TABS tablet Take by mouth daily.    . promethazine (PHENERGAN) 25 MG tablet Take 25 mg by mouth 3 (three) times daily.  0  . SUBOXONE 8-2 MG FILM Place 0.5 Film under the tongue 3 (three) times daily.    Marland Kitchen tiotropium (SPIRIVA HANDIHALER) 18 MCG inhalation capsule Place 1 capsule (18 mcg total) daily into inhaler and inhale. 30 capsule 11  . traZODone (DESYREL) 150 MG tablet Take 150 mg by mouth at bedtime.   0  . benzonatate (TESSALON) 100 MG capsule Take 2 capsules (200 mg total) by mouth 3  (three) times daily as needed. (Patient not taking: Reported on 11/23/2017) 30 capsule 0  . ondansetron (ZOFRAN ODT) 4 MG disintegrating tablet 4mg  ODT q4 hours prn nausea/vomit (Patient not taking: Reported on 02/01/2018) 12 tablet 0  . predniSONE (DELTASONE) 20 MG tablet Take 3 pills a day for four days (Patient not taking: Reported on 02/01/2018) 12 tablet 0   Current Facility-Administered Medications on File Prior to Visit  Medication Dose Route Frequency Provider Last Rate Last Dose  . methylPREDNISolone acetate (DEPO-MEDROL) injection 80 mg  80 mg Intramuscular Once Aliene Beams, MD        Review of Systems  Constitutional: Positive for appetite change, fatigue and weight loss. Negative for activity change, chills, fever and unexpected weight change.  HENT: Negative for trouble swallowing and voice change.   Respiratory: Negative for cough, chest tightness and wheezing.   Cardiovascular: Negative for chest pain, palpitations and leg swelling.  Gastrointestinal: Positive for abdominal distention, abdominal pain, anorexia, constipation and nausea. Negative for blood in stool, diarrhea, flatus, hematochezia, melena and vomiting.  Genitourinary: Negative for dysuria, frequency and hematuria.  Neurological: Negative for tremors, facial asymmetry, weakness, numbness and headaches.     Objective:   BP (!) 174/82 (BP Location: Left Arm, Patient Position: Sitting, Cuff Size: Normal)   Pulse 88   Temp (!) 97.3 F (36.3 C) (Temporal)   Resp 16   Ht 5\' 6"  (1.676 m)   Wt 132 lb 8 oz (60.1 kg)   SpO2 94%   BMI 21.39 kg/m   Physical Exam  Constitutional: She is oriented to person, place, and time. She appears well-developed and well-nourished.  HENT:  Head: Normocephalic and atraumatic.  Eyes: Pupils are equal, round, and reactive to light. EOM are normal.  Cardiovascular: Normal rate, regular rhythm and normal heart sounds.  Pulmonary/Chest: Effort normal and breath sounds normal. She  has no wheezes.  Abdominal: Soft. Normal appearance and bowel sounds are normal. She exhibits no distension and no ascites. There is tenderness in the epigastric area, periumbilical area and left upper quadrant. There is rebound and guarding. There is no rigidity and no CVA tenderness.  Neurological: She is alert and oriented to person, place, and time.  Skin: Skin is warm and dry. Capillary refill takes less than 2 seconds.     Assessment and Plan  1. Generalized abdominal pain Patient presenting with generalized abdominal pain but worse in the epigastric and periumbilical region.  Symptoms worsening over the past couple weeks with associated nausea, decreased appetite, and decreased oral intake.  Patient experienced much discomfort and expressed significant pain with examination in office today.  Examination did reveal evidence of rebound and guarding.  Reviewed case with Dr. Delton See who also examined patient and had the same findings.  Discussing with patient regarding her symptoms and need  for evaluation.  Discussed with patient that due to the severity of her findings and her history that she is giving in the office today that it would be recommended that she go to the emergency department for imaging studies and laboratory evaluation.  We did discuss possible etiologies of the cause of this abdominal pain but explained to patient that without further evaluation that I would not be able to give her any specific information.  We did discuss that her pain is not characteristic of reflux.  Patient deferred transfer to emergency department by EMS and wish to travel by private vehicle.  Emergency department was called by nursing staff to alert them that she was coming as a referral from our office for abdominal pain.  Prior to completion of seeing patients for the morning I was told by our front desk staff that patient was at the window with multiple other family members asking to speak with me.  I  reviewed in the chart and patient did go to the emergency department however it was noted that she did not feel like waiting and was going to follow-up with her gastroenterologist.  Upon presentation at our front desk she spoke with our office manager and told her that she was told she could not be seen by physician at the emergency department, that all they were going to do was blood work and that she wanted to have it done here, and that the emergency department told her they would not be performing any scans on her abdomen today.  I called and spoke with the triage nurse in the emergency department who it assisted the patient.  She reports that patient was #3 in line to be seen.  Due to her symptoms they had requested that she go ahead and get blood work done prior to evaluation with the emergency department physician.  She did tell patient that they do not order any scans until they receive the lab work back to make sure that everything is in line with imaging.  She reports that she told the patient she would most likely receive a CT scan of her abdomen today to evaluate the pain.  She reports that patient told her that she did not want to wait and that she was going to follow-up with her gastroenterologist.  Our office manager spoke with the family.  She conveyed to them that it was my recommendation and still is my recommendation that she be seen and evaluated in the emergency department.  She conveyed to the family the importance of following medical advice.  The family felt that the patient needed to be seen and evaluated at [facility] in [place] and reportedly they were going to take her there for evaluation.  2. Anxiety 3. Chronically on benzodiazepine therapy 4. History of substance abuse   In the office today after examination of her abdomen in the exam room patient made a request for prescription for Xanax.  I reminded patient that we have had this conversation on multiple occasions that  her benzodiazepines are to be prescribed by her psychiatrist.  She is told me in the past that her psychiatrist wanted me to prescribe the medication and requested that I give her this medication.  She had signed a waiver for me to get the records from the psychiatrist.  I did get the records and reviewed the records.  There was no indication of her psychiatrist requesting for me to refill or give her benzodiazepines.  I have  explained this to this patient on multiple occasions.  She tells me that her psychiatrist will not give them to her.  She tells me that the [facility] clinic has given her some Xanax but she would like for me to at least take please give her a few Xanax a day to help her get through.  She reports that her nerves are shot and she needs the Xanax.  She is followed by a psychiatrist.  She was encouraged to keep her visits with psychiatry.  She has the numbers of how to seek care from mental health services if her mood changed or worsened.  I explained to patient in detail today that I would not give her Xanax at this visit nor on subsequent visits.  I told her that I did not agree with Xanax for management of chronic anxiety nor do I think it is a good idea for patients with a history of substance abuse to be on habit-forming medications.  She voiced understanding but still asked me to give her some Xanax just this 1 time.  Patient did request for me to send in a prescription for her muscle relaxer.  We momentarily discussed her blood pressure today.  We discussed that it was elevated.  She feels like her blood pressure is elevated secondary to pain.  However, I do believe that she has uncontrolled hypertension.    Patient has been noncompliant with recommended medical therapy.  We have had multiple conversations regarding benzodiazepines use and my refusal to comply with her request.  She has not been truthful about today's encounter at the emergency department.  At this time,  patient will be  discharged from this practice due to dishonesty, not following recommended course of treatment, and I do believe that this does not promote a strong physician/patient relationship. No follow-ups on file. Aliene Beams, MD 02/01/2018

## 2018-02-01 NOTE — ED Notes (Addendum)
No answer x3 when called for room 

## 2018-02-01 NOTE — ED Triage Notes (Signed)
Pt reports n/v with abd pain for several months now, worsening over the past 2 weeks.  Sent for eval by Dr. Tracie Harrier.

## 2018-02-01 NOTE — ED Triage Notes (Addendum)
Pt has epigastric pain for over 1 month and has gotten worse over the last week. Pt states pain is worse with eating. Pt has been having some shortness of breath as well.

## 2018-02-01 NOTE — ED Triage Notes (Signed)
Pt decided to follow up with GI and not wait to be seen in ED.

## 2018-02-02 ENCOUNTER — Encounter: Payer: Self-pay | Admitting: Family Medicine

## 2018-02-05 ENCOUNTER — Other Ambulatory Visit: Payer: Self-pay | Admitting: *Deleted

## 2018-02-05 ENCOUNTER — Ambulatory Visit (INDEPENDENT_AMBULATORY_CARE_PROVIDER_SITE_OTHER): Payer: 59 | Admitting: Nurse Practitioner

## 2018-02-05 ENCOUNTER — Encounter: Payer: Self-pay | Admitting: Nurse Practitioner

## 2018-02-05 ENCOUNTER — Telehealth: Payer: Self-pay | Admitting: *Deleted

## 2018-02-05 DIAGNOSIS — G8929 Other chronic pain: Secondary | ICD-10-CM

## 2018-02-05 DIAGNOSIS — R1013 Epigastric pain: Secondary | ICD-10-CM | POA: Diagnosis not present

## 2018-02-05 DIAGNOSIS — R110 Nausea: Secondary | ICD-10-CM | POA: Diagnosis not present

## 2018-02-05 DIAGNOSIS — R112 Nausea with vomiting, unspecified: Secondary | ICD-10-CM | POA: Insufficient documentation

## 2018-02-05 MED ORDER — PANTOPRAZOLE SODIUM 40 MG PO TBEC: 40.0000 mg | DELAYED_RELEASE_TABLET | Freq: Two times a day (BID) | ORAL | 3 refills | Status: DC

## 2018-02-05 NOTE — Assessment & Plan Note (Signed)
The patient notes chronic, significant, regular nausea.  No vomiting because "it is difficult for me to throw up."  I feel her symptoms are likely related to GERD with possible esophagitis, gastritis, duodenitis, peptic ulcer disease, duodenal ulcer given her significant NSAID intake.  I recommended she avoid NSAIDs as much as possible.  However, given her chronic pain and history of opioid addiction this may be difficult.  I have started her on Protonix twice daily.  We will plan for EGD as per below.  Follow-up in 3 months.

## 2018-02-05 NOTE — Addendum Note (Signed)
Addended by: Delane Ginger, June Rode A on: 02/05/2018 11:57 AM   Modules accepted: Orders

## 2018-02-05 NOTE — Patient Instructions (Signed)
1. I have sent in Protonix 40 mg to your pharmacy.  Take this twice a day, on an empty stomach, 30 minutes before meal. 2. We will schedule your procedure for you. 3. Further recommendations will be made based on the results of your procedure. 4. Follow-up in 3 months. 5. Call us if you have any questions or concerns   It was great to see you today!  Have a wonderful Spring and start of Summer!!    At [facility] we value your feedback. You may receive a survey about your visit today. Please share your experience as we strive to create trusing relationships with our patients to provide genuine, compassionate, quality care.

## 2018-02-05 NOTE — Progress Notes (Signed)
cc'd to pcp 

## 2018-02-05 NOTE — Progress Notes (Signed)
Primary Care Physician:  Aliene Beams, MD Primary Gastroenterologist:  Dr. Darrick Penna  Chief Complaint  Patient presents with  . Abdominal Pain    upper area, constant dull pain x few months  . Nausea    no vomiting    HPI:   [name] is a 57 y.o. female who presents for abdominal pain.  The patient was last seen in our office 01/08/2015 for rectal bleeding and acute colitis.  Hospital admission 11/15/2014 for colitis and rectal bleeding, hemoglobin mildly decreased but stable throughout hospitalization.  CT with colitis including etiologies infectious versus ischemic.  Stool path panel negative.  Last flexible sigmoidoscopy 2009 recommended 5-year repeat.  She indicated rectal bleeding 3 weeks prior to her last visit as well as abdominal pain sharp, crampy, constant.  Also diarrhea with eating.  45 pound weight loss in 2 months.  TSH very low and medication was adjusted related to this.  Bentyl had not helped.  Objective weight data and consistent with subjective reports which indicates patient weight of 104 pounds in January 2016, February noted 125 pounds, today noted 119 pounds.  She was requesting pain medication for abdominal pain at her last visit.  Recommended CBC, colonoscopy.  If colonoscopy normal can consider further imaging.  Follow-up in 6-8 weeks.  Colonoscopy completed 01/20/2015 which found normal ileum, redundant left colon, small internal hemorrhoids, large external hemorrhoids.  Recommended high-fiber diet, dicyclomine 20 mg 30 minutes prior to meals and at bedtime.  Follow-up in 4 months.  Next colonoscopy in 10 years.  The patient was a no-show for her follow-up visit.  Today she states she's doing ok overall. Has a history of haital hernia. Having epigastric pain 20-30 mins postprandial; also if she hasn't eaten, or if she's stressed. Also with nausea, occasionally with pain but sometimes not. No vomiting (states "it's difficult for me to vomit."). Takes Pepcid  intermittently which sometimes helps. Last EGD 2008 with gastritis and gastric polyps. Denies any other abdominal pain. Denies hematochezia, melena. States she's lost about "50-some pounds" when she was hooked on pain pills and started on Suboxone. Has chronic poor appetite. Objectively her weight is up 13 lbs in the past 3 years. Has shortness of breath with exertion and hasn't been seen for this in "some time." Denies chest pain, lightheadedness, syncope, near syncope. Denies any other upper or lower GI symptoms.  Takes Ibuprofen 800 mg tid. Occasional Goody powder. Daily 81 mg ASA daily. No other NSAIDs/ASA powders.  She was seen in the ER for abdominal pain 02/01/18 but left after waiting "because my stomach was hurting so bad." She went to [facility] ER, and did similar. Labs reviewed from ED 02/01/18: Midly low hgb 11.8; Lipase and trop normal. LFTs normal. EKG appears essentially normal.  Noted hgb mildly low in the 11-range since around 2016.  Past Medical History:  Diagnosis Date  . Anxiety   . Asthma    Albuterol prn   . CHF (congestive heart failure) (HCC)    takes Furosemide daily  . Chronic back pain    DDD/scoliosis  . Complication of anesthesia    hard to wake up  . COPD (chronic obstructive pulmonary disease) (HCC)   . DDD (degenerative disc disease)   . Depression    takes Celexa,Clonazepam daily  . Diabetes mellitus    borderline  . Diverticulitis   . Edema of lower extremity   . Emphysema lung (HCC)   . Fibromyalgia   . Gastritis   .  GERD (gastroesophageal reflux disease)    takes Pepcid daily  . Gout   . Gout of big toe    RIGHT  . Hemorrhoids   . Hiatal hernia   . History of bronchitis    last time 4-13months ago   . History of colon polyps   . History of kidney stones    is in her left kidney and has been for couple of yrs and no problems  . HTN (hypertension)    takes lisinopril daily  . Hyperlipidemia    but doesn't take any meds  . Hypothyroidism     takes Synthroid daily  . Insomnia    doesn't take any meds for this  . Joint pain   . Joint swelling   . Migraine    has one now  . Migraine   . MS (multiple sclerosis) (HCC)    early stages  . Muscle spasm    takes Flexeril daily as needed  . Neuropathy   . Osteoporosis   . Phlebitis   . Pneumonia    hx of;last time in 2014  . PONV (postoperative nausea and vomiting)   . Shortness of breath    with exertion or stressed   . Sleep apnea    sleep study done at least 49yrs ago;has CPAP but doesn't use  . Stroke [facility])    left sided weakness  . TIA (transient ischemic attack)    Hx: of  . Urinary frequency     Past Surgical History:  Procedure Laterality Date  . ABDOMINAL HYSTERECTOMY    . ANTERIOR CERVICAL DECOMP/DISCECTOMY FUSION N/A 12/30/2013   Procedure: ANTERIOR CERVICAL DISCECTOMY FUSION C4-5, C5-6, plate and screws, allograft, local bone graft;  Surgeon: Kerrin Champagne, MD;  Location: [facility] OR;  Service: Orthopedics;  Laterality: N/A;  . BACK SURGERY     lumbar discectomy  . BIOPSY N/A 01/20/2015   Procedure: COLON BIOPSY;  Surgeon: West Bali, MD;  Location: [facility] ORS;  Service: Endoscopy;  Laterality: N/A;  . BLADDER SUSPENSION    . CARDIAC CATHETERIZATION    . CARPAL TUNNEL RELEASE Right   . CHOLECYSTECTOMY    . COLONOSCOPY    . COLONOSCOPY WITH PROPOFOL N/A 01/20/2015   [facility]: 1. Normal ileum 2. Redundant left colon 3. Smalll internal hemorrhoids.   . ESOPHAGOGASTRODUODENOSCOPY  10/29/07   normal  . fundic gland polyp     benign  . POLYPECTOMY N/A 01/20/2015   Procedure: RECTAL POLYPECTOMY;  Surgeon: West Bali, MD;  Location: [facility] ORS;  Service: Endoscopy;  Laterality: N/A;  . SIGMOIDOSCOPY  01/31/08   large internal hemorrhoids/small rectal polyp removed/rare sigmoid diverticula    Current Outpatient Medications  Medication Sig Dispense Refill  . albuterol (PROVENTIL) (5 MG/ML) 0.5% nebulizer solution Take 1 mL (5 mg total) by nebulization every 6 (six) hours  as needed for wheezing or shortness of breath. 20 mL 1  . amLODipine (NORVASC) 10 MG tablet Take 1 tablet (10 mg total) by mouth daily. 90 tablet 3  . Aspirin-Acetaminophen-Caffeine (GOODY HEADACHE PO) Take 1 Package by mouth 2 (two) times daily.    . citalopram (CELEXA) 40 MG tablet Take 40 mg by mouth daily.    . clonazePAM (KLONOPIN) 0.5 MG tablet Take 1 tablet (0.5 mg total) by mouth 3 (three) times daily as needed for anxiety. 30 tablet 0  . cyclobenzaprine (FLEXERIL) 10 MG tablet Take 1 tablet (10 mg total) by mouth 3 (three) times daily as needed for muscle  spasms. 60 tablet 1  . famotidine (PEPCID) 20 MG tablet Take 2 tablets (40 mg total) by mouth daily.    . fluticasone furoate-vilanterol (BREO ELLIPTA) 200-25 MCG/INH AEPB Inhale 1 puff into the lungs daily. 1 each 1  . furosemide (LASIX) 20 MG tablet Take 0.5 tablets (10 mg total) by mouth daily. (Patient taking differently: Take 10 mg by mouth as needed. ) 45 tablet 1  . ibuprofen (ADVIL,MOTRIN) 800 MG tablet Take 800 mg by mouth 3 (three) times daily.  0  . levothyroxine (SYNTHROID, LEVOTHROID) 150 MCG tablet TAKE 1 TABLET BY MOUTH ONCE DAILY 90 tablet 0  . lisinopril (PRINIVIL,ZESTRIL) 40 MG tablet Take 1 tablet (40 mg total) by mouth daily. 90 tablet 1  . naloxegol oxalate (MOVANTIK) 12.5 MG TABS tablet Take 12.5 mg by mouth daily.     . promethazine (PHENERGAN) 25 MG tablet Take 25 mg by mouth 3 (three) times daily.  0  . SUBOXONE 8-2 MG FILM Place 0.5 Film under the tongue 3 (three) times daily.    Marland Kitchen tiotropium (SPIRIVA HANDIHALER) 18 MCG inhalation capsule Place 1 capsule (18 mcg total) daily into inhaler and inhale. 30 capsule 11  . traZODone (DESYREL) 150 MG tablet Take 150 mg by mouth at bedtime.   0   Current Facility-Administered Medications  Medication Dose Route Frequency Provider Last Rate Last Dose  . methylPREDNISolone acetate (DEPO-MEDROL) injection 80 mg  80 mg Intramuscular Once Aliene Beams, MD         Allergies as of 02/05/2018 - Review Complete 02/05/2018  Allergen Reaction Noted  . Demerol [meperidine] Anaphylaxis and Swelling 08/06/2012  . Imitrex [sumatriptan] Anaphylaxis 04/05/2015  . Penicillins Anaphylaxis and Swelling 08/06/2012  . Sulfa antibiotics Anaphylaxis 08/06/2012  . Abilify [aripiprazole] Nausea And Vomiting 11/23/2017  . Levaquin [levofloxacin in d5w]  04/10/2014  . Neurontin [gabapentin] Other (See Comments) 11/04/2013  . Toradol [ketorolac tromethamine] Other (See Comments) 11/15/2014  . Tramadol Other (See Comments) 01/08/2015  . Doxycycline Nausea And Vomiting 09/21/2012    Family History  Problem Relation Age of Onset  . Diabetes Mother   . Hypertension Mother   . Arthritis Father   . Diabetes Sister   . Neuropathy Sister   . Transient ischemic attack Sister   . Heart disease Sister   . Hypertension Sister   . Cancer - Lung Other   . Cancer - Prostate Other   . Cancer - Other Other   . Colon cancer Paternal Uncle   . Colon cancer Brother        deceased age 82 from colon CA and brain CA    Social History   Socioeconomic History  . Marital status: Married    Spouse name: Not on file  . Number of children: Not on file  . Years of education: Not on file  . Highest education level: Not on file  Occupational History  . Not on file  Social Needs  . Financial resource strain: Not on file  . Food insecurity:    Worry: Not on file    Inability: Not on file  . Transportation needs:    Medical: Not on file    Non-medical: Not on file  Tobacco Use  . Smoking status: Current Some Day Smoker    Packs/day: 0.75    Years: 15.00    Pack years: 11.25    Types: Cigarettes  . Smokeless tobacco: Never Used  Substance and Sexual Activity  . Alcohol use: No  . Drug  use: No  . Sexual activity: Yes    Birth control/protection: Surgical  Lifestyle  . Physical activity:    Days per week: Not on file    Minutes per session: Not on file  . Stress:  Not on file  Relationships  . Social connections:    Talks on phone: More than three times a week    Gets together: More than three times a week    Attends religious service: More than 4 times per year    Active member of club or organization: No    Attends meetings of clubs or organizations: Never    Relationship status: Widowed  . Intimate partner violence:    Fear of current or ex partner: No    Emotionally abused: No    Physically abused: No    Forced sexual activity: No  Other Topics Concern  . Not on file  Social History Narrative   Patient is married and has children.  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  Denies any alcohol or other drug use.  Reports that 1 of her children is also dealing with narcotic addiction and also goes to the Suboxone clinic with her.   Eats meat fruits and vegetables.   Currently smokes.   Not interested in quitting smoking at this time.   Wears seatbelt.    Review of Systems: Complete ROS negative except as per HPI.    Physical Exam: BP (!) 151/71   Pulse 86   Temp (!) 96.5 F (35.8 C) (Oral)   Ht 5\' 4"  (1.626 m)   Wt 132 lb 9.6 oz (60.1 kg)   BMI 22.76 kg/m  General:   Alert and oriented. Pleasant and cooperative. Well-nourished and well-developed.  Eyes:  Without icterus, sclera clear and conjunctiva pink.  Ears:  Normal auditory acuity. Cardiovascular:  S1, S2 present without murmurs appreciated. Extremities without clubbing or edema. Respiratory:  Clear to auscultation bilaterally. No wheezes, rales, or rhonchi. No distress.  Gastrointestinal:  +BS, soft, and non-distended. Significant epigastric TTP noted. No HSM noted. No guarding or rebound. No masses appreciated.  Rectal:  Deferred  Musculoskalatal:  Symmetrical without gross deformities. Neurologic:  Alert and oriented x4;  grossly normal neurologically. Psych:  Alert and cooperative. Normal mood and affect. Heme/Lymph/Immune: No excessive  bruising noted.    02/05/2018 11:54 AM   Disclaimer: This note was dictated with voice recognition software. Similar sounding words can inadvertently be transcribed and may not be corrected upon review.

## 2018-02-05 NOTE — Assessment & Plan Note (Signed)
The patient has acute on chronic epigastric pain.  She does have a history of GERD.  Takes Pepcid intermittently.  Her pain is worse, noted significant pain on palpation during exam.  She is at high risk for peptic ulcer disease and duodenal ulcer given ibuprofen 800 mg 3 times daily, daily aspirin, occasional Goody powder.  She is not on a PPI currently.  I will start her on Protonix 40 mg twice daily.  We will set her up with an upper endoscopy given persistent, significant epigastric pain and daily nausea.  She has not vomited, but states it is difficult for her to vomit.  I will have her follow-up in 3 months.  Proceed with EGD on propofol/MAC with Dr. Oneida Alar in near future: the risks, benefits, and alternatives have been discussed with the patient in detail. The patient states understanding and desires to proceed.  The patient is currently on Celexa, Klonopin, Flexeril, Phenergan.  No other anticoagulants, anxiolytics, chronic pain medications, or antidepressants.  We will plan for the procedure on propofol/MAC to promote adequate sedation.

## 2018-02-05 NOTE — Telephone Encounter (Signed)
Patient aware pre-op scheduled for 04/03/18 at 10:00am. Letter mailed.

## 2018-02-05 NOTE — Telephone Encounter (Signed)
Patient scheduled for EGD with [facility] w/ MAC on 04/10/18 at 9:30am. Instructions mailed to patient.   PA for EGD was approved via [facility] website. Auth # [id number].

## 2018-02-16 ENCOUNTER — Other Ambulatory Visit: Payer: Self-pay

## 2018-02-16 ENCOUNTER — Telehealth: Payer: Self-pay | Admitting: Family Medicine

## 2018-02-16 DIAGNOSIS — J209 Acute bronchitis, unspecified: Secondary | ICD-10-CM

## 2018-02-16 DIAGNOSIS — I10 Essential (primary) hypertension: Secondary | ICD-10-CM

## 2018-02-16 DIAGNOSIS — M797 Fibromyalgia: Secondary | ICD-10-CM

## 2018-02-16 DIAGNOSIS — J440 Chronic obstructive pulmonary disease with acute lower respiratory infection: Secondary | ICD-10-CM

## 2018-02-16 MED ORDER — FLUTICASONE FUROATE-VILANTEROL 200-25 MCG/INH IN AEPB: 1.0000 | INHALATION_SPRAY | Freq: Every day | RESPIRATORY_TRACT | 1 refills | Status: DC

## 2018-02-16 MED ORDER — LISINOPRIL 40 MG PO TABS: 40.0000 mg | ORAL_TABLET | Freq: Every day | ORAL | 1 refills | Status: DC

## 2018-02-16 MED ORDER — LEVOTHYROXINE SODIUM 150 MCG PO TABS: 150.0000 ug | ORAL_TABLET | Freq: Every day | ORAL | 0 refills | Status: DC

## 2018-02-16 MED ORDER — CYCLOBENZAPRINE HCL 10 MG PO TABS: 10.0000 mg | ORAL_TABLET | Freq: Three times a day (TID) | ORAL | 0 refills | Status: DC | PRN

## 2018-02-16 NOTE — Telephone Encounter (Signed)
Please advise that medications were sent in for refill.Janine Limbo. Tracie Harrier, MD

## 2018-02-16 NOTE — Progress Notes (Signed)
Patient left message on nurse line stating that she has received discharge letter, but it states she has 30 days to continue receiving refills until she find a new doctor. She has requested flexeril, lisinopril, thyroid medication and citalopram. Please advise if these are okay to fill for her.

## 2018-02-22 MED FILL — SUBOXONE 8 MG-2 MG SL FILM: 8-2 | 28 days supply | Qty: 84 | Fill #0

## 2018-02-22 MED FILL — PROMETHAZINE 25 MG TABLET: 25 | 30 days supply | Qty: 90 | Fill #1

## 2018-02-22 MED FILL — clonazePAM 0.5 MG TABS: 0.5 | 30 days supply | Qty: 60 | Fill #0

## 2018-03-01 ENCOUNTER — Encounter (HOSPITAL_COMMUNITY): Payer: Self-pay

## 2018-03-01 ENCOUNTER — Other Ambulatory Visit: Payer: Self-pay

## 2018-03-01 ENCOUNTER — Emergency Department (HOSPITAL_COMMUNITY): Payer: 59

## 2018-03-01 ENCOUNTER — Emergency Department (HOSPITAL_COMMUNITY)
Admission: EM | Admit: 2018-03-01 | Discharge: 2018-03-01 | Disposition: A | Discharge status: Home / Self Care | Payer: 59 | Attending: Emergency Medicine | Admitting: Emergency Medicine

## 2018-03-01 DIAGNOSIS — J441 Chronic obstructive pulmonary disease with (acute) exacerbation: Secondary | ICD-10-CM | POA: Diagnosis not present

## 2018-03-01 DIAGNOSIS — F17210 Nicotine dependence, cigarettes, uncomplicated: Secondary | ICD-10-CM | POA: Insufficient documentation

## 2018-03-01 DIAGNOSIS — E119 Type 2 diabetes mellitus without complications: Secondary | ICD-10-CM | POA: Insufficient documentation

## 2018-03-01 DIAGNOSIS — E039 Hypothyroidism, unspecified: Secondary | ICD-10-CM | POA: Diagnosis not present

## 2018-03-01 DIAGNOSIS — Z8673 Personal history of transient ischemic attack (TIA), and cerebral infarction without residual deficits: Secondary | ICD-10-CM | POA: Insufficient documentation

## 2018-03-01 DIAGNOSIS — I110 Hypertensive heart disease with heart failure: Secondary | ICD-10-CM | POA: Diagnosis not present

## 2018-03-01 DIAGNOSIS — R0602 Shortness of breath: Secondary | ICD-10-CM | POA: Diagnosis present

## 2018-03-01 DIAGNOSIS — Z79899 Other long term (current) drug therapy: Secondary | ICD-10-CM | POA: Insufficient documentation

## 2018-03-01 DIAGNOSIS — I509 Heart failure, unspecified: Secondary | ICD-10-CM | POA: Diagnosis not present

## 2018-03-01 LAB — CBC WITH DIFFERENTIAL/PLATELET
BASOS PCT: 1 %
Basophils Absolute: 0 10*3/uL (ref 0.0–0.1)
Eosinophils Absolute: 0.4 10*3/uL (ref 0.0–0.7)
Eosinophils Relative: 7 %
HEMATOCRIT: 36.6 % (ref 36.0–46.0)
Hemoglobin: 11.8 g/dL — ABNORMAL LOW (ref 12.0–15.0)
LYMPHS ABS: 1.6 10*3/uL (ref 0.7–4.0)
LYMPHS PCT: 28 %
MCH: 29.1 pg (ref 26.0–34.0)
MCHC: 32.2 g/dL (ref 30.0–36.0)
MCV: 90.1 fL (ref 78.0–100.0)
MONO ABS: 0.5 10*3/uL (ref 0.1–1.0)
MONOS PCT: 8 %
NEUTROS ABS: 3.2 10*3/uL (ref 1.7–7.7)
Neutrophils Relative %: 56 %
Platelets: 228 10*3/uL (ref 150–400)
RBC: 4.06 MIL/uL (ref 3.87–5.11)
RDW: 13.8 % (ref 11.5–15.5)
WBC: 5.6 10*3/uL (ref 4.0–10.5)

## 2018-03-01 LAB — COMPREHENSIVE METABOLIC PANEL
ALK PHOS: 78 U/L (ref 38–126)
ALT: 15 U/L (ref 14–54)
ANION GAP: 12 (ref 5–15)
AST: 15 U/L (ref 15–41)
Albumin: 3.7 g/dL (ref 3.5–5.0)
BILIRUBIN TOTAL: 0.5 mg/dL (ref 0.3–1.2)
BUN: 7 mg/dL (ref 6–20)
CALCIUM: 9.2 mg/dL (ref 8.9–10.3)
CO2: 28 mmol/L (ref 22–32)
Chloride: 104 mmol/L (ref 101–111)
Creatinine, Ser: 0.77 mg/dL (ref 0.44–1.00)
GFR calc Af Amer: >60 mL/min (ref >60)
Glucose, Bld: 121 mg/dL — ABNORMAL HIGH (ref 65–99)
POTASSIUM: 3.3 mmol/L — AB (ref 3.5–5.1)
Sodium: 144 mmol/L (ref 135–145)
TOTAL PROTEIN: 6.8 g/dL (ref 6.5–8.1)

## 2018-03-01 LAB — BRAIN NATRIURETIC PEPTIDE: B Natriuretic Peptide: 158 pg/mL — ABNORMAL HIGH (ref 0.0–100.0)

## 2018-03-01 LAB — TROPONIN I: Troponin I: <0.03 ng/mL (ref <0.03)

## 2018-03-01 MED ORDER — AZITHROMYCIN 250 MG PO TABS: 250.0000 mg | ORAL_TABLET | Freq: Every day | ORAL | 0 refills | Status: DC

## 2018-03-01 MED ORDER — ALBUTEROL SULFATE HFA 108 (90 BASE) MCG/ACT IN AERS
1.0000 | INHALATION_SPRAY | RESPIRATORY_TRACT | Status: DC | PRN
  Administered 2018-03-01: 2 via RESPIRATORY_TRACT
  Filled 2018-03-01: qty 6.7

## 2018-03-01 MED ORDER — ALBUTEROL SULFATE (2.5 MG/3ML) 0.083% IN NEBU
5.0000 mg | INHALATION_SOLUTION | Freq: Once | RESPIRATORY_TRACT | Status: AC
Start: 2018-03-01 — End: 2018-03-01
  Administered 2018-03-01: 5 mg via RESPIRATORY_TRACT
  Filled 2018-03-01: qty 6

## 2018-03-01 MED ORDER — LORATADINE 10 MG PO TABS: 10.0000 mg | ORAL_TABLET | Freq: Every day | ORAL | 0 refills | Status: DC

## 2018-03-01 MED ORDER — METHYLPREDNISOLONE SODIUM SUCC 125 MG IJ SOLR
125.0000 mg | Freq: Once | INTRAMUSCULAR | Status: AC
  Administered 2018-03-01: 125 mg via INTRAMUSCULAR
  Filled 2018-03-01: qty 2

## 2018-03-01 MED ORDER — PREDNISONE 20 MG PO TABS: ORAL_TABLET | ORAL | 0 refills | Status: DC

## 2018-03-01 NOTE — ED Provider Notes (Signed)
[facility] EMERGENCY DEPARTMENT Provider Note   [name]: [id number] Arrival date & time: 03/01/18  1101     History   Chief Complaint Chief Complaint  Patient presents with  . Shortness of Breath    HPI [name] is a 57 y.o. female.  HPI Presents with 3 weeks of persistent shortness of breath, cough productive of green sputum, subjective fevers and chills, intermittent bilateral feet swelling.  She is been using her rescue inhaler frequently.  She does have some chest pain only with coughing. Past Medical History:  Diagnosis Date  . Anxiety   . Asthma    Albuterol prn   . CHF (congestive heart failure) (HCC)    takes Furosemide daily  . Chronic back pain    DDD/scoliosis  . Complication of anesthesia    hard to wake up  . COPD (chronic obstructive pulmonary disease) (HCC)   . DDD (degenerative disc disease)   . Depression    takes Celexa,Clonazepam daily  . Diabetes mellitus    borderline  . Diverticulitis   . Edema of lower extremity   . Emphysema lung (HCC)   . Fibromyalgia   . Gastritis   . GERD (gastroesophageal reflux disease)    takes Pepcid daily  . Gout   . Gout of big toe    RIGHT  . Hemorrhoids   . Hiatal hernia   . History of bronchitis    last time 4-33months ago   . History of colon polyps   . History of kidney stones    is in her left kidney and has been for couple of yrs and no problems  . HTN (hypertension)    takes lisinopril daily  . Hyperlipidemia    but doesn't take any meds  . Hypothyroidism    takes Synthroid daily  . Insomnia    doesn't take any meds for this  . Joint pain   . Joint swelling   . Migraine    has one now  . Migraine   . MS (multiple sclerosis) (HCC)    early stages  . Muscle spasm    takes Flexeril daily as needed  . Neuropathy   . Osteoporosis   . Phlebitis   . Pneumonia    hx of;last time in 2014  . PONV (postoperative nausea and vomiting)   . Shortness of breath    with exertion or stressed   .  Sleep apnea    sleep study done at least 41yrs ago;has CPAP but doesn't use  . Stroke [facility])    left sided weakness  . TIA (transient ischemic attack)    Hx: of  . Urinary frequency     Patient Active Problem List   Diagnosis Date Noted  . Abdominal pain, chronic, epigastric 02/05/2018  . Nausea without vomiting 02/05/2018  . Fibromyalgia 11/02/2017  . Feeling of chest tightness 04/12/2015  . SOB (shortness of breath) 04/12/2015  . Hyperglycemia 04/08/2015  . Tobacco use disorder 04/08/2015  . Headache   . Left-sided weakness   . Hyperlipidemia 04/06/2015  . Migraine 04/05/2015  . Acute CVA (cerebrovascular accident) (HCC) 04/05/2015  . Acute blood loss anemia   . Hematochezia 11/15/2014  . Bilateral lower abdominal cramping 11/15/2014  . Rectal bleeding 11/15/2014  . Colitis, acute 11/15/2014  . Malnutrition of moderate degree (HCC) 04/17/2014  . Postoperative anemia due to acute blood loss 04/17/2014    Class: Acute  . Malnourished (HCC) 04/17/2014    Class: Present on Admission  .  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 Spondylolisthesis at L4-L5 level 04/14/2014  . Spondylosis of cervical spine 12/30/2013  . Cervical spondylosis without myelopathy 12/30/2013    Class: Chronic  . Spondylolisthesis of lumbar region 12/30/2013    Class: Chronic  . CAP (community acquired pneumonia) 08/24/2013  . GERD (gastroesophageal reflux disease) 08/24/2013  . COPD (chronic obstructive pulmonary disease) (HCC) 08/24/2013  . Chronic pain syndrome 08/24/2013  . Cigarette smoker 08/24/2013  . Chest pain 08/24/2013  . COPD exacerbation (HCC) 08/11/2012  . DM (diabetes mellitus) (HCC) 08/11/2012  . Leucocytosis 08/11/2012  . Hypertension 08/11/2012    Past Surgical History:  Procedure Laterality Date  . ABDOMINAL HYSTERECTOMY    . ANTERIOR CERVICAL DECOMP/DISCECTOMY FUSION N/A 12/30/2013   Procedure: ANTERIOR CERVICAL  DISCECTOMY FUSION C4-5, C5-6, plate and screws, allograft, local bone graft;  Surgeon: Kerrin Champagne, MD;  Location: [facility] OR;  Service: Orthopedics;  Laterality: N/A;  . BACK SURGERY     lumbar discectomy  . BIOPSY N/A 01/20/2015   Procedure: COLON BIOPSY;  Surgeon: West Bali, MD;  Location: [facility] ORS;  Service: Endoscopy;  Laterality: N/A;  . BLADDER SUSPENSION    . CARDIAC CATHETERIZATION    . CARPAL TUNNEL RELEASE Right   . CHOLECYSTECTOMY    . COLONOSCOPY    . COLONOSCOPY WITH PROPOFOL N/A 01/20/2015   SLF: 1. Normal ileum 2. Redundant left colon 3. Smalll internal hemorrhoids.   . ESOPHAGOGASTRODUODENOSCOPY  10/29/07   normal  . fundic gland polyp     benign  . POLYPECTOMY N/A 01/20/2015   Procedure: RECTAL POLYPECTOMY;  Surgeon: West Bali, MD;  Location: [facility] ORS;  Service: Endoscopy;  Laterality: N/A;  . SIGMOIDOSCOPY  01/31/08   large internal hemorrhoids/small rectal polyp removed/rare sigmoid diverticula     OB History    Gravida  2   Para  2   Term  0   Preterm  2   AB      Living  2     SAB      TAB      Ectopic      Multiple      Live Births               Home Medications    Prior to Admission medications   Medication Sig Start Date End Date Taking? Authorizing Provider  albuterol (PROVENTIL) (5 MG/ML) 0.5% nebulizer solution Take 1 mL (5 mg total) by nebulization every 6 (six) hours as needed for wheezing or shortness of breath. 08/26/17   Burgess Amor, PA-C  amLODipine (NORVASC) 10 MG tablet Take 1 tablet (10 mg total) by mouth daily. 11/15/17   Aliene Beams, MD  Aspirin-Acetaminophen-Caffeine (GOODY HEADACHE PO) Take 1 Package by mouth 2 (two) times daily.    [provider]  azithromycin (ZITHROMAX) 250 MG tablet Take 1 tablet (250 mg total) by mouth daily. Take first 2 tablets together, then 1 every day until finished. 03/01/18   Loren Racer, MD  citalopram (CELEXA) 40 MG tablet Take 40 mg by mouth daily.    [provider]  clonazePAM (KLONOPIN) 0.5 MG tablet Take 1 tablet (0.5 mg total) by mouth 3 (three) times daily as needed for anxiety. 11/02/17   Aliene Beams, MD  cyclobenzaprine (FLEXERIL) 10 MG tablet Take 1 tablet (10 mg total) by mouth 3 (three) times daily as needed for muscle spasms. 02/16/18   Aliene Beams, MD  famotidine (PEPCID) 20 MG tablet Take 2 tablets (40 mg total) by mouth daily. 04/08/15   Black, Lesle Chris, NP  fluticasone furoate-vilanterol (BREO ELLIPTA) 200-25 MCG/INH AEPB Inhale 1 puff into the lungs daily. 02/16/18   Aliene Beams, MD  furosemide (LASIX) 20 MG tablet Take 0.5 tablets (10 mg total) by mouth daily. Patient taking differently: Take 10 mg by mouth as needed.  11/15/17   Aliene Beams, MD  ibuprofen (ADVIL,MOTRIN) 800 MG tablet Take 800 mg by mouth 3 (three) times daily. 12/15/15   [provider]  levothyroxine (SYNTHROID, LEVOTHROID) 150 MCG tablet Take 1 tablet (150 mcg total) by mouth daily. 02/16/18   Aliene Beams, MD  lisinopril (PRINIVIL,ZESTRIL) 40 MG tablet Take 1 tablet (40 mg total) by mouth daily. 02/16/18   Aliene Beams, MD  loratadine (CLARITIN) 10 MG tablet Take 1 tablet (10 mg total) by mouth daily. 03/01/18   Loren Racer, MD  naloxegol oxalate (MOVANTIK) 12.5 MG TABS tablet Take 12.5 mg by mouth daily.     [provider]  pantoprazole (PROTONIX) 40 MG tablet Take 1 tablet (40 mg total) by mouth 2 (two) times daily before a meal. 02/05/18   Anice Paganini, NP  predniSONE (DELTASONE) 20 MG tablet 3 tabs po day one, then 2 po daily x 4 days 03/02/18   Loren Racer, MD  promethazine (PHENERGAN) 25 MG tablet Take 25 mg by mouth 3 (three) times daily. 12/15/15   [provider]  SUBOXONE 8-2 MG FILM Place 0.5 Film under the tongue 3 (three) times daily. 12/29/15   [provider]  tiotropium (SPIRIVA HANDIHALER) 18 MCG inhalation capsule Place 1 capsule (18 mcg total) daily into inhaler and inhale. 09/11/17   Aliene Beams, MD  traZODone (DESYREL) 150 MG tablet Take 150 mg by mouth at bedtime.  12/15/15   [provider]    Family History Family History  Problem Relation Age of Onset  . Diabetes Mother   . Hypertension Mother   . Arthritis Father   . Diabetes Sister   . Neuropathy Sister   . Transient ischemic attack Sister   . Heart disease Sister   . Hypertension Sister   . Cancer - Lung Other   . Cancer - Prostate Other   . Cancer - Other Other   . Colon cancer Paternal Uncle   . Colon cancer Brother        deceased age 21 from colon CA and brain CA    Social History Social History   Tobacco Use  . Smoking status: Current Some Day Smoker    Packs/day: 0.75    Years: 15.00    Pack years: 11.25    Types: Cigarettes  . Smokeless tobacco: Never Used  Substance Use Topics  . Alcohol use: No  . Drug use: No     Allergies   Demerol [meperidine]; Imitrex [sumatriptan]; Penicillins; Sulfa antibiotics; Abilify [aripiprazole]; Levaquin [levofloxacin in d5w]; Neurontin [gabapentin]; Toradol [ketorolac tromethamine]; Tramadol; and Doxycycline   Review of Systems Review of Systems  Constitutional: Positive for chills and fever. Negative for fatigue.  HENT: Negative for congestion, sore throat and trouble swallowing.   Respiratory: Positive for cough, shortness of breath and wheezing.   Cardiovascular: Positive for chest pain and leg swelling. Negative for palpitations.  Gastrointestinal: Negative for abdominal pain, constipation, diarrhea, nausea and vomiting.  Genitourinary: Negative for dysuria, flank pain and frequency.  Musculoskeletal: Negative for back pain, joint swelling, myalgias, neck pain and neck stiffness.  Skin: Negative for rash and wound.  Neurological: Negative for dizziness, weakness, light-headedness, numbness and headaches.  All other systems reviewed and are negative.    Physical Exam Updated Vital Signs BP (!) 185/87 (BP Location: Right Arm)   Pulse  78   Temp 99 F (37.2 C) (Oral)   Resp (!) 22   Ht 5\' 4"  (1.626 m)   Wt 59.9 kg (132 lb)   SpO2 96%   BMI 22.66 kg/m   Physical Exam  Constitutional: She is oriented to person, place, and time. She appears well-developed and well-nourished. No distress.  HENT:  Head: Normocephalic and atraumatic.  Mouth/Throat: Oropharynx is clear and moist. No oropharyngeal exudate.  Eyes: Pupils are equal, round, and reactive to light. EOM are normal.  Neck: Normal range of motion. Neck supple. No JVD present.  Cardiovascular: Normal rate and regular rhythm. Exam reveals no gallop and no friction rub.  No murmur heard. Pulmonary/Chest: Effort normal.  Diminished air movement.  Few scattered expiratory wheezes.  Abdominal: Soft. Bowel sounds are normal. There is no tenderness. There is no rebound and no guarding.  Musculoskeletal: Normal range of motion. She exhibits no edema or tenderness.  No lower extremity swelling, asymmetry or tenderness.  Distal pulses are 2+.  Lymphadenopathy:    She has no cervical adenopathy.  Neurological: She is alert and oriented to person, place, and time.  Moves all extremities without focal deficit.  Sensation intact.  Skin: Skin is warm and dry. No rash noted. She is not diaphoretic. No erythema.  Psychiatric: She has a normal mood and affect. Her behavior is normal.  Nursing note and vitals reviewed.    ED Treatments / Results  Labs (all labs ordered are listed, but only abnormal results are displayed) Labs Reviewed  CBC WITH DIFFERENTIAL/PLATELET - Abnormal; Notable for the following components:      Result Value   Hemoglobin 11.8 (*)    All other components within normal limits  COMPREHENSIVE METABOLIC PANEL - Abnormal; Notable for the following components:   Potassium 3.3 (*)    Glucose, Bld 121 (*)    All other components within normal limits  BRAIN NATRIURETIC PEPTIDE - Abnormal; Notable for the following components:   B Natriuretic Peptide 158.0  (*)    All other components within normal limits  TROPONIN I    EKG EKG Interpretation  Date/Time:  Thursday March 01 2018 11:17:33 EDT Ventricular Rate:  81 PR Interval:    QRS Duration: 87 QT Interval:  425 QTC Calculation: 494 R Axis:   74 Text Interpretation:  Sinus rhythm Borderline repolarization abnormality Borderline prolonged QT interval NO SIGNIFICANT CHANGE SINCE LAST TRACING YESTERDAY Confirmed by Loren Racer ([id number]) on 03/01/2018 11:46:32 AM   Radiology Dg Chest 2 View  Result Date: 03/01/2018 CLINICAL DATA:  Cough, shortness of breath, smoker, dizziness, COPD, emphysema, hypertension EXAM: CHEST - 2 VIEW COMPARISON:  12/14/2017 FINDINGS: Normal heart size, mediastinal contours, and pulmonary vascularity. Lungs clear. No pulmonary infiltrate, pleural effusion or pneumothorax. Bones demineralized with evidence of prior cervical spine fusion. IMPRESSION: No acute abnormalities. Electronically Signed   By: Ulyses Southward M.D.   On: 03/01/2018 12:27    Procedures Procedures (including critical care time)  Medications Ordered in ED Medications  albuterol (PROVENTIL HFA;VENTOLIN HFA) 108 (90 Base) MCG/ACT inhaler 1-2 puff (has no administration in time range)  methylPREDNISolone sodium succinate (SOLU-MEDROL) 125 mg/2 mL injection 125 mg (125 mg Intramuscular Given 03/01/18 1142)  albuterol (PROVENTIL) (2.5 MG/3ML) 0.083% nebulizer  solution 5 mg (5 mg Nebulization Given 03/01/18 1138)     Initial Impression / Assessment and Plan / ED Course  I have reviewed the triage vital signs and the nursing notes.  Pertinent labs & imaging results that were available during my care of the patient were reviewed by me and considered in my medical decision making (see chart for details).    X-ray without acute findings.  Patient is feeling better after nebulized treatment.  Given Solu-Medrol.  Likely infectious exacerbation of COPD.  May have allergic component as well.  Will give  short course of steroids, placed on antihistamine and antibiotics.  Patient advised to keep feet elevated and wear compression stockings as needed.  Chest pain is very atypical for coronary artery disease.  Likely related with her respiratory illness.  Troponin is normal and EKG without ischemic findings.  Strict return precautions have been given.   Final Clinical Impressions(s) / ED Diagnoses   Final diagnoses:  COPD exacerbation [facility])    ED Discharge Orders        Ordered    predniSONE (DELTASONE) 20 MG tablet     03/01/18 1439    azithromycin (ZITHROMAX) 250 MG tablet  Daily     03/01/18 1439    loratadine (CLARITIN) 10 MG tablet  Daily     03/01/18 1439       Loren Racer, MD 03/01/18 1439

## 2018-03-01 NOTE — ED Triage Notes (Signed)
Pt reports productive cough with green sputum x 2 weeks.  Reports chest pain with coughing.  Pt says now feels sob.

## 2018-03-22 MED FILL — clonazePAM 0.5 MG TABS: 0.5 | 30 days supply | Qty: 90 | Fill #0

## 2018-03-22 MED FILL — SUBOXONE 8 MG-2 MG SL FILM: 8-2 | 28 days supply | Qty: 84 | Fill #0

## 2018-03-22 MED FILL — PROMETHAZINE 25 MG TABLET: 25 | 30 days supply | Qty: 90 | Fill #2

## 2018-03-28 ENCOUNTER — Other Ambulatory Visit: Payer: Self-pay | Admitting: Family Medicine

## 2018-03-28 DIAGNOSIS — M797 Fibromyalgia: Secondary | ICD-10-CM

## 2018-03-30 NOTE — Patient Instructions (Signed)
[name]  03/30/2018     @PREFPERIOPPHARMACY @   Your procedure is scheduled on 04/10/2018.  Report to [name] at 7:30 A.M.  Call this number if you have problems the morning of surgery:  [phone]   Remember:  No food or drink after midnight.     Take these medicines the morning of surgery with A SIP OF WATER Amlodipine, Celexa, Klonopin, Celexa, Nexium, Pepcid, Synthroid, Lisinopril, Claritin, Protonix,  Phenergan if needed, Suboxone, Proventil neb, Spiriva and Afrin     Do not wear jewelry, make-up or nail polish.  Do not wear lotions, powders, or perfumes, or deodorant.  Do not shave 48 hours prior to surgery.  Men may shave face and neck.  Do not bring valuables to the hospital.  [facility] is not responsible for any belongings or valuables.  Contacts, dentures or bridgework may not be worn into surgery.  Leave your suitcase in the car.  After surgery it may be brought to your room.  For patients admitted to the hospital, discharge time will be determined by your treatment team.  Patients discharged the day of surgery will not be allowed to drive home.    Please read over the following fact sheets that you were given. Anesthesia Post-op Instructions     PATIENT INSTRUCTIONS POST-ANESTHESIA  IMMEDIATELY FOLLOWING SURGERY:  Do not drive or operate machinery for the first twenty four hours after surgery.  Do not make any important decisions for twenty four hours after surgery or while taking narcotic pain medications or sedatives.  If you develop intractable nausea and vomiting or a severe headache please notify your doctor immediately.  FOLLOW-UP:  Please make an appointment with your surgeon as instructed. You do not need to follow up with anesthesia unless specifically instructed to do so.  WOUND CARE INSTRUCTIONS (if applicable):  Keep a dry clean dressing on the anesthesia/puncture wound site if there is drainage.  Once the wound has quit draining you may  leave it open to air.  Generally you should leave the bandage intact for twenty four hours unless there is drainage.  If the epidural site drains for more than 36-48 hours please call the anesthesia department.  QUESTIONS?:  Please feel free to call your physician or the hospital operator if you have any questions, and they will be happy to assist you.      Esophagogastroduodenoscopy Esophagogastroduodenoscopy (EGD) is a procedure to examine the lining of the esophagus, stomach, and first part of the small intestine (duodenum). This procedure is done to check for problems such as inflammation, bleeding, ulcers, or growths. During this procedure, a long, flexible, lighted tube with a camera attached (endoscope) is inserted down the throat. Tell a health care provider about:  Any allergies you have.  All medicines you are taking, including vitamins, herbs, eye drops, creams, and over-the-counter medicines.  Any problems you or family members have had with anesthetic medicines.  Any blood disorders you have.  Any surgeries you have had.  Any medical conditions you have.  Whether you are pregnant or may be pregnant. What are the risks? Generally, this is a safe procedure. However, problems may occur, including:  Infection.  Bleeding.  A tear (perforation) in the esophagus, stomach, or duodenum.  Trouble breathing.  Excessive sweating.  Spasms of the larynx.  A slowed heartbeat.  Low blood pressure.  What happens before the procedure?  Follow instructions from your health care provider about eating or drinking restrictions.  Ask  your health care provider about: ? Changing or stopping your regular medicines. This is especially important if you are taking diabetes medicines or blood thinners. ? Taking medicines such as aspirin and ibuprofen. These medicines can thin your blood. Do not take these medicines before your procedure if your health care provider instructs you not  to.  Plan to have someone take you home after the procedure.  If you wear dentures, be ready to remove them before the procedure. What happens during the procedure?  To reduce your risk of infection, your health care team will wash or sanitize their hands.  An IV tube will be put in a vein in your hand or arm. You will get medicines and fluids through this tube.  You will be given one or more of the following: ? A medicine to help you relax (sedative). ? A medicine to numb the area (local anesthetic). This medicine may be sprayed into your throat. It will make you feel more comfortable and keep you from gagging or coughing during the procedure. ? A medicine for pain.  A mouth guard may be placed in your mouth to protect your teeth and to keep you from biting on the endoscope.  You will be asked to lie on your left side.  The endoscope will be lowered down your throat into your esophagus, stomach, and duodenum.  Air will be put into the endoscope. This will help your health care provider see better.  The lining of your esophagus, stomach, and duodenum will be examined.  Your health care provider may: ? Take a tissue sample so it can be looked at in a lab (biopsy). ? Remove growths. ? Remove objects (foreign bodies) that are stuck. ? Treat any bleeding with medicines or other devices that stop tissue from bleeding. ? Widen (dilate) or stretch narrowed areas of your esophagus and stomach.  The endoscope will be taken out. The procedure may vary among health care providers and hospitals. What happens after the procedure?  Your blood pressure, heart rate, breathing rate, and blood oxygen level will be monitored often until the medicines you were given have worn off.  Do not eat or drink anything until the numbing medicine has worn off and your gag reflex has returned. This information is not intended to replace advice given to you by your health care provider. Make sure you discuss  any questions you have with your health care provider. Document Released: 02/24/2005 Document Revised: 03/31/2016 Document Reviewed: 09/17/2015 Elsevier Interactive Patient Education  [employer].

## 2018-04-03 ENCOUNTER — Encounter (HOSPITAL_COMMUNITY)
Admission: RE | Admit: 2018-04-03 | Discharge: 2018-04-03 | Disposition: A | Discharge status: Home / Self Care | Payer: 59 | Source: Ambulatory Visit | Attending: Gastroenterology | Admitting: Gastroenterology

## 2018-04-05 ENCOUNTER — Encounter (HOSPITAL_COMMUNITY)
Admission: RE | Admit: 2018-04-05 | Discharge: 2018-04-05 | Disposition: A | Discharge status: Home / Self Care | Payer: 59 | Source: Ambulatory Visit | Attending: Gastroenterology | Admitting: Gastroenterology

## 2018-04-05 ENCOUNTER — Other Ambulatory Visit: Payer: Self-pay

## 2018-04-05 ENCOUNTER — Encounter (HOSPITAL_COMMUNITY): Payer: Self-pay

## 2018-04-05 ENCOUNTER — Other Ambulatory Visit: Payer: Self-pay | Admitting: Gastroenterology

## 2018-04-05 DIAGNOSIS — Z01818 Encounter for other preprocedural examination: Secondary | ICD-10-CM | POA: Diagnosis not present

## 2018-04-05 LAB — BASIC METABOLIC PANEL
ANION GAP: 9 (ref 5–15)
BUN: 10 mg/dL (ref 6–20)
CALCIUM: 9.1 mg/dL (ref 8.9–10.3)
CO2: 27 mmol/L (ref 22–32)
Chloride: 104 mmol/L (ref 101–111)
Creatinine, Ser: 1.04 mg/dL — ABNORMAL HIGH (ref 0.44–1.00)
GFR calc Af Amer: >60 mL/min (ref >60)
GFR calc non Af Amer: 58 mL/min — ABNORMAL LOW (ref >60)
GLUCOSE: 122 mg/dL — AB (ref 65–99)
Potassium: 3.7 mmol/L (ref 3.5–5.1)
Sodium: 140 mmol/L (ref 135–145)

## 2018-04-05 LAB — CBC
HEMATOCRIT: 38.9 % (ref 36.0–46.0)
Hemoglobin: 12.8 g/dL (ref 12.0–15.0)
MCH: 29.8 pg (ref 26.0–34.0)
MCHC: 32.9 g/dL (ref 30.0–36.0)
MCV: 90.5 fL (ref 78.0–100.0)
Platelets: 253 10*3/uL (ref 150–400)
RBC: 4.3 MIL/uL (ref 3.87–5.11)
RDW: 14.5 % (ref 11.5–15.5)
WBC: 7.7 10*3/uL (ref 4.0–10.5)

## 2018-04-05 LAB — TSH: TSH: 103.37 u[IU]/mL — ABNORMAL HIGH (ref 0.350–4.500)

## 2018-04-05 MED ORDER — LEVOTHYROXINE SODIUM 150 MCG PO TABS: 150.0000 ug | ORAL_TABLET | Freq: Every day | ORAL | 0 refills | Status: DC

## 2018-04-05 NOTE — Telephone Encounter (Signed)
Pt without a PCP and IS OUT OF SYNTHROID. APPT WITH PCP NEXT FRI. RX FOR 30 SYNTHROID SENT. CHECK TSH TODAY.

## 2018-04-06 ENCOUNTER — Telehealth: Payer: Self-pay | Admitting: Gastroenterology

## 2018-04-06 NOTE — Telephone Encounter (Signed)
PLEASE CALL PT. HER TSH IS 103. THIS IS DUE TO HER NOT TAKING SYNTHROID, HER EGD MUST BE CANCELED AND WILL NEED TO WAIT FOR ONE MONTH UNTIL HER THYROID DISEASE IS BETTER CONTROLLED. SHE SHOULD SEE HER PCP FOR REFILLS.

## 2018-04-09 NOTE — Telephone Encounter (Signed)
Called and informed pt. She re-started Synthroid 04/06/18. EGD w/Propofol rescheduled to 06/26/18 at 9:30am (1st available). Endo scheduler informed. Pre-op appt 06/20/18 at 10:00am. Called pt back to inform of pre-op appt, no answer, LMOVM. Instructions and pre-op letter mailed to pt.

## 2018-04-10 ENCOUNTER — Ambulatory Visit (HOSPITAL_COMMUNITY): Admit: 2018-04-10 | Payer: 59 | Admitting: Internal Medicine

## 2018-04-10 ENCOUNTER — Encounter (HOSPITAL_COMMUNITY): Payer: Self-pay

## 2018-04-10 SURGERY — ESOPHAGOGASTRODUODENOSCOPY (EGD) WITH PROPOFOL
Anesthesia: Monitor Anesthesia Care

## 2018-04-13 ENCOUNTER — Other Ambulatory Visit (HOSPITAL_COMMUNITY): Payer: Self-pay | Admitting: Physician Assistant

## 2018-04-13 DIAGNOSIS — Z1231 Encounter for screening mammogram for malignant neoplasm of breast: Secondary | ICD-10-CM

## 2018-04-19 MED FILL — SUBOXONE 8 MG-2 MG SL FILM: 8-2 | 28 days supply | Qty: 84 | Fill #0

## 2018-04-19 MED FILL — clonazePAM 0.5 MG TABS: 0.5 | 30 days supply | Qty: 90 | Fill #0

## 2018-04-20 ENCOUNTER — Ambulatory Visit (HOSPITAL_COMMUNITY): Payer: Self-pay

## 2018-04-26 ENCOUNTER — Other Ambulatory Visit: Payer: Self-pay | Admitting: Physician Assistant

## 2018-04-26 ENCOUNTER — Ambulatory Visit
Admission: RE | Admit: 2018-04-26 | Discharge: 2018-04-26 | Disposition: A | Discharge status: Home / Self Care | Payer: 59 | Source: Ambulatory Visit | Attending: Physician Assistant | Admitting: Physician Assistant

## 2018-04-26 DIAGNOSIS — R062 Wheezing: Secondary | ICD-10-CM

## 2018-05-15 MED FILL — SUBOXONE 8 MG-2 MG SL FILM: 8-2 | 28 days supply | Qty: 84 | Fill #0

## 2018-05-16 ENCOUNTER — Ambulatory Visit (INDEPENDENT_AMBULATORY_CARE_PROVIDER_SITE_OTHER): Payer: 59 | Admitting: Nurse Practitioner

## 2018-05-16 ENCOUNTER — Telehealth: Payer: Self-pay

## 2018-05-16 ENCOUNTER — Encounter: Payer: Self-pay | Admitting: Nurse Practitioner

## 2018-05-16 VITALS — BP 103/59 | HR 85 | Temp 97.0°F | Ht 64.0 in | Wt 141.2 lb

## 2018-05-16 DIAGNOSIS — R1013 Epigastric pain: Secondary | ICD-10-CM

## 2018-05-16 DIAGNOSIS — G8929 Other chronic pain: Secondary | ICD-10-CM | POA: Diagnosis not present

## 2018-05-16 DIAGNOSIS — R112 Nausea with vomiting, unspecified: Secondary | ICD-10-CM | POA: Diagnosis not present

## 2018-05-16 NOTE — Assessment & Plan Note (Signed)
Previously she was having intermittent nausea.  Now she is progressed to having nausea and vomiting.  When she vomits it is mostly at night and "watery".  This is a progression of her symptoms.  EGD previously canceled due to poor control of TSH because she was not taking her Synthroid.  She is back on Synthroid and her TSH was checked yesterday at [facility] clinic and found to be normal.  We will see if we can move up her EGD appointment.  ER precautions were given.  Follow-up in 3 months.

## 2018-05-16 NOTE — Assessment & Plan Note (Signed)
Persistent epigastric pain which seems worse today.  Her pain is dull constantly and has 3-4 episodes of stabbing/sharp pain a day.  This is somewhat worse compared to her last visit.  Her previous EGD was canceled due to TSH of greater than 100 because she was not taking Synthroid.  Her EGD was rescheduled for August 20.  Recommended she continue her current medications (PPI and H2 receptor blocker).  Recommend avoiding NSAIDs as much as possible.  She is currently taking ibuprofen 800 mg 3 times a day, Goody powder once a day, daily aspirin 81 mg daily.  We will see if we can move up her EGD due to progression of her symptoms.  She did have her TSH checked yesterday and I verified in care everywhere that it was normal.  Follow-up in 3 months.  ER precautions were given.

## 2018-05-16 NOTE — Patient Instructions (Signed)
1. Continue taking her current medications. 2. Now that your TSH/thyroid levels are normal again, we will see if we can move up your upper endoscopy. 3. As we discussed, if you have any black stools, vomiting blood, vomiting black material, dizziness, lightheadedness, weakness, shortness of breath and proceed to the emergency room. 4. Follow-up in 3 months. 5. Call us if you have any questions or concerns.  At [facility] we value your feedback. You may receive a survey about your visit today. Please share your experience as we strive to create trusting relationships with our patients to provide genuine, compassionate, quality care.  It was good to see you today!  I hope you have a wonderful summer!!

## 2018-05-16 NOTE — Telephone Encounter (Signed)
Called and informed endo scheduler, EGD moved to 05/22/18 at 9:00am. Pre-op appt changed to 05/18/18 at 11:00am.   Called and informed pt of pre-op appt.

## 2018-05-16 NOTE — Progress Notes (Signed)
CC'ED TO PCP 

## 2018-05-16 NOTE — Telephone Encounter (Signed)
Called [employer] to update date of service for EGD that had previously been approved for 04/10/18. Procedure scheduled for 05/22/18. Date of service changed. PA# same as before: [id number].

## 2018-05-16 NOTE — Progress Notes (Addendum)
REVIEWED. NAUSEA LIKELY MULTIFACTORIAL, MEDS, UNCONTROLLED GERD.  Referring Provider: Caren Macadam, MD Primary Care Physician:  [name], PA-C Primary GI:  Dr. Oneida Alar  Chief Complaint  Patient presents with  . Abdominal Pain    upper abd, sharp, stabbing pain at times but mostly dull pain  . Nausea    w/ vomiting. happens mostly at night. vomit is "watery"  . EGD    scheduled for 06/26/18 w/ SL[facility]    HPI:   [name] is a 57 y.o. female who presents for follow-up on abdominal pain, nausea, EGD.  Patient was last seen in our office 02/05/2018 for abdominal pain and nausea.  Colonoscopy up-to-date 2016 and recommended high-fiber diet, dicyclomine 20 mg 30 minutes prior to meals and repeat colonoscopy in 10 years.  Previous hospital admission in 2016 for colitis and rectal bleeding.  CT with colitis including etiologies infectious versus ischemic.  Stool path was negative.  TSH very low previously and medication was adjusted.  Bentyl has not helped.  Objective weight loss.  At her last visit she was doing okay, epigastric pain 20 to 30 minutes postprandial but also she has not eaten or stressed.  Nausea without vomiting.  Pepcid intermittently which helps.  Previous EGD 2008 with gastritis and gastric polyps.  States she has lost "57 pounds" while she was hooked on pain pills and started Suboxone.  Chronic poor appetite.  Objectively her weight was up 13 pounds in the previous 3 years.  Takes ibuprofen 800 mg 3 times a day, occasional Goody powders.  Daily aspirin 81 mg also.  She was seen in the emergency department at both any pain and [facility] for abdominal pain but left AMA both times.  Recommended Protonix 40 mg twice a day, endoscopy, follow-up in 3 months.  Her TSH on 04/06/2018 was found to be 103 because she was not taking Synthroid.  EGD was canceled and postponed to July 20 so she could get back on TSH.  Her EGD is scheduled for propofol/MAC.  Today she states she's still  having symptoms. Had TSH checked yesterday at [facility], is taking her Synthroid. I checked in [place] and her TSH is 1.582 yesterday (normal). Pain is epigastric, burning/sharp. Dull pain is constant, stabbing pain is 3-4 times a day and lasts about 10-15 minutes, no worse after eating. Has nausea with vomiting, watery emesis. No hematemesis. Denies hematochezia, melena. No other abdominal pain, fever, chills, unintentional weight loss. Denies chest pain, dyspnea, dizziness, lightheadedness, syncope, near syncope. Denies any other upper or lower GI symptoms.  Still on ASA powders daily, Ibuprofen 800 mg tid, and daily ASA 72m (though she states if she's had a Goody powder, she will not take her 81 mg ASA).   Still on Protonix 40 mg bid.  Past Medical History:  Diagnosis Date  . Anxiety   . Asthma    Albuterol prn   . CHF (congestive heart failure) ([facility]    takes Furosemide daily  . Chronic back pain    DDD/scoliosis  . Complication of anesthesia    hard to wake up  . COPD (chronic obstructive pulmonary disease) ([facility]   . DDD (degenerative disc disease)   . Depression    takes Celexa,Clonazepam daily  . Diabetes mellitus    borderline  . Diverticulitis   . Edema of lower extremity   . Emphysema lung ([place]   . Fibromyalgia   . Gastritis   . GERD (gastroesophageal reflux disease)    takes Pepcid  daily  . Gout   . Gout of big toe    RIGHT  . Hemorrhoids   . Hiatal hernia   . History of bronchitis    last time 4-74month ago   . History of colon polyps   . History of kidney stones    is in her left kidney and has been for couple of yrs and no problems  . HTN (hypertension)    takes lisinopril daily  . Hyperlipidemia    but doesn't take any meds  . Hypothyroidism    takes Synthroid daily  . Insomnia    doesn't take any meds for this  . Joint pain   . Joint swelling   . Migraine    last 04/04/18  . Migraine   . MS (multiple sclerosis) ([place]    early stages  .  Muscle spasm    takes Flexeril daily as needed  . Neuropathy   . Osteoporosis   . Phlebitis   . Pneumonia    hx of;last time in 2014  . PONV (postoperative nausea and vomiting)   . Shortness of breath    with exertion or stressed   . Sleep apnea    sleep study done at least 878yrago;has CPAP but doesn't use  . [facility]   left sided weakness  . TIA (transient ischemic attack)    Hx: of  . Urinary frequency     Past Surgical History:  Procedure Laterality Date  . ABDOMINAL HYSTERECTOMY    . ANTERIOR CERVICAL DECOMP/DISCECTOMY FUSION N/A 12/30/2013   Procedure: ANTERIOR CERVICAL DISCECTOMY FUSION C4-5, C5-6, plate and screws, allograft, local bone graft;  Surgeon: JaJessy OtoMD;  [place] [facility] Service: [facility];  Laterality: N/A;  . BACK SURGERY     lumbar discectomy  . BIOPSY N/A 01/20/2015   Procedure: COLON BIOPSY;  Surgeon: SaDanie BinderMD;  Location: [facility];  Service: Endoscopy;  Laterality: N/A;  . BLADDER SUSPENSION    . CARDIAC CATHETERIZATION    . CARPAL TUNNEL RELEASE Right   . CHOLECYSTECTOMY    . COLONOSCOPY    . COLONOSCOPY WITH PROPOFOL N/A 01/20/2015   SLF: 1. Normal ileum 2. Redundant left colon 3. Smalll internal hemorrhoids.   . ESOPHAGOGASTRODUODENOSCOPY  10/29/07   normal  . fundic gland polyp     benign  . POLYPECTOMY N/A 01/20/2015   Procedure: RECTAL POLYPECTOMY;  Surgeon: SaDanie BinderMD;  Location: [facility];  Service: Endoscopy;  Laterality: N/A;  . SIGMOIDOSCOPY  01/31/08   large internal hemorrhoids/small rectal polyp removed/rare sigmoid diverticula    Current Outpatient Medications  Medication Sig Dispense Refill  . albuterol (PROVENTIL) (5 MG/ML) 0.5% nebulizer solution Take 1 mL (5 mg total) by nebulization every 6 (six) hours as needed for wheezing or shortness of breath. (Patient taking differently: Take 5 mg by nebulization 2 (two) times daily. ) 20 mL 1  . amLODipine (NORVASC) 10 MG tablet Take 1 tablet (10 mg total) by  mouth daily. 90 tablet 3  . Aspirin-Acetaminophen-Caffeine (GOODY HEADACHE PO) Take 1 Package by mouth daily as needed (headaches).     . bismuth subsalicylate (PEPTO BISMOL) 262 MG chewable tablet Chew 524 mg by mouth as needed for indigestion or diarrhea or loose stools.    . citalopram (CELEXA) 40 MG tablet Take 40 mg by mouth daily.    . clonazePAM (KLONOPIN) 0.5 MG tablet Take 1 tablet (0.5 mg total) by mouth 3 (three) times daily  as needed for anxiety. 30 tablet 0  . cyclobenzaprine (FLEXERIL) 10 MG tablet Take 1 tablet (10 mg total) by mouth 3 (three) times daily as needed for muscle spasms. 60 tablet 0  . famotidine (PEPCID) 20 MG tablet Take 2 tablets (40 mg total) by mouth daily.    . fluticasone furoate-vilanterol (BREO ELLIPTA) 200-25 MCG/INH AEPB Inhale 1 puff into the lungs daily. 1 each 1  . furosemide (LASIX) 20 MG tablet Take 0.5 tablets (10 mg total) by mouth daily. (Patient taking differently: Take 20 mg by mouth daily as needed for fluid or edema. ) 45 tablet 1  . ibuprofen (ADVIL,MOTRIN) 800 MG tablet Take 800 mg by mouth 3 (three) times daily.  0  . levothyroxine (SYNTHROID, LEVOTHROID) 150 MCG tablet Take 1 tablet (150 mcg total) by mouth daily. 30 tablet 0  . lisinopril (PRINIVIL,ZESTRIL) 40 MG tablet Take 1 tablet (40 mg total) by mouth daily. 90 tablet 1  . oxymetazoline (AFRIN) 0.05 % nasal spray Place 1 spray into both nostrils daily as needed for congestion.    . pantoprazole (PROTONIX) 40 MG tablet Take 1 tablet (40 mg total) by mouth 2 (two) times daily before a meal. 60 tablet 3  . promethazine (PHENERGAN) 25 MG tablet Take 25 mg by mouth 3 (three) times daily.  0  . SUBOXONE 8-2 MG FILM Place 1 Film under the tongue 3 (three) times daily.     Marland Kitchen tiotropium (SPIRIVA HANDIHALER) 18 MCG inhalation capsule Place 1 capsule (18 mcg total) daily into inhaler and inhale. 30 capsule 11  . traZODone (DESYREL) 150 MG tablet Take 150 mg by mouth at bedtime.   0  . azithromycin  (ZITHROMAX) 250 MG tablet Take 1 tablet (250 mg total) by mouth daily. Take first 2 tablets together, then 1 every day until finished. (Patient not taking: Reported on 03/23/2018) 6 tablet 0  . esomeprazole (NEXIUM) 20 MG capsule Take 20 mg by mouth daily.    Marland Kitchen loratadine (CLARITIN) 10 MG tablet Take 1 tablet (10 mg total) by mouth daily. (Patient not taking: Reported on 05/16/2018) 30 tablet 0  . predniSONE (DELTASONE) 20 MG tablet 3 tabs po day one, then 2 po daily x 4 days (Patient not taking: Reported on 03/23/2018) 11 tablet 0   Current Facility-Administered Medications  Medication Dose Route Frequency Provider Last Rate Last Dose  . methylPREDNISolone acetate (DEPO-MEDROL) injection 80 mg  80 mg Intramuscular Once Caren Macadam, MD        Allergies as of 05/16/2018 - Review Complete 05/16/2018  Allergen Reaction Noted  . Demerol [meperidine] Anaphylaxis and Swelling 08/06/2012  . Imitrex [sumatriptan] Anaphylaxis 04/05/2015  . Penicillins Anaphylaxis and Swelling 08/06/2012  . Sulfa antibiotics Anaphylaxis 08/06/2012  . Abilify [aripiprazole] Nausea And Vomiting 11/23/2017  . Levaquin [levofloxacin in d5w]  04/10/2014  . Neurontin [gabapentin] Other (See Comments) 11/04/2013  . Toradol [ketorolac tromethamine] Other (See Comments) 11/15/2014  . Tramadol Other (See Comments) 01/08/2015  . Doxycycline Nausea And Vomiting 09/21/2012    Family History  Problem Relation Age of Onset  . Diabetes Mother   . Hypertension Mother   . Arthritis Father   . Diabetes Sister   . Neuropathy Sister   . Transient ischemic attack Sister   . Heart disease Sister   . Hypertension Sister   . Cancer - Lung Other   . Cancer - Prostate Other   . Cancer - Other Other   . Colon cancer Paternal Uncle   . Colon  cancer Brother        deceased age 24 from colon CA and brain CA    Social History   Socioeconomic History  . Marital status: Married    Spouse name: Not on file  . Number of children:  Not on file  . Years of education: Not on file  . Highest education level: Not on file  Occupational History  . Not on file  Social Needs  . Financial resource strain: Not on file  . Food insecurity:    Worry: Not on file    Inability: Not on file  . Transportation needs:    Medical: Not on file    Non-medical: Not on file  Tobacco Use  . Smoking status: Current Some Day Smoker    Packs/day: 0.75    Years: 15.00    Pack years: 11.25    Types: Cigarettes  . Smokeless tobacco: Never Used  Substance and Sexual Activity  . Alcohol use: No  . Drug use: No  . Sexual activity: Yes    Birth control/protection: Surgical  Lifestyle  . Physical activity:    Days per week: Not on file    Minutes per session: Not on file  . Stress: Not on file  Relationships  . Social connections:    Talks on phone: More than three times a week    Gets together: More than three times a week    Attends religious service: More than 4 times per year    Active member of club or organization: No    Attends meetings of clubs or organizations: Never    Relationship status: Widowed  Other Topics Concern  . Not on file  Social History Narrative   Patient is married and has children.  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  Denies any alcohol or other drug use.  Reports that 1 of her children is also dealing with narcotic addiction and also goes to the Suboxone clinic with her.   Eats meat fruits and vegetables.   Currently smokes.   Not interested in quitting smoking at this time.   Wears seatbelt.    Review of Systems: Complete ROS negative except as per HPI.   Physical Exam: BP (!) 103/59   Pulse 85   Temp (!) 97 F (36.1 C) (Oral)   Ht 5' 4"  (1.626 m)   Wt 141 lb 3.2 oz (64 kg)   BMI 24.24 kg/m  General:   Alert and oriented. Pleasant and cooperative. Well-nourished and well-developed.  Eyes:  Without icterus, sclera clear and conjunctiva pink.  Ears:   Normal auditory acuity. Cardiovascular:  S1, S2 present without murmurs appreciated. Extremities without clubbing or edema. Respiratory:  Clear to auscultation bilaterally. No wheezes, rales, or rhonchi. No distress.  Gastrointestinal:  +BS, soft, and non-distended. Moderate to significant epigastric TTP noted. No HSM noted. No guarding or rebound. No masses appreciated.  Rectal:  Deferred  Musculoskalatal:  Symmetrical without gross deformities.  Skin:  Intact without significant lesions or rashes. Neurologic:  Alert and oriented x4;  grossly normal neurologically. Psych:  Alert and cooperative. Normal mood and affect. Heme/Lymph/Immune: No excessive bruising noted.    05/16/2018 11:56 AM   Disclaimer: This note was dictated with voice recognition software. Similar sounding words can inadvertently be transcribed and may not be corrected upon review.

## 2018-05-17 NOTE — Patient Instructions (Signed)
[name]  05/17/2018     @PREFPERIOPPHARMACY @   Your procedure is scheduled on 05/22/2018.  Report to [facility] at 7:00 A.M.  Call this number if you have problems the morning of surgery:  [phone]   Remember:  Do not eat or drink after midnight.      Take these medicines the morning of surgery with A SIP OF WATER Albuterol inhaler, Amlodipine, Celexa, Klonopin, Flexeril if needed, Pepcid, Spiriva, Breo Ellipta  Synthroid, Lisinopril, Protonix, Phenergan if needed, Suboxone    Do not wear jewelry, make-up or nail polish.  Do not wear lotions, powders, or perfumes, or deodorant.  Do not shave 48 hours prior to surgery.  Men may shave face and neck.  Do not bring valuables to the hospital.  [facility] is not responsible for any belongings or valuables.  Contacts, dentures or bridgework may not be worn into surgery.  Leave your suitcase in the car.  After surgery it may be brought to your room.  For patients admitted to the hospital, discharge time will be determined by your treatment team.  Patients discharged the day of surgery will not be allowed to drive home.    Please read over the following fact sheets that you were given. Anesthesia Post-op Instructions     PATIENT INSTRUCTIONS POST-ANESTHESIA  IMMEDIATELY FOLLOWING SURGERY:  Do not drive or operate machinery for the first twenty four hours after surgery.  Do not make any important decisions for twenty four hours after surgery or while taking narcotic pain medications or sedatives.  If you develop intractable nausea and vomiting or a severe headache please notify your doctor immediately.  FOLLOW-UP:  Please make an appointment with your surgeon as instructed. You do not need to follow up with anesthesia unless specifically instructed to do so.  WOUND CARE INSTRUCTIONS (if applicable):  Keep a dry clean dressing on the anesthesia/puncture wound site if there is drainage.  Once the wound has quit draining  you may leave it open to air.  Generally you should leave the bandage intact for twenty four hours unless there is drainage.  If the epidural site drains for more than 36-48 hours please call the anesthesia department.  QUESTIONS?:  Please feel free to call your physician or the hospital operator if you have any questions, and they will be happy to assist you.      Esophagogastroduodenoscopy Esophagogastroduodenoscopy (EGD) is a procedure to examine the lining of the esophagus, stomach, and first part of the small intestine (duodenum). This procedure is done to check for problems such as inflammation, bleeding, ulcers, or growths. During this procedure, a long, flexible, lighted tube with a camera attached (endoscope) is inserted down the throat. Tell a health care provider about:  Any allergies you have.  All medicines you are taking, including vitamins, herbs, eye drops, creams, and over-the-counter medicines.  Any problems you or family members have had with anesthetic medicines.  Any blood disorders you have.  Any surgeries you have had.  Any medical conditions you have.  Whether you are pregnant or may be pregnant. What are the risks? Generally, this is a safe procedure. However, problems may occur, including:  Infection.  Bleeding.  A tear (perforation) in the esophagus, stomach, or duodenum.  Trouble breathing.  Excessive sweating.  Spasms of the larynx.  A slowed heartbeat.  Low blood pressure.  What happens before the procedure?  Follow instructions from your health care provider about eating or drinking restrictions.  Ask your health care provider about: ? Changing or stopping your regular medicines. This is especially important if you are taking diabetes medicines or blood thinners. ? Taking medicines such as aspirin and ibuprofen. These medicines can thin your blood. Do not take these medicines before your procedure if your health care provider instructs  you not to.  Plan to have someone take you home after the procedure.  If you wear dentures, be ready to remove them before the procedure. What happens during the procedure?  To reduce your risk of infection, your health care team will wash or sanitize their hands.  An IV tube will be put in a vein in your hand or arm. You will get medicines and fluids through this tube.  You will be given one or more of the following: ? A medicine to help you relax (sedative). ? A medicine to numb the area (local anesthetic). This medicine may be sprayed into your throat. It will make you feel more comfortable and keep you from gagging or coughing during the procedure. ? A medicine for pain.  A mouth guard may be placed in your mouth to protect your teeth and to keep you from biting on the endoscope.  You will be asked to lie on your left side.  The endoscope will be lowered down your throat into your esophagus, stomach, and duodenum.  Air will be put into the endoscope. This will help your health care provider see better.  The lining of your esophagus, stomach, and duodenum will be examined.  Your health care provider may: ? Take a tissue sample so it can be looked at in a lab (biopsy). ? Remove growths. ? Remove objects (foreign bodies) that are stuck. ? Treat any bleeding with medicines or other devices that stop tissue from bleeding. ? Widen (dilate) or stretch narrowed areas of your esophagus and stomach.  The endoscope will be taken out. The procedure may vary among health care providers and hospitals. What happens after the procedure?  Your blood pressure, heart rate, breathing rate, and blood oxygen level will be monitored often until the medicines you were given have worn off.  Do not eat or drink anything until the numbing medicine has worn off and your gag reflex has returned. This information is not intended to replace advice given to you by your health care provider. Make sure you  discuss any questions you have with your health care provider. Document Released: 02/24/2005 Document Revised: 03/31/2016 Document Reviewed: 09/17/2015 Elsevier Interactive Patient Education  [employer].

## 2018-05-18 ENCOUNTER — Encounter (HOSPITAL_COMMUNITY)
Admission: RE | Admit: 2018-05-18 | Discharge: 2018-05-18 | Disposition: A | Discharge status: Home / Self Care | Payer: 59 | Source: Ambulatory Visit | Attending: Gastroenterology | Admitting: Gastroenterology

## 2018-05-18 ENCOUNTER — Encounter (HOSPITAL_COMMUNITY): Payer: Self-pay

## 2018-05-18 ENCOUNTER — Other Ambulatory Visit: Payer: Self-pay

## 2018-05-18 DIAGNOSIS — Z01812 Encounter for preprocedural laboratory examination: Secondary | ICD-10-CM | POA: Insufficient documentation

## 2018-05-18 LAB — BASIC METABOLIC PANEL
ANION GAP: 9 (ref 5–15)
BUN: 15 mg/dL (ref 6–20)
CALCIUM: 9.2 mg/dL (ref 8.9–10.3)
CO2: 29 mmol/L (ref 22–32)
Chloride: 103 mmol/L (ref 98–111)
Creatinine, Ser: 0.96 mg/dL (ref 0.44–1.00)
GLUCOSE: 113 mg/dL — AB (ref 70–99)
POTASSIUM: 3.9 mmol/L (ref 3.5–5.1)
Sodium: 141 mmol/L (ref 135–145)

## 2018-05-18 LAB — CBC
HEMATOCRIT: 36 % (ref 36.0–46.0)
Hemoglobin: 11.8 g/dL — ABNORMAL LOW (ref 12.0–15.0)
MCH: 30.2 pg (ref 26.0–34.0)
MCHC: 32.8 g/dL (ref 30.0–36.0)
MCV: 92.1 fL (ref 78.0–100.0)
Platelets: 238 10*3/uL (ref 150–400)
RBC: 3.91 MIL/uL (ref 3.87–5.11)
RDW: 14.1 % (ref 11.5–15.5)
WBC: 5.5 10*3/uL (ref 4.0–10.5)

## 2018-05-22 ENCOUNTER — Ambulatory Visit (HOSPITAL_COMMUNITY): Payer: 59 | Admitting: Anesthesiology

## 2018-05-22 ENCOUNTER — Encounter (HOSPITAL_COMMUNITY)
Admission: RE | Disposition: A | Discharge status: Home / Self Care | Payer: Self-pay | Source: Ambulatory Visit | Attending: Gastroenterology

## 2018-05-22 ENCOUNTER — Encounter (HOSPITAL_COMMUNITY): Payer: Self-pay | Admitting: *Deleted

## 2018-05-22 ENCOUNTER — Other Ambulatory Visit: Payer: Self-pay

## 2018-05-22 ENCOUNTER — Ambulatory Visit (HOSPITAL_COMMUNITY)
Admission: RE | Admit: 2018-05-22 | Discharge: 2018-05-22 | Disposition: A | Discharge status: Home / Self Care | Payer: 59 | Source: Ambulatory Visit | Attending: Gastroenterology | Admitting: Gastroenterology

## 2018-05-22 DIAGNOSIS — Z8673 Personal history of transient ischemic attack (TIA), and cerebral infarction without residual deficits: Secondary | ICD-10-CM | POA: Insufficient documentation

## 2018-05-22 DIAGNOSIS — Z7951 Long term (current) use of inhaled steroids: Secondary | ICD-10-CM | POA: Diagnosis not present

## 2018-05-22 DIAGNOSIS — F419 Anxiety disorder, unspecified: Secondary | ICD-10-CM | POA: Insufficient documentation

## 2018-05-22 DIAGNOSIS — J439 Emphysema, unspecified: Secondary | ICD-10-CM | POA: Diagnosis not present

## 2018-05-22 DIAGNOSIS — M797 Fibromyalgia: Secondary | ICD-10-CM | POA: Insufficient documentation

## 2018-05-22 DIAGNOSIS — R1013 Epigastric pain: Secondary | ICD-10-CM

## 2018-05-22 DIAGNOSIS — G35 Multiple sclerosis: Secondary | ICD-10-CM | POA: Insufficient documentation

## 2018-05-22 DIAGNOSIS — K2970 Gastritis, unspecified, without bleeding: Secondary | ICD-10-CM

## 2018-05-22 DIAGNOSIS — Z79899 Other long term (current) drug therapy: Secondary | ICD-10-CM | POA: Diagnosis not present

## 2018-05-22 DIAGNOSIS — Z9119 Patient's noncompliance with other medical treatment and regimen: Secondary | ICD-10-CM | POA: Diagnosis not present

## 2018-05-22 DIAGNOSIS — Z9049 Acquired absence of other specified parts of digestive tract: Secondary | ICD-10-CM | POA: Diagnosis not present

## 2018-05-22 DIAGNOSIS — G8929 Other chronic pain: Secondary | ICD-10-CM | POA: Diagnosis not present

## 2018-05-22 DIAGNOSIS — F329 Major depressive disorder, single episode, unspecified: Secondary | ICD-10-CM | POA: Diagnosis not present

## 2018-05-22 DIAGNOSIS — I110 Hypertensive heart disease with heart failure: Secondary | ICD-10-CM | POA: Diagnosis not present

## 2018-05-22 DIAGNOSIS — Z791 Long term (current) use of non-steroidal anti-inflammatories (NSAID): Secondary | ICD-10-CM | POA: Insufficient documentation

## 2018-05-22 DIAGNOSIS — K210 Gastro-esophageal reflux disease with esophagitis: Secondary | ICD-10-CM | POA: Insufficient documentation

## 2018-05-22 DIAGNOSIS — Z79890 Hormone replacement therapy (postmenopausal): Secondary | ICD-10-CM | POA: Diagnosis not present

## 2018-05-22 DIAGNOSIS — K254 Chronic or unspecified gastric ulcer with hemorrhage: Secondary | ICD-10-CM | POA: Diagnosis not present

## 2018-05-22 DIAGNOSIS — I509 Heart failure, unspecified: Secondary | ICD-10-CM | POA: Diagnosis not present

## 2018-05-22 DIAGNOSIS — G4730 Sleep apnea, unspecified: Secondary | ICD-10-CM | POA: Diagnosis not present

## 2018-05-22 DIAGNOSIS — E039 Hypothyroidism, unspecified: Secondary | ICD-10-CM | POA: Insufficient documentation

## 2018-05-22 DIAGNOSIS — M810 Age-related osteoporosis without current pathological fracture: Secondary | ICD-10-CM | POA: Diagnosis not present

## 2018-05-22 DIAGNOSIS — K449 Diaphragmatic hernia without obstruction or gangrene: Secondary | ICD-10-CM | POA: Insufficient documentation

## 2018-05-22 DIAGNOSIS — F17210 Nicotine dependence, cigarettes, uncomplicated: Secondary | ICD-10-CM | POA: Diagnosis not present

## 2018-05-22 DIAGNOSIS — E1140 Type 2 diabetes mellitus with diabetic neuropathy, unspecified: Secondary | ICD-10-CM | POA: Diagnosis not present

## 2018-05-22 DIAGNOSIS — K319 Disease of stomach and duodenum, unspecified: Secondary | ICD-10-CM | POA: Insufficient documentation

## 2018-05-22 DIAGNOSIS — R110 Nausea: Secondary | ICD-10-CM

## 2018-05-22 HISTORY — PX: ESOPHAGOGASTRODUODENOSCOPY (EGD) WITH PROPOFOL: SHX5813

## 2018-05-22 HISTORY — PX: BIOPSY: SHX5522

## 2018-05-22 LAB — GLUCOSE, CAPILLARY: GLUCOSE-CAPILLARY: 152 mg/dL — AB (ref 70–99)

## 2018-05-22 SURGERY — ESOPHAGOGASTRODUODENOSCOPY (EGD) WITH PROPOFOL
Anesthesia: General

## 2018-05-22 MED ORDER — FENTANYL CITRATE (PF) 100 MCG/2ML IJ SOLN
INTRAMUSCULAR | Status: AC
  Filled 2018-05-22: qty 2

## 2018-05-22 MED ORDER — PROPOFOL 500 MG/50ML IV EMUL
INTRAVENOUS | Status: DC | PRN
  Administered 2018-05-22: 125 ug/kg/min via INTRAVENOUS
  Administered 2018-05-22: 175 ug/kg/min via INTRAVENOUS

## 2018-05-22 MED ORDER — SODIUM CHLORIDE 0.9 % IJ SOLN
PREFILLED_SYRINGE | INTRAMUSCULAR | Status: DC | PRN
  Administered 2018-05-22: 3 mL

## 2018-05-22 MED ORDER — PROPOFOL 10 MG/ML IV BOLUS
INTRAVENOUS | Status: AC
  Filled 2018-05-22: qty 60

## 2018-05-22 MED ORDER — FENTANYL CITRATE (PF) 100 MCG/2ML IJ SOLN
50.0000 ug | Freq: Once | INTRAMUSCULAR | Status: AC
  Administered 2018-05-22: 50 ug via INTRAVENOUS

## 2018-05-22 MED ORDER — LIDOCAINE VISCOUS HCL 2 % MT SOLN
10.0000 mL | Freq: Once | OROMUCOSAL | Status: AC
  Administered 2018-05-22: 10 mL via OROMUCOSAL

## 2018-05-22 MED ORDER — ONDANSETRON HCL 4 MG/2ML IJ SOLN
4.0000 mg | Freq: Once | INTRAMUSCULAR | Status: AC
  Administered 2018-05-22: 4 mg via INTRAVENOUS

## 2018-05-22 MED ORDER — PANTOPRAZOLE SODIUM 40 MG PO TBEC: 40.0000 mg | DELAYED_RELEASE_TABLET | Freq: Two times a day (BID) | ORAL | 11 refills | Status: DC

## 2018-05-22 MED ORDER — LACTATED RINGERS IV SOLN
INTRAVENOUS | Status: DC
  Administered 2018-05-22: 09:00:00 via INTRAVENOUS

## 2018-05-22 MED ORDER — CHLORHEXIDINE GLUCONATE CLOTH 2 % EX PADS: 6.0000 | MEDICATED_PAD | Freq: Once | CUTANEOUS | Status: DC

## 2018-05-22 MED ORDER — EPINEPHRINE PF 1 MG/10ML IJ SOSY
PREFILLED_SYRINGE | INTRAMUSCULAR | Status: AC
  Filled 2018-05-22: qty 10

## 2018-05-22 MED ORDER — LIDOCAINE VISCOUS HCL 2 % MT SOLN
OROMUCOSAL | Status: AC
  Filled 2018-05-22: qty 15

## 2018-05-22 MED ORDER — PROPOFOL 10 MG/ML IV BOLUS
INTRAVENOUS | Status: DC | PRN
  Administered 2018-05-22: 20 mg via INTRAVENOUS
  Administered 2018-05-22: 40 mg via INTRAVENOUS
  Administered 2018-05-22: 20 mg via INTRAVENOUS

## 2018-05-22 MED ORDER — ONDANSETRON HCL 4 MG/2ML IJ SOLN
INTRAMUSCULAR | Status: AC
  Filled 2018-05-22: qty 2

## 2018-05-22 NOTE — Transfer of Care (Addendum)
Immediate Anesthesia Transfer of Care Note  Patient: [name]  Procedure(s) Performed: ESOPHAGOGASTRODUODENOSCOPY (EGD) WITH PROPOFOL (N/A ) BIOPSY  Patient Location: PACU  Anesthesia Type: General  Level of Consciousness: awake and patient cooperative  Airway & Oxygen Therapy: Patient Spontanous Breathing  Post-op Assessment: Report given to RN and Post -op Vital signs reviewed and stable  Post vital signs: Reviewed and stable  Last Vitals:  Vitals Value Taken Time  BP    Temp    Pulse 85 05/22/2018  9:35 AM  Resp 18 05/22/2018  9:35 AM  SpO2 96 % 05/22/2018  9:35 AM  Vitals shown include unvalidated device data.  Last Pain:  Vitals:   05/22/18 0907  TempSrc:   PainSc: 8       Patients Stated Pain Goal: 8 (05/22/18 0826)  Complications: No apparent anesthesia complications

## 2018-05-22 NOTE — Discharge Instructions (Signed)
You were bleeding from a stomach ulcer. I injected epinephrine and and applied cautery to stop it. The ulcer is due to goody powders and ibuprofen. You have ALSO HAVE EROSIVE gastritis DUE TO ASPIRIN AND ibuprofen. I biopsied your stomach.   DRINK WATER TO KEEP YOUR URINE LIGHT YELLOW.  AVOID REFLUX TRIGGERS. SEE INFO BELOW.  FOLLOW A LOW FAT DIET. MEATS SHOULD BE BAKED, BROILED, OR BOILED. AVOID FRIED FOODS. SEE INFO BELOW.  YOUR BIOPSY WILL BE BACK IN 7 DAYS.   FOLLOW UP IN 4 MOS.  UPPER ENDOSCOPY AFTER CARE Read the instructions outlined below and refer to this sheet in the next week. These discharge instructions provide you with general information on caring for yourself after you leave the hospital. While your treatment has been planned according to the most current medical practices available, unavoidable complications occasionally occur. If you have any problems or questions after discharge, call DR. Larin Depaoli, [phone].  ACTIVITY  You may resume your regular activity, but move at a slower pace for the next 24 hours.   Take frequent rest periods for the next 24 hours.   Walking will help get rid of the air and reduce the bloated feeling in your belly (abdomen).   No driving for 24 hours (because of the medicine (anesthesia) used during the test).   You may shower.   Do not sign any important legal documents or operate any machinery for 24 hours (because of the anesthesia used during the test).    NUTRITION  Drink plenty of fluids.   You may resume your normal diet as instructed by your doctor.   Begin with a light meal and progress to your normal diet. Heavy or fried foods are harder to digest and may make you feel sick to your stomach (nauseated).   Avoid alcoholic beverages for 24 hours or as instructed.    MEDICATIONS  You may resume your normal medications.   WHAT YOU CAN EXPECT TODAY  Some feelings of bloating in the abdomen.   Passage of more gas than  usual.    IF YOU HAD A BIOPSY TAKEN DURING THE UPPER ENDOSCOPY:  Eat a soft diet IF YOU HAVE NAUSEA, BLOATING, ABDOMINAL PAIN, OR VOMITING.    FINDING OUT THE RESULTS OF YOUR TEST Not all test results are available during your visit. DR. Darrick Penna WILL CALL YOU WITHIN 14 DAYS OF YOUR PROCEDUE WITH YOUR RESULTS. Do not assume everything is normal if you have not heard from DR. Izell Labat, CALL HER OFFICE AT [phone].  SEEK IMMEDIATE MEDICAL ATTENTION AND CALL THE OFFICE: [phone] IF:  You have more than a spotting of blood in your stool.   Your belly is swollen (abdominal distention).   You are nauseated or vomiting.   You have a temperature over 101F.   You have abdominal pain or discomfort that is severe or gets worse throughout the day.   Gastritis Gastritis is an inflammation (the body's way of reacting to injury and/or infection) of the stomach.  It is often caused by bacterial (germ) infections. It can also be caused BY ASPIRIN, BC/GOODY POWDER'S, (IBUPROFEN) MOTRIN, OR ALEVE (NAPROXEN), chemicals (including alcohol), SPICY FOODS, and medications. This illness may be associated with generalized malaise (feeling tired, not well), UPPER ABDOMINAL STOMACH cramps, and fever. One common bacterial cause of gastritis is an organism known as H. Pylori. This can be treated with antibiotics.     REFLUX   TREATMENT There are a number of  medicines used to treat  reflux including: Antacids.  ZANTAC OR PEPCID Proton-pump inhibitors: PROTONIX    Lifestyle and home remedies TO HELP CONTROL HEARTBURN.  You may eliminate or reduce the frequency of heartburn by making the following lifestyle changes:   Control your weight. Being overweight is a major risk factor for heartburn and GERD. Excess pounds put pressure on your abdomen, pushing up your stomach and causing acid to back up into your esophagus.    Eat smaller meals. 4 TO 6 MEALS A DAY. This reduces pressure on the lower  esophageal sphincter, helping to prevent the valve from opening and acid from washing back into your esophagus.    Loosen your belt. Clothes that fit tightly around your waist put pressure on your abdomen and the lower esophageal sphincter.     Eliminate heartburn triggers. Everyone has specific triggers.Common triggers such as fatty or fried foods, spicy food, tomato sauce, carbonated beverages, alcohol, chocolate, mint, garlic, onion, caffeine and nicotine may make heartburn worse.    Avoid stooping or bending. Tying your shoes is OK. Bending over for longer periods to weed your garden isn't, especially soon after eating.    Don't lie down after a meal. Wait at least three to four hours after eating before going to bed, and don't lie down right after eating.    PLACE THE HEAD OF YOUR BED ON 6 INCH BLOCKS.  Alternative medicine  Several home remedies exist for treating GERD, but they provide only temporary relief. They include drinking baking soda (sodium bicarbonate) added to water or drinking other fluids such as baking soda mixed with cream of tartar and water.  Although these liquids create temporary relief by neutralizing, washing away or buffering acids, eventually they aggravate the situation by adding gas and fluid to your stomach, increasing pressure and causing more acid reflux. Further, adding more sodium to your diet may increase your blood pressure and add stress to your heart, and excessive bicarbonate ingestion can alter the acid-base balance in your body.    Low-Fat Diet BREADS, CEREALS, PASTA, RICE, DRIED PEAS, AND BEANS These products are high in carbohydrates and most are low in fat. Therefore, they can be increased in the diet as substitutes for fatty foods. They too, however, contain calories and should not be eaten in excess. Cereals can be eaten for snacks as well as for breakfast.  Include foods that contain fiber (fruits, vegetables, whole grains, and legumes).  Research shows that fiber may lower blood cholesterol levels, especially the water-soluble fiber found in fruits, vegetables, oat products, and legumes. FRUITS AND VEGETABLES It is good to eat fruits and vegetables. Besides being sources of fiber, both are rich in vitamins and some minerals. They help you get the daily allowances of these nutrients. Fruits and vegetables can be used for snacks and desserts. MEATS Limit lean meat, chicken, [place], and fish to no more than 6 ounces per day. Beef, Pork, and Lamb Use lean cuts of beef, pork, and lamb. Lean cuts include:  Extra-lean ground beef.  Arm roast.  Sirloin tip.  Center-cut ham.  Round steak.  Loin chops.  Rump roast.  Tenderloin.  Trim all fat off the outside of meats before cooking. It is not necessary to severely decrease the intake of red meat, but lean choices should be made. Lean meat is rich in protein and contains a highly absorbable form of iron. Premenopausal women, in particular, should avoid reducing lean red meat because this could increase the risk for low red blood cells (  iron-deficiency anemia).  Chicken and [place] These are good sources of protein. The fat of poultry can be reduced by removing the skin and underlying fat layers before cooking. Chicken and [place] can be substituted for lean red meat in the diet. Poultry should not be fried or covered with high-fat sauces. Fish and Shellfish Fish is a good source of protein. Shellfish contain cholesterol, but they usually are low in saturated fatty acids. The preparation of fish is important. Like chicken and [place], they should not be fried or covered with high-fat sauces. EGGS Egg whites contain no fat or cholesterol. They can be eaten often. Try 1 to 2 egg whites instead of whole eggs in recipes or use egg substitutes that do not contain yolk.  MILK AND DAIRY PRODUCTS Use skim or 1% milk instead of 2% or whole milk. Decrease whole milk, natural, and processed cheeses.  Use nonfat or low-fat (2%) cottage cheese or low-fat cheeses made from vegetable oils. Choose nonfat or low-fat (1 to 2%) yogurt. Experiment with evaporated skim milk in recipes that call for heavy cream. Substitute low-fat yogurt or low-fat cottage cheese for sour cream in dips and salad dressings. Have at least 2 servings of low-fat dairy products, such as 2 glasses of skim (or 1%) milk each day to help get your daily calcium intake.  FATS AND OILS Butterfat, lard, and beef fats are high in saturated fat and cholesterol. These should be avoided.Vegetable fats do not contain cholesterol. AVOID coconut oil, palm oil, and palm kernel oil, WHICH are very high in saturated fats. These should be limited. These fats are often used in bakery goods, processed foods, popcorn, oils, and nondairy creamers. Vegetable shortenings and some peanut butters contain hydrogenated oils, which are also saturated fats. Read the labels on these foods and check for saturated vegetable oils.  Desirable liquid vegetable oils are corn oil, cottonseed oil, olive oil, canola oil, safflower oil, soybean oil, and sunflower oil. Peanut oil is not as good, but small amounts are acceptable. Buy a heart-healthy tub margarine that has no partially hydrogenated oils in the ingredients. AVOID Mayonnaise and salad dressings often are made from unsaturated fats.  OTHER EATING TIPS Snacks  Most sweets should be limited as snacks. They tend to be rich in calories and fats, and their caloric content outweighs their nutritional value. Some good choices in snacks are graham crackers, melba toast, soda crackers, bagels (no egg), English muffins, fruits, and vegetables. These snacks are preferable to snack crackers, [place] fries, and chips. Popcorn should be air-popped or cooked in small amounts of liquid vegetable oil.  Desserts Eat fruit, low-fat yogurt, and fruit ices instead of pastries, cake, and cookies. Sherbet, angel food cake, gelatin  dessert, frozen low-fat yogurt, or other frozen products that do not contain saturated fat (pure fruit juice bars, frozen ice pops) are also acceptable.   COOKING METHODS Choose those methods that use little or no fat. They include: Poaching.  Braising.  Steaming.  Grilling.  Baking.  Stir-frying.  Broiling.  Microwaving.  Foods can be cooked in a nonstick pan without added fat, or use a nonfat cooking spray in regular cookware. Limit fried foods and avoid frying in saturated fat. Add moisture to lean meats by using water, broth, cooking wines, and other nonfat or low-fat sauces along with the cooking methods mentioned above. Soups and stews should be chilled after cooking. The fat that forms on top after a few hours in the refrigerator should be skimmed off. When  preparing meals, avoid using excess salt. Salt can contribute to raising blood pressure in some people.  EATING AWAY FROM HOME Order entres, potatoes, and vegetables without sauces or butter. When meat exceeds the size of a deck of cards (3 to 4 ounces), the rest can be taken home for another meal. Choose vegetable or fruit salads and ask for low-calorie salad dressings to be served on the side. Use dressings sparingly. Limit high-fat toppings, such as bacon, crumbled eggs, cheese, sunflower seeds, and olives. Ask for heart-healthy tub margarine instead of butter.

## 2018-05-22 NOTE — Anesthesia Preprocedure Evaluation (Signed)
Anesthesia Evaluation  Patient identified by MRN, date of birth, ID band Patient awake    Reviewed: Allergy & Precautions, NPO status , Patient's Chart, lab work & pertinent test results  History of Anesthesia Complications (+) PONV  Airway Mallampati: II  TM Distance: >3 FB Neck ROM: Full    Dental no notable dental hx.    Pulmonary neg pulmonary ROS, shortness of breath, with exertion and at rest, asthma , sleep apnea , pneumonia, COPD,  COPD inhaler, Current Smoker,  States [place] with CPAP C/W smoking  States uses albuterol last ~3-5 days ago -states has STML- uncertain   Pulmonary exam normal breath sounds clear to auscultation       Cardiovascular Exercise Tolerance: Poor hypertension, +CHF  negative cardio ROS Normal cardiovascular examII Rhythm:Regular Rate:Normal     Neuro/Psych  Headaches, PSYCHIATRIC DISORDERS Anxiety Depression TIA Neuromuscular disease CVA, Residual Symptoms negative neurological ROS  negative psych ROS   GI/Hepatic negative GI ROS, Neg liver ROS, hiatal hernia, GERD  Medicated,States symptomatic    Endo/Other  negative endocrine ROSdiabetesPre DM- no meds - Glc 152 today  Renal/GU negative Renal ROS  negative genitourinary   Musculoskeletal negative musculoskeletal ROS (+) Arthritis , Osteoarthritis,  Fibromyalgia -  Abdominal   Peds negative pediatric ROS (+)  Hematology negative hematology ROS (+) anemia ,   Anesthesia Other Findings   Reproductive/Obstetrics negative OB ROS                             Anesthesia Physical Anesthesia Plan  ASA: III  Anesthesia Plan: General   Post-op Pain Management:    Induction:   PONV Risk Score and Plan:   Airway Management Planned: Nasal Cannula  Additional Equipment:   Intra-op Plan:   Post-operative Plan:   Informed Consent: I have reviewed the patients History and Physical, chart, labs and discussed  the procedure including the risks, benefits and alternatives for the proposed anesthesia with the patient or authorized representative who has indicated his/her understanding and acceptance.   Dental advisory given  Plan Discussed with:   Anesthesia Plan Comments:         Anesthesia Quick Evaluation

## 2018-05-22 NOTE — H&P (Signed)
Primary Care Physician:  Roger Kill, PA-C Primary Gastroenterologist:  Dr. Darrick Penna  Pre-Procedure History & Physical: HPI:  [name] is a 57 y.o. female here for DYSPEPSIA.  Past Medical History:  Diagnosis Date  . Anxiety   . Asthma    Albuterol prn   . CHF (congestive heart failure) (HCC)    takes Furosemide daily  . Chronic back pain    DDD/scoliosis  . Complication of anesthesia    hard to wake up  . COPD (chronic obstructive pulmonary disease) (HCC)   . DDD (degenerative disc disease)   . Depression    takes Celexa,Clonazepam daily  . Diabetes mellitus    borderline  . Diverticulitis   . Edema of lower extremity   . Emphysema lung (HCC)   . Fibromyalgia   . Gastritis   . GERD (gastroesophageal reflux disease)    takes Pepcid daily  . Gout   . Gout of big toe    RIGHT  . Hemorrhoids   . Hiatal hernia   . History of bronchitis    last time 4-37months ago   . History of colon polyps   . History of kidney stones    is in her left kidney and has been for couple of yrs and no problems  . HTN (hypertension)    takes lisinopril daily  . Hyperlipidemia    but doesn't take any meds  . Hypothyroidism    takes Synthroid daily  . Insomnia    doesn't take any meds for this  . Joint pain   . Joint swelling   . Migraine    last 04/04/18  . Migraine   . MS (multiple sclerosis) (HCC)    early stages  . Muscle spasm    takes Flexeril daily as needed  . Neuropathy   . Osteoporosis   . Phlebitis   . Pneumonia    hx of;last time in 2014  . PONV (postoperative nausea and vomiting)   . Shortness of breath    with exertion or stressed   . Sleep apnea    sleep study done at least 36yrs ago;has CPAP but doesn't use  . Stroke [facility])    left sided weakness  . TIA (transient ischemic attack)    Hx: of  . Urinary frequency     Past Surgical History:  Procedure Laterality Date  . ABDOMINAL HYSTERECTOMY    . ANTERIOR CERVICAL DECOMP/DISCECTOMY FUSION N/A  12/30/2013   Procedure: ANTERIOR CERVICAL DISCECTOMY FUSION C4-5, C5-6, plate and screws, allograft, local bone graft;  Surgeon: Kerrin Champagne, MD;  Location: [facility] OR;  Service: Orthopedics;  Laterality: N/A;  . BACK SURGERY     lumbar discectomy  . BIOPSY N/A 01/20/2015   Procedure: COLON BIOPSY;  Surgeon: West Bali, MD;  Location: [facility] ORS;  Service: Endoscopy;  Laterality: N/A;  . BLADDER SUSPENSION    . CARDIAC CATHETERIZATION    . CARPAL TUNNEL RELEASE Right   . CHOLECYSTECTOMY    . COLONOSCOPY    . COLONOSCOPY WITH PROPOFOL N/A 01/20/2015   SLF: 1. Normal ileum 2. Redundant left colon 3. Smalll internal hemorrhoids.   . ESOPHAGOGASTRODUODENOSCOPY  10/29/07   normal  . fundic gland polyp     benign  . POLYPECTOMY N/A 01/20/2015   Procedure: RECTAL POLYPECTOMY;  Surgeon: West Bali, MD;  Location: [facility] ORS;  Service: Endoscopy;  Laterality: N/A;  . SIGMOIDOSCOPY  01/31/08   large internal hemorrhoids/small rectal polyp removed/rare sigmoid diverticula  Prior to Admission medications   Medication Sig Start Date End Date Taking? Authorizing Provider  albuterol (PROVENTIL) (5 MG/ML) 0.5% nebulizer solution Take 1 mL (5 mg total) by nebulization every 6 (six) hours as needed for wheezing or shortness of breath. Patient taking differently: Take 5 mg by nebulization 2 (two) times daily.  08/26/17  Yes Idol, Raynelle Fanning, PA-C  amLODipine (NORVASC) 10 MG tablet Take 1 tablet (10 mg total) by mouth daily. 11/15/17  Yes Hagler, Fleet Contras, MD  Aspirin-Acetaminophen-Caffeine (GOODY HEADACHE PO) Take 1 Package by mouth daily as needed (headaches).    Yes [provider]  bismuth subsalicylate (PEPTO BISMOL) 262 MG chewable tablet Chew 524 mg by mouth as needed for indigestion or diarrhea or loose stools.   Yes [provider]  citalopram (CELEXA) 40 MG tablet Take 40 mg by mouth daily.   Yes [provider]  clonazePAM (KLONOPIN) 0.5 MG tablet Take 1 tablet (0.5 mg total) by  mouth 3 (three) times daily as needed for anxiety. 11/02/17  Yes Aliene Beams, MD  cyclobenzaprine (FLEXERIL) 10 MG tablet Take 1 tablet (10 mg total) by mouth 3 (three) times daily as needed for muscle spasms. 02/16/18  Yes Hagler, Fleet Contras, MD  fluticasone furoate-vilanterol (BREO ELLIPTA) 200-25 MCG/INH AEPB Inhale 1 puff into the lungs daily. 02/16/18  Yes Hagler, Fleet Contras, MD  furosemide (LASIX) 20 MG tablet Take 0.5 tablets (10 mg total) by mouth daily. Patient taking differently: Take 20 mg by mouth daily as needed for fluid or edema.  11/15/17  Yes Hagler, Fleet Contras, MD  ibuprofen (ADVIL,MOTRIN) 800 MG tablet Take 800 mg by mouth 3 (three) times daily. 12/15/15  Yes [provider]  levothyroxine (SYNTHROID, LEVOTHROID) 150 MCG tablet Take 1 tablet (150 mcg total) by mouth daily. 04/05/18  Yes Bryonna Sundby L, MD  lisinopril (PRINIVIL,ZESTRIL) 40 MG tablet Take 1 tablet (40 mg total) by mouth daily. 02/16/18  Yes Hagler, Fleet Contras, MD  oxymetazoline (AFRIN) 0.05 % nasal spray Place 1 spray into both nostrils daily as needed for congestion.   Yes [provider]  promethazine (PHENERGAN) 25 MG tablet Take 25 mg by mouth 3 (three) times daily. 12/15/15  Yes [provider]  SUBOXONE 8-2 MG FILM Place 1 Film under the tongue 3 (three) times daily.  12/29/15  Yes [provider]  traZODone (DESYREL) 150 MG tablet Take 150 mg by mouth at bedtime.  12/15/15  Yes [provider]  famotidine (PEPCID) 20 MG tablet Take 2 tablets (40 mg total) by mouth daily. 04/08/15   Black, Lesle Chris, NP  pantoprazole (PROTONIX) 40 MG tablet Take 1 tablet (40 mg total) by mouth 2 (two) times daily before a meal. 02/05/18   Anice Paganini, NP  tiotropium (SPIRIVA HANDIHALER) 18 MCG inhalation capsule Place 1 capsule (18 mcg total) daily into inhaler and inhale. 09/11/17   Aliene Beams, MD    Allergies as of 02/05/2018 - Review Complete 02/05/2018  Allergen Reaction Noted  . Demerol [meperidine]  Anaphylaxis and Swelling 08/06/2012  . Imitrex [sumatriptan] Anaphylaxis 04/05/2015  . Penicillins Anaphylaxis and Swelling 08/06/2012  . Sulfa antibiotics Anaphylaxis 08/06/2012  . Abilify [aripiprazole] Nausea And Vomiting 11/23/2017  . Levaquin [levofloxacin in d5w]  04/10/2014  . Neurontin [gabapentin] Other (See Comments) 11/04/2013  . Toradol [ketorolac tromethamine] Other (See Comments) 11/15/2014  . Tramadol Other (See Comments) 01/08/2015  . Doxycycline Nausea And Vomiting 09/21/2012    Family History  Problem Relation Age of Onset  . Diabetes Mother   .  Hypertension Mother   . Arthritis Father   . Diabetes Sister   . Neuropathy Sister   . Transient ischemic attack Sister   . Heart disease Sister   . Hypertension Sister   . Cancer - Lung Other   . Cancer - Prostate Other   . Cancer - Other Other   . Colon cancer Paternal Uncle   . Colon cancer Brother        deceased age 71 from colon CA and brain CA    Social History   Socioeconomic History  . Marital status: Married    Spouse name: Not on file  . Number of children: Not on file  . Years of education: Not on file  . Highest education level: Not on file  Occupational History  . Not on file  Social Needs  . Financial resource strain: Not on file  . Food insecurity:    Worry: Not on file    Inability: Not on file  . Transportation needs:    Medical: Not on file    Non-medical: Not on file  Tobacco Use  . Smoking status: Current Some Day Smoker    Packs/day: 0.75    Years: 15.00    Pack years: 11.25    Types: Cigarettes  . Smokeless tobacco: Never Used  Substance and Sexual Activity  . Alcohol use: No  . Drug use: No  . Sexual activity: Yes    Birth control/protection: Surgical  Lifestyle  . Physical activity:    Days per week: Not on file    Minutes per session: Not on file  . Stress: Not on file  Relationships  . Social connections:    Talks on phone: More than three times a week    Gets  together: More than three times a week    Attends religious service: More than 4 times per year    Active member of club or organization: No    Attends meetings of clubs or organizations: Never    Relationship status: Widowed  . Intimate partner violence:    Fear of current or ex partner: No    Emotionally abused: No    Physically abused: No    Forced sexual activity: No  Other Topics Concern  . Not on file  Social History Narrative   Patient is married and has children.  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  Denies any alcohol or other drug use.  Reports that 1 of her children is also dealing with narcotic addiction and also goes to the Suboxone clinic with her.   Eats meat fruits and vegetables.   Currently smokes.   Not interested in quitting smoking at this time.   Wears seatbelt.    Review of Systems: See HPI, otherwise negative ROS   Physical Exam: BP 125/68   Pulse 91   Temp 98.4 F (36.9 C) (Oral)   Resp 20   Ht 5\' 4"  (1.626 m)   Wt 141 lb (64 kg)   SpO2 97%   BMI 24.20 kg/m  General:   Alert,  pleasant and cooperative in NAD Head:  Normocephalic and atraumatic. Neck:  Supple; Lungs:  Clear throughout to auscultation.    Heart:  Regular rate and rhythm. Abdomen:  Soft, nontender and nondistended. Normal bowel sounds, without guarding, and without rebound.   Neurologic:  Alert and  oriented x4;  grossly normal neurologically.  Impression/Plan:     DYSPEPSIA  PLAN:  EGD TODAY  DISCUSSED PROCEDURE, BENEFITS, & RISKS: < 1% chance of medication reaction, bleeding, OR perforation.

## 2018-05-22 NOTE — Anesthesia Postprocedure Evaluation (Signed)
Anesthesia Post Note  Patient: [name]  Procedure(s) Performed: ESOPHAGOGASTRODUODENOSCOPY (EGD) WITH PROPOFOL (N/A ) BIOPSY  Patient location during evaluation: PACU Anesthesia Type: General Level of consciousness: awake and alert and patient cooperative Pain management: satisfactory to patient Vital Signs Assessment: post-procedure vital signs reviewed and stable Respiratory status: spontaneous breathing Cardiovascular status: stable : patient states nausea is a little better after medications. Anesthetic complications: no     Last Vitals:  Vitals:   05/22/18 0948 05/22/18 1012  BP: (!) 160/67 (!) 178/79  Pulse: 86 89  Resp: (!) 27   Temp:  36.6 C  SpO2: 94% 93%    Last Pain:  Vitals:   05/22/18 1012  TempSrc: Oral  PainSc: 8                  [name]

## 2018-05-22 NOTE — Op Note (Signed)
[facility] Patient Name: [name] Procedure Date: 05/22/2018 8:36 AM MRN: [id number] Date of Birth: 11/12/1960 Attending MD: Jonette Eva MD, MD [name]: [id number] Age: 57 Admit Type: Outpatient Procedure:                Upper GI endoscopy-SQ INJECTION/CONTROL                            BLEEDING/COLD BIOPSY Indications:              Epigastric abdominal pain, Dyspepsia WHILE TAKING                            GOODY POWDERS AND IBUPROFEN WITHOUT A PPI. HAS GERD                            BUT SMOKES AT LEAST 1 PACK CIGS/DAY. HAS HISTORY OF                            MEDICAL NON-ADHERENCE. Providers:                Jonette Eva MD, MD, Criselda Peaches. Patsy Lager, RN, Dyann Ruddle Referring MD:             Karis Juba. Williams Medicines:                Propofol per Anesthesia Complications:            No immediate complications. Estimated Blood Loss:     Estimated blood loss was minimal. Procedure:                Pre-Anesthesia Assessment:                           - Prior to the procedure, a History and Physical                            was performed, and patient medications and                            allergies were reviewed. The patient's tolerance of                            previous anesthesia was also reviewed. The risks                            and benefits of the procedure and the sedation                            options and risks were discussed with the patient.                            All questions were answered, and informed consent  was obtained. Prior Anticoagulants: The patient                            last took aspirin 1 day and ibuprofen 7 days prior                            to the procedure. ASA Grade Assessment: II - A                            patient with mild systemic disease. After reviewing                            the risks and benefits, the patient was deemed in   satisfactory condition to undergo the procedure.                           After obtaining informed consent, the endoscope was                            passed under direct vision. Throughout the                            procedure, the patient's blood pressure, pulse, and                            oxygen saturations were monitored continuously. The                            GIF-H190 ([id number]) scope was introduced through the                            mouth, and advanced to the second part of duodenum.                            The upper GI endoscopy was accomplished without                            difficulty. The patient tolerated the procedure                            well. Scope In: 9:12:47 AM Scope Out: 9:26:06 AM Total Procedure Duration: 0 hours 13 minutes 19 seconds  Findings:      A small hiatal hernia was present.      Diffuse severe inflammation characterized by congestion (edema),       erosions and erythema was found on the greater curvature of the stomach       and in the entire examined stomach. Biopsies were taken with a cold       forceps for Helicobacter pylori testing.      One oozing linear gastric ulcer with no visible vessel was found at the       pylorus PRIOR TO INSERTING SCOPE. Area was successfully injected with 3       mL of a 1:10,000 solution of epinephrine for hemostasis. Coagulation for  hemostasis using bipolar probe(25W) was successful.      The examined duodenum was normal.      MILD DISTAL Esophagitis with no bleeding was found. Impression:               - EPIGASTRIC PAIN DUE TO ESOPHAGITIS, GERD,                            GASTRITIS, PUD                           - Small hiatal hernia. Moderate Sedation:      Per Anesthesia Care Recommendation:           - High fiber diet and low fat diet. AVOID REFLUX                            TRIGGERS.                           - Continue present medications. RESUME PROTONIX BID                             FOR 3 MSO THEN ONCE DAILY.                           - Await pathology results.                           - Return to my office in 4 months.                           - Patient has a contact number available for                            emergencies. The signs and symptoms of potential                            delayed complications were discussed with the                            patient. Return to normal activities tomorrow.                            Written discharge instructions were provided to the                            patient. Procedure Code(s):        --- Professional ---                           43255, 57, Esophagogastroduodenoscopy, flexible,                            transoral; with control of bleeding, any method                           43239, Esophagogastroduodenoscopy,  flexible,                            transoral; with biopsy, single or multiple Diagnosis Code(s):        --- Professional ---                           K44.9, Diaphragmatic hernia without obstruction or                            gangrene                           K29.70, Gastritis, unspecified, without bleeding                           K25.4, Chronic or unspecified gastric ulcer with                            hemorrhage                           R10.13, Epigastric pain CPT copyright 2017 [employer]. All rights reserved. The codes documented in this report are preliminary and upon coder review may  be revised to meet current compliance requirements. Jonette Eva, MD Jonette Eva MD, MD 05/22/2018 9:46:05 AM This report has been signed electronically. Number of Addenda: 0

## 2018-05-22 NOTE — Anesthesia Procedure Notes (Signed)
Procedure Name: MAC Date/Time: 05/22/2018 9:04 AM Performed by: Vista Deck, CRNA Pre-anesthesia Checklist: Patient identified, Emergency Drugs available, Suction available, Timeout performed and Patient being monitored Patient Re-evaluated:Patient Re-evaluated prior to induction Oxygen Delivery Method: Non-rebreather mask

## 2018-05-24 NOTE — Progress Notes (Signed)
LMOM to call.

## 2018-05-25 ENCOUNTER — Encounter (HOSPITAL_COMMUNITY): Payer: Self-pay | Admitting: Gastroenterology

## 2018-05-25 NOTE — Progress Notes (Signed)
PT is aware.

## 2018-05-28 NOTE — Progress Notes (Signed)
CC'D TO PCP °

## 2018-06-08 ENCOUNTER — Telehealth: Payer: Self-pay

## 2018-06-08 NOTE — Telephone Encounter (Signed)
SENT REFERRAL TO SCHEDULING AND FILED NOTES 

## 2018-06-12 MED FILL — SUBOXONE 8 MG-2 MG SL FILM: 8-2 | 21 days supply | Qty: 63 | Fill #0

## 2018-06-20 ENCOUNTER — Other Ambulatory Visit (HOSPITAL_COMMUNITY): Payer: Self-pay

## 2018-06-29 ENCOUNTER — Ambulatory Visit: Payer: 59 | Admitting: Cardiovascular Disease

## 2018-07-03 MED FILL — BUPRENORPHINE HCL-NALOXONE: 8-2 | 14 days supply | Qty: 42 | Fill #0

## 2018-07-04 ENCOUNTER — Ambulatory Visit: Payer: 59 | Admitting: Cardiovascular Disease

## 2018-07-17 MED FILL — SUBOXONE 8 MG-2 MG SL FILM: 8-2 | 14 days supply | Qty: 42 | Fill #0

## 2018-07-31 MED FILL — HYDROXYZINE PAM 25 MG CAP: 25 | 30 days supply | Qty: 60 | Fill #0

## 2018-07-31 MED FILL — MOVANTIK 25 MG TABLET: 25 | 30 days supply | Qty: 30 | Fill #0

## 2018-07-31 MED FILL — SUBOXONE 8 MG-2 MG SL FILM: 8-2 | 28 days supply | Qty: 84 | Fill #0

## 2018-08-08 ENCOUNTER — Ambulatory Visit: Payer: 59 | Admitting: Cardiovascular Disease

## 2018-08-08 ENCOUNTER — Encounter

## 2018-08-09 ENCOUNTER — Encounter: Payer: Self-pay | Admitting: *Deleted

## 2018-08-10 ENCOUNTER — Other Ambulatory Visit: Payer: Self-pay | Admitting: Family Medicine

## 2018-08-16 ENCOUNTER — Ambulatory Visit: Payer: Self-pay | Admitting: Nurse Practitioner

## 2018-08-28 MED FILL — PROMETHAZINE 25 MG TABLET: 25 | 30 days supply | Qty: 90 | Fill #0

## 2018-08-28 MED FILL — SUBOXONE 8 MG-2 MG SL FILM: 8-2 | 14 days supply | Qty: 42 | Fill #0

## 2018-08-30 NOTE — Progress Notes (Signed)
REVIEWED-NO ADDITIONAL RECOMMENDATIONS. 

## 2018-09-13 ENCOUNTER — Encounter: Payer: Self-pay | Admitting: Gastroenterology

## 2018-09-13 ENCOUNTER — Telehealth: Payer: Self-pay | Admitting: Gastroenterology

## 2018-09-13 ENCOUNTER — Ambulatory Visit: Payer: 59 | Admitting: Gastroenterology

## 2018-09-13 NOTE — Progress Notes (Deleted)
   Subjective:    Patient ID: [name], female    DOB: 06-12-1961, 57 y.o.   MRN: [id number]  HPI    Review of Systems     Objective:   Physical Exam        Assessment & Plan:

## 2018-09-13 NOTE — Telephone Encounter (Signed)
Patient was a no show and letter sent  °

## 2018-09-27 MED FILL — SUBOXONE 8 MG-2 MG SL FILM: 8-2 | 12 days supply | Qty: 36 | Fill #0

## 2018-09-27 MED FILL — PROMETHAZINE 25 MG TABLET: 25 | 30 days supply | Qty: 90 | Fill #0

## 2018-10-09 MED FILL — SUBOXONE 8 MG-2 MG SL FILM: 8-2 | 30 days supply | Qty: 90 | Fill #0

## 2018-10-09 MED FILL — clonazePAM 0.5 MG TABS: 0.5 | 30 days supply | Qty: 90 | Fill #0

## 2018-11-26 ENCOUNTER — Other Ambulatory Visit: Payer: Self-pay | Admitting: Family Medicine

## 2018-11-26 DIAGNOSIS — I509 Heart failure, unspecified: Secondary | ICD-10-CM

## 2018-11-27 MED FILL — PROMETHAZINE 25 MG TABLET: 25 | 3 days supply | Qty: 90 | Fill #0

## 2018-11-27 MED FILL — SUBOXONE 8 MG-2 MG SL FILM: 8-2 | 14 days supply | Qty: 42 | Fill #0

## 2018-11-27 MED FILL — clonazePAM 0.5 MG TABS: 0.5 | 15 days supply | Qty: 45 | Fill #0

## 2018-12-09 ENCOUNTER — Other Ambulatory Visit: Payer: Self-pay

## 2018-12-09 ENCOUNTER — Emergency Department (HOSPITAL_COMMUNITY)
Admission: EM | Admit: 2018-12-09 | Discharge: 2018-12-09 | Disposition: A | Discharge status: Home / Self Care | Payer: 59 | Attending: Emergency Medicine | Admitting: Emergency Medicine

## 2018-12-09 ENCOUNTER — Emergency Department (HOSPITAL_COMMUNITY): Payer: 59

## 2018-12-09 ENCOUNTER — Encounter (HOSPITAL_COMMUNITY): Payer: Self-pay | Admitting: Emergency Medicine

## 2018-12-09 DIAGNOSIS — Z8673 Personal history of transient ischemic attack (TIA), and cerebral infarction without residual deficits: Secondary | ICD-10-CM | POA: Insufficient documentation

## 2018-12-09 DIAGNOSIS — F17210 Nicotine dependence, cigarettes, uncomplicated: Secondary | ICD-10-CM | POA: Diagnosis not present

## 2018-12-09 DIAGNOSIS — M79601 Pain in right arm: Secondary | ICD-10-CM

## 2018-12-09 DIAGNOSIS — E119 Type 2 diabetes mellitus without complications: Secondary | ICD-10-CM | POA: Diagnosis not present

## 2018-12-09 DIAGNOSIS — I10 Essential (primary) hypertension: Secondary | ICD-10-CM | POA: Insufficient documentation

## 2018-12-09 DIAGNOSIS — M25531 Pain in right wrist: Secondary | ICD-10-CM | POA: Diagnosis not present

## 2018-12-09 DIAGNOSIS — J449 Chronic obstructive pulmonary disease, unspecified: Secondary | ICD-10-CM | POA: Diagnosis not present

## 2018-12-09 NOTE — Discharge Instructions (Signed)

## 2018-12-09 NOTE — ED Triage Notes (Addendum)
Pt fell on her right side Wednesday and now having right arm pain and right wrist pain. Pt did hit her head. Denies any lightheadedness, dizziness or vision changes

## 2018-12-09 NOTE — ED Provider Notes (Signed)
Emergency Department Provider Note   I have reviewed the triage vital signs and the nursing notes.   HISTORY  Chief Complaint Arm Pain and Wrist Pain   HPI [name] is a 58 y.o. female with PMH of asthma, CHD, chronic back pain, and HLD presents to the emergency department for evaluation of right arm pain.  Patient describes most of her pain in the right wrist but symptoms seem to be shooting into the upper arm at times.  Denies any neck pain.  1 week ago the patient had a mechanical fall where she got tripped up and fell.  She did strike her head during that encounter but has not had lingering headache symptoms.  No neck pain.  No fevers or chills.  No joint redness.  Patient does take Suboxone and clonazepam regularly.   Past Medical History:  Diagnosis Date  . Anxiety   . Asthma    Albuterol prn   . CHF (congestive heart failure) (HCC)    takes Furosemide daily  . Chronic back pain    DDD/scoliosis  . Complication of anesthesia    hard to wake up  . COPD (chronic obstructive pulmonary disease) (HCC)   . DDD (degenerative disc disease)   . Depression    takes Celexa,Clonazepam daily  . Diabetes mellitus    borderline  . Diverticulitis   . Edema of lower extremity   . Emphysema lung (HCC)   . Fibromyalgia   . Gastritis   . GERD (gastroesophageal reflux disease)    takes Pepcid daily  . Gout   . Gout of big toe    RIGHT  . Hemorrhoids   . Hiatal hernia   . History of bronchitis    last time 4-108months ago   . History of colon polyps   . History of kidney stones    is in her left kidney and has been for couple of yrs and no problems  . HTN (hypertension)    takes lisinopril daily  . Hyperlipidemia    but doesn't take any meds  . Hypothyroidism    takes Synthroid daily  . Insomnia    doesn't take any meds for this  . Joint pain   . Joint swelling   . Migraine    last 04/04/18  . Migraine   . MS (multiple sclerosis) (HCC)    early stages  . Muscle  spasm    takes Flexeril daily as needed  . Neuropathy   . Osteoporosis   . Phlebitis   . Pneumonia    hx of;last time in 2014  . PONV (postoperative nausea and vomiting)   . Shortness of breath    with exertion or stressed   . Sleep apnea    sleep study done at least 89yrs ago;has CPAP but doesn't use  . Stroke [facility])    left sided weakness  . TIA (transient ischemic attack)    Hx: of  . Urinary frequency     Patient Active Problem List   Diagnosis Date Noted  . Abdominal pain, chronic, epigastric 02/05/2018  . Nausea with vomiting 02/05/2018  . Fibromyalgia 11/02/2017  . Feeling of chest tightness 04/12/2015  . SOB (shortness of breath) 04/12/2015  . Hyperglycemia 04/08/2015  . Tobacco use disorder 04/08/2015  . Headache   . Left-sided weakness   . Hyperlipidemia 04/06/2015  . Migraine 04/05/2015  . Acute CVA (cerebrovascular accident) (HCC) 04/05/2015  . Acute blood loss anemia   . Hematochezia 11/15/2014  .  Bilateral lower abdominal cramping 11/15/2014  . Rectal bleeding 11/15/2014  . Colitis, acute 11/15/2014  . Malnutrition of moderate degree (HCC) 04/17/2014  . Postoperative anemia due to acute blood loss 04/17/2014    Class: Acute  . Malnourished (HCC) 04/17/2014    Class: Present on Admission  . 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 Spondylolisthesis at L4-L5 level 04/14/2014  . Spondylosis of cervical spine 12/30/2013  . Cervical spondylosis without myelopathy 12/30/2013    Class: Chronic  . Spondylolisthesis of lumbar region 12/30/2013    Class: Chronic  . CAP (community acquired pneumonia) 08/24/2013  . GERD (gastroesophageal reflux disease) 08/24/2013  . COPD (chronic obstructive pulmonary disease) (HCC) 08/24/2013  . Chronic pain syndrome 08/24/2013  . Cigarette smoker 08/24/2013  . Chest pain 08/24/2013  . COPD exacerbation (HCC) 08/11/2012  . DM (diabetes mellitus) (HCC) 08/11/2012  .  Leucocytosis 08/11/2012  . Hypertension 08/11/2012    Past Surgical History:  Procedure Laterality Date  . ABDOMINAL HYSTERECTOMY    . ANTERIOR CERVICAL DECOMP/DISCECTOMY FUSION N/A 12/30/2013   Procedure: ANTERIOR CERVICAL DISCECTOMY FUSION C4-5, C5-6, plate and screws, allograft, local bone graft;  Surgeon: Kerrin Champagne, MD;  Location: [facility] OR;  Service: Orthopedics;  Laterality: N/A;  . BACK SURGERY     lumbar discectomy  . BIOPSY N/A 01/20/2015   Procedure: COLON BIOPSY;  Surgeon: West Bali, MD;  Location: [facility] ORS;  Service: Endoscopy;  Laterality: N/A;  . BIOPSY  05/22/2018   Procedure: BIOPSY;  Surgeon: West Bali, MD;  Location: [facility] ENDO SUITE;  Service: Endoscopy;;  gastric  . BLADDER SUSPENSION    . CARDIAC CATHETERIZATION    . CARPAL TUNNEL RELEASE Right   . CHOLECYSTECTOMY    . COLONOSCOPY    . COLONOSCOPY WITH PROPOFOL N/A 01/20/2015   SLF: 1. Normal ileum 2. Redundant left colon 3. Smalll internal hemorrhoids.   . ESOPHAGOGASTRODUODENOSCOPY  10/29/07   normal  . ESOPHAGOGASTRODUODENOSCOPY (EGD) WITH PROPOFOL N/A 05/22/2018   Procedure: ESOPHAGOGASTRODUODENOSCOPY (EGD) WITH PROPOFOL;  Surgeon: West Bali, MD;  Location: [facility] ENDO SUITE;  Service: Endoscopy;  Laterality: N/A;  9:30am  . fundic gland polyp     benign  . POLYPECTOMY N/A 01/20/2015   Procedure: RECTAL POLYPECTOMY;  Surgeon: West Bali, MD;  Location: [facility] ORS;  Service: Endoscopy;  Laterality: N/A;  . SIGMOIDOSCOPY  01/31/08   large internal hemorrhoids/small rectal polyp removed/rare sigmoid diverticula   Allergies Demerol [meperidine]; Imitrex [sumatriptan]; Penicillins; Sulfa antibiotics; Abilify [aripiprazole]; Levaquin [levofloxacin in d5w]; Neurontin [gabapentin]; Toradol [ketorolac tromethamine]; Tramadol; Ceclor [cefaclor]; and Doxycycline  Family History  Problem Relation Age of Onset  . Diabetes Mother   . Hypertension Mother   . Arthritis Father   . Diabetes Sister   . Neuropathy  Sister   . Transient ischemic attack Sister   . Heart disease Sister   . Hypertension Sister   . Cancer - Lung Other   . Cancer - Prostate Other   . Cancer - Other Other   . Colon cancer Paternal Uncle   . Colon cancer Brother        deceased age 60 from colon CA and brain CA    Social History Social History   Tobacco Use  . Smoking status: Current Some Day Smoker    Packs/day: 0.75    Years: 15.00    Pack years: 11.25    Types: Cigarettes  . Smokeless tobacco: Never Used  Substance Use Topics  . Alcohol use: No  . Drug use: No    Review of Systems  Constitutional: No fever/chills Eyes: No visual changes. ENT: No sore throat. Cardiovascular: Denies chest pain. Respiratory: Denies shortness of breath. Gastrointestinal: No abdominal pain.  No nausea, no vomiting.  No diarrhea.  No constipation. Genitourinary: Negative for dysuria. Musculoskeletal: Negative for back pain. Positive right arm pain.  Skin: Negative for rash. Neurological: Negative for headaches, focal weakness or numbness.  10-point ROS otherwise negative.  ____________________________________________   PHYSICAL EXAM:  VITAL SIGNS: ED Triage Vitals  Enc Vitals Group     BP 12/09/18 1827 (!) 156/84     Pulse Rate 12/09/18 1827 74     Resp 12/09/18 1827 18     Temp 12/09/18 1827 98.1 F (36.7 C)     Temp Source 12/09/18 1827 Oral     SpO2 12/09/18 1827 98 %     Weight 12/09/18 1828 142 lb (64.4 kg)     Height 12/09/18 1828 5\' 5"  (1.651 m)     Pain Score 12/09/18 1827 8   Constitutional: Alert and oriented. Well appearing and in no acute distress. Eyes: Conjunctivae are normal. Head: Atraumatic. Nose: No congestion/rhinnorhea. Mouth/Throat: Mucous membranes are moist.  Neck: No stridor. No cervical spine tenderness to palpation. Cardiovascular: Normal rate, regular rhythm.  Respiratory: Normal respiratory effort.  Gastrointestinal: No distention.  Musculoskeletal: No lower extremity  tenderness nor edema. No gross deformities of extremities. Normal ROM of the shoulder, elbow, and wrist. Some discomfort with ROM of the wrist. No erythema or warmth. No tenderness to palpation of the right hand.  Neurologic:  Normal speech and language. No gross focal neurologic deficits are appreciated.  Skin:  Skin is warm, dry and intact. No rash noted.  ____________________________________________  RADIOLOGY  Dg Shoulder Right  Result Date: 12/09/2018 CLINICAL DATA:  Status post fall.  Right shoulder pain. EXAM: RIGHT SHOULDER - 2+ VIEW COMPARISON:  None. FINDINGS: There is no fracture or dislocation. The glenohumeral joint is normal. There are mild degenerative changes of the acromioclavicular joint. IMPRESSION: No acute osseous injury of the right shoulder. Electronically Signed   By: Elige Ko   On: 12/09/2018 19:42   Dg Wrist Complete Right  Result Date: 12/09/2018 CLINICAL DATA:  Status post fall, right wrist pain EXAM: RIGHT WRIST - COMPLETE 3+ VIEW COMPARISON:  None. FINDINGS: No acute fracture or dislocation. No aggressive osseous lesion. No periosteal reaction or bone destruction. No erosive changes. Mild osteoarthritis of the first [facility] joint and first IP joint. Soft tissues are normal. IMPRESSION: No acute osseous injury of the right wrist. Electronically Signed   By: Elige Ko   On: 12/09/2018 19:44   Dg Humerus Right  Result Date: 12/09/2018 CLINICAL DATA:  Status post fall, right arm pain EXAM: RIGHT HUMERUS - 2+ VIEW COMPARISON:  None. FINDINGS: There is no evidence of fracture or other focal bone lesions. There is mild arthropathy of the acromioclavicular joint. There are mild enthesopathic changes at the common flexor tendon origin. The soft tissues are normal. IMPRESSION: No acute osseous injury of the right humerus. Electronically Signed   By: Elige Ko   On: 12/09/2018 19:43    ____________________________________________   PROCEDURES  Procedure(s) performed:     Procedures  None ____________________________________________   INITIAL IMPRESSION / ASSESSMENT AND PLAN / ED COURSE  Pertinent labs & imaging results that were available during my care of the patient were reviewed by me and considered  in my medical decision making (see chart for details).  Patient presents to the emergency department with right arm pain after mechanical fall 1 week ago.  No bony abnormality.  Arm is neurovascularly intact.  No evidence to suspect septic arthritis.  Plan for imaging and reassess.  08:00 PM Patient with no acute findings on x-ray.  Will provide a Velcro wrist splint for comfort but advised removing the splint every 1-2 hours for range of motion exercises with the wrist which I demonstrated at bedside.  Patient is on Suboxone and plans to take Motrin as needed for pain.  Provided contact information for orthopedics if pain persists for more than 2 to 3 weeks. No concern for cervical spine etiology or CVA given mechanical fall.  ____________________________________________  FINAL CLINICAL IMPRESSION(S) / ED DIAGNOSES  Final diagnoses:  Right wrist pain  Right arm pain    Note:  This document was prepared using Dragon voice recognition software and may include unintentional dictation errors.  Alona BeneJoshua Jennett Tarbell, MD Emergency Medicine    Maryella Abood, Arlyss RepressJoshua G, MD 12/09/18 2006

## 2018-12-11 MED FILL — BUPRENORPHINE HCL-NALOXONE: 8-2 | 14 days supply | Qty: 42 | Fill #0

## 2018-12-11 MED FILL — clonazePAM 0.5 MG TABS: 0.5 | 15 days supply | Qty: 45 | Fill #0

## 2018-12-17 ENCOUNTER — Other Ambulatory Visit: Payer: Self-pay | Admitting: Family Medicine

## 2018-12-17 DIAGNOSIS — I509 Heart failure, unspecified: Secondary | ICD-10-CM

## 2018-12-25 MED FILL — CloNIDine HCL 0.1 MG TAB: 0.1 | 7 days supply | Qty: 14 | Fill #0

## 2019-01-01 MED FILL — SUBOXONE 8 MG-2 MG SL FILM: 8-2 | 28 days supply | Qty: 84 | Fill #0

## 2019-01-01 MED FILL — clonazePAM 0.5 MG TABS: 0.5 | 30 days supply | Qty: 90 | Fill #0

## 2019-01-29 MED FILL — PROMETHAZINE 25 MG TABLET: 25 | 30 days supply | Qty: 90 | Fill #0

## 2019-01-29 MED FILL — SUBOXONE 8 MG-2 MG SL FILM: 8-2 | 28 days supply | Qty: 84 | Fill #0

## 2019-02-10 ENCOUNTER — Other Ambulatory Visit: Payer: Self-pay

## 2019-02-10 ENCOUNTER — Encounter (HOSPITAL_COMMUNITY): Payer: Self-pay | Admitting: Emergency Medicine

## 2019-02-10 ENCOUNTER — Emergency Department (HOSPITAL_COMMUNITY): Payer: 59

## 2019-02-10 ENCOUNTER — Emergency Department (HOSPITAL_COMMUNITY)
Admission: EM | Admit: 2019-02-10 | Discharge: 2019-02-11 | Disposition: A | Discharge status: Home / Self Care | Payer: 59 | Attending: Emergency Medicine | Admitting: Emergency Medicine

## 2019-02-10 DIAGNOSIS — J45909 Unspecified asthma, uncomplicated: Secondary | ICD-10-CM | POA: Insufficient documentation

## 2019-02-10 DIAGNOSIS — F17210 Nicotine dependence, cigarettes, uncomplicated: Secondary | ICD-10-CM | POA: Insufficient documentation

## 2019-02-10 DIAGNOSIS — Z79899 Other long term (current) drug therapy: Secondary | ICD-10-CM | POA: Diagnosis not present

## 2019-02-10 DIAGNOSIS — I509 Heart failure, unspecified: Secondary | ICD-10-CM | POA: Insufficient documentation

## 2019-02-10 DIAGNOSIS — I110 Hypertensive heart disease with heart failure: Secondary | ICD-10-CM | POA: Diagnosis not present

## 2019-02-10 DIAGNOSIS — E039 Hypothyroidism, unspecified: Secondary | ICD-10-CM | POA: Insufficient documentation

## 2019-02-10 DIAGNOSIS — R197 Diarrhea, unspecified: Secondary | ICD-10-CM | POA: Diagnosis present

## 2019-02-10 DIAGNOSIS — E860 Dehydration: Secondary | ICD-10-CM | POA: Insufficient documentation

## 2019-02-10 DIAGNOSIS — K529 Noninfective gastroenteritis and colitis, unspecified: Secondary | ICD-10-CM | POA: Diagnosis not present

## 2019-02-10 MED ORDER — PROCHLORPERAZINE EDISYLATE 10 MG/2ML IJ SOLN
5.0000 mg | Freq: Once | INTRAMUSCULAR | Status: AC
  Administered 2019-02-10: 5 mg via INTRAVENOUS
  Filled 2019-02-10: qty 2

## 2019-02-10 MED ORDER — IOHEXOL 300 MG/ML  SOLN
100.0000 mL | Freq: Once | INTRAMUSCULAR | Status: AC | PRN
  Administered 2019-02-11: 01:00:00 75 mL via INTRAVENOUS

## 2019-02-10 MED ORDER — SODIUM CHLORIDE 0.9 % IV BOLUS (SEPSIS)
1000.0000 mL | Freq: Once | INTRAVENOUS | Status: AC
  Administered 2019-02-10: 1000 mL via INTRAVENOUS

## 2019-02-10 MED ORDER — HYDROMORPHONE HCL 1 MG/ML IJ SOLN
0.5000 mg | Freq: Once | INTRAMUSCULAR | Status: AC
  Administered 2019-02-10: 0.5 mg via INTRAVENOUS
  Filled 2019-02-10: qty 1

## 2019-02-10 MED ORDER — SODIUM CHLORIDE 0.9 % IV SOLN
1000.0000 mL | INTRAVENOUS | Status: DC
  Administered 2019-02-10: 1000 mL via INTRAVENOUS

## 2019-02-10 NOTE — ED Provider Notes (Signed)
[facility] EMERGENCY DEPARTMENT Provider Note   [name]: [id number] Arrival date & time: 02/10/19  2242    History   Chief Complaint Chief Complaint  Patient presents with  . Diarrhea    HPI [name] is a 59 y.o. female.     Patient is a 58 year old female who presents to the emergency department with a complaint of abdominal pain and diarrhea.  The patient states this problem started on Tuesday, March 31.  The patient states that she thinks she had a hemorrhoid that ruptured ruptured.  She says since that time she has been having a sensation of having to go to the bathroom, but only passing water.  She says she feels as though something is stabbing her guts out.  She had fever on April 2 and 3 ranging from 199.9-1 02.  The patient has pain mostly in the left upper and lower abdomen, but also at the lower abdomen all the way across.  She has no problems with dysuria.  There is been no history of Crohn's disease.  She feels as though she has to pass her bowels about every , but she says very little is coming out.  She feels better when she is hunched over and holding her abdomen.  Otherwise she says that she has increasing pain.  She is tried Tylenol, but says this does not help her pain at all.  There is been no recent changes in her diet.  No changes in medication.  She does not recall any ill prepared or spoiled foods.  The patient has not traveled recently.  She says she has not been around anyone that she knows of is sick.  She is a smoker, and she says she has a smoker's cough, but otherwise negative for cough.  She presents now for assistance with this increasing pain.  The history is provided by the patient.    Past Medical History:  Diagnosis Date  . Anxiety   . Asthma    Albuterol prn   . CHF (congestive heart failure) (HCC)    takes Furosemide daily  . Chronic back pain    DDD/scoliosis  . Complication of anesthesia    hard to wake up  . COPD (chronic  obstructive pulmonary disease) (HCC)   . DDD (degenerative disc disease)   . Depression    takes Celexa,Clonazepam daily  . Diabetes mellitus    borderline  . Diverticulitis   . Edema of lower extremity   . Emphysema lung (HCC)   . Fibromyalgia   . Gastritis   . GERD (gastroesophageal reflux disease)    takes Pepcid daily  . Gout   . Gout of big toe    RIGHT  . Hemorrhoids   . Hiatal hernia   . History of bronchitis    last time 4-75months ago   . History of colon polyps   . History of kidney stones    is in her left kidney and has been for couple of yrs and no problems  . HTN (hypertension)    takes lisinopril daily  . Hyperlipidemia    but doesn't take any meds  . Hypothyroidism    takes Synthroid daily  . Insomnia    doesn't take any meds for this  . Joint pain   . Joint swelling   . Migraine    last 04/04/18  . Migraine   . MS (multiple sclerosis) (HCC)    early stages  . Muscle spasm  takes Flexeril daily as needed  . Neuropathy   . Osteoporosis   . Phlebitis   . Pneumonia    hx of;last time in 2014  . PONV (postoperative nausea and vomiting)   . Shortness of breath    with exertion or stressed   . Sleep apnea    sleep study done at least 85yrs ago;has CPAP but doesn't use  . Stroke [facility])    left sided weakness  . TIA (transient ischemic attack)    Hx: of  . Urinary frequency     Patient Active Problem List   Diagnosis Date Noted  . Abdominal pain, chronic, epigastric 02/05/2018  . Nausea with vomiting 02/05/2018  . Fibromyalgia 11/02/2017  . Feeling of chest tightness 04/12/2015  . SOB (shortness of breath) 04/12/2015  . Hyperglycemia 04/08/2015  . Tobacco use disorder 04/08/2015  . Headache   . Left-sided weakness   . Hyperlipidemia 04/06/2015  . Migraine 04/05/2015  . Acute CVA (cerebrovascular accident) (HCC) 04/05/2015  . Acute blood loss anemia   . Hematochezia 11/15/2014  . Bilateral lower abdominal cramping 11/15/2014  . Rectal  bleeding 11/15/2014  . Colitis, acute 11/15/2014  . Malnutrition of moderate degree (HCC) 04/17/2014  . Postoperative anemia due to acute blood loss 04/17/2014    Class: Acute  . Malnourished (HCC) 04/17/2014    Class: Present on Admission  . 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 Spondylolisthesis at L4-L5 level 04/14/2014  . Spondylosis of cervical spine 12/30/2013  . Cervical spondylosis without myelopathy 12/30/2013    Class: Chronic  . Spondylolisthesis of lumbar region 12/30/2013    Class: Chronic  . CAP (community acquired pneumonia) 08/24/2013  . GERD (gastroesophageal reflux disease) 08/24/2013  . COPD (chronic obstructive pulmonary disease) (HCC) 08/24/2013  . Chronic pain syndrome 08/24/2013  . Cigarette smoker 08/24/2013  . Chest pain 08/24/2013  . COPD exacerbation (HCC) 08/11/2012  . DM (diabetes mellitus) (HCC) 08/11/2012  . Leucocytosis 08/11/2012  . Hypertension 08/11/2012    Past Surgical History:  Procedure Laterality Date  . ABDOMINAL HYSTERECTOMY    . ANTERIOR CERVICAL DECOMP/DISCECTOMY FUSION N/A 12/30/2013   Procedure: ANTERIOR CERVICAL DISCECTOMY FUSION C4-5, C5-6, plate and screws, allograft, local bone graft;  Surgeon: Kerrin Champagne, MD;  Location: [facility] OR;  Service: Orthopedics;  Laterality: N/A;  . BACK SURGERY     lumbar discectomy  . BIOPSY N/A 01/20/2015   Procedure: COLON BIOPSY;  Surgeon: West Bali, MD;  Location: [facility] ORS;  Service: Endoscopy;  Laterality: N/A;  . BIOPSY  05/22/2018   Procedure: BIOPSY;  Surgeon: West Bali, MD;  Location: [facility] ENDO SUITE;  Service: Endoscopy;;  gastric  . BLADDER SUSPENSION    . CARDIAC CATHETERIZATION    . CARPAL TUNNEL RELEASE Right   . CHOLECYSTECTOMY    . COLONOSCOPY    . COLONOSCOPY WITH PROPOFOL N/A 01/20/2015   SLF: 1. Normal ileum 2. Redundant left colon 3. Smalll internal hemorrhoids.   . ESOPHAGOGASTRODUODENOSCOPY  10/29/07   normal  .  ESOPHAGOGASTRODUODENOSCOPY (EGD) WITH PROPOFOL N/A 05/22/2018   Procedure: ESOPHAGOGASTRODUODENOSCOPY (EGD) WITH PROPOFOL;  Surgeon: West Bali, MD;  Location: [facility] ENDO SUITE;  Service: Endoscopy;  Laterality: N/A;  9:30am  . fundic gland polyp     benign  . POLYPECTOMY N/A 01/20/2015   Procedure: RECTAL POLYPECTOMY;  Surgeon: West Bali, MD;  Location: [facility] ORS;  Service: Endoscopy;  Laterality: N/A;  . SIGMOIDOSCOPY  01/31/08  large internal hemorrhoids/small rectal polyp removed/rare sigmoid diverticula     OB History    Gravida  2   Para  2   Term  0   Preterm  2   AB      Living  2     SAB      TAB      Ectopic      Multiple      Live Births               Home Medications    Prior to Admission medications   Medication Sig Start Date End Date Taking? Authorizing Provider  albuterol (PROVENTIL) (5 MG/ML) 0.5% nebulizer solution Take 1 mL (5 mg total) by nebulization every 6 (six) hours as needed for wheezing or shortness of breath. Patient taking differently: Take 5 mg by nebulization 2 (two) times daily.  08/26/17   Burgess Amor, PA-C  amLODipine (NORVASC) 10 MG tablet Take 1 tablet (10 mg total) by mouth daily. 11/15/17   Aliene Beams, MD  Aspirin-Acetaminophen-Caffeine (GOODY HEADACHE PO) Take 1 Package by mouth daily as needed (headaches).     [provider]  bismuth subsalicylate (PEPTO BISMOL) 262 MG chewable tablet Chew 524 mg by mouth as needed for indigestion or diarrhea or loose stools.    [provider]  citalopram (CELEXA) 40 MG tablet Take 40 mg by mouth daily.    [provider]  clonazePAM (KLONOPIN) 0.5 MG tablet Take 1 tablet (0.5 mg total) by mouth 3 (three) times daily as needed for anxiety. 11/02/17   Aliene Beams, MD  cyclobenzaprine (FLEXERIL) 10 MG tablet Take 1 tablet (10 mg total) by mouth 3 (three) times daily as needed for muscle spasms. 02/16/18   Aliene Beams, MD  famotidine (PEPCID) 20 MG tablet  Take 2 tablets (40 mg total) by mouth daily. 04/08/15   Black, Lesle Chris, NP  fluticasone furoate-vilanterol (BREO ELLIPTA) 200-25 MCG/INH AEPB Inhale 1 puff into the lungs daily. 02/16/18   Aliene Beams, MD  furosemide (LASIX) 20 MG tablet Take 0.5 tablets (10 mg total) by mouth daily. Patient taking differently: Take 20 mg by mouth daily as needed for fluid or edema.  11/15/17   Aliene Beams, MD  ibuprofen (ADVIL,MOTRIN) 800 MG tablet Take 800 mg by mouth 3 (three) times daily. 12/15/15   [provider]  levothyroxine (SYNTHROID, LEVOTHROID) 150 MCG tablet Take 1 tablet (150 mcg total) by mouth daily. 04/05/18   Fields, Darleene Cleaver, MD  lisinopril (PRINIVIL,ZESTRIL) 40 MG tablet Take 1 tablet (40 mg total) by mouth daily. 02/16/18   Aliene Beams, MD  oxymetazoline (AFRIN) 0.05 % nasal spray Place 1 spray into both nostrils daily as needed for congestion.    [provider]  pantoprazole (PROTONIX) 40 MG tablet Take 1 tablet (40 mg total) by mouth 2 (two) times daily before a meal. 05/22/18   Fields, Darleene Cleaver, MD  promethazine (PHENERGAN) 25 MG tablet Take 25 mg by mouth 3 (three) times daily. 12/15/15   [provider]  SUBOXONE 8-2 MG FILM Place 1 Film under the tongue 3 (three) times daily.  12/29/15   [provider]  tiotropium (SPIRIVA HANDIHALER) 18 MCG inhalation capsule Place 1 capsule (18 mcg total) daily into inhaler and inhale. 09/11/17   Aliene Beams, MD  traZODone (DESYREL) 150 MG tablet Take 150 mg by mouth at bedtime.  12/15/15   [provider]    Family History Family History  Problem Relation Age  of Onset  . Diabetes Mother   . Hypertension Mother   . Arthritis Father   . Diabetes Sister   . Neuropathy Sister   . Transient ischemic attack Sister   . Heart disease Sister   . Hypertension Sister   . Cancer - Lung Other   . Cancer - Prostate Other   . Cancer - Other Other   . Colon cancer Paternal Uncle   . Colon cancer Brother         deceased age 60 from colon CA and brain CA    Social History Social History   Tobacco Use  . Smoking status: Current Some Day Smoker    Packs/day: 0.75    Years: 15.00    Pack years: 11.25    Types: Cigarettes  . Smokeless tobacco: Never Used  Substance Use Topics  . Alcohol use: No  . Drug use: No     Allergies   Demerol [meperidine]; Imitrex [sumatriptan]; Penicillins; Sulfa antibiotics; Abilify [aripiprazole]; Levaquin [levofloxacin in d5w]; Neurontin [gabapentin]; Toradol [ketorolac tromethamine]; Tramadol; Ceclor [cefaclor]; and Doxycycline   Review of Systems Review of Systems  Constitutional: Positive for activity change, appetite change and fever.  Gastrointestinal: Positive for abdominal pain and diarrhea.     Physical Exam Updated Vital Signs BP (!) 164/85 (BP Location: Right Arm)   Pulse (!) 114   Temp 99.3 F (37.4 C) (Oral)   Resp 12   Ht 5\' 5"  (1.651 m)   Wt 59.9 kg   SpO2 92%   BMI 21.97 kg/m   Physical Exam Vitals signs and nursing note reviewed. Exam conducted with a chaperone present.  Constitutional:      Appearance: She is well-developed. She is not toxic-appearing.  HENT:     Head: Normocephalic.     Right Ear: Tympanic membrane and external ear normal.     Left Ear: Tympanic membrane and external ear normal.  Eyes:     General: Lids are normal.     Pupils: Pupils are equal, round, and reactive to light.  Neck:     Musculoskeletal: Normal range of motion and neck supple.     Vascular: No carotid bruit.  Cardiovascular:     Rate and Rhythm: Regular rhythm. Tachycardia present.     Pulses: Normal pulses.     Heart sounds: Normal heart sounds.  Pulmonary:     Effort: No respiratory distress.     Breath sounds: Wheezing and rhonchi present.  Abdominal:     General: Bowel sounds are normal. There is no distension.     Palpations: Abdomen is soft.     Tenderness: There is abdominal tenderness in the suprapubic area, left upper quadrant  and left lower quadrant. There is no guarding. Positive signs include psoas sign.  Genitourinary:    Rectum: Guaiac result positive. Tenderness present. No external hemorrhoid or internal hemorrhoid. Normal anal tone.  Musculoskeletal: Normal range of motion.  Lymphadenopathy:     Head:     Right side of head: No submandibular adenopathy.     Left side of head: No submandibular adenopathy.     Cervical: No cervical adenopathy.  Skin:    General: Skin is warm and dry.  Neurological:     Mental Status: She is alert and oriented to person, place, and time.     Cranial Nerves: No cranial nerve deficit.     Sensory: No sensory deficit.  Psychiatric:        Speech: Speech normal.  ED Treatments / Results  Labs (all labs ordered are listed, but only abnormal results are displayed) Labs Reviewed - No data to display  EKG None  Radiology No results found.  Procedures Procedures (including critical care time)  Medications Ordered in ED Medications - No data to display   Initial Impression / Assessment and Plan / ED Course  I have reviewed the triage vital signs and the nursing notes.  Pertinent labs & imaging results that were available during my care of the patient were reviewed by me and considered in my medical decision making (see chart for details).          Final Clinical Impressions(s) / ED Diagnoses MDM  The patient's heart rate is elevated.  Blood pressure is elevated.  Pulse oximetry is slightly low at 92%.  Patient is a smoker and has a history of COPD.  Patient is having problems with sensation of having to go to the bathroom to pass her stool, but only passing water after she goes to the bathroom. Temperature is 99.3 here in the emergency department.  Patient's temperature max has been 102 at home.  Urine analysis is within normal limits.  Urine specific gravity is 1.019.  No evidence for major dehydration. Complete blood count is well within normal  limits. Stool is positive for occult blood. The potassium is low on the comprehensive metabolic panel at 3, the glucose is slightly elevated at 152, otherwise within normal limits.  The anion gap is normal at 8. Lipase is normal at 21.  We will obtain a CT scan of the abdomen.  I am concerned for a return bout of colitis.  I have updated the patient on the findings up to this point.  Patient states she is continuing to have pain.  Pain medication will also be updated.  CT scan pending.  Patient care will be continued by Dr. Blinda Leatherwood.   Final diagnoses:  Colitis    ED Discharge Orders         Ordered    ciprofloxacin (CIPRO) 500 MG tablet  2 times daily     02/11/19 0214    metroNIDAZOLE (FLAGYL) 500 MG tablet  3 times daily     02/11/19 0214           Ivery Quale, PA-C 02/11/19 1935    Gilda Crease, MD 02/14/19 [phone]

## 2019-02-10 NOTE — ED Triage Notes (Signed)
Patient states that she has been having diarrhea since Wednesday. Patient also complains of abdominal pain. Patient states she has been unable to eat. Patient temp currently is 99.3.

## 2019-02-11 LAB — URINALYSIS, ROUTINE W REFLEX MICROSCOPIC
Bilirubin Urine: NEGATIVE
Glucose, UA: NEGATIVE mg/dL
Hgb urine dipstick: NEGATIVE
Ketones, ur: NEGATIVE mg/dL
Leukocytes,Ua: NEGATIVE
Nitrite: NEGATIVE
Protein, ur: NEGATIVE mg/dL
Specific Gravity, Urine: 1.019 (ref 1.005–1.030)
pH: 5 (ref 5.0–8.0)

## 2019-02-11 LAB — CBC WITH DIFFERENTIAL/PLATELET
Abs Immature Granulocytes: 0.05 10*3/uL (ref 0.00–0.07)
Basophils Absolute: 0 10*3/uL (ref 0.0–0.1)
Basophils Relative: 0 %
Eosinophils Absolute: 0.2 10*3/uL (ref 0.0–0.5)
Eosinophils Relative: 2 %
HCT: 37.3 % (ref 36.0–46.0)
Hemoglobin: 12.1 g/dL (ref 12.0–15.0)
Immature Granulocytes: 1 %
Lymphocytes Relative: 13 %
Lymphs Abs: 1.1 10*3/uL (ref 0.7–4.0)
MCH: 28.5 pg (ref 26.0–34.0)
MCHC: 32.4 g/dL (ref 30.0–36.0)
MCV: 87.8 fL (ref 80.0–100.0)
Monocytes Absolute: 0.7 10*3/uL (ref 0.1–1.0)
Monocytes Relative: 7 %
Neutro Abs: 6.9 10*3/uL (ref 1.7–7.7)
Neutrophils Relative %: 77 %
Platelets: 177 10*3/uL (ref 150–400)
RBC: 4.25 MIL/uL (ref 3.87–5.11)
RDW: 14.2 % (ref 11.5–15.5)
WBC: 8.9 10*3/uL (ref 4.0–10.5)
nRBC: 0 % (ref 0.0–0.2)

## 2019-02-11 LAB — COMPREHENSIVE METABOLIC PANEL
ALT: 34 U/L (ref 0–44)
AST: 23 U/L (ref 15–41)
Albumin: 3.6 g/dL (ref 3.5–5.0)
Alkaline Phosphatase: 120 U/L (ref 38–126)
Anion gap: 8 (ref 5–15)
BUN: 8 mg/dL (ref 6–20)
CO2: 30 mmol/L (ref 22–32)
Calcium: 8.7 mg/dL — ABNORMAL LOW (ref 8.9–10.3)
Chloride: 100 mmol/L (ref 98–111)
Creatinine, Ser: 0.67 mg/dL (ref 0.44–1.00)
GFR calc Af Amer: >60 mL/min (ref >60)
GFR calc non Af Amer: >60 mL/min (ref >60)
Glucose, Bld: 152 mg/dL — ABNORMAL HIGH (ref 70–99)
Potassium: 3 mmol/L — ABNORMAL LOW (ref 3.5–5.1)
Sodium: 138 mmol/L (ref 135–145)
Total Bilirubin: 0.2 mg/dL — ABNORMAL LOW (ref 0.3–1.2)
Total Protein: 6.5 g/dL (ref 6.5–8.1)

## 2019-02-11 LAB — LIPASE, BLOOD: Lipase: 21 U/L (ref 11–51)

## 2019-02-11 LAB — POC OCCULT BLOOD, ED: Fecal Occult Bld: POSITIVE — AB

## 2019-02-11 MED ORDER — CIPROFLOXACIN HCL 250 MG PO TABS
500.0000 mg | ORAL_TABLET | Freq: Once | ORAL | Status: AC
  Administered 2019-02-11: 02:00:00 500 mg via ORAL
  Filled 2019-02-11: qty 2

## 2019-02-11 MED ORDER — CIPROFLOXACIN HCL 500 MG PO TABS: 500.0000 mg | ORAL_TABLET | Freq: Two times a day (BID) | ORAL | 0 refills | Status: DC

## 2019-02-11 MED ORDER — HYDROMORPHONE HCL 1 MG/ML IJ SOLN
INTRAMUSCULAR | Status: AC
  Filled 2019-02-11: qty 1

## 2019-02-11 MED ORDER — POTASSIUM CHLORIDE CRYS ER 20 MEQ PO TBCR
EXTENDED_RELEASE_TABLET | ORAL | Status: AC
  Filled 2019-02-11: qty 2

## 2019-02-11 MED ORDER — METRONIDAZOLE 500 MG PO TABS: 500.0000 mg | ORAL_TABLET | Freq: Three times a day (TID) | ORAL | 0 refills | Status: DC

## 2019-02-11 MED ORDER — HYDROMORPHONE HCL 1 MG/ML IJ SOLN
0.5000 mg | Freq: Once | INTRAMUSCULAR | Status: AC
  Administered 2019-02-11: 01:00:00 0.5 mg via INTRAVENOUS

## 2019-02-11 MED ORDER — POTASSIUM CHLORIDE CRYS ER 20 MEQ PO TBCR
40.0000 meq | EXTENDED_RELEASE_TABLET | Freq: Once | ORAL | Status: AC
  Administered 2019-02-11: 40 meq via ORAL

## 2019-02-11 MED ORDER — METRONIDAZOLE 500 MG PO TABS
500.0000 mg | ORAL_TABLET | Freq: Once | ORAL | Status: AC
  Administered 2019-02-11: 500 mg via ORAL
  Filled 2019-02-11: qty 1

## 2019-02-11 NOTE — ED Provider Notes (Signed)
Patient presented to the ER with abdominal pain and diarrhea.  Patient has a history of nonspecific colitis.  She has not noticed any rectal bleeding.  Face to face Exam: HEENT - PERRLA Lungs - CTAB Heart - RRR, no M/R/G Abd -soft, diffuse left-sided tenderness, no guarding or rebound. Neuro - alert, oriented x3  Plan: Blood work is reassuringly normal.  CT scan shows evidence of uncomplicated colitis.  Will treat with Cipro and Flagyl.  Patient was Levaquin as an allergy, however, her allergy is simply abdominal pain.  This is not a true allergy.  She also has multiple real allergies to other antibiotics.   Gilda Crease, MD 02/11/19 [phone]

## 2019-02-11 NOTE — ED Notes (Signed)
Pt off unit to CT at this time

## 2019-02-26 MED FILL — PROMETHAZINE 25 MG TABLET: 25 | 15 days supply | Qty: 45 | Fill #0

## 2019-02-26 MED FILL — SUBOXONE 8 MG-2 MG SL FILM: 8-2 | 14 days supply | Qty: 42 | Fill #0

## 2019-03-07 IMAGING — DX DG CHEST 2V
2 series · 2 of 2 positions shown · non-contrast
Comparison: 01/01/2016

CLINICAL DATA: Cough for 2 weeks

EXAM:
CHEST  2 VIEW

[chest pa]
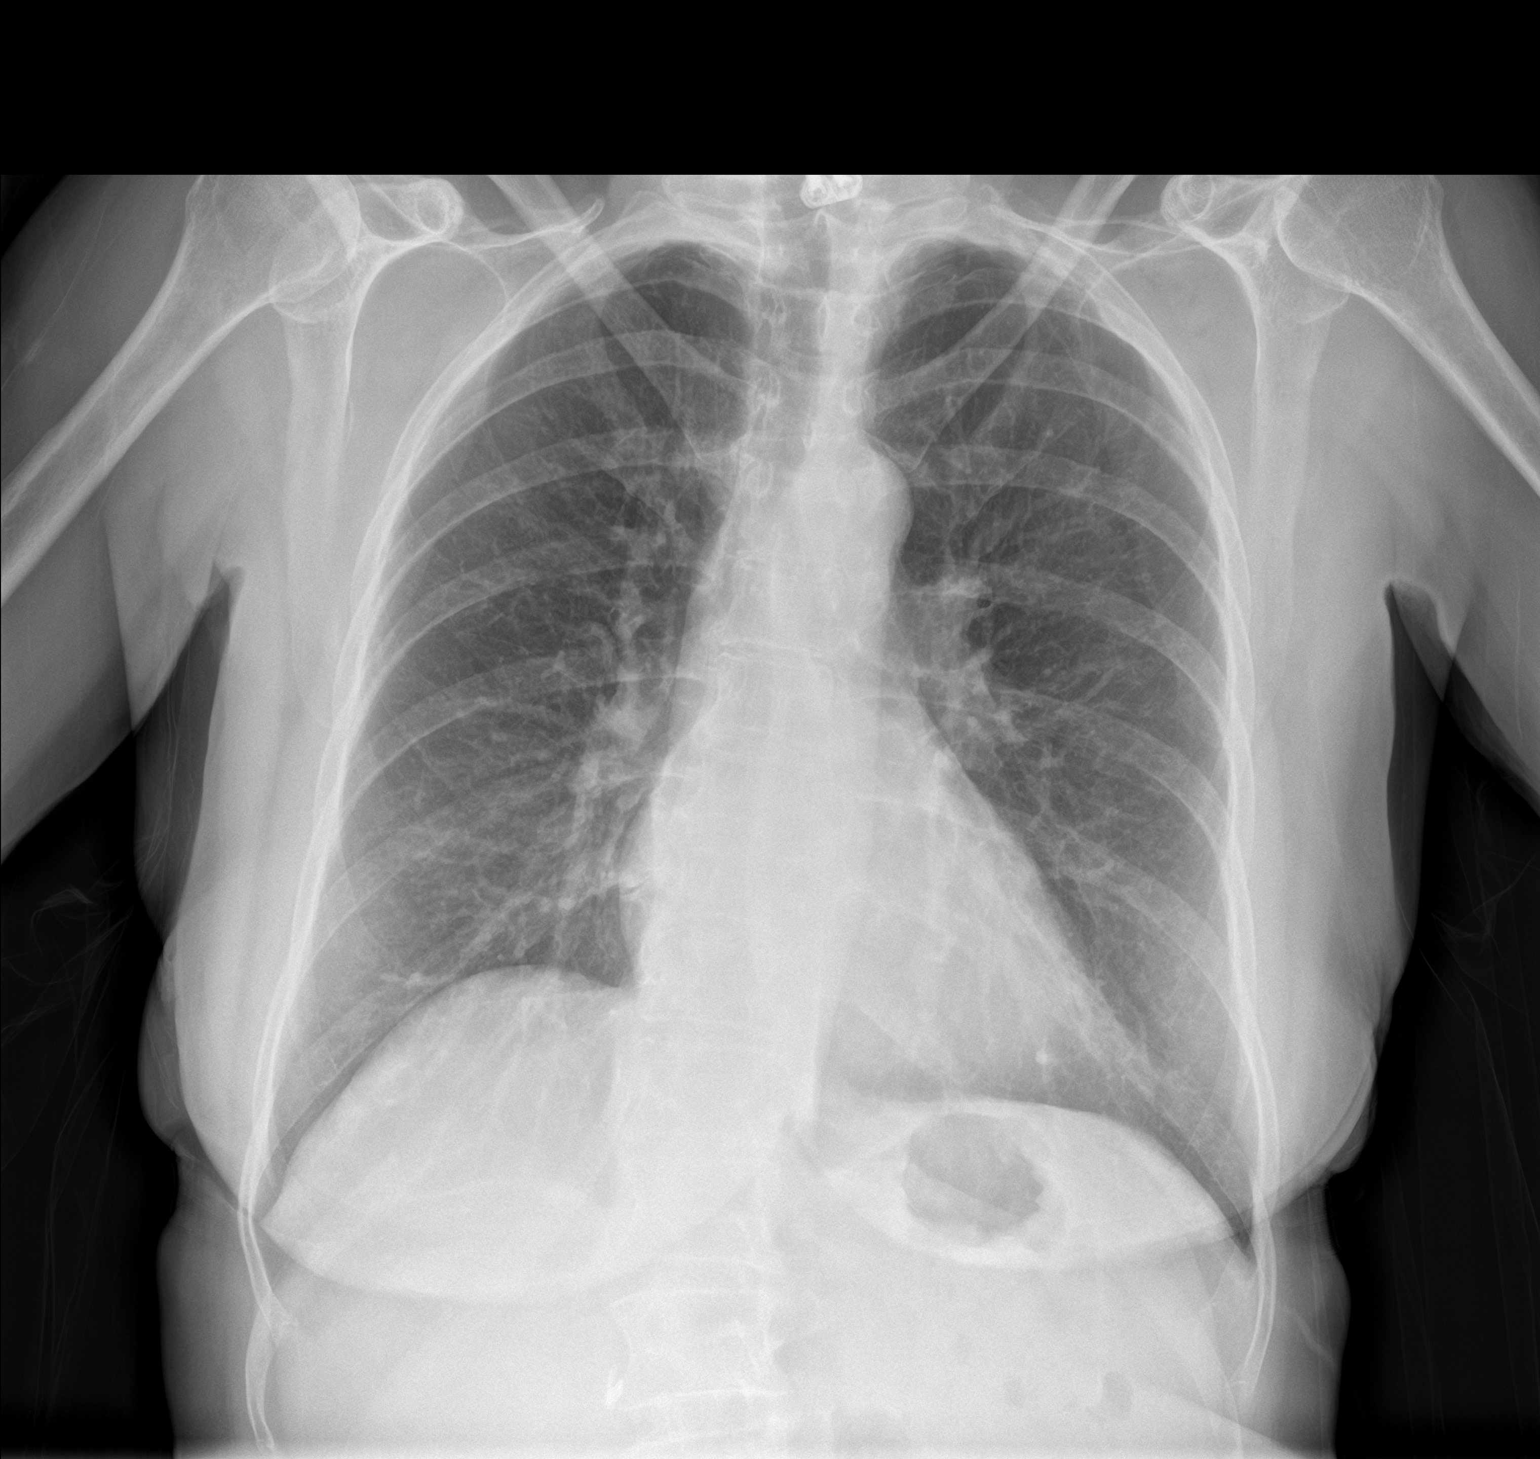

[chest lat]
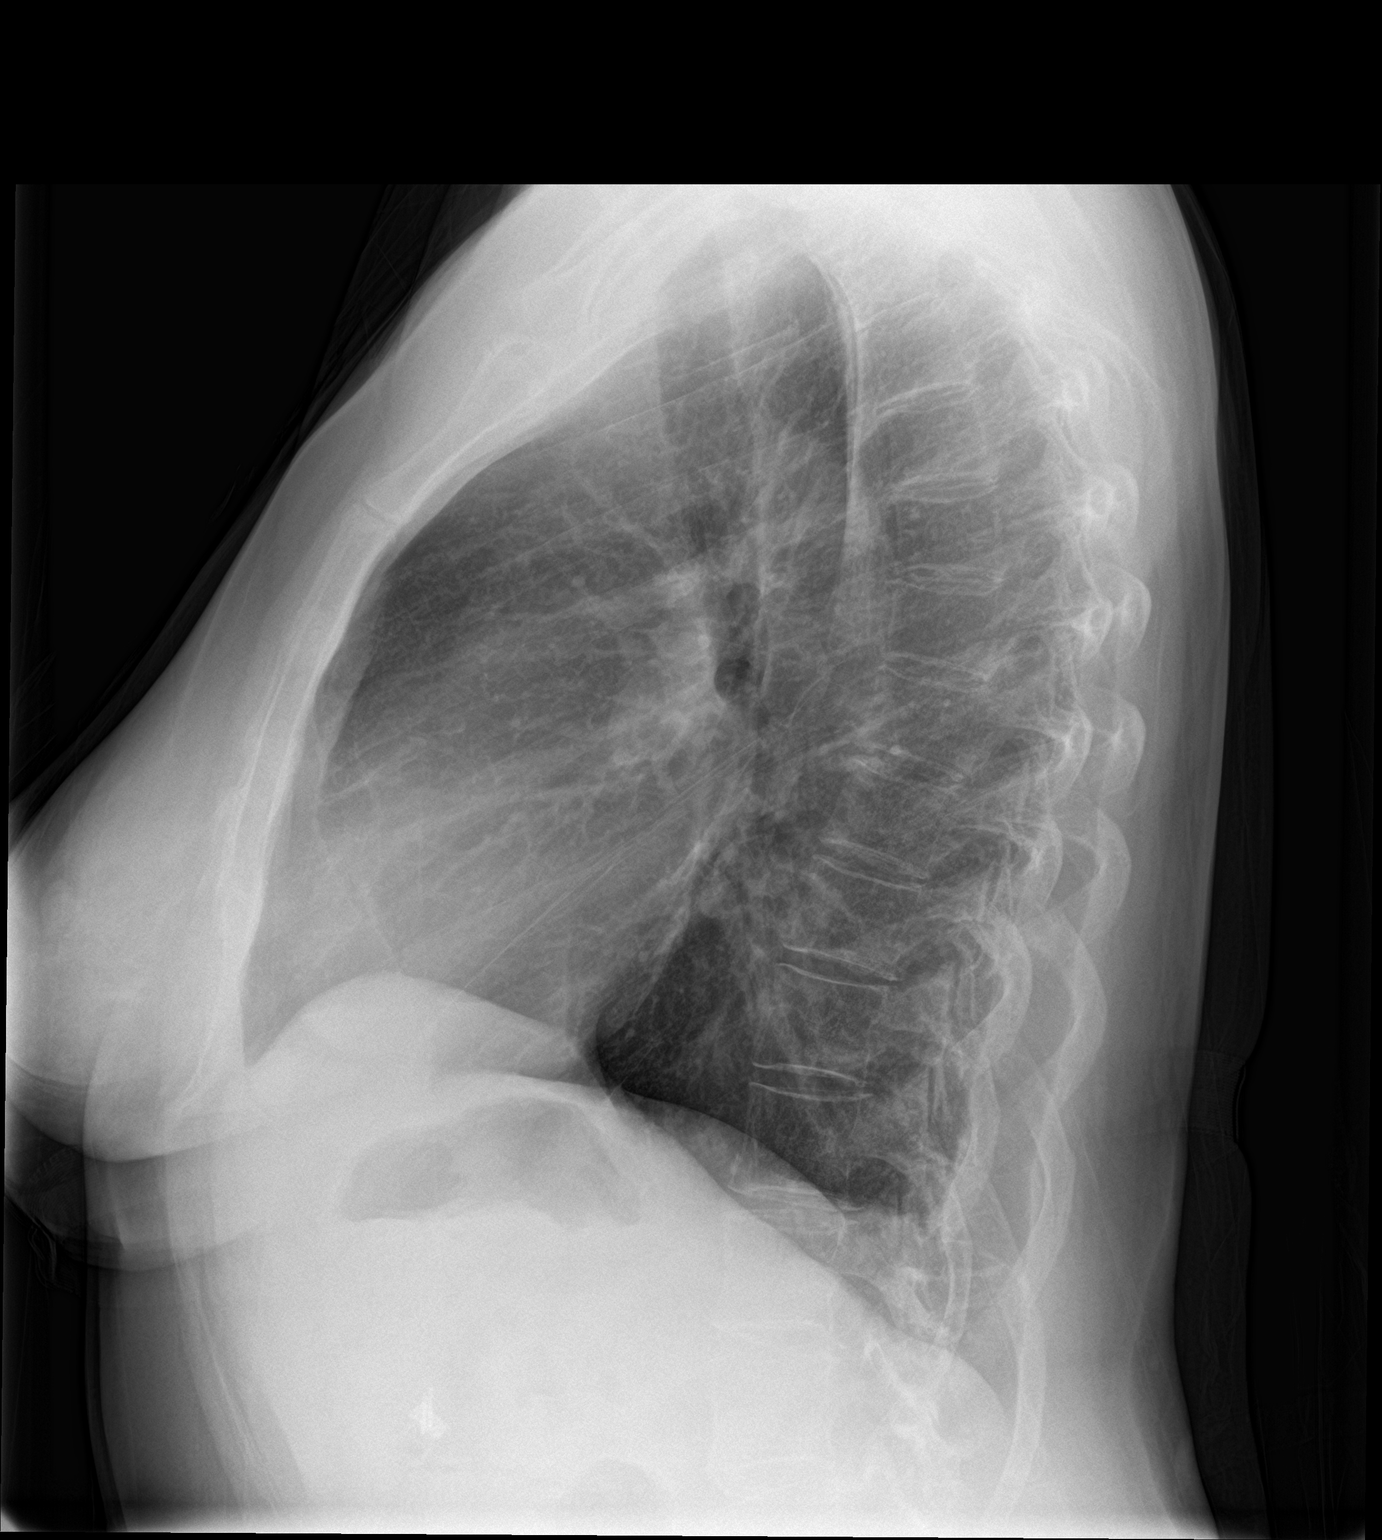

[2 of 2 positions shown; findings below may reference images not displayed]

FINDINGS: Cardiac shadow is within normal limits. The lungs are well aerated
bilaterally. No focal infiltrate or sizable effusion is seen. No
acute bony abnormality is noted. Postsurgical changes in the
cervical spine are seen.
IMPRESSION: No active cardiopulmonary disease.

## 2019-03-12 MED FILL — BUPRENORPHINE HCL-NALOXONE: 8-2 | 14 days supply | Qty: 42 | Fill #0

## 2019-03-12 MED FILL — PROMETHAZINE 25 MG TABLET: 25 | 15 days supply | Qty: 45 | Fill #0

## 2019-03-26 MED FILL — BUPRENORPHINE HCL-NALOXONE: 8-2 | 14 days supply | Qty: 42 | Fill #0

## 2019-03-26 MED FILL — PROMETHAZINE 25 MG TABLET: 25 | 15 days supply | Qty: 45 | Fill #0

## 2019-04-09 MED FILL — BUPRENORPHINE HCL-NALOXONE: 8-2 | 14 days supply | Qty: 42 | Fill #0

## 2019-04-09 MED FILL — PROMETHAZINE 25 MG TABLET: 25 | 15 days supply | Qty: 45 | Fill #0

## 2019-04-23 MED FILL — BUPRENORPHINE HCL-NALOXONE: 8-2 | 14 days supply | Qty: 42 | Fill #0

## 2019-04-23 MED FILL — PROMETHAZINE 25 MG TABLET: 25 | 15 days supply | Qty: 45 | Fill #0

## 2019-05-07 MED FILL — BUPRENORPHINE HCL-NALOXONE: 8-2 | 14 days supply | Qty: 42 | Fill #0

## 2019-05-07 MED FILL — MOVANTIK 25 MG TABLET: 25 | 30 days supply | Qty: 30 | Fill #0

## 2019-05-07 MED FILL — PROMETHAZINE 25 MG TABLET: 25 | 15 days supply | Qty: 45 | Fill #0

## 2019-05-17 ENCOUNTER — Other Ambulatory Visit: Payer: Self-pay

## 2019-05-17 ENCOUNTER — Observation Stay (HOSPITAL_COMMUNITY)
Admission: EM | Admit: 2019-05-17 | Discharge: 2019-05-18 | Disposition: A | Discharge status: Home / Self Care | Payer: Self-pay | Attending: Internal Medicine | Admitting: Internal Medicine

## 2019-05-17 ENCOUNTER — Observation Stay (HOSPITAL_COMMUNITY): Payer: Self-pay

## 2019-05-17 ENCOUNTER — Encounter (HOSPITAL_COMMUNITY): Payer: Self-pay

## 2019-05-17 DIAGNOSIS — Z833 Family history of diabetes mellitus: Secondary | ICD-10-CM | POA: Insufficient documentation

## 2019-05-17 DIAGNOSIS — Z7982 Long term (current) use of aspirin: Secondary | ICD-10-CM | POA: Insufficient documentation

## 2019-05-17 DIAGNOSIS — E860 Dehydration: Secondary | ICD-10-CM | POA: Insufficient documentation

## 2019-05-17 DIAGNOSIS — I69354 Hemiplegia and hemiparesis following cerebral infarction affecting left non-dominant side: Secondary | ICD-10-CM | POA: Insufficient documentation

## 2019-05-17 DIAGNOSIS — Z882 Allergy status to sulfonamides status: Secondary | ICD-10-CM | POA: Insufficient documentation

## 2019-05-17 DIAGNOSIS — J439 Emphysema, unspecified: Secondary | ICD-10-CM | POA: Insufficient documentation

## 2019-05-17 DIAGNOSIS — G4700 Insomnia, unspecified: Secondary | ICD-10-CM | POA: Insufficient documentation

## 2019-05-17 DIAGNOSIS — F17210 Nicotine dependence, cigarettes, uncomplicated: Secondary | ICD-10-CM | POA: Insufficient documentation

## 2019-05-17 DIAGNOSIS — Z8719 Personal history of other diseases of the digestive system: Secondary | ICD-10-CM | POA: Insufficient documentation

## 2019-05-17 DIAGNOSIS — N179 Acute kidney failure, unspecified: Secondary | ICD-10-CM | POA: Diagnosis present

## 2019-05-17 DIAGNOSIS — M810 Age-related osteoporosis without current pathological fracture: Secondary | ICD-10-CM | POA: Insufficient documentation

## 2019-05-17 DIAGNOSIS — I10 Essential (primary) hypertension: Secondary | ICD-10-CM | POA: Diagnosis present

## 2019-05-17 DIAGNOSIS — Z888 Allergy status to other drugs, medicaments and biological substances status: Secondary | ICD-10-CM | POA: Insufficient documentation

## 2019-05-17 DIAGNOSIS — F329 Major depressive disorder, single episode, unspecified: Secondary | ICD-10-CM | POA: Insufficient documentation

## 2019-05-17 DIAGNOSIS — Z791 Long term (current) use of non-steroidal anti-inflammatories (NSAID): Secondary | ICD-10-CM | POA: Insufficient documentation

## 2019-05-17 DIAGNOSIS — F419 Anxiety disorder, unspecified: Secondary | ICD-10-CM | POA: Insufficient documentation

## 2019-05-17 DIAGNOSIS — K449 Diaphragmatic hernia without obstruction or gangrene: Secondary | ICD-10-CM | POA: Insufficient documentation

## 2019-05-17 DIAGNOSIS — Z880 Allergy status to penicillin: Secondary | ICD-10-CM | POA: Insufficient documentation

## 2019-05-17 DIAGNOSIS — G8929 Other chronic pain: Secondary | ICD-10-CM | POA: Insufficient documentation

## 2019-05-17 DIAGNOSIS — G92 Toxic encephalopathy: Secondary | ICD-10-CM | POA: Insufficient documentation

## 2019-05-17 DIAGNOSIS — T428X1A Poisoning by antiparkinsonism drugs and other central muscle-tone depressants, accidental (unintentional), initial encounter: Principal | ICD-10-CM | POA: Diagnosis present

## 2019-05-17 DIAGNOSIS — I519 Heart disease, unspecified: Secondary | ICD-10-CM | POA: Diagnosis present

## 2019-05-17 DIAGNOSIS — G4730 Sleep apnea, unspecified: Secondary | ICD-10-CM | POA: Insufficient documentation

## 2019-05-17 DIAGNOSIS — Z8701 Personal history of pneumonia (recurrent): Secondary | ICD-10-CM | POA: Insufficient documentation

## 2019-05-17 DIAGNOSIS — G35 Multiple sclerosis: Secondary | ICD-10-CM | POA: Insufficient documentation

## 2019-05-17 DIAGNOSIS — F32A Depression, unspecified: Secondary | ICD-10-CM | POA: Diagnosis present

## 2019-05-17 DIAGNOSIS — I110 Hypertensive heart disease with heart failure: Secondary | ICD-10-CM | POA: Insufficient documentation

## 2019-05-17 DIAGNOSIS — J45909 Unspecified asthma, uncomplicated: Secondary | ICD-10-CM | POA: Insufficient documentation

## 2019-05-17 DIAGNOSIS — I509 Heart failure, unspecified: Secondary | ICD-10-CM | POA: Insufficient documentation

## 2019-05-17 DIAGNOSIS — Z7951 Long term (current) use of inhaled steroids: Secondary | ICD-10-CM | POA: Insufficient documentation

## 2019-05-17 DIAGNOSIS — M549 Dorsalgia, unspecified: Secondary | ICD-10-CM | POA: Insufficient documentation

## 2019-05-17 DIAGNOSIS — Z885 Allergy status to narcotic agent status: Secondary | ICD-10-CM | POA: Insufficient documentation

## 2019-05-17 DIAGNOSIS — I5189 Other ill-defined heart diseases: Secondary | ICD-10-CM | POA: Diagnosis present

## 2019-05-17 DIAGNOSIS — J449 Chronic obstructive pulmonary disease, unspecified: Secondary | ICD-10-CM | POA: Diagnosis present

## 2019-05-17 DIAGNOSIS — T428X4A Poisoning by antiparkinsonism drugs and other central muscle-tone depressants, undetermined, initial encounter: Secondary | ICD-10-CM

## 2019-05-17 DIAGNOSIS — Z9049 Acquired absence of other specified parts of digestive tract: Secondary | ICD-10-CM | POA: Insufficient documentation

## 2019-05-17 DIAGNOSIS — Z8249 Family history of ischemic heart disease and other diseases of the circulatory system: Secondary | ICD-10-CM | POA: Insufficient documentation

## 2019-05-17 DIAGNOSIS — Z79899 Other long term (current) drug therapy: Secondary | ICD-10-CM | POA: Insufficient documentation

## 2019-05-17 DIAGNOSIS — Z881 Allergy status to other antibiotic agents status: Secondary | ICD-10-CM | POA: Insufficient documentation

## 2019-05-17 DIAGNOSIS — K219 Gastro-esophageal reflux disease without esophagitis: Secondary | ICD-10-CM | POA: Diagnosis present

## 2019-05-17 DIAGNOSIS — E785 Hyperlipidemia, unspecified: Secondary | ICD-10-CM | POA: Diagnosis present

## 2019-05-17 DIAGNOSIS — G43909 Migraine, unspecified, not intractable, without status migrainosus: Secondary | ICD-10-CM | POA: Insufficient documentation

## 2019-05-17 DIAGNOSIS — N1330 Unspecified hydronephrosis: Secondary | ICD-10-CM | POA: Insufficient documentation

## 2019-05-17 DIAGNOSIS — Z79890 Hormone replacement therapy (postmenopausal): Secondary | ICD-10-CM | POA: Insufficient documentation

## 2019-05-17 DIAGNOSIS — T50901A Poisoning by unspecified drugs, medicaments and biological substances, accidental (unintentional), initial encounter: Secondary | ICD-10-CM

## 2019-05-17 DIAGNOSIS — M62838 Other muscle spasm: Secondary | ICD-10-CM | POA: Insufficient documentation

## 2019-05-17 DIAGNOSIS — E1140 Type 2 diabetes mellitus with diabetic neuropathy, unspecified: Secondary | ICD-10-CM | POA: Insufficient documentation

## 2019-05-17 DIAGNOSIS — Z87442 Personal history of urinary calculi: Secondary | ICD-10-CM | POA: Insufficient documentation

## 2019-05-17 DIAGNOSIS — Z1159 Encounter for screening for other viral diseases: Secondary | ICD-10-CM | POA: Insufficient documentation

## 2019-05-17 DIAGNOSIS — E039 Hypothyroidism, unspecified: Secondary | ICD-10-CM | POA: Diagnosis present

## 2019-05-17 LAB — URINALYSIS, ROUTINE W REFLEX MICROSCOPIC
Bilirubin Urine: NEGATIVE
Glucose, UA: NEGATIVE mg/dL
Hgb urine dipstick: NEGATIVE
Ketones, ur: NEGATIVE mg/dL
Leukocytes,Ua: NEGATIVE
Nitrite: NEGATIVE
Protein, ur: NEGATIVE mg/dL
Specific Gravity, Urine: 1.012 (ref 1.005–1.030)
pH: 5 (ref 5.0–8.0)

## 2019-05-17 LAB — CBC WITH DIFFERENTIAL/PLATELET
Abs Immature Granulocytes: 0.02 10*3/uL (ref 0.00–0.07)
Basophils Absolute: 0.1 10*3/uL (ref 0.0–0.1)
Basophils Relative: 1 %
Eosinophils Absolute: 0.3 10*3/uL (ref 0.0–0.5)
Eosinophils Relative: 5 %
HCT: 40.5 % (ref 36.0–46.0)
Hemoglobin: 13.2 g/dL (ref 12.0–15.0)
Immature Granulocytes: 0 %
Lymphocytes Relative: 31 %
Lymphs Abs: 2.2 10*3/uL (ref 0.7–4.0)
MCH: 29.3 pg (ref 26.0–34.0)
MCHC: 32.6 g/dL (ref 30.0–36.0)
MCV: 90 fL (ref 80.0–100.0)
Monocytes Absolute: 0.7 10*3/uL (ref 0.1–1.0)
Monocytes Relative: 9 %
Neutro Abs: 3.8 10*3/uL (ref 1.7–7.7)
Neutrophils Relative %: 54 %
Platelets: 191 10*3/uL (ref 150–400)
RBC: 4.5 MIL/uL (ref 3.87–5.11)
RDW: 13.6 % (ref 11.5–15.5)
WBC: 7 10*3/uL (ref 4.0–10.5)
nRBC: 0 % (ref 0.0–0.2)

## 2019-05-17 LAB — CBG MONITORING, ED: Glucose-Capillary: 117 mg/dL — ABNORMAL HIGH (ref 70–99)

## 2019-05-17 LAB — COMPREHENSIVE METABOLIC PANEL
ALT: 16 U/L (ref 0–44)
AST: 12 U/L — ABNORMAL LOW (ref 15–41)
Albumin: 4.2 g/dL (ref 3.5–5.0)
Alkaline Phosphatase: 81 U/L (ref 38–126)
Anion gap: 10 (ref 5–15)
BUN: 39 mg/dL — ABNORMAL HIGH (ref 6–20)
CO2: 23 mmol/L (ref 22–32)
Calcium: 9.6 mg/dL (ref 8.9–10.3)
Chloride: 108 mmol/L (ref 98–111)
Creatinine, Ser: 2.62 mg/dL — ABNORMAL HIGH (ref 0.44–1.00)
GFR calc Af Amer: 22 mL/min — ABNORMAL LOW (ref >60)
GFR calc non Af Amer: 19 mL/min — ABNORMAL LOW (ref >60)
Glucose, Bld: 103 mg/dL — ABNORMAL HIGH (ref 70–99)
Potassium: 4.9 mmol/L (ref 3.5–5.1)
Sodium: 141 mmol/L (ref 135–145)
Total Bilirubin: 0.2 mg/dL — ABNORMAL LOW (ref 0.3–1.2)
Total Protein: 7 g/dL (ref 6.5–8.1)

## 2019-05-17 LAB — RAPID URINE DRUG SCREEN, HOSP PERFORMED
Amphetamines: NOT DETECTED
Barbiturates: NOT DETECTED
Benzodiazepines: POSITIVE — AB
Cocaine: NOT DETECTED
Opiates: NOT DETECTED
Tetrahydrocannabinol: NOT DETECTED

## 2019-05-17 LAB — SALICYLATE LEVEL: Salicylate Lvl: <7 mg/dL (ref 2.8–30.0)

## 2019-05-17 LAB — MRSA PCR SCREENING: MRSA by PCR: NEGATIVE

## 2019-05-17 LAB — CREATININE, URINE, RANDOM: Creatinine, Urine: 62.39 mg/dL

## 2019-05-17 LAB — SARS CORONAVIRUS 2 BY RT PCR (HOSPITAL ORDER, PERFORMED IN ~~LOC~~ HOSPITAL LAB): SARS Coronavirus 2: NEGATIVE

## 2019-05-17 LAB — CK: Total CK: 87 U/L (ref 38–234)

## 2019-05-17 LAB — ACETAMINOPHEN LEVEL: Acetaminophen (Tylenol), Serum: <10 ug/mL — ABNORMAL LOW (ref 10–30)

## 2019-05-17 LAB — ETHANOL: Alcohol, Ethyl (B): <10 mg/dL (ref <10)

## 2019-05-17 LAB — SODIUM, URINE, RANDOM: Sodium, Ur: 88 mmol/L

## 2019-05-17 MED ORDER — AMMONIA AROMATIC IN INHA
0.3000 mL | Freq: Once | RESPIRATORY_TRACT | Status: AC
  Administered 2019-05-17: 0.3 mL via RESPIRATORY_TRACT
  Filled 2019-05-17: qty 10

## 2019-05-17 MED ORDER — ACETAMINOPHEN 325 MG PO TABS
650.0000 mg | ORAL_TABLET | Freq: Four times a day (QID) | ORAL | Status: DC | PRN
  Administered 2019-05-17 – 2019-05-18 (×2): 650 mg via ORAL
  Filled 2019-05-17 (×2): qty 2

## 2019-05-17 MED ORDER — ENOXAPARIN SODIUM 30 MG/0.3ML ~~LOC~~ SOLN
30.0000 mg | SUBCUTANEOUS | Status: DC
  Administered 2019-05-17: 30 mg via SUBCUTANEOUS
  Filled 2019-05-17: qty 0.3

## 2019-05-17 MED ORDER — SODIUM CHLORIDE 0.45 % IV BOLUS
1000.0000 mL | Freq: Once | INTRAVENOUS | Status: AC
  Administered 2019-05-17: 1000 mL via INTRAVENOUS

## 2019-05-17 MED ORDER — ACETAMINOPHEN 650 MG RE SUPP: 650.0000 mg | Freq: Four times a day (QID) | RECTAL | Status: DC | PRN

## 2019-05-17 MED ORDER — ALUM & MAG HYDROXIDE-SIMETH 200-200-20 MG/5ML PO SUSP
30.0000 mL | ORAL | Status: DC | PRN
  Administered 2019-05-17 – 2019-05-18 (×2): 30 mL via ORAL
  Filled 2019-05-17 (×2): qty 30

## 2019-05-17 MED ORDER — SODIUM CHLORIDE 0.9 % IV BOLUS
1000.0000 mL | Freq: Once | INTRAVENOUS | Status: AC
  Administered 2019-05-17: 1000 mL via INTRAVENOUS

## 2019-05-17 MED ORDER — SODIUM CHLORIDE 0.9 % IV SOLN: INTRAVENOUS | Status: DC

## 2019-05-17 MED ORDER — SODIUM CHLORIDE 0.45 % IV SOLN
INTRAVENOUS | Status: DC
  Administered 2019-05-17 – 2019-05-18 (×3): via INTRAVENOUS

## 2019-05-17 NOTE — ED Notes (Signed)
Pt more awake after ammonia inhalant. Admitting physician at bedside

## 2019-05-17 NOTE — [facility] Counselor (Signed)
This counselor spoke with patient's RN, Nira Conn. She reports physician cancelled consult. Will remove from shift report.

## 2019-05-17 NOTE — Progress Notes (Signed)
Per HPI: [name] is a 58 y.o. female with medical history significant of anxiety, depression, insomnia, asthma/COPD/emphysema, grade 1 diastolic dysfunction, chronic back pain, type 2 diabetes, fibromyalgia, gastritis, GERD/hiatal hernia, history of gout, hemorrhoids, colon polyps, urolithiasis, hypertension, hyperlipidemia, hypothyroidism, migraine headaches, unspecified neuropathy, sleep apnea, history of pneumonia, osteoporosis, history of CVA with left-sided hemiparesis, history of TIA who was brought to the emergency department due to being found unable to respond appropriately by family members after she took baclofen.  The daughter stated that she may have taken up to 6 tablets, however the patient stated she only took 1.  She was initially very altered and was a candidate for ET intubation, but her mental status has been improving steadily since then.  The patient is more alert now, but still seems to react very slowly and seems to be disoriented at times.  She is currently oriented x2 and is able to converse.  She is partially oriented to date, but is disoriented to situation.  She kept asking the same questions multiple times, particularly "what is going on?".  She was unable to provide much on review of systems  Patient was admitted for acute toxic encephalopathy secondary to baclofen overdose.  It is unknown if this was accidental or intentional and patient continues to remain somnolent in the ICU.  Will perform TTS evaluation once patient is more awake and alert and continue to monitor closely.  She is also noted to have AKI and is on IV fluids with further work-up pending.  Repeat a.m. labs and reevaluate for discharge by a.m.

## 2019-05-17 NOTE — H&P (Signed)
History and Physical    [name] ZOX:[id number] DOB: [birth year] DOA: 05/17/2019  PCP: Roger KillWilliams, Breejante J, PA-C   Patient coming from: Home.  I have personally briefly reviewed patient's [facility]  Chief Complaint: Altered mental status.  HPI: [name] is a 58 y.o. female with medical history significant of anxiety, depression, insomnia, asthma/COPD/emphysema, grade 1 diastolic dysfunction, chronic back pain, type 2 diabetes, fibromyalgia, gastritis, GERD/hiatal hernia, history of gout, hemorrhoids, colon polyps, urolithiasis, hypertension, hyperlipidemia, hypothyroidism, migraine headaches, unspecified neuropathy, sleep apnea, history of pneumonia, osteoporosis, history of CVA with left-sided hemiparesis, history of TIA who was brought to the emergency department due to being found unable to respond appropriately by family members after she took baclofen.  The daughter stated that she may have taken up to 6 tablets, however the patient stated she only took 1.  She was initially very altered and was a candidate for ET intubation, but her mental status has been improving steadily since then.  The patient is more alert now, but still seems to react very slowly and seems to be disoriented at times.  She is currently oriented x2 and is able to converse.  She is partially oriented to date, but is disoriented to situation.  She kept asking the same questions multiple times, particularly "what is going on?".  She was unable to provide much on review of systems  ED Course: Initial vital signs were temperature 98 F, pulse 73, respiration 14, blood pressure 114/60 mmHg and O2 sat 96% on room air.  The patient received a 1000 mL NS bolus.  Her urinalysis was normal.  UDS was positive for benzodiazepines.  CBC was normal.  CMP shows a glucose of 103, BUN of 39 creatinine of 2.62 mg/dL.  The rest of the CMP measurements are unremarkable.  Total CK, ethyl alcohol,  acetaminophen and salicylate levels were normal.  Review of Systems: As per HPI otherwise 10 point review of systems negative.   Past Medical History:  Diagnosis Date  . Anxiety   . Asthma    Albuterol prn   . CHF (congestive heart failure) (HCC)    takes Furosemide daily  . Chronic back pain    DDD/scoliosis  . Complication of anesthesia    hard to wake up  . COPD (chronic obstructive pulmonary disease) (HCC)   . DDD (degenerative disc disease)   . Depression    takes Celexa,Clonazepam daily  . Diabetes mellitus    borderline  . Diverticulitis   . Edema of lower extremity   . Emphysema lung (HCC)   . Fibromyalgia   . Gastritis   . GERD (gastroesophageal reflux disease)    takes Pepcid daily  . Gout   . Gout of big toe    RIGHT  . Hemorrhoids   . Hiatal hernia   . History of bronchitis    last time 4-455months ago   . History of colon polyps   . History of kidney stones    is in her left kidney and has been for couple of yrs and no problems  . HTN (hypertension)    takes lisinopril daily  . Hyperlipidemia    but doesn't take any meds  . Hypothyroidism    takes Synthroid daily  . Insomnia    doesn't take any meds for this  . Joint pain   . Joint swelling   . Migraine    last 04/04/18  . Migraine   . MS (  multiple sclerosis) (HCC)    early stages  . Muscle spasm    takes Flexeril daily as needed  . Neuropathy   . Osteoporosis   . Phlebitis   . Pneumonia    hx of;last time in 2014  . PONV (postoperative nausea and vomiting)   . Shortness of breath    with exertion or stressed   . Sleep apnea    sleep study done at least 3463yrs ago;has CPAP but doesn't use  . Stroke [facility](HCC)    left sided weakness  . TIA (transient ischemic attack)    Hx: of  . Urinary frequency     Past Surgical History:  Procedure Laterality Date  . ABDOMINAL HYSTERECTOMY    . ANTERIOR CERVICAL DECOMP/DISCECTOMY FUSION N/A 12/30/2013   Procedure: ANTERIOR CERVICAL DISCECTOMY FUSION  C4-5, C5-6, plate and screws, allograft, local bone graft;  Surgeon: Kerrin ChampagneJames E Nitka, MD;  Location: [facility] OR;  Service: Orthopedics;  Laterality: N/A;  . BACK SURGERY     lumbar discectomy  . BIOPSY N/A 01/20/2015   Procedure: COLON BIOPSY;  Surgeon: West BaliSandi L Fields, MD;  Location: [facility] ORS;  Service: Endoscopy;  Laterality: N/A;  . BIOPSY  05/22/2018   Procedure: BIOPSY;  Surgeon: West BaliFields, Sandi L, MD;  Location: [facility] ENDO SUITE;  Service: Endoscopy;;  gastric  . BLADDER SUSPENSION    . CARDIAC CATHETERIZATION    . CARPAL TUNNEL RELEASE Right   . CHOLECYSTECTOMY    . COLONOSCOPY    . COLONOSCOPY WITH PROPOFOL N/A 01/20/2015   SLF: 1. Normal ileum 2. Redundant left colon 3. Smalll internal hemorrhoids.   . ESOPHAGOGASTRODUODENOSCOPY  10/29/07   normal  . ESOPHAGOGASTRODUODENOSCOPY (EGD) WITH PROPOFOL N/A 05/22/2018   Procedure: ESOPHAGOGASTRODUODENOSCOPY (EGD) WITH PROPOFOL;  Surgeon: West BaliFields, Sandi L, MD;  Location: [facility] ENDO SUITE;  Service: Endoscopy;  Laterality: N/A;  9:30am  . fundic gland polyp     benign  . POLYPECTOMY N/A 01/20/2015   Procedure: RECTAL POLYPECTOMY;  Surgeon: West BaliSandi L Fields, MD;  Location: [facility] ORS;  Service: Endoscopy;  Laterality: N/A;  . SIGMOIDOSCOPY  01/31/08   large internal hemorrhoids/small rectal polyp removed/rare sigmoid diverticula     reports that she has been smoking cigarettes. She has a 11.25 pack-year smoking history. She has never used smokeless tobacco. She reports that she does not drink alcohol or use drugs.  Allergies  Allergen Reactions  . Demerol [Meperidine] Anaphylaxis and Swelling  . Imitrex [Sumatriptan] Anaphylaxis  . Penicillins Anaphylaxis and Swelling    Has patient had a PCN reaction causing immediate rash, facial/tongue/throat swelling, SOB or lightheadedness with hypotension: Yes Has patient had a PCN reaction causing severe rash involving mucus membranes or skin necrosis: No Has patient had a PCN reaction that required hospitalization Yes  Has patient had a PCN reaction occurring within the last 10 years: No If all of the above answers are "NO", then may proceed with Cephalosporin use.   . Sulfa Antibiotics Anaphylaxis  . Abilify [Aripiprazole] Nausea And Vomiting  . Levaquin [Levofloxacin In D5w]     Abdominal pain  . Neurontin [Gabapentin] Other (See Comments)    Abdominal pain  . Toradol [Ketorolac Tromethamine] Other (See Comments)    "Feels like my whole body is on fire."  . Tramadol Other (See Comments)    Stomach upset   . Ceclor [Cefaclor] Nausea Only  . Doxycycline Nausea And Vomiting         Family History  Problem Relation Age of Onset  .  Diabetes Mother   . Hypertension Mother   . Arthritis Father   . Diabetes Sister   . Neuropathy Sister   . Transient ischemic attack Sister   . Heart disease Sister   . Hypertension Sister   . Cancer - Lung Other   . Cancer - Prostate Other   . Cancer - Other Other   . Colon cancer Paternal Uncle   . Colon cancer Brother        deceased age 60 from colon CA and brain CA   Prior to Admission medications   Medication Sig Start Date End Date Taking? Authorizing Provider  albuterol (PROVENTIL) (5 MG/ML) 0.5% nebulizer solution Take 1 mL (5 mg total) by nebulization every 6 (six) hours as needed for wheezing or shortness of breath. Patient taking differently: Take 5 mg by nebulization 2 (two) times daily.  08/26/17   Burgess AmorIdol, Julie, PA-C  amLODipine (NORVASC) 10 MG tablet Take 1 tablet (10 mg total) by mouth daily. 11/15/17   Aliene BeamsHagler, Rachel, MD  Aspirin-Acetaminophen-Caffeine (GOODY HEADACHE PO) Take 1 Package by mouth daily as needed (headaches).     [provider]  bismuth subsalicylate (PEPTO BISMOL) 262 MG chewable tablet Chew 524 mg by mouth as needed for indigestion or diarrhea or loose stools.    [provider]  ciprofloxacin (CIPRO) 500 MG tablet Take 1 tablet (500 mg total) by mouth 2 (two) times daily. 02/11/19   Gilda CreasePollina, Christopher J, MD   citalopram (CELEXA) 40 MG tablet Take 40 mg by mouth daily.    [provider]  clonazePAM (KLONOPIN) 0.5 MG tablet Take 1 tablet (0.5 mg total) by mouth 3 (three) times daily as needed for anxiety. 11/02/17   Aliene BeamsHagler, Rachel, MD  cyclobenzaprine (FLEXERIL) 10 MG tablet Take 1 tablet (10 mg total) by mouth 3 (three) times daily as needed for muscle spasms. 02/16/18   Aliene BeamsHagler, Rachel, MD  famotidine (PEPCID) 20 MG tablet Take 2 tablets (40 mg total) by mouth daily. 04/08/15   Black, Lesle ChrisKaren M, NP  fluticasone furoate-vilanterol (BREO ELLIPTA) 200-25 MCG/INH AEPB Inhale 1 puff into the lungs daily. 02/16/18   Aliene BeamsHagler, Rachel, MD  furosemide (LASIX) 20 MG tablet Take 0.5 tablets (10 mg total) by mouth daily. Patient taking differently: Take 20 mg by mouth daily as needed for fluid or edema.  11/15/17   Aliene BeamsHagler, Rachel, MD  ibuprofen (ADVIL,MOTRIN) 800 MG tablet Take 800 mg by mouth 3 (three) times daily. 12/15/15   [provider]  levothyroxine (SYNTHROID, LEVOTHROID) 150 MCG tablet Take 1 tablet (150 mcg total) by mouth daily. 04/05/18   Fields, Darleene CleaverSandi L, MD  lisinopril (PRINIVIL,ZESTRIL) 40 MG tablet Take 1 tablet (40 mg total) by mouth daily. 02/16/18   Aliene BeamsHagler, Rachel, MD  metroNIDAZOLE (FLAGYL) 500 MG tablet Take 1 tablet (500 mg total) by mouth 3 (three) times daily. 02/11/19   Gilda CreasePollina, Christopher J, MD  oxymetazoline (AFRIN) 0.05 % nasal spray Place 1 spray into both nostrils daily as needed for congestion.    [provider]  pantoprazole (PROTONIX) 40 MG tablet Take 1 tablet (40 mg total) by mouth 2 (two) times daily before a meal. 05/22/18   Fields, Darleene CleaverSandi L, MD  promethazine (PHENERGAN) 25 MG tablet Take 25 mg by mouth 3 (three) times daily. 12/15/15   [provider]  SUBOXONE 8-2 MG FILM Place 1 Film under the tongue 3 (three) times daily.  12/29/15   [provider]  tiotropium (SPIRIVA HANDIHALER) 18 MCG inhalation capsule Place  1 capsule (18 mcg total) daily into  inhaler and inhale. 09/11/17   Caren Macadam, MD  traZODone (DESYREL) 150 MG tablet Take 150 mg by mouth at bedtime.  12/15/15   [provider]    Physical Exam: Vitals:   05/17/19 0515 05/17/19 0530 05/17/19 0545 05/17/19 0600  BP: (!) 104/59 (!) 106/59 (!) 111/56 108/80  Pulse: 69 66 65 67  Resp: 19 17 19 19   Temp:      TempSrc:      SpO2: 100% 99% 96% 100%    Constitutional: NAD, calm, comfortable Eyes: PERRL, lids and conjunctivae are mildly injected. ENMT: Mucous membranes are mildly dry. Posterior pharynx clear of any exudate or lesions. Neck: normal, supple, no masses, no thyromegaly Respiratory: clear to auscultation bilaterally, no wheezing, no crackles. Normal respiratory effort. No accessory muscle use.  Cardiovascular: Regular rate and rhythm, no murmurs / rubs / gallops. No extremity edema. 2+ pedal pulses. No carotid bruits.  Abdomen: Soft, no tenderness, no masses palpated. No hepatosplenomegaly. Bowel sounds positive.  Musculoskeletal: no clubbing / cyanosis. Good ROM, no contractures. Normal muscle tone.  Skin: no rashes, lesions, ulcers on limited dermatological examination. Neurologic: CN 2-12 grossly intact. Sensation intact, DTR normal. Strength 5/5 in all 4.  Psychiatric:  Alert and oriented x 2,confused at times, does not follow or does not understand instructions during examination and very slow to answer questions.  Labs on Admission: I have personally reviewed following labs and imaging studies  CBC: Recent Labs  Lab 05/17/19 0446  WBC 7.0  NEUTROABS 3.8  HGB 13.2  HCT 40.5  MCV 90.0  PLT 161   Basic Metabolic Panel: Recent Labs  Lab 05/17/19 0446  NA 141  K 4.9  CL 108  CO2 23  GLUCOSE 103*  BUN 39*  CREATININE 2.62*  CALCIUM 9.6   GFR: CrCl cannot be calculated (Unknown ideal weight.). Liver Function Tests: Recent Labs  Lab 05/17/19 0446  AST 12*  ALT 16  ALKPHOS 81  BILITOT 0.2*  PROT 7.0  ALBUMIN 4.2   No results  for input(s): LIPASE, AMYLASE in the last 168 hours. No results for input(s): AMMONIA in the last 168 hours. Coagulation Profile: No results for input(s): INR, PROTIME in the last 168 hours. Cardiac Enzymes: Recent Labs  Lab 05/17/19 0446  CKTOTAL 87   BNP (last 3 results) No results for input(s): PROBNP in the last 8760 hours. HbA1C: No results for input(s): HGBA1C in the last 72 hours. CBG: Recent Labs  Lab 05/17/19 0439  GLUCAP 117*   Lipid Profile: No results for input(s): CHOL, HDL, LDLCALC, TRIG, CHOLHDL, LDLDIRECT in the last 72 hours. Thyroid Function Tests: No results for input(s): TSH, T4TOTAL, FREET4, T3FREE, THYROIDAB in the last 72 hours. Anemia Panel: No results for input(s): VITAMINB12, FOLATE, FERRITIN, TIBC, IRON, RETICCTPCT in the last 72 hours. Urine analysis:    Component Value Date/Time   COLORURINE YELLOW 05/17/2019 0509   APPEARANCEUR CLEAR 05/17/2019 0509   LABSPEC 1.012 05/17/2019 0509   PHURINE 5.0 05/17/2019 0509   GLUCOSEU NEGATIVE 05/17/2019 0509   HGBUR NEGATIVE 05/17/2019 0509   BILIRUBINUR NEGATIVE 05/17/2019 0509   BILIRUBINUR neg 11/23/2017 1227   KETONESUR NEGATIVE 05/17/2019 0509   PROTEINUR NEGATIVE 05/17/2019 0509   UROBILINOGEN 0.2 11/23/2017 1227   UROBILINOGEN 0.2 06/04/2015 0115   NITRITE NEGATIVE 05/17/2019 0509   LEUKOCYTESUR NEGATIVE 05/17/2019 0509    Radiological Exams on Admission: No results found.  EKG: Independently reviewed. Vent. rate 76 BPM  PR interval * ms QRS duration 92 ms QT/QTc 381/429 ms P-R-T axes 81 75 62 Sinus rhythm Baseline wander in lead(s) V1 No significant change since last tracing 01 Mar 2018.  Assessment/Plan Principal Problem:   Baclofen overdose Observation/stepdown. Close cardiac telemetry monitoring. Continue IV fluids. Neuro checks every 4 hours. Consider [facility] evaluation.  Active Problems:   AKI (acute kidney injury) (HCC) Continue IV fluids. Monitor intake and output.  Follow-up renal function and electrolytes. Hold ACE inhibitor and diuretic.   Hypertension Hold lisinopril. Hold furosemide. Continue amlodipine once med reconciliation done. Monitor blood pressure.    GERD (gastroesophageal reflux disease) Pantoprazole 40 mg p.o. daily.    COPD (chronic obstructive pulmonary disease) (HCC) Asymptomatic at this time. Supplemental oxygen and bronchodilators as needed.    Hyperlipidemia Currently not on medical treatment. Will need med reconciliation.    Hypothyroidism Continue levothyroxine 150 mcg p.o. daily. Will need dose confirmation.    Grade I diastolic dysfunction No signs of volume overload. Hold ACE inhibitor and loop diuretic. Given relatively high sodium will hydrate with 0.45% NaCl.    Anxiety and depression Will need med reconciliation of antidepressant and anxiolytic.   DVT prophylaxis: Lovenox SQ. Code Status: Full code. Family Communication:  Disposition Plan: Observation for IV hydration and close monitoring. Consults called: Admission status: Observation/telemetry.   Bobette Mo MD [facility]  05/17/2019, 6:14 AM   This document was prepared using Dragon voice recognition software and may contain some unintended transcription errors.

## 2019-05-17 NOTE — ED Triage Notes (Signed)
Per pt daughter, pt may have taken (6) baclofen tablets that she borrowed from her sister for her back pain.  Family found pt this morning not able to respond appropriately.  Pt is awake and alert, is not answering questions.

## 2019-05-17 NOTE — ED Notes (Signed)
Per Poison Control:  Most likely prolonged sedation, watch for prolonged QTC and PR,  PVC's possible.  Possible hyper or hypotension.  Possible seizures.

## 2019-05-17 NOTE — Progress Notes (Signed)
Upon admission to the ICU pt was A&O x4 patient able to answer all admission question approprietly. She did ask why she was here I reassured her it was for use to observe her make sure everything was okay. Pt then became drowsy and went to sleep and wouldn't wake up to touch or voice. Patient was snoring loudly and vital signs was stable. Pt will awake to go to the bathroom but then will go right back to sleep. Will continue to monitor patient

## 2019-05-17 NOTE — ED Provider Notes (Signed)
[facility] EMERGENCY DEPARTMENT Provider Note   [name]: [id number] Arrival date & time: 05/17/19  0405  Time Seen 05:00 AM  History   Chief Complaint Chief Complaint  Patient presents with   Drug Overdose   Level 5 caveat for altered mental status  HPI [name] is a 58 y.o. female.     HPI   daughter states that she saw her aunt give her mother 6 baclofen for her complaints of back pain.  She does not know how the patient took the pills.  However later in the evening has been noted that she was very difficult to awakened and they brought her to the ED.  Patient does not answer verbally and is very slow to respond.  PCP Roger KillWilliams, Breejante J, PA-C   Past Medical History:  Diagnosis Date   Anxiety    Asthma    Albuterol prn    CHF (congestive heart failure) (HCC)    takes Furosemide daily   Chronic back pain    DDD/scoliosis   Complication of anesthesia    hard to wake up   COPD (chronic obstructive pulmonary disease) (HCC)    DDD (degenerative disc disease)    Depression    takes Celexa,Clonazepam daily   Diabetes mellitus    borderline   Diverticulitis    Edema of lower extremity    Emphysema lung (HCC)    Fibromyalgia    Gastritis    GERD (gastroesophageal reflux disease)    takes Pepcid daily   Gout    Gout of big toe    RIGHT   Hemorrhoids    Hiatal hernia    History of bronchitis    last time 4-645months ago    History of colon polyps    History of kidney stones    is in her left kidney and has been for couple of yrs and no problems   HTN (hypertension)    takes lisinopril daily   Hyperlipidemia    but doesn't take any meds   Hypothyroidism    takes Synthroid daily   Insomnia    doesn't take any meds for this   Joint pain    Joint swelling    Migraine    last 04/04/18   Migraine    MS (multiple sclerosis) (HCC)    early stages   Muscle spasm    takes Flexeril daily as needed   Neuropathy     Osteoporosis    Phlebitis    Pneumonia    hx of;last time in 2014   PONV (postoperative nausea and vomiting)    Shortness of breath    with exertion or stressed    Sleep apnea    sleep study done at least 1759yrs ago;has CPAP but doesn't use   Stroke [facility](HCC)    left sided weakness   TIA (transient ischemic attack)    Hx: of   Urinary frequency     Patient Active Problem List   Diagnosis Date Noted   Baclofen overdose 05/17/2019   Abdominal pain, chronic, epigastric 02/05/2018   Nausea with vomiting 02/05/2018   Fibromyalgia 11/02/2017   Feeling of chest tightness 04/12/2015   SOB (shortness of breath) 04/12/2015   Hyperglycemia 04/08/2015   Tobacco use disorder 04/08/2015   Headache    Left-sided weakness    Hyperlipidemia 04/06/2015   Migraine 04/05/2015   Acute CVA (cerebrovascular accident) (HCC) 04/05/2015   Acute blood loss anemia    Hematochezia 11/15/2014   Bilateral lower  abdominal cramping 11/15/2014   Rectal bleeding 11/15/2014   Colitis, acute 11/15/2014   Malnutrition of moderate degree (HCC) 04/17/2014   Postoperative anemia due to acute blood loss 04/17/2014    Class: Acute   Malnourished (HCC) 04/17/2014    Class: Present on Admission   Spinal stenosis, lumbar region, 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    Class: Chronic   Spondylolisthesis at L4-L5 level 04/14/2014   Spondylosis of cervical spine 12/30/2013   Cervical spondylosis without myelopathy 12/30/2013    Class: Chronic   Spondylolisthesis of lumbar region 12/30/2013    Class: Chronic   CAP (community acquired pneumonia) 08/24/2013   GERD (gastroesophageal reflux disease) 08/24/2013   COPD (chronic obstructive pulmonary disease) (HCC) 08/24/2013   Chronic pain syndrome 08/24/2013   Cigarette smoker 08/24/2013   Chest pain 08/24/2013   COPD exacerbation (HCC) 08/11/2012   DM (diabetes mellitus) (HCC) 08/11/2012   Leucocytosis 08/11/2012    Hypertension 08/11/2012    Past Surgical History:  Procedure Laterality Date   ABDOMINAL HYSTERECTOMY     ANTERIOR CERVICAL DECOMP/DISCECTOMY FUSION N/A 12/30/2013   Procedure: ANTERIOR CERVICAL DISCECTOMY FUSION C4-5, C5-6, plate and screws, allograft, local bone graft;  Surgeon: Kerrin Champagne, MD;  Location: [facility] OR;  Service: Orthopedics;  Laterality: N/A;   BACK SURGERY     lumbar discectomy   BIOPSY N/A 01/20/2015   Procedure: COLON BIOPSY;  Surgeon: West Bali, MD;  Location: [facility] ORS;  Service: Endoscopy;  Laterality: N/A;   BIOPSY  05/22/2018   Procedure: BIOPSY;  Surgeon: West Bali, MD;  Location: [facility] ENDO SUITE;  Service: Endoscopy;;  gastric   BLADDER SUSPENSION     CARDIAC CATHETERIZATION     CARPAL TUNNEL RELEASE Right    CHOLECYSTECTOMY     COLONOSCOPY     COLONOSCOPY WITH PROPOFOL N/A 01/20/2015   SLF: 1. Normal ileum 2. Redundant left colon 3. Smalll internal hemorrhoids.    ESOPHAGOGASTRODUODENOSCOPY  10/29/07   normal   ESOPHAGOGASTRODUODENOSCOPY (EGD) WITH PROPOFOL N/A 05/22/2018   Procedure: ESOPHAGOGASTRODUODENOSCOPY (EGD) WITH PROPOFOL;  Surgeon: West Bali, MD;  Location: [facility] ENDO SUITE;  Service: Endoscopy;  Laterality: N/A;  9:30am   fundic gland polyp     benign   POLYPECTOMY N/A 01/20/2015   Procedure: RECTAL POLYPECTOMY;  Surgeon: West Bali, MD;  Location: [facility] ORS;  Service: Endoscopy;  Laterality: N/A;   SIGMOIDOSCOPY  01/31/08   large internal hemorrhoids/small rectal polyp removed/rare sigmoid diverticula     OB History    Gravida  2   Para  2   Term  0   Preterm  2   AB      Living  2     SAB      TAB      Ectopic      Multiple      Live Births               Home Medications    Prior to Admission medications   Medication Sig Start Date End Date Taking? Authorizing Provider  albuterol (PROVENTIL) (5 MG/ML) 0.5% nebulizer solution Take 1 mL (5 mg total) by nebulization every 6 (six) hours as needed  for wheezing or shortness of breath. Patient taking differently: Take 5 mg by nebulization 2 (two) times daily.  08/26/17   Burgess Amor, PA-C  amLODipine (NORVASC) 10 MG tablet Take 1 tablet (10 mg total) by mouth daily. 11/15/17   Aliene Beams, MD  Aspirin-Acetaminophen-Caffeine (GOODY HEADACHE PO) Take  1 Package by mouth daily as needed (headaches).     [provider]  bismuth subsalicylate (PEPTO BISMOL) 262 MG chewable tablet Chew 524 mg by mouth as needed for indigestion or diarrhea or loose stools.    [provider]  ciprofloxacin (CIPRO) 500 MG tablet Take 1 tablet (500 mg total) by mouth 2 (two) times daily. 02/11/19   Orpah Greek, MD  citalopram (CELEXA) 40 MG tablet Take 40 mg by mouth daily.    [provider]  clonazePAM (KLONOPIN) 0.5 MG tablet Take 1 tablet (0.5 mg total) by mouth 3 (three) times daily as needed for anxiety. 11/02/17   Caren Macadam, MD  cyclobenzaprine (FLEXERIL) 10 MG tablet Take 1 tablet (10 mg total) by mouth 3 (three) times daily as needed for muscle spasms. 02/16/18   Caren Macadam, MD  famotidine (PEPCID) 20 MG tablet Take 2 tablets (40 mg total) by mouth daily. 04/08/15   Black, Lezlie Octave, NP  fluticasone furoate-vilanterol (BREO ELLIPTA) 200-25 MCG/INH AEPB Inhale 1 puff into the lungs daily. 02/16/18   Caren Macadam, MD  furosemide (LASIX) 20 MG tablet Take 0.5 tablets (10 mg total) by mouth daily. Patient taking differently: Take 20 mg by mouth daily as needed for fluid or edema.  11/15/17   Caren Macadam, MD  ibuprofen (ADVIL,MOTRIN) 800 MG tablet Take 800 mg by mouth 3 (three) times daily. 12/15/15   [provider]  levothyroxine (SYNTHROID, LEVOTHROID) 150 MCG tablet Take 1 tablet (150 mcg total) by mouth daily. 04/05/18   Fields, Marga Melnick, MD  lisinopril (PRINIVIL,ZESTRIL) 40 MG tablet Take 1 tablet (40 mg total) by mouth daily. 02/16/18   Caren Macadam, MD  metroNIDAZOLE (FLAGYL) 500 MG tablet Take 1 tablet (500 mg  total) by mouth 3 (three) times daily. 02/11/19   Orpah Greek, MD  oxymetazoline (AFRIN) 0.05 % nasal spray Place 1 spray into both nostrils daily as needed for congestion.    [provider]  pantoprazole (PROTONIX) 40 MG tablet Take 1 tablet (40 mg total) by mouth 2 (two) times daily before a meal. 05/22/18   Fields, Marga Melnick, MD  promethazine (PHENERGAN) 25 MG tablet Take 25 mg by mouth 3 (three) times daily. 12/15/15   [provider]  SUBOXONE 8-2 MG FILM Place 1 Film under the tongue 3 (three) times daily.  12/29/15   [provider]  tiotropium (SPIRIVA HANDIHALER) 18 MCG inhalation capsule Place 1 capsule (18 mcg total) daily into inhaler and inhale. 09/11/17   Caren Macadam, MD  traZODone (DESYREL) 150 MG tablet Take 150 mg by mouth at bedtime.  12/15/15   [provider]    Family History Family History  Problem Relation Age of Onset   Diabetes Mother    Hypertension Mother    Arthritis Father    Diabetes Sister    Neuropathy Sister    Transient ischemic attack Sister    Heart disease Sister    Hypertension Sister    Cancer - Lung Other    Cancer - Prostate Other    Cancer - Other Other    Colon cancer Paternal Uncle    Colon cancer Brother        deceased age 103 from colon CA and brain CA    Social History Social History   Tobacco Use   Smoking status: Current Some Day Smoker    Packs/day: 0.75    Years: 15.00    Pack years: 11.25    Types: Cigarettes  Smokeless tobacco: Never Used  Substance Use Topics   Alcohol use: No   Drug use: No  lives with spouse   Allergies   Demerol [meperidine], Imitrex [sumatriptan], Penicillins, Sulfa antibiotics, Abilify [aripiprazole], Levaquin [levofloxacin in d5w], Neurontin [gabapentin], Toradol [ketorolac tromethamine], Tramadol, Ceclor [cefaclor], and Doxycycline   Review of Systems Review of Systems  All other systems reviewed and are negative.    Physical  Exam Updated Vital Signs BP 108/80    Pulse 67    Temp 98 F (36.7 C) (Oral)    Resp 19    SpO2 100%   Physical Exam Vitals signs and nursing note reviewed.  Constitutional:      Comments: Patient has sonorous respirations  HENT:     Head: Normocephalic and atraumatic.     Right Ear: External ear normal.     Left Ear: External ear normal.     Nose: Nose normal.     Mouth/Throat:     Mouth: Mucous membranes are dry.     Comments: Edentulous Eyes:     Extraocular Movements: Extraocular movements intact.     Pupils: Pupils are equal, round, and reactive to light.  Neck:     Musculoskeletal: Normal range of motion.  Cardiovascular:     Rate and Rhythm: Normal rate.     Pulses: Normal pulses.     Heart sounds: Normal heart sounds.  Pulmonary:     Breath sounds: No stridor. No wheezing, rhonchi or rales.     Comments: Patient has sonorous breathing Musculoskeletal: Normal range of motion.        General: No deformity.  Skin:    General: Skin is warm and dry.     Findings: No erythema or rash.  Neurological:     Comments: When I first do a hand drop over her face she hits her face however on the second attempt she holds her hand away from hitting her face.  Patient does not respond verbally.  She does not open her eyes to verbal commands.  Psychiatric:     Comments: Unable to assess      ED Treatments / Results  Labs (all labs ordered are listed, but only abnormal results are displayed) Results for orders placed or performed during the hospital encounter of 05/17/19  CBC with Differential  Result Value Ref Range   WBC 7.0 4.0 - 10.5 K/uL   RBC 4.50 3.87 - 5.11 MIL/uL   Hemoglobin 13.2 12.0 - 15.0 g/dL   HCT 78.2 95.6 - 21.3 %   MCV 90.0 80.0 - 100.0 fL   MCH 29.3 26.0 - 34.0 pg   MCHC 32.6 30.0 - 36.0 g/dL   RDW 08.6 57.8 - 46.9 %   Platelets 191 150 - 400 K/uL   nRBC 0.0 0.0 - 0.2 %   Neutrophils Relative % 54 %   Neutro Abs 3.8 1.7 - 7.7 K/uL   Lymphocytes  Relative 31 %   Lymphs Abs 2.2 0.7 - 4.0 K/uL   Monocytes Relative 9 %   Monocytes Absolute 0.7 0.1 - 1.0 K/uL   Eosinophils Relative 5 %   Eosinophils Absolute 0.3 0.0 - 0.5 K/uL   Basophils Relative 1 %   Basophils Absolute 0.1 0.0 - 0.1 K/uL   Immature Granulocytes 0 %   Abs Immature Granulocytes 0.02 0.00 - 0.07 K/uL  Salicylate level  Result Value Ref Range   Salicylate Lvl <7.0 2.8 - 30.0 mg/dL  Acetaminophen level  Result Value Ref Range  Acetaminophen (Tylenol), Serum <10 (L) 10 - 30 ug/mL  Ethanol  Result Value Ref Range   Alcohol, Ethyl (B) <10 <10 mg/dL  Comprehensive metabolic panel  Result Value Ref Range   Sodium 141 135 - 145 mmol/L   Potassium 4.9 3.5 - 5.1 mmol/L   Chloride 108 98 - 111 mmol/L   CO2 23 22 - 32 mmol/L   Glucose, Bld 103 (H) 70 - 99 mg/dL   BUN 39 (H) 6 - 20 mg/dL   Creatinine, Ser 1.61 (H) 0.44 - 1.00 mg/dL   Calcium 9.6 8.9 - 09.6 mg/dL   Total Protein 7.0 6.5 - 8.1 g/dL   Albumin 4.2 3.5 - 5.0 g/dL   AST 12 (L) 15 - 41 U/L   ALT 16 0 - 44 U/L   Alkaline Phosphatase 81 38 - 126 U/L   Total Bilirubin 0.2 (L) 0.3 - 1.2 mg/dL   GFR calc non Af Amer 19 (L) >60 mL/min   GFR calc Af Amer 22 (L) >60 mL/min   Anion gap 10 5 - 15  Urinalysis, Routine w reflex microscopic  Result Value Ref Range   Color, Urine YELLOW YELLOW   APPearance CLEAR CLEAR   Specific Gravity, Urine 1.012 1.005 - 1.030   pH 5.0 5.0 - 8.0   Glucose, UA NEGATIVE NEGATIVE mg/dL   Hgb urine dipstick NEGATIVE NEGATIVE   Bilirubin Urine NEGATIVE NEGATIVE   Ketones, ur NEGATIVE NEGATIVE mg/dL   Protein, ur NEGATIVE NEGATIVE mg/dL   Nitrite NEGATIVE NEGATIVE   Leukocytes,Ua NEGATIVE NEGATIVE  Urine rapid drug screen (hosp performed)  Result Value Ref Range   Opiates NONE DETECTED NONE DETECTED   Cocaine NONE DETECTED NONE DETECTED   Benzodiazepines POSITIVE (A) NONE DETECTED   Amphetamines NONE DETECTED NONE DETECTED   Tetrahydrocannabinol NONE DETECTED NONE DETECTED     Barbiturates NONE DETECTED NONE DETECTED  CK  Result Value Ref Range   Total CK 87 38 - 234 U/L  CBG monitoring, ED  Result Value Ref Range   Glucose-Capillary 117 (H) 70 - 99 mg/dL   Laboratory interpretation all normal except new renal insufficiency.    EKG EKG Interpretation  Date/Time:  Friday May 17 2019 04:14:45 EDT Ventricular Rate:  76 PR Interval:    QRS Duration: 92 QT Interval:  381 QTC Calculation: 429 R Axis:   75 Text Interpretation:  Sinus rhythm Baseline wander in lead(s) V1 No significant change since last tracing 01 Mar 2018 Confirmed by Devoria Albe ([id number]) on 05/17/2019 5:36:50 AM   Radiology No results found.  Procedures .Critical Care Performed by: Devoria Albe, MD Authorized by: Devoria Albe, MD   Critical care provider statement:    Critical care time (minutes):  36   Critical care was necessary to treat or prevent imminent or life-threatening deterioration of the following conditions:  Respiratory failure   Critical care was time spent personally by me on the following activities:  Discussions with consultants, evaluation of patient's response to treatment, examination of patient, ordering and performing treatments and interventions, ordering and review of laboratory studies, pulse oximetry, re-evaluation of patient's condition, obtaining history from patient or surrogate and review of old charts   (including critical care time)  Medications Ordered in ED Medications  0.9 %  sodium chloride infusion (has no administration in time range)  sodium chloride 0.9 % bolus 1,000 mL (1,000 mLs Intravenous New Bag/Given 05/17/19 0559)     Initial Impression / Assessment and Plan / ED Course  I  have reviewed the triage vital signs and the nursing notes.  Pertinent labs & imaging results that were available during my care of the patient were reviewed by me and considered in my medical decision making (see chart for details).      5:45 AM patient was  discussed with poison control, Caryn BeeKevin.  They state that baclofen can even present as a deep coma, when I [place] patient is on #42 buprenorphine/naloxone 8/2 mg sublingual film tablets prescribed every 2 weeks, she has been prescribed clonazepam but her last prescription was number 90 tablets clonazepam 0.5 mg on February 25.  I think may be a combination of her medications has really depressed her mental status with the baclofen.  I had secretary staff call respiratory to come down for intubation.  Review of patient's labs shows since April of this year she now has a significant new renal insufficiency.  Her UDS is positive for benzos.  5:55 AM patient is now awake and sitting up talking to nursing staff.  I will talk to the hospitalist about admitting her for her new renal insufficiency.  CK was added to her blood work to look for reason for her renal insufficiency.  She was started on IV fluids.  6:07 AM Dr. Robb Matarrtiz, hospitalist will admit  [facility] shows patient gets #42 buprenorphine/naloxone 8/2 mg SL films every 2 weeks, last filled June 30.  She also last had #90 clonazepam 0.5 mg tablets filled on February 25.  6:30 AM patient is awake and talking.  She states she only took 1 of the baclofen.  She states she only took her usual dose of the buprenorphine/naloxone.  She denies taking any type of benzos even when I told her it shows up in her urine.  Final Clinical Impressions(s) / ED Diagnoses   Final diagnoses:  Accidental drug overdose, initial encounter  Acute kidney injury [facility](HCC)    Plan admission  Devoria AlbeIva Parnika Tweten, MD, Concha PyoFACEP    Joshlynn Alfonzo, MD 05/17/19 [phone]

## 2019-05-17 NOTE — ED Notes (Signed)
Pt now A&O x3. Pt states she only took one baclofen and that the rest are in her purse. Pt states that she did not take anything else tonight.

## 2019-05-18 ENCOUNTER — Encounter (HOSPITAL_COMMUNITY): Payer: Self-pay

## 2019-05-18 LAB — BASIC METABOLIC PANEL
Anion gap: 5 (ref 5–15)
BUN: 17 mg/dL (ref 6–20)
CO2: 23 mmol/L (ref 22–32)
Calcium: 8.2 mg/dL — ABNORMAL LOW (ref 8.9–10.3)
Chloride: 111 mmol/L (ref 98–111)
Creatinine, Ser: 0.84 mg/dL (ref 0.44–1.00)
GFR calc Af Amer: >60 mL/min (ref >60)
GFR calc non Af Amer: >60 mL/min (ref >60)
Glucose, Bld: 100 mg/dL — ABNORMAL HIGH (ref 70–99)
Potassium: 4.1 mmol/L (ref 3.5–5.1)
Sodium: 139 mmol/L (ref 135–145)

## 2019-05-18 LAB — MAGNESIUM: Magnesium: 1.6 mg/dL — ABNORMAL LOW (ref 1.7–2.4)

## 2019-05-18 LAB — HIV ANTIBODY (ROUTINE TESTING W REFLEX): HIV Screen 4th Generation wRfx: NONREACTIVE

## 2019-05-18 MED ORDER — CHLORHEXIDINE GLUCONATE CLOTH 2 % EX PADS
6.0000 | MEDICATED_PAD | Freq: Every day | CUTANEOUS | Status: DC
  Administered 2019-05-18: 6 via TOPICAL

## 2019-05-18 MED ORDER — MAGNESIUM SULFATE 2 GM/50ML IV SOLN
2.0000 g | Freq: Once | INTRAVENOUS | Status: AC
  Administered 2019-05-18: 2 g via INTRAVENOUS
  Filled 2019-05-18: qty 50

## 2019-05-18 MED ORDER — PROMETHAZINE HCL 25 MG/ML IJ SOLN
12.5000 mg | Freq: Once | INTRAMUSCULAR | Status: AC
  Administered 2019-05-18: 12.5 mg via INTRAVENOUS
  Filled 2019-05-18: qty 1

## 2019-05-18 NOTE — Discharge Summary (Signed)
Physician Discharge Summary  [name] ZOX:[id number] DOB: 11/16/1960 DOA: 05/17/2019  PCP: Roger KillWilliams, Breejante J, PA-C  Admit date: 05/17/2019  Discharge date: 05/18/2019  Admitted From:Home  Disposition:  Home  Recommendations for Outpatient Follow-up:  1. Follow up with PCP in 1-2 weeks 2. Follow-up with urology, Dr. Laverle PatterBorden.  Sent a staff message.  Should be able to schedule in the next 1 week and will call from office.  Needs follow-up regarding moderate right-sided hydronephrosis with associated AKI. 3. Repeat BMP in 1 week and continue take pain medications as prescribed.  Explained that some of her left-sided back and flank pain is likely related to nephrolithiasis on that side as well.  Home Health: None  Equipment/Devices: None  Discharge Condition: Stable  CODE STATUS: Full  Diet recommendation: Heart Healthy/carb modified  Brief/Interim Summary: Per HPI: Nichole L Taltonis a 58 y.o.femalewith medical history significant ofanxiety, depression, insomnia, asthma/COPD/emphysema, grade 1 diastolic dysfunction, chronic back pain, type 2 diabetes, fibromyalgia, gastritis, GERD/hiatal hernia, history of gout, hemorrhoids, colon polyps, urolithiasis, hypertension, hyperlipidemia, hypothyroidism, migraine headaches, unspecified neuropathy, sleep apnea, history of pneumonia, osteoporosis, history of CVA with left-sided hemiparesis, history of TIA who was brought to the emergency department due to being found unable to respond appropriately by family members after she took baclofen. The daughter stated that she may have taken up to 6 tablets, however the patient stated she only took 1. She was initially very altered and was a candidate for ET intubation, but her mental status has been improving steadily since then. The patient is more alert now, but still seems to react very slowly and seems to be disoriented at times. She is currently oriented x2and is able to converse.She is  partially oriented to date, but is disoriented to situation. She kept asking the same questionsmultiple times, particularly "what is going on?". She was unable to provide much on review of systems  Patient was admitted for acute toxic encephalopathy secondary to baclofen toxicity.  At this point, it is unclear whether or not patient actually overdosed on her medication or had systemic toxicity on account of her AKI which had developed on account of some mild dehydration as well as some noted right-sided hydronephrosis.  She is much better on the day of discharge and is alert and oriented x3.  She is back to her usual baseline and has received plenty of IV fluid and is now urinating quite well with no hematuria noted.  Her renal ultrasound does demonstrate mild to moderate right-sided hydronephrosis which will require further evaluation with urology outpatient.  Her FeNA is also 27.3% which suggests more of an obstructive cause to her AKI.  She is stable for discharge today and will follow up with her PCP in the next 1 week and continue her pain medications as previously prescribed.  She will require repeat BMP in the office to ensure stability and has been told to remain off of her home Lasix and continue to drink plenty of fluids.  Discharge Diagnoses:  Principal Problem:   Baclofen overdose Active Problems:   Hypertension   GERD (gastroesophageal reflux disease)   COPD (chronic obstructive pulmonary disease) (HCC)   Hyperlipidemia   Hypothyroidism   Grade I diastolic dysfunction   AKI (acute kidney injury) (HCC)   Anxiety and depression  Principal discharge diagnosis: Acute toxic encephalopathy secondary to baclofen with associated AKI-postobstructive.  Discharge Instructions  Discharge Instructions    Diet - low sodium heart healthy   Complete by: As directed  Increase activity slowly   Complete by: As directed      Allergies as of 05/18/2019      Reactions   Demerol  [meperidine] Anaphylaxis, Swelling   Imitrex [sumatriptan] Anaphylaxis   Penicillins Anaphylaxis, Swelling   Has patient had a PCN reaction causing immediate rash, facial/tongue/throat swelling, SOB or lightheadedness with hypotension: Yes Has patient had a PCN reaction causing severe rash involving mucus membranes or skin necrosis: No Has patient had a PCN reaction that required hospitalization Yes Has patient had a PCN reaction occurring within the last 10 years: No If all of the above answers are "NO", then may proceed with Cephalosporin use.   Sulfa Antibiotics Anaphylaxis   Abilify [aripiprazole] Nausea And Vomiting   Levaquin [levofloxacin In D5w]    Abdominal pain   Neurontin [gabapentin] Other (See Comments)   Abdominal pain   Toradol [ketorolac Tromethamine] Other (See Comments)   "Feels like my whole body is on fire."   Tramadol Other (See Comments)   Stomach upset   Ceclor [cefaclor] Nausea Only   Doxycycline Nausea And Vomiting          Medication List    STOP taking these medications   ciprofloxacin 500 MG tablet Commonly known as: Cipro   GOODY HEADACHE PO   metoprolol succinate 100 MG 24 hr tablet Commonly known as: TOPROL-XL     TAKE these medications   bismuth subsalicylate 262 MG chewable tablet Commonly known as: PEPTO BISMOL Chew 524 mg by mouth as needed for indigestion or diarrhea or loose stools.   citalopram 40 MG tablet Commonly known as: CELEXA Take 40 mg by mouth daily.   cyclobenzaprine 10 MG tablet Commonly known as: FLEXERIL Take 1 tablet (10 mg total) by mouth 3 (three) times daily as needed for muscle spasms.   levothyroxine 175 MCG tablet Commonly known as: SYNTHROID Take 175 mcg by mouth daily before breakfast.   traZODone 150 MG tablet Commonly known as: DESYREL Take 150 mg by mouth at bedtime.      Follow-up Information    Roger KillWilliams, Breejante J, PA-C Follow up in 1 week(s).   Specialty: Physician Assistant Contact  information: [address] [place] [id number] [phone]        Heloise PurpuraBorden, Lester, MD Follow up in 1 week(s).   Specialty: Urology Why: Should call to get appointment early next week for hydronephrosis. Contact information: [address] [place] [address] [phone]          Allergies  Allergen Reactions  . Demerol [Meperidine] Anaphylaxis and Swelling  . Imitrex [Sumatriptan] Anaphylaxis  . Penicillins Anaphylaxis and Swelling    Has patient had a PCN reaction causing immediate rash, facial/tongue/throat swelling, SOB or lightheadedness with hypotension: Yes Has patient had a PCN reaction causing severe rash involving mucus membranes or skin necrosis: No Has patient had a PCN reaction that required hospitalization Yes Has patient had a PCN reaction occurring within the last 10 years: No If all of the above answers are "NO", then may proceed with Cephalosporin use.   . Sulfa Antibiotics Anaphylaxis  . Abilify [Aripiprazole] Nausea And Vomiting  . Levaquin [Levofloxacin In D5w]     Abdominal pain  . Neurontin [Gabapentin] Other (See Comments)    Abdominal pain  . Toradol [Ketorolac Tromethamine] Other (See Comments)    "Feels like my whole body is on fire."  . Tramadol Other (See Comments)    Stomach upset   . Ceclor [Cefaclor] Nausea Only  .  Doxycycline Nausea And Vomiting         Consultations:  None   Procedures/Studies: [facility]  Result Date: 05/17/2019 CLINICAL DATA:  Acute kidney injury. EXAM: RENAL / URINARY TRACT ULTRASOUND COMPLETE COMPARISON:  Abdomen and pelvis CT dated 02/11/2019. FINDINGS: Right Kidney: Renal measurements: 9.8 x 5.8 x 5.5 cm = volume: 163 mL. Normal echotexture. Mild-to-moderate dilatation of the renal collecting system. No visible calculi or masses. Left Kidney: Renal measurements: 10.9 x 5.7 x 5.4 cm = volume: 177 mL. 6 mm calculus in the midportion of the kidney. No hydronephrosis. Bladder: Appears normal for degree  of bladder distention. IMPRESSION: 1. Mild-to-moderate right hydronephrosis. 2. 6 mm nonobstructing left renal calculus. Electronically Signed   By: Beckie SaltsSteven  Reid M.D.   On: 05/17/2019 09:07     Discharge Exam: Vitals:   05/17/19 2200 05/17/19 2323  BP:  (!) 144/63  Pulse: 74 73  Resp: 20 (!) 25  Temp:  98.1 F (36.7 C)  SpO2: 97% 98%   Vitals:   05/17/19 2000 05/17/19 2100 05/17/19 2200 05/17/19 2323  BP: 134/64   (!) 144/63  Pulse: 72 81 74 73  Resp: (!) 21 (!) 26 20 (!) 25  Temp: 97.8 F (36.6 C)   98.1 F (36.7 C)  TempSrc: Oral   Oral  SpO2: 98% 100% 97% 98%  Weight:      Height:        General: Pt is alert, awake, not in acute distress Cardiovascular: RRR, S1/S2 +, no rubs, no gallops Respiratory: CTA bilaterally, no wheezing, no rhonchi Abdominal: Soft, NT, ND, bowel sounds + Extremities: no edema, no cyanosis    The results of significant diagnostics from this hospitalization (including imaging, microbiology, ancillary and laboratory) are listed below for reference.     Microbiology: Recent Results (from the past 240 hour(s))  SARS Coronavirus 2 (CEPHEID - Performed in [facility] lab), Hosp Order     Status: None   Collection Time: 05/17/19  6:04 AM   Specimen: Nasopharyngeal Swab  Result Value Ref Range Status   SARS Coronavirus 2 NEGATIVE NEGATIVE Final    Comment: (NOTE) If result is NEGATIVE SARS-CoV-2 target nucleic acids are NOT DETECTED. The SARS-CoV-2 RNA is generally detectable in upper and lower  respiratory specimens during the acute phase of infection. The lowest  concentration of SARS-CoV-2 viral copies this assay can detect is 250  copies / mL. A negative result does not preclude SARS-CoV-2 infection  and should not be used as the sole basis for treatment or other  patient management decisions.  A negative result may occur with  improper specimen collection / handling, submission of specimen other  than nasopharyngeal swab,  presence of viral mutation(s) within the  areas targeted by this assay, and inadequate number of viral copies  (<250 copies / mL). A negative result must be combined with clinical  observations, patient history, and epidemiological information. If result is POSITIVE SARS-CoV-2 target nucleic acids are DETECTED. The SARS-CoV-2 RNA is generally detectable in upper and lower  respiratory specimens dur ing the acute phase of infection.  Positive  results are indicative of active infection with SARS-CoV-2.  Clinical  correlation with patient history and other diagnostic information is  necessary to determine patient infection status.  Positive results do  not rule out bacterial infection or co-infection with other viruses. If result is PRESUMPTIVE POSTIVE SARS-CoV-2 nucleic acids MAY BE PRESENT.   A presumptive positive result was obtained on the submitted specimen  and confirmed on repeat testing.  While 2019 novel coronavirus  (SARS-CoV-2) nucleic acids may be present in the submitted sample  additional confirmatory testing may be necessary for epidemiological  and / or clinical management purposes  to differentiate between  SARS-CoV-2 and other Sarbecovirus currently known to infect humans.  If clinically indicated additional testing with an alternate test  methodology [phone]) is advised. The SARS-CoV-2 RNA is generally  detectable in upper and lower respiratory sp ecimens during the acute  phase of infection. The expected result is Negative. Fact Sheet for Patients:  StrictlyIdeas.no Fact Sheet for Healthcare Providers: BankingDealers.co.za This test is not yet approved or cleared by the [place] FDA and has been authorized for detection and/or diagnosis of SARS-CoV-2 by FDA under an Emergency Use Authorization (EUA).  This EUA will remain in effect (meaning this test can be used) for the duration of the COVID-19 declaration under  Section 564(b)(1) of the Act, 21 U.S.C. section 360bbb-3(b)(1), unless the authorization is terminated or revoked sooner. Performed at [facility], [address], [place], [place] [address]   MRSA PCR Screening     Status: None   Collection Time: 05/17/19  9:45 AM   Specimen: Nasal Mucosa; Nasopharyngeal  Result Value Ref Range Status   MRSA by PCR NEGATIVE NEGATIVE Final    Comment:        The GeneXpert MRSA Assay (FDA approved for NASAL specimens only), is one component of a comprehensive MRSA colonization surveillance program. It is not intended to diagnose MRSA infection nor to guide or monitor treatment for MRSA infections. Performed at [facility], [address], [place], [place] [address]      Labs: BNP (last 3 results) No results for input(s): BNP in the last 8760 hours. Basic Metabolic Panel: Recent Labs  Lab 05/17/19 0446 05/18/19 0445  NA 141 139  K 4.9 4.1  CL 108 111  CO2 23 23  GLUCOSE 103* 100*  BUN 39* 17  CREATININE 2.62* 0.84  CALCIUM 9.6 8.2*  MG  --  1.6*   Liver Function Tests: Recent Labs  Lab 05/17/19 0446  AST 12*  ALT 16  ALKPHOS 81  BILITOT 0.2*  PROT 7.0  ALBUMIN 4.2   No results for input(s): LIPASE, AMYLASE in the last 168 hours. No results for input(s): AMMONIA in the last 168 hours. CBC: Recent Labs  Lab 05/17/19 0446  WBC 7.0  NEUTROABS 3.8  HGB 13.2  HCT 40.5  MCV 90.0  PLT 191   Cardiac Enzymes: Recent Labs  Lab 05/17/19 0446  CKTOTAL 87   BNP: Invalid input(s): POCBNP CBG: Recent Labs  Lab 05/17/19 0439  GLUCAP 117*   D-Dimer No results for input(s): DDIMER in the last 72 hours. Hgb A1c No results for input(s): HGBA1C in the last 72 hours. Lipid Profile No results for input(s): CHOL, HDL, LDLCALC, TRIG, CHOLHDL, LDLDIRECT in the last 72 hours. Thyroid function studies No results for input(s): TSH, T4TOTAL, T3FREE, THYROIDAB in the last 72 hours.  Invalid input(s): FREET3 Anemia work up No  results for input(s): VITAMINB12, FOLATE, FERRITIN, TIBC, IRON, RETICCTPCT in the last 72 hours. Urinalysis    Component Value Date/Time   COLORURINE YELLOW 05/17/2019 [place] 05/17/2019 0509   LABSPEC 1.012 05/17/2019 0509   PHURINE 5.0 05/17/2019 [place] 05/17/2019 0509   HGBUR NEGATIVE 05/17/2019 0509   BILIRUBINUR NEGATIVE 05/17/2019 0509   BILIRUBINUR neg 11/23/2017 [place] 05/17/2019 0509  PROTEINUR NEGATIVE 05/17/2019 0509   UROBILINOGEN 0.2 11/23/2017 1227   UROBILINOGEN 0.2 06/04/2015 0115   NITRITE NEGATIVE 05/17/2019 0509   LEUKOCYTESUR NEGATIVE 05/17/2019 0509   Sepsis Labs Invalid input(s): PROCALCITONIN,  WBC,  LACTICIDVEN Microbiology Recent Results (from the past 240 hour(s))  SARS Coronavirus 2 (CEPHEID - Performed in [facility] hospital lab), Hosp Order     Status: None   Collection Time: 05/17/19  6:04 AM   Specimen: Nasopharyngeal Swab  Result Value Ref Range Status   SARS Coronavirus 2 NEGATIVE NEGATIVE Final    Comment: (NOTE) If result is NEGATIVE SARS-CoV-2 target nucleic acids are NOT DETECTED. The SARS-CoV-2 RNA is generally detectable in upper and lower  respiratory specimens during the acute phase of infection. The lowest  concentration of SARS-CoV-2 viral copies this assay can detect is 250  copies / mL. A negative result does not preclude SARS-CoV-2 infection  and should not be used as the sole basis for treatment or other  patient management decisions.  A negative result may occur with  improper specimen collection / handling, submission of specimen other  than nasopharyngeal swab, presence of viral mutation(s) within the  areas targeted by this assay, and inadequate number of viral copies  (<250 copies / mL). A negative result must be combined with clinical  observations, patient history, and epidemiological information. If result is POSITIVE SARS-CoV-2 target nucleic acids are  DETECTED. The SARS-CoV-2 RNA is generally detectable in upper and lower  respiratory specimens dur ing the acute phase of infection.  Positive  results are indicative of active infection with SARS-CoV-2.  Clinical  correlation with patient history and other diagnostic information is  necessary to determine patient infection status.  Positive results do  not rule out bacterial infection or co-infection with other viruses. If result is PRESUMPTIVE POSTIVE SARS-CoV-2 nucleic acids MAY BE PRESENT.   A presumptive positive result was obtained on the submitted specimen  and confirmed on repeat testing.  While 2019 novel coronavirus  (SARS-CoV-2) nucleic acids may be present in the submitted sample  additional confirmatory testing may be necessary for epidemiological  and / or clinical management purposes  to differentiate between  SARS-CoV-2 and other Sarbecovirus currently known to infect humans.  If clinically indicated additional testing with an alternate test  methodology [phone]) is advised. The SARS-CoV-2 RNA is generally  detectable in upper and lower respiratory sp ecimens during the acute  phase of infection. The expected result is Negative. Fact Sheet for Patients:  BoilerBrush.com.cy Fact Sheet for Healthcare Providers: https://pope.com/ This test is not yet approved or cleared by the [place] FDA and has been authorized for detection and/or diagnosis of SARS-CoV-2 by FDA under an Emergency Use Authorization (EUA).  This EUA will remain in effect (meaning this test can be used) for the duration of the COVID-19 declaration under Section 564(b)(1) of the Act, 21 U.S.C. section 360bbb-3(b)(1), unless the authorization is terminated or revoked sooner. Performed at [facility], [address], [place], [place] [address]   MRSA PCR Screening     Status: None   Collection Time: 05/17/19  9:45 AM   Specimen: Nasal Mucosa;  Nasopharyngeal  Result Value Ref Range Status   MRSA by PCR NEGATIVE NEGATIVE Final    Comment:        The GeneXpert MRSA Assay (FDA approved for NASAL specimens only), is one component of a comprehensive MRSA colonization surveillance program. It is not intended to diagnose MRSA infection nor to guide or monitor  treatment for MRSA infections. Performed at [facility], [address] [place], [place] [address]      Time coordinating discharge: 35 minutes  SIGNED:   Erick Blinks, DO [facility] 05/18/2019, 7:58 AM  If 7PM-7AM, please contact night-coverage [email or web address] Password [email or web address]

## 2019-05-18 NOTE — Progress Notes (Signed)
AVS paperwork printed and reviewed with pt. Also included education on hydronephrosis, AKI, and drug OD. PIV removed from left arm. Pt tolerated well. Belongings sent home with pt. Transferred to private vehicle via w/c.

## 2019-05-21 MED FILL — PROMETHAZINE 25 MG TABLET: 25 | 15 days supply | Qty: 45 | Fill #0

## 2019-05-21 MED FILL — BUPRENORPHINE HCL-NALOXONE: 8-2 | 14 days supply | Qty: 42 | Fill #0

## 2019-05-24 ENCOUNTER — Other Ambulatory Visit: Payer: Self-pay

## 2019-05-24 ENCOUNTER — Ambulatory Visit (INDEPENDENT_AMBULATORY_CARE_PROVIDER_SITE_OTHER): Payer: Self-pay | Admitting: Urology

## 2019-05-24 DIAGNOSIS — N200 Calculus of kidney: Secondary | ICD-10-CM

## 2019-05-24 DIAGNOSIS — N1339 Other hydronephrosis: Secondary | ICD-10-CM

## 2019-06-04 MED FILL — PROMETHAZINE 25 MG TABLET: 25 | 15 days supply | Qty: 45 | Fill #0

## 2019-06-04 MED FILL — BUPRENORPHINE HCL-NALOXONE: 8-2 | 14 days supply | Qty: 42 | Fill #0

## 2019-06-06 ENCOUNTER — Other Ambulatory Visit (HOSPITAL_COMMUNITY): Payer: Self-pay | Admitting: Urology

## 2019-06-06 ENCOUNTER — Other Ambulatory Visit: Payer: Self-pay | Admitting: Urology

## 2019-06-06 DIAGNOSIS — N200 Calculus of kidney: Secondary | ICD-10-CM

## 2019-06-06 DIAGNOSIS — N1339 Other hydronephrosis: Secondary | ICD-10-CM

## 2019-06-18 MED FILL — BUPRENORPHINE HCL-NALOXONE: 8-2 | 28 days supply | Qty: 84 | Fill #0

## 2019-06-18 MED FILL — PROMETHAZINE 25 MG TABLET: 25 | 30 days supply | Qty: 90 | Fill #0

## 2019-06-21 ENCOUNTER — Ambulatory Visit (HOSPITAL_COMMUNITY)
Admission: RE | Admit: 2019-06-21 | Discharge: 2019-06-21 | Disposition: A | Discharge status: Home / Self Care | Payer: Self-pay | Source: Ambulatory Visit | Attending: Urology | Admitting: Urology

## 2019-06-21 ENCOUNTER — Other Ambulatory Visit: Payer: Self-pay

## 2019-06-21 DIAGNOSIS — N1339 Other hydronephrosis: Secondary | ICD-10-CM | POA: Insufficient documentation

## 2019-06-21 DIAGNOSIS — N200 Calculus of kidney: Secondary | ICD-10-CM

## 2019-07-16 MED FILL — PROMETHAZINE 25 MG TABLET: 25 | 30 days supply | Qty: 90 | Fill #0

## 2019-07-16 MED FILL — AMITIZA 24 MCG CAPSULES: 24 | 30 days supply | Qty: 60 | Fill #0

## 2019-07-16 MED FILL — BUPRENORPHINE HCL-NALOXONE: 8-2 | 28 days supply | Qty: 84 | Fill #0

## 2019-07-18 NOTE — Progress Notes (Deleted)
Cardiology Office Note:   Date:  07/18/2019  NAME:  [name]    MRN: [id number] DOB:  [birth year]   PCP:  [name], PA-C  Cardiologist:  Evalina Field, MD  Electrophysiologist:  None   Referring MD: [name], *   No chief complaint on file. ***  History of Present Illness:   [name] is a 58 y.o. female with a hx of COPD, HTN, GERD, HLD who is being seen today for the evaluation of chest pain at the request of Jimmye Norman, Breejante J, PA-C.  Past Medical History: Past Medical History:  Diagnosis Date  . Anxiety   . Asthma    Albuterol prn   . CHF (congestive heart failure) ([place])    takes Furosemide daily  . Chronic back pain    DDD/scoliosis  . Complication of anesthesia    hard to wake up  . COPD (chronic obstructive pulmonary disease) ([place])   . DDD (degenerative disc disease)   . Depression    takes Celexa,Clonazepam daily  . Diabetes mellitus    borderline  . Diverticulitis   . Edema of lower extremity   . Emphysema lung ([place])   . Fibromyalgia   . Gastritis   . GERD (gastroesophageal reflux disease)    takes Pepcid daily  . Gout   . Gout of big toe    RIGHT  . Hemorrhoids   . Hiatal hernia   . History of bronchitis    last time 4-12months ago   . History of colon polyps   . History of kidney stones    is in her left kidney and has been for couple of yrs and no problems  . HTN (hypertension)    takes lisinopril daily  . Hyperlipidemia    but doesn't take any meds  . Hypothyroidism    takes Synthroid daily  . Insomnia    doesn't take any meds for this  . Joint pain   . Joint swelling   . Migraine    last 04/04/18  . Migraine   . MS (multiple sclerosis) ([place])    early stages  . Muscle spasm    takes Flexeril daily as needed  . Neuropathy   . Osteoporosis   . Phlebitis   . Pneumonia    hx of;last time in 2014  . PONV (postoperative nausea and vomiting)   . Shortness of breath    with exertion or stressed    . Sleep apnea    sleep study done at least 65yrs ago;has CPAP but doesn't use  . Stroke [facility])    left sided weakness  . TIA (transient ischemic attack)    Hx: of  . Urinary frequency     Past Surgical History: Past Surgical History:  Procedure Laterality Date  . ABDOMINAL HYSTERECTOMY    . ANTERIOR CERVICAL DECOMP/DISCECTOMY FUSION N/A 12/30/2013   Procedure: ANTERIOR CERVICAL DISCECTOMY FUSION C4-5, C5-6, plate and screws, allograft, local bone graft;  Surgeon: Jessy Oto, MD;  Location: [place];  Service: Orthopedics;  Laterality: N/A;  . BACK SURGERY     lumbar discectomy  . BIOPSY N/A 01/20/2015   Procedure: COLON BIOPSY;  Surgeon: Danie Binder, MD;  Location: [facility];  Service: Endoscopy;  Laterality: N/A;  . BIOPSY  05/22/2018   Procedure: BIOPSY;  Surgeon: Danie Binder, MD;  [facility];  Service: Endoscopy;;  gastric  . BLADDER SUSPENSION    . CARDIAC CATHETERIZATION    .  CARPAL TUNNEL RELEASE Right   . CHOLECYSTECTOMY    . COLONOSCOPY    . COLONOSCOPY WITH PROPOFOL N/A 01/20/2015   SLF: 1. Normal ileum 2. Redundant left colon 3. Smalll internal hemorrhoids.   . ESOPHAGOGASTRODUODENOSCOPY  10/29/07   normal  . ESOPHAGOGASTRODUODENOSCOPY (EGD) WITH PROPOFOL N/A 05/22/2018   Procedure: ESOPHAGOGASTRODUODENOSCOPY (EGD) WITH PROPOFOL;  Surgeon: West BaliFields, Sandi L, MD;  [facility];  Service: Endoscopy;  Laterality: N/A;  9:30am  . fundic gland polyp     benign  . POLYPECTOMY N/A 01/20/2015   Procedure: RECTAL POLYPECTOMY;  Surgeon: West BaliSandi L Fields, MD;  Location: [facility];  Service: Endoscopy;  Laterality: N/A;  . SIGMOIDOSCOPY  01/31/08   large internal hemorrhoids/small rectal polyp removed/rare sigmoid diverticula    Current Medications: No outpatient medications have been marked as taking for the 07/19/19 encounter (Appointment) with Sande Rives'Neal, [place] Thomas, MD.     Allergies:    Demerol [meperidine], Imitrex [sumatriptan], Penicillins, Sulfa  antibiotics, Abilify [aripiprazole], Levaquin [levofloxacin in d5w], Neurontin [gabapentin], Toradol [ketorolac tromethamine], Tramadol, Ceclor [cefaclor], and Doxycycline   Social History: Social History   Socioeconomic History  . Marital status: Married    Spouse name: Not on file  . Number of children: Not on file  . Years of education: Not on file  . Highest education level: Not on file  Occupational History  . Not on file  Social Needs  . Financial resource strain: Not on file  . Food insecurity    Worry: Not on file    Inability: Not on file  . Transportation needs    Medical: Not on file    Non-medical: Not on file  Tobacco Use  . Smoking status: Current Some Day Smoker    Packs/day: 0.75    Years: 15.00    Pack years: 11.25    Types: Cigarettes  . Smokeless tobacco: Never Used  Substance and Sexual Activity  . Alcohol use: No  . Drug use: No  . Sexual activity: Yes    Birth control/protection: Surgical  Lifestyle  . Physical activity    Days per week: Not on file    Minutes per session: Not on file  . Stress: Not on file  Relationships  . Social connections    Talks on phone: More than three times a week    Gets together: More than three times a week    Attends religious service: More than 4 times per year    Active member of club or organization: No    Attends meetings of clubs or organizations: Never    Relationship status: Widowed  Other Topics Concern  . Not on file  Social History Narrative   Patient is married and has children.  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  Denies any alcohol or other drug use.  Reports that 1 of her children is also dealing with narcotic addiction and also goes to the Suboxone clinic with her.   Eats meat fruits and vegetables.   Currently smokes.   Not interested in quitting smoking at this time.   Wears seatbelt.     Family History: The patient's ***family history includes Arthritis  in her father; Cancer - Lung in an other family member; Cancer - Other in an other family member; Cancer - Prostate in an other family member; Colon cancer in her brother and paternal uncle; Diabetes in her mother and sister; Heart disease in her sister; Hypertension in her mother  and sister; Neuropathy in her sister; Transient ischemic attack in her sister.  ROS:   All other ROS reviewed and negative. Pertinent positives noted in the HPI.     EKGs/Labs/Other Studies Reviewed:   The following studies were personally reviewed by me today:  EKG:  EKG is *** ordered today.  The ekg ordered today demonstrates ***, and was personally reviewed by me.   Recent Labs: 05/17/2019: ALT 16; Hemoglobin 13.2; Platelets 191 05/18/2019: BUN 17; Creatinine, Ser 0.84; Magnesium 1.6; Potassium 4.1; Sodium 139   Recent Lipid Panel    Component Value Date/Time   CHOL 272 (H) 09/11/2017 1314   TRIG 116 09/11/2017 1314   HDL 40 (L) 09/11/2017 1314   CHOLHDL 6.8 (H) 09/11/2017 1314   VLDL 49 (H) 04/06/2015 0714   LDLCALC 207 (H) 09/11/2017 1314    Physical Exam:   VS:  There were no vitals taken for this visit.   Wt Readings from Last 3 Encounters:  05/17/19 138 lb 3.7 oz (62.7 kg)  02/10/19 132 lb (59.9 kg)  12/09/18 142 lb (64.4 kg)    General: Well nourished, well developed, in no acute distress Heart: Atraumatic, normal size  Eyes: PEERLA, EOMI  Neck: Supple, no JVD Endocrine: No thryomegaly Cardiac: Normal S1, S2; RRR; no murmurs, rubs, or gallops Lungs: Clear to auscultation bilaterally, no wheezing, rhonchi or rales  Abd: Soft, nontender, no hepatomegaly  Ext: No edema, pulses 2+ Musculoskeletal: No deformities, BUE and BLE strength normal and equal Skin: Warm and dry, no rashes   Neuro: Alert and oriented to person, place, time, and situation, CNII-XII grossly intact, no focal deficits  Psych: Normal mood and affect   ASSESSMENT:   NAME@ is a 58 y.o. female who presents for the  following: No diagnosis found.  PLAN:   There are no diagnoses linked to this encounter.  Disposition: No follow-ups on file.  Medication Adjustments/Labs and Tests Ordered: Current medicines are reviewed at length with the patient today.  Concerns regarding medicines are outlined above.  No orders of the defined types were placed in this encounter.  No orders of the defined types were placed in this encounter.   There are no Patient Instructions on file for this visit.   Signed, Lenna GilfordWesley T. Flora Lipps'Neal, MD [facility]  [facility]  [address], [address] [place], [place] [address] [phone]  07/18/2019 6:17 PM

## 2019-07-19 ENCOUNTER — Ambulatory Visit: Payer: Self-pay | Admitting: Cardiovascular Disease

## 2019-08-13 MED FILL — AMITIZA 24 MCG CAPSULES: 24 | 30 days supply | Qty: 60 | Fill #0

## 2019-08-13 MED FILL — PROMETHAZINE 25 MG TABLET: 25 | 30 days supply | Qty: 90 | Fill #0

## 2019-08-13 MED FILL — BUPRENORPHINE HCL-NALOXONE: 8-2 | 28 days supply | Qty: 84 | Fill #0

## 2019-08-14 ENCOUNTER — Ambulatory Visit (INDEPENDENT_AMBULATORY_CARE_PROVIDER_SITE_OTHER): Payer: 59 | Admitting: Cardiovascular Disease

## 2019-08-14 ENCOUNTER — Encounter: Payer: Self-pay | Admitting: Cardiovascular Disease

## 2019-08-14 ENCOUNTER — Other Ambulatory Visit: Payer: Self-pay

## 2019-08-14 VITALS — BP 116/64 | HR 102 | Temp 98.1°F | Ht 64.0 in | Wt 136.8 lb

## 2019-08-14 DIAGNOSIS — I10 Essential (primary) hypertension: Secondary | ICD-10-CM | POA: Diagnosis not present

## 2019-08-14 DIAGNOSIS — R072 Precordial pain: Secondary | ICD-10-CM | POA: Diagnosis not present

## 2019-08-14 DIAGNOSIS — Z720 Tobacco use: Secondary | ICD-10-CM | POA: Diagnosis not present

## 2019-08-14 DIAGNOSIS — I340 Nonrheumatic mitral (valve) insufficiency: Secondary | ICD-10-CM

## 2019-08-14 DIAGNOSIS — I739 Peripheral vascular disease, unspecified: Secondary | ICD-10-CM

## 2019-08-14 DIAGNOSIS — E782 Mixed hyperlipidemia: Secondary | ICD-10-CM

## 2019-08-14 MED ORDER — ROSUVASTATIN CALCIUM 40 MG PO TABS: 40.0000 mg | ORAL_TABLET | Freq: Every day | ORAL | 3 refills | Status: DC

## 2019-08-14 MED ORDER — NITROGLYCERIN 0.4 MG SL SUBL: 0.4000 mg | SUBLINGUAL_TABLET | SUBLINGUAL | 4 refills | Status: DC | PRN

## 2019-08-14 NOTE — Progress Notes (Signed)
Cardiology Office Note:   Date:  08/14/2019  NAME:  [name]    MRN: [id number] DOB:  1961-08-21   PCP:  [name], PA-C  Cardiologist:  Evalina Field, MD   Referring MD: [name], *   Chief Complaint  Patient presents with  . Chest Pain   History of Present Illness:   [name] is a 58 y.o. female with a hx of anxiety, COPD, DM who is being seen today for the evaluation of chest pain at the request of Jimmye Norman, Breejante J, PA-C.  She reports over the past 6 months she has had intermittent chest pain.  She reports she was getting the pain daily, but now she gets it about 3 times per week.  The pain is reported as a left-sided pressure in her chest that can go into her neck.  She also reports the pain going to her right arm.  She reports the pain can occur at rest or with exertion.  She reports the pain lasts 5 to 10 minutes and then goes away.  She reports no readily identifiable trigger for the pain, or readily identifiable alleviating factor.  She does report this is quite bothersome to her.  She is a strong family history of CAD in maternal grandparents.  Review of her cholesterol profile shows an LDL of 176 recently.  She is not diabetic.  She is smoking currently and has done so for about 10 to 15 years in her adult life.  She has had a stress test in the past, and reports having a normal left heart cath 20 years ago.  She also reports intermittent cramping in her legs with exercise.  She reports a classic symptoms of claudication with achy in the leg calfs that gets better with cessation of activity.  She also endorses a history of COPD, and gets short of breath with exertion.  It is difficult to determine if her shortness of breath is related to this chest pain.  I did review her most recent echocardiogram, and there was demonstratable moderate mitral vegetation, normal left ventricular ejection fraction.  No repeat has been done since 2016.  Past  Medical History: Past Medical History:  Diagnosis Date  . Anxiety   . Asthma    Albuterol prn   . CHF (congestive heart failure) ([place])    takes Furosemide daily  . Chronic back pain    DDD/scoliosis  . Complication of anesthesia    hard to wake up  . COPD (chronic obstructive pulmonary disease) ([place])   . DDD (degenerative disc disease)   . Depression    takes Celexa,Clonazepam daily  . Diabetes mellitus    borderline  . Diverticulitis   . Edema of lower extremity   . Emphysema lung ([place])   . Fibromyalgia   . Gastritis   . GERD (gastroesophageal reflux disease)    takes Pepcid daily  . Gout   . Gout of big toe    RIGHT  . Hemorrhoids   . Hiatal hernia   . History of bronchitis    last time 4-54months ago   . History of colon polyps   . History of kidney stones    is in her left kidney and has been for couple of yrs and no problems  . HTN (hypertension)    takes lisinopril daily  . Hyperlipidemia    but doesn't take any meds  . Hypothyroidism    takes Synthroid daily  .  Insomnia    doesn't take any meds for this  . Joint pain   . Joint swelling   . Migraine    last 04/04/18  . Migraine   . MS (multiple sclerosis) (HCC)    early stages  . Muscle spasm    takes Flexeril daily as needed  . Neuropathy   . Osteoporosis   . Phlebitis   . Pneumonia    hx of;last time in 2014  . PONV (postoperative nausea and vomiting)   . Shortness of breath    with exertion or stressed   . Sleep apnea    sleep study done at least 1420yrs ago;has CPAP but doesn't use  . Stroke [facility](HCC)    left sided weakness  . TIA (transient ischemic attack)    Hx: of  . Urinary frequency     Past Surgical History: Past Surgical History:  Procedure Laterality Date  . ABDOMINAL HYSTERECTOMY    . ANTERIOR CERVICAL DECOMP/DISCECTOMY FUSION N/A 12/30/2013   Procedure: ANTERIOR CERVICAL DISCECTOMY FUSION C4-5, C5-6, plate and screws, allograft, local bone graft;  Surgeon: Kerrin ChampagneJames E Nitka, MD;   Location: [facility] OR;  Service: Orthopedics;  Laterality: N/A;  . BACK SURGERY     lumbar discectomy  . BIOPSY N/A 01/20/2015   Procedure: COLON BIOPSY;  Surgeon: West BaliSandi L Fields, MD;  Location: [facility] ORS;  Service: Endoscopy;  Laterality: N/A;  . BIOPSY  05/22/2018   Procedure: BIOPSY;  Surgeon: West BaliFields, Sandi L, MD;  Location: [facility] ENDO SUITE;  Service: Endoscopy;;  gastric  . BLADDER SUSPENSION    . CARDIAC CATHETERIZATION    . CARPAL TUNNEL RELEASE Right   . CHOLECYSTECTOMY    . COLONOSCOPY    . COLONOSCOPY WITH PROPOFOL N/A 01/20/2015   SLF: 1. Normal ileum 2. Redundant left colon 3. Smalll internal hemorrhoids.   . ESOPHAGOGASTRODUODENOSCOPY  10/29/07   normal  . ESOPHAGOGASTRODUODENOSCOPY (EGD) WITH PROPOFOL N/A 05/22/2018   Procedure: ESOPHAGOGASTRODUODENOSCOPY (EGD) WITH PROPOFOL;  Surgeon: West BaliFields, Sandi L, MD;  Location: [facility] SU[facility];  Service: Endoscopy;  Laterality: N/A;  9:30am  . fundic gland polyp     benign  . POLYPECTOMY N/A 01/20/2015   Procedure: RECTAL POLYPECTOMY;  Surgeon: West BaliSandi L Fields, MD;  Location: [facility] ORS;  Service: Endoscopy;  Laterality: N/A;  . SIGMOIDOSCOPY  01/31/08   large internal hemorrhoids/small rectal polyp removed/rare sigmoid diverticula    Current Medications: Current Meds  Medication Sig  . albuterol (VENTOLIN HFA) 108 (90 Base) MCG/ACT inhaler Inhale into the lungs.  . AMITIZA 24 MCG capsule   . bismuth subsalicylate (PEPTO BISMOL) 262 MG chewable tablet Chew 524 mg by mouth as needed for indigestion or diarrhea or loose stools.  . Buprenorphine HCl-Naloxone HCl 8-2 MG FILM   . citalopram (CELEXA) 40 MG tablet Take 40 mg by mouth daily.  . cyclobenzaprine (FLEXERIL) 10 MG tablet Take 1 tablet (10 mg total) by mouth 3 (three) times daily as needed for muscle spasms.  Marland Kitchen. levothyroxine (SYNTHROID) 175 MCG tablet Take 175 mcg by mouth daily before breakfast.  . lisinopril (ZESTRIL) 40 MG tablet Take 80 mg by mouth daily.  . promethazine (PHENERGAN) 25 MG  tablet   . traZODone (DESYREL) 150 MG tablet Take 150 mg by mouth at bedtime.      Allergies:    Demerol [meperidine], Imitrex [sumatriptan], Penicillins, Sulfa antibiotics, Abilify [aripiprazole], Levaquin [levofloxacin in d5w], Neurontin [gabapentin], Toradol [ketorolac tromethamine], Tramadol, Ceclor [cefaclor], and Doxycycline   Social History: Social History  Socioeconomic History  . Marital status: Married    Spouse name: Not on file  . Number of children: Not on file  . Years of education: Not on file  . Highest education level: Not on file  Occupational History  . Not on file  Social Needs  . Financial resource strain: Not on file  . Food insecurity    Worry: Not on file    Inability: Not on file  . Transportation needs    Medical: Not on file    Non-medical: Not on file  Tobacco Use  . Smoking status: Current Some Day Smoker    Packs/day: 0.75    Years: 15.00    Pack years: 11.25    Types: Cigarettes  . Smokeless tobacco: Never Used  Substance and Sexual Activity  . Alcohol use: No  . Drug use: No  . Sexual activity: Yes    Birth control/protection: Surgical  Lifestyle  . Physical activity    Days per week: Not on file    Minutes per session: Not on file  . Stress: Not on file  Relationships  . Social connections    Talks on phone: More than three times a week    Gets together: More than three times a week    Attends religious service: More than 4 times per year    Active member of club or organization: No    Attends meetings of clubs or organizations: Never    Relationship status: Widowed  Other Topics Concern  . Not on file  Social History Narrative   Patient is married and has children.  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  Denies any alcohol or other drug use.  Reports that 1 of her children is also dealing with narcotic addiction and also goes to the Suboxone clinic with her.   Eats meat fruits and vegetables.    Currently smokes.   Not interested in quitting smoking at this time.   Wears seatbelt.     Family History: The patient's family history includes Arthritis in her father; Cancer - Lung in an other family member; Cancer - Other in an other family member; Cancer - Prostate in an other family member; Colon cancer in her brother and paternal uncle; Diabetes in her mother and sister; Heart disease in her sister; Heart failure in her father; Hypertension in her mother and sister; Neuropathy in her sister; Transient ischemic attack in her sister.  ROS:   All other ROS reviewed and negative. Pertinent positives noted in the HPI.     EKGs/Labs/Other Studies Reviewed:   The following studies were personally reviewed by me today:  EKG:  EKG is ordered today. The EKG demonstrates normal sinus rhythm, heart rate 86, no acute ST-T changes, normal intervals, no prior infarction, normal EKG ordered today, and was personally reviewed by me.   TTE: 2016 - Left ventricle: The cavity size was normal. Systolic function was   normal. The estimated ejection fraction was in the range of 50%   to 55%. Wall motion was normal; there were no regional wall   motion abnormalities. Doppler parameters are consistent with   abnormal left ventricular relaxation (grade 1 diastolic   dysfunction). There was no evidence of elevated ventricular   filling pressure by Doppler parameters. - Aortic valve: Trileaflet; normal thickness leaflets. There was no   regurgitation. - Aortic root: The aortic root was normal in size. - Mitral valve: Mitral valve leaflets are moderately  thickened.   Anterior leaflet is bowing in systole (not prolapsing) associated   with posteriorly directed jet and moderate mitral regurgitation.   There was mild to moderate regurgitation. - Right ventricle: The cavity size was normal. Wall thickness was   normal. Systolic function was normal. - Right atrium: The atrium was normal in size. - Tricuspid  valve: There was mild regurgitation. - Pulmonic valve: There was trivial regurgitation. - Pulmonary arteries: Systolic pressure was within the normal   range. - Inferior vena cava: The vessel was normal in size. - Pericardium, extracardiac: There was no pericardial effusion.  Recent Labs: 05/17/2019: ALT 16; Hemoglobin 13.2; Platelets 191 05/18/2019: BUN 17; Creatinine, Ser 0.84; Magnesium 1.6; Potassium 4.1; Sodium 139   Recent Lipid Panel    Component Value Date/Time   CHOL 272 (H) 09/11/2017 1314   TRIG 116 09/11/2017 1314   HDL 40 (L) 09/11/2017 1314   CHOLHDL 6.8 (H) 09/11/2017 1314   VLDL 49 (H) 04/06/2015 0714   LDLCALC 207 (H) 09/11/2017 1314    Physical Exam:   VS:  BP 116/64   Pulse (!) 102   Temp 98.1 F (36.7 C)   Ht  (1.626 m)   Wt 136 lb 12.8 oz (62.1 kg)   SpO2 96%   BMI 23.48 kg/m    Wt Readings from Last 3 Encounters:  08/14/19 136 lb 12.8 oz (62.1 kg)  05/17/19 138 lb 3.7 oz (62.7 kg)  02/10/19 132 lb (59.9 kg)    General: Well nourished, well developed, in no acute distress Heart: Atraumatic, normal size  Eyes: PEERLA, EOMI  Neck: Supple, no JVD Endocrine: No thryomegaly Cardiac: 2 out of 6 holosystolic murmur best heard at the apex Lungs: Clear to auscultation bilaterally, no wheezing, rhonchi or rales  Abd: Soft, nontender, no hepatomegaly  Ext: Diminished pulses bilaterally in the lower extremities Musculoskeletal: No deformities, BUE and BLE strength normal and equal Skin: Warm and dry, no rashes   Neuro: Alert and oriented to person, place, time, and situation, CNII-XII grossly intact, no focal deficits  Psych: Normal mood and affect   ASSESSMENT:   NAME@ is a 58 y.o. female who presents for the following: 1. Precordial pain   2. Essential hypertension   3. Mitral valve insufficiency, unspecified etiology   4. Tobacco abuse   5. Claudication in peripheral vascular disease (HCC)   6. Mixed hyperlipidemia     PLAN:   1. Precordial  pain -Her chest pain is concerning for likely underlying CAD.  She does report atypical features of the pain.  She has significant risk factors for CAD.  We will go ahead and proceed with a Lexiscan nuclear medicine myocardial perfusion imaging study as soon as we can.  We will start with sublingual nitro as needed for the pain.  She seems to be doing a little better.  She was given strict return precautions as well as if she takes more than 3 sublingual nitroglycerin she should report the emergency room. -She has significant CVD risk factors including uncontrolled hyper cholesterolemia, hypertension, and strong family history  2. Essential hypertension -Blood pressure at goal today we will continue current medications of lisinopril  3. Mitral valve insufficiency, unspecified etiology -She was noted to have moderate mitral valve regurgitation on echo in 2016.  No repeat study has been ordered.  She has a faint murmur on examination.  She had no obvious prolapse but did have a billowing anterior mitral valve leaflet.  4. Tobacco abuse -Counseled  on the importance of smoking cessation  5. Claudication in peripheral vascular disease (HCC) -She does report symptoms of claudication that are rather classic -We will proceed with ABIs and lower extremity arterial duplexes  6. Mixed hyperlipidemia -Her most recent LDL cholesterol at her primary care physician was 176.  In our system she has had LDL up to 200. -10-year ASCVD risk factor score puts her at a 12% risk for 10-year cardiovascular event; we will start Crestor 40 mg a plan to recheck her cholesterol in a few months   Disposition: Return in about 3 months (around 11/14/2019).  Medication Adjustments/Labs and Tests Ordered: Current medicines are reviewed at length with the patient today.  Concerns regarding medicines are outlined above.  Orders Placed This Encounter  Procedures  . MYOCARDIAL PERFUSION IMAGING  . EKG 12-Lead  .  ECHOCARDIOGRAM COMPLETE  . VAS [place] LOWER EXTREMITY ARTERIAL DUPLEX  . VAS [place] ABI WITH/WO TBI   Meds ordered this encounter  Medications  . rosuvastatin (CRESTOR) 40 MG tablet    Sig: Take 1 tablet (40 mg total) by mouth daily.    Dispense:  30 tablet    Refill:  3  . nitroGLYCERIN (NITROSTAT) 0.4 MG SL tablet    Sig: Place 1 tablet (0.4 mg total) under the tongue every 5 (five) minutes as needed for chest pain.    Dispense:  25 tablet    Refill:  4    Patient Instructions  Medication Instructions:  START Rosuvastatin (Crestor) 40 mg daily.  Take Nitroglycerin 0.4 mg every 5 minutes as needed up to a maximum of 3 doses for chest pain.  If you need a refill on your cardiac medications before your next appointment, please call your pharmacy.    Testing/Procedures: Your physician has requested that you have a lexiscan myoview. For further information please visit [email or web address] Please follow instruction sheet, as given.  Your physician has requested that you have a lower extremity arterial duplex. During this test, ultrasound is used to evaluate arterial blood flow in the legs. Allow one hour for this exam. There are no restrictions or special instructions.  Your physician has requested that you have an ankle brachial index (ABI). During this test an ultrasound and blood pressure cuff are used to evaluate the arteries that supply the arms and legs with blood. Allow thirty minutes for this exam. There are no restrictions or special instructions.  Your physician has requested that you have an echocardiogram. Echocardiography is a painless test that uses sound waves to create images of your heart. It provides your doctor with information about the size and shape of your heart and how well your heart's chambers and valves are working. This procedure takes approximately one hour. There are no restrictions for this procedure.  Follow-Up: At [facility], you and your health needs are  our priority.  As part of our continuing mission to provide you with exceptional heart care, we have created designated Provider Care Teams.  These Care Teams include your primary Cardiologist (physician) and Advanced Practice Providers (APPs -  Physician Assistants and Nurse Practitioners) who all work together to provide you with the care you need, when you need it. . Please schedule a follow-up appointment to see Dr. Flora Lipps in 3 months.       Signed, Lenna Gilford. Flora Lipps, MD [facility]  [address], [address] [place], [place] [address] [phone]  08/14/2019 3:49 PM

## 2019-08-14 NOTE — Patient Instructions (Signed)
Medication Instructions:  START Rosuvastatin (Crestor) 40 mg daily.  Take Nitroglycerin 0.4 mg every 5 minutes as needed up to a maximum of 3 doses for chest pain.  If you need a refill on your cardiac medications before your next appointment, please call your pharmacy.    Testing/Procedures: Your physician has requested that you have a lexiscan myoview. For further information please visit [email or web address]. Please follow instruction sheet, as given.  Your physician has requested that you have a lower extremity arterial duplex. During this test, ultrasound is used to evaluate arterial blood flow in the legs. Allow one hour for this exam. There are no restrictions or special instructions.  Your physician has requested that you have an ankle brachial index (ABI). During this test an ultrasound and blood pressure cuff are used to evaluate the arteries that supply the arms and legs with blood. Allow thirty minutes for this exam. There are no restrictions or special instructions.  Your physician has requested that you have an echocardiogram. Echocardiography is a painless test that uses sound waves to create images of your heart. It provides your doctor with information about the size and shape of your heart and how well your heart's chambers and valves are working. This procedure takes approximately one hour. There are no restrictions for this procedure.  Follow-Up: At [facility], you and your health needs are our priority.  As part of our continuing mission to provide you with exceptional heart care, we have created designated Provider Care Teams.  These Care Teams include your primary Cardiologist (physician) and Advanced Practice Providers (APPs -  Physician Assistants and Nurse Practitioners) who all work together to provide you with the care you need, when you need it. . Please schedule a follow-up appointment to see Dr. Audie Box in 3 months.

## 2019-08-21 ENCOUNTER — Other Ambulatory Visit: Payer: Self-pay

## 2019-08-21 ENCOUNTER — Ambulatory Visit (HOSPITAL_COMMUNITY): Payer: 59 | Attending: Internal Medicine

## 2019-08-21 DIAGNOSIS — I340 Nonrheumatic mitral (valve) insufficiency: Secondary | ICD-10-CM

## 2019-08-27 ENCOUNTER — Telehealth (HOSPITAL_COMMUNITY): Payer: Self-pay

## 2019-08-27 NOTE — Telephone Encounter (Signed)
Encounter complete. 

## 2019-08-29 ENCOUNTER — Other Ambulatory Visit: Payer: Self-pay

## 2019-08-29 ENCOUNTER — Ambulatory Visit (HOSPITAL_COMMUNITY)
Admission: RE | Admit: 2019-08-29 | Discharge: 2019-08-29 | Disposition: A | Discharge status: Home / Self Care | Payer: 59 | Source: Ambulatory Visit | Attending: Cardiology | Admitting: Cardiology

## 2019-08-29 ENCOUNTER — Ambulatory Visit (HOSPITAL_BASED_OUTPATIENT_CLINIC_OR_DEPARTMENT_OTHER)
Admission: RE | Admit: 2019-08-29 | Discharge: 2019-08-29 | Disposition: A | Discharge status: Home / Self Care | Payer: 59 | Source: Ambulatory Visit | Attending: Cardiovascular Disease | Admitting: Cardiovascular Disease

## 2019-08-29 DIAGNOSIS — R072 Precordial pain: Secondary | ICD-10-CM | POA: Insufficient documentation

## 2019-08-29 DIAGNOSIS — I739 Peripheral vascular disease, unspecified: Secondary | ICD-10-CM | POA: Insufficient documentation

## 2019-08-29 LAB — MYOCARDIAL PERFUSION IMAGING
LV dias vol: 87 mL (ref 46–106)
LV sys vol: 32 mL
Peak HR: 93 {beats}/min
Rest HR: 79 {beats}/min
SDS: 1
SRS: 0
SSS: 1
TID: 1.06

## 2019-08-29 MED ORDER — REGADENOSON 0.4 MG/5ML IV SOLN
0.4000 mg | Freq: Once | INTRAVENOUS | Status: AC
  Administered 2019-08-29: 0.4 mg via INTRAVENOUS

## 2019-08-29 MED ORDER — AMINOPHYLLINE 25 MG/ML IV SOLN
75.0000 mg | Freq: Once | INTRAVENOUS | Status: AC
  Administered 2019-08-29: 75 mg via INTRAVENOUS

## 2019-08-29 MED ORDER — TECHNETIUM TC 99M TETROFOSMIN IV KIT
28.7000 | PACK | Freq: Once | INTRAVENOUS | Status: AC | PRN
  Administered 2019-08-29: 28.7 via INTRAVENOUS
  Filled 2019-08-29: qty 29

## 2019-08-29 MED ORDER — TECHNETIUM TC 99M TETROFOSMIN IV KIT
10.3000 | PACK | Freq: Once | INTRAVENOUS | Status: AC | PRN
  Administered 2019-08-29: 10.3 via INTRAVENOUS
  Filled 2019-08-29: qty 11

## 2019-08-30 ENCOUNTER — Telehealth: Payer: Self-pay | Admitting: Cardiovascular Disease

## 2019-08-30 NOTE — Telephone Encounter (Signed)
Called [name] to let her know her stress test and ABIs were normal. She is having episodes of rapid heart rate. I will get her in to see me next week.   [place] T. Audie Box, [place]  [address], [place], [place] [address] [phone]  3:21 PM

## 2019-09-02 NOTE — Progress Notes (Signed)
Cardiology Office Note:   Date:  09/03/2019  NAME:  [name]    MRN: [id number] DOB:  09-11-1961   PCP:  Roger Kill, PA-C  Cardiologist:  Reatha Harps, MD   Referring MD: Roger Kill, *   Chief Complaint  Patient presents with  . Chest Pain   History of Present Illness:   [name] is a 58 y.o. female with a hx of anxiety, COPD, diabetes who is being seen today for follow-up of recent testing for chest pain and new complaint of palpitations.  She was evaluated on 10/7 for intermittent chest pain.  Her work-up is included a normal echocardiogram as well as normal myocardial perfusion imaging.  She also complained of pain in her legs with exertion, concerning for claudication.  Recent ABIs show normal blood flow to the lower extremities.  She continues to have episodes of central chest pressure.  She reports that she has had 2 episodes of central chest pressure that lasted 5 to 8 minutes.  She reports the pain goes into her jaws and into her right arm.  She is not take any nitroglycerin with his pain she reports the pain does subside without any intervention.  Associated symptoms include shortness of breath.  She also reports daily, several times per day of missed heartbeats.  She reports that the chest pain and palpitations appear to be unrelated.  She is quite alarmed given her pain continues to happen despite a normal stress test.  Her echocardiogram was normal as well.  She denies any fevers chills, or other symptoms.  There are no symptoms of heart failure.  Past Medical History: Past Medical History:  Diagnosis Date  . Anxiety   . Asthma    Albuterol prn   . CHF (congestive heart failure) (HCC)    takes Furosemide daily  . Chronic back pain    DDD/scoliosis  . Complication of anesthesia    hard to wake up  . COPD (chronic obstructive pulmonary disease) (HCC)   . DDD (degenerative disc disease)   . Depression    takes Celexa,Clonazepam daily  .  Diabetes mellitus    borderline  . Diverticulitis   . Edema of lower extremity   . Emphysema lung (HCC)   . Fibromyalgia   . Gastritis   . GERD (gastroesophageal reflux disease)    takes Pepcid daily  . Gout   . Gout of big toe    RIGHT  . Hemorrhoids   . Hiatal hernia   . History of bronchitis    last time 4-56months ago   . History of colon polyps   . History of kidney stones    is in her left kidney and has been for couple of yrs and no problems  . HTN (hypertension)    takes lisinopril daily  . Hyperlipidemia    but doesn't take any meds  . Hypothyroidism    takes Synthroid daily  . Insomnia    doesn't take any meds for this  . Joint pain   . Joint swelling   . Migraine    last 04/04/18  . Migraine   . MS (multiple sclerosis) (HCC)    early stages  . Muscle spasm    takes Flexeril daily as needed  . Neuropathy   . Osteoporosis   . Phlebitis   . Pneumonia    hx of;last time in 2014  . PONV (postoperative nausea and vomiting)   . Shortness of breath  with exertion or stressed   . Sleep apnea    sleep study done at least 87yrs ago;has CPAP but doesn't use  . Stroke [facility])    left sided weakness  . TIA (transient ischemic attack)    Hx: of  . Urinary frequency     Past Surgical History: Past Surgical History:  Procedure Laterality Date  . ABDOMINAL HYSTERECTOMY    . ANTERIOR CERVICAL DECOMP/DISCECTOMY FUSION N/A 12/30/2013   Procedure: ANTERIOR CERVICAL DISCECTOMY FUSION C4-5, C5-6, plate and screws, allograft, local bone graft;  Surgeon: Jessy Oto, MD;  Location: [facility];  Service: Orthopedics;  Laterality: N/A;  . BACK SURGERY     lumbar discectomy  . BIOPSY N/A 01/20/2015   Procedure: COLON BIOPSY;  Surgeon: Danie Binder, MD;  Location: [facility];  Service: Endoscopy;  Laterality: N/A;  . BIOPSY  05/22/2018   Procedure: BIOPSY;  Surgeon: Danie Binder, MD;  [facility];  Service: Endoscopy;;  gastric  . BLADDER SUSPENSION    . CARDIAC  CATHETERIZATION    . CARPAL TUNNEL RELEASE Right   . CHOLECYSTECTOMY    . COLONOSCOPY    . COLONOSCOPY WITH PROPOFOL N/A 01/20/2015   SLF: 1. Normal ileum 2. Redundant left colon 3. Smalll internal hemorrhoids.   . ESOPHAGOGASTRODUODENOSCOPY  10/29/07   normal  . ESOPHAGOGASTRODUODENOSCOPY (EGD) WITH PROPOFOL N/A 05/22/2018   Procedure: ESOPHAGOGASTRODUODENOSCOPY (EGD) WITH PROPOFOL;  Surgeon: Danie Binder, MD;  [facility];  Service: Endoscopy;  Laterality: N/A;  9:30am  . fundic gland polyp     benign  . POLYPECTOMY N/A 01/20/2015   Procedure: RECTAL POLYPECTOMY;  Surgeon: Danie Binder, MD;  Location: [facility];  Service: Endoscopy;  Laterality: N/A;  . SIGMOIDOSCOPY  01/31/08   large internal hemorrhoids/small rectal polyp removed/rare sigmoid diverticula    Current Medications: Current Meds  Medication Sig  . albuterol (VENTOLIN HFA) 108 (90 Base) MCG/ACT inhaler Inhale into the lungs.  . AMITIZA 24 MCG capsule   . bismuth subsalicylate (PEPTO BISMOL) 262 MG chewable tablet Chew 524 mg by mouth as needed for indigestion or diarrhea or loose stools.  . Buprenorphine HCl-Naloxone HCl 8-2 MG FILM   . citalopram (CELEXA) 40 MG tablet Take 40 mg by mouth daily.  . cyclobenzaprine (FLEXERIL) 10 MG tablet Take 1 tablet (10 mg total) by mouth 3 (three) times daily as needed for muscle spasms.  Marland Kitchen levothyroxine (SYNTHROID) 175 MCG tablet Take 175 mcg by mouth daily before breakfast.  . lisinopril (ZESTRIL) 40 MG tablet Take 1 tablet (40 mg total) by mouth daily.  . nitroGLYCERIN (NITROSTAT) 0.4 MG SL tablet Place 1 tablet (0.4 mg total) under the tongue every 5 (five) minutes as needed for chest pain.  . promethazine (PHENERGAN) 25 MG tablet   . rosuvastatin (CRESTOR) 40 MG tablet Take 1 tablet (40 mg total) by mouth daily.  . traZODone (DESYREL) 150 MG tablet Take 150 mg by mouth at bedtime.   . [DISCONTINUED] lisinopril (ZESTRIL) 40 MG tablet Take 80 mg by mouth daily.      Allergies:    Demerol [meperidine], Imitrex [sumatriptan], Penicillins, Sulfa antibiotics, Abilify [aripiprazole], Levaquin [levofloxacin in d5w], Neurontin [gabapentin], Toradol [ketorolac tromethamine], Tramadol, Ceclor [cefaclor], and Doxycycline   Social History: Social History   Socioeconomic History  . Marital status: Married    Spouse name: Not on file  . Number of children: Not on file  . Years of education: Not on file  . Highest education  level: Not on file  Occupational History  . Not on file  Social Needs  . Financial resource strain: Not on file  . Food insecurity    Worry: Not on file    Inability: Not on file  . Transportation needs    Medical: Not on file    Non-medical: Not on file  Tobacco Use  . Smoking status: Current Some Day Smoker    Packs/day: 0.75    Years: 15.00    Pack years: 11.25    Types: Cigarettes  . Smokeless tobacco: Never Used  Substance and Sexual Activity  . Alcohol use: No  . Drug use: No  . Sexual activity: Yes    Birth control/protection: Surgical  Lifestyle  . Physical activity    Days per week: Not on file    Minutes per session: Not on file  . Stress: Not on file  Relationships  . Social connections    Talks on phone: More than three times a week    Gets together: More than three times a week    Attends religious service: More than 4 times per year    Active member of club or organization: No    Attends meetings of clubs or organizations: Never    Relationship status: Widowed  Other Topics Concern  . Not on file  Social History Narrative   Patient is married and has children.  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  Denies any alcohol or other drug use.  Reports that 1 of her children is also dealing with narcotic addiction and also goes to the Suboxone clinic with her.   Eats meat fruits and vegetables.   Currently smokes.   Not interested in quitting smoking at this time.   Wears  seatbelt.     Family History: The patient's family history includes Arthritis in her father; Cancer - Lung in an other family member; Cancer - Other in an other family member; Cancer - Prostate in an other family member; Colon cancer in her brother and paternal uncle; Diabetes in her mother and sister; Heart disease in her sister; Heart failure in her father; Hypertension in her mother and sister; Neuropathy in her sister; Transient ischemic attack in her sister.  ROS:   All other ROS reviewed and negative. Pertinent positives noted in the HPI.     EKGs/Labs/Other Studies Reviewed:   The following studies were personally reviewed by me today:  TTE 08/21/2019  1. Left ventricular ejection fraction, by visual estimation, is 60 to 65%. The left ventricle has normal function. There is no left ventricular hypertrophy.  2. Left ventricular diastolic Doppler parameters are consistent with impaired relaxation pattern of LV diastolic filling.  3. Global right ventricle has normal systolic function.The right ventricular size is normal. No increase in right ventricular wall thickness.  4. Left atrial size was moderately dilated.  5. Right atrial size was normal.  6. The mitral valve is abnormal. Mild mitral valve regurgitation.  7. The tricuspid valve is grossly normal. Tricuspid valve regurgitation is trivial.  8. The aortic valve is tricuspid Aortic valve regurgitation was not visualized by color flow Doppler.  9. The pulmonic valve was grossly normal. Pulmonic valve regurgitation is trivial by color flow Doppler. 10. Normal pulmonary artery systolic pressure. 11. The inferior vena cava is normal in size with greater than 50% respiratory variability, suggesting right atrial pressure of 3 mmHg.  LE Arterial duplex 08/29/2019 Summary: Right: Resting right ankle-brachial  index is within normal range. No evidence of significant right lower extremity arterial disease. The right toe-brachial index is  normal.  Left: Resting left ankle-brachial index is within normal range. No evidence of significant left lower extremity arterial disease. The left toe-brachial index is normal.  NM MPI (08/29/2019)  The left ventricular ejection fraction is normal (55-65%).  Nuclear stress EF: 63%.  There was no ST segment deviation noted during stress.  The study is normal.  This is a low risk study.  No prior study for comparison.  Recent Labs: 05/17/2019: ALT 16; Hemoglobin 13.2; Platelets 191 05/18/2019: BUN 17; Creatinine, Ser 0.84; Magnesium 1.6; Potassium 4.1; Sodium 139   Recent Lipid Panel    Component Value Date/Time   CHOL 272 (H) 09/11/2017 1314   TRIG 116 09/11/2017 1314   HDL 40 (L) 09/11/2017 1314   CHOLHDL 6.8 (H) 09/11/2017 1314   VLDL 49 (H) 04/06/2015 0714   LDLCALC 207 (H) 09/11/2017 1314    Physical Exam:   VS:  BP (!) 156/85   Pulse 97   Ht 5\' 4"  (1.626 m)   Wt 135 lb (61.2 kg)   SpO2 100%   BMI 23.17 kg/m    Wt Readings from Last 3 Encounters:  09/03/19 135 lb (61.2 kg)  08/29/19 136 lb (61.7 kg)  08/14/19 136 lb 12.8 oz (62.1 kg)    General: Well nourished, well developed, in no acute distress Heart: Atraumatic, normal size  Eyes: PEERLA, EOMI  Neck: Supple, no JVD Endocrine: No thryomegaly Cardiac: Normal S1, S2; RRR; no murmurs, rubs, or gallops Lungs: Clear to auscultation bilaterally, no wheezing, rhonchi or rales  Abd: Soft, nontender, no hepatomegaly  Ext: No edema, pulses 2+ Musculoskeletal: No deformities, BUE and BLE strength normal and equal Skin: Warm and dry, no rashes   Neuro: Alert and oriented to person, place, time, and situation, CNII-XII grossly intact, no focal deficits  Psych: Normal mood and affect   ASSESSMENT:   APIPHANY TROCHEZ is a 58 y.o. female who presents for the following: 1. Chest pain of uncertain etiology   2. Palpitations   3. Essential hypertension   4. Tobacco abuse   5. Precordial pain     PLAN:   1. Chest  pain of uncertain etiology -She continues to complain of episodic chest pain.  The pain is atypical.  Occurs at rest described as sharp pressure in the central chest.  She is not take any nitroglycerin, but this has responded to nitro in the past.  She had a recent nuclear stress imaging study that was normal and normal echo.  I did review her recent CT abdomen pelvis and this shows no evidence of coronary calcium in 2019.  I think to clarify what is actually going on we will proceed with a coronary CTA to exclude obstructive CAD as etiology.  I suspect this is likely an end up being guarded but we will need to exclude CAD. -She will also take 120 mg of diltiazem immediate release tablet 1 hour before her scan -She will get a BMP done 1 week before this is well -She can continue to take nitroglycerin and was given strict return precautions to the emergency room if she exceeds 3 doses after 15 minutes of persistent chest pain (she will take it every 3 to 5 minutes) -We will also switch her to as esomeprazole 40 mg daily and stop her Prilosec  2. Palpitations -She has daily episodes of palpitations we will proceed with  a 7-day ZIO patch  3. Essential hypertension -She just took her blood pressure medications we will keep an eye on this -Okay we will add amlodipine 10 mg once daily and reduce her lisinopril to 40 mg once daily  4. Tobacco abuse -She is down to 10 cigarettes daily, and needs to continue to quit -Smoking cessation counseling provided today  5. Mitral regurgitation, mild -Mild regurgitation seen on echocardiogram, repeat in 3 to 5 years  Disposition: Return in about 2 months (around 11/03/2019).  Medication Adjustments/Labs and Tests Ordered: Current medicines are reviewed at length with the patient today.  Concerns regarding medicines are outlined above.  Orders Placed This Encounter  Procedures  . CT CORONARY MORPH W/CTA COR W/SCORE W/CA W/CM &/OR WO/CM  . CT CORONARY  FRACTIONAL FLOW RESERVE DATA PREP  . CT CORONARY FRACTIONAL FLOW RESERVE FLUID ANALYSIS  . Basic metabolic panel  . LONG TERM MONITOR (3-14 DAYS)   Meds ordered this encounter  Medications  . amLODipine (NORVASC) 10 MG tablet    Sig: Take 1 tablet (10 mg total) by mouth daily.    Dispense:  180 tablet    Refill:  3  . lisinopril (ZESTRIL) 40 MG tablet    Sig: Take 1 tablet (40 mg total) by mouth daily.    Dispense:  90 tablet    Refill:  3    Decrease to 40 mg daily  . diltiazem (CARDIZEM SR) 120 MG 12 hr capsule    Sig: Take 1 capsule (120 mg total) by mouth once for 1 dose. Take one hour prior to  Coronary CTA    Dispense:  1 capsule    Refill:  0  . esomeprazole (NEXIUM) 20 MG packet    Sig: Take 20 mg by mouth daily before breakfast.    Dispense:  30 each    Refill:  12    Patient Instructions  Medication Instructions:   STOP TAKING  PRILOSEC  START TAKING NEXIUM - ( ESOMEPRAZOLE)  20 MG  ONE TABLET DAILY   DECREASE LISINOPRIL TO 40 MG DAILY  START AMLODIPINE 10 MG DAILY   *If you need a refill on your cardiac medications before your next appointment, please call your pharmacy*  Lab Work: SEE INSTRUCTION FOR SEE CCTA  If you have labs (blood work) drawn today and your tests are completely normal, you will receive your results only by: Marland Kitchen. MyChart Message (if you have MyChart) OR . A paper copy in the mail If you have any lab test that is abnormal or we need to change your treatment, we will call you to review the results.  Testing/Procedures: Will be schedule at [facility] _ Radiololgy Your physician has requested that you have cardiac CT. Cardiac computed tomography (CT) is a painless test that uses an x-ray machine to take clear, detailed pictures of your heart. For further information please visit [email or web address] Please follow instruction sheet as given.    Will be schedule at [address] Your physician has recommended that you  wear a  7 DAY ZIO-PATCH monitor. The Zio patch cardiac monitor continuously records heart rhythm data for up to 14 days, this is for patients being evaluated for multiple types heart rhythms. For the first 24 hours post application, please avoid getting the Zio monitor wet in the shower or by excessive sweating during exercise. After that, feel free to carry on with regular activities. Keep soaps and lotions away from the ZIO XT Patch.  This will be mailed to you, please expect 7-10 days to receive.  placed at our [facility] location - [address].         Follow-Up: At [facility], you and your health needs are our priority.  As part of our continuing mission to provide you with exceptional heart care, we have created designated Provider Care Teams.  These Care Teams include your primary Cardiologist (physician) and Advanced Practice Providers (APPs -  Physician Assistants and Nurse Practitioners) who all work together to provide you with the care you need, when you need it.  Your next appointment:   2 MONTHS  The format for your next appointment:   In Person  Provider:   Lennie Odor, MD  Other Instructions      Your cardiac CT will be scheduled at one of the below locations:   [facility] [address] [place], [place] [address] [phone]    If scheduled at [facility], please arrive at the [facility] main entrance of [facility] 30-45 minutes prior to test start time. Proceed to the [facility] Radiology Department (first floor) to check-in and test prep.    Please follow these instructions carefully (unless otherwise directed):  PLEASE HAVE BMP ( LAB DONE ONE WEEK PRIOR TO SCHEDULE TEST)  On the Night Before the Test: . Be sure to Drink plenty of water. . Do not consume any caffeinated/decaffeinated beverages or chocolate 12 hours prior to your test. . Do not take any antihistamines 12 hours prior to your test.   On the Day of the Test: . Drink plenty of water. Do not drink any water within one hour of the test. . Do not eat any food 4 hours prior to the test. . You may take your regular medications prior to the test.  . Take DILTAIZEM 120 MG  ONE  hour prior to test. . HOLD Furosemide/Hydrochlorothiazide morning of the test. . FEMALES- please wear underwire-free bra if available          After the Test: . Drink plenty of water. . After receiving IV contrast, you may experience a mild flushed feeling. This is normal. . On occasion, you may experience a mild rash up to 24 hours after the test. This is not dangerous. If this occurs, you can take Benadryl 25 mg and increase your fluid intake. . If you experience trouble breathing, this can be serious. If it is severe call 911 IMMEDIATELY. If it is mild, please call our office.    Once we have confirmed authorization from your insurance company, we will call you to set up a date and time for your test.   For non-scheduling related questions, please contact the cardiac imaging nurse navigator should you have any questions/concerns: Rockwell Alexandria, RN Navigator Cardiac Imaging [facility] Heart and Vascular Services [phone] Office         Signed, Lenna Gilford. Flora Lipps, MD [facility]  [address], [address] [place], [place] [address] [phone]  09/03/2019 11:04 AM

## 2019-09-03 ENCOUNTER — Other Ambulatory Visit: Payer: Self-pay

## 2019-09-03 ENCOUNTER — Encounter: Payer: Self-pay | Admitting: Cardiovascular Disease

## 2019-09-03 ENCOUNTER — Ambulatory Visit (INDEPENDENT_AMBULATORY_CARE_PROVIDER_SITE_OTHER): Payer: 59 | Admitting: Cardiovascular Disease

## 2019-09-03 VITALS — BP 156/85 | HR 97 | Ht 64.0 in | Wt 135.0 lb

## 2019-09-03 DIAGNOSIS — I10 Essential (primary) hypertension: Secondary | ICD-10-CM

## 2019-09-03 DIAGNOSIS — R079 Chest pain, unspecified: Secondary | ICD-10-CM | POA: Diagnosis not present

## 2019-09-03 DIAGNOSIS — R002 Palpitations: Secondary | ICD-10-CM

## 2019-09-03 DIAGNOSIS — Z720 Tobacco use: Secondary | ICD-10-CM

## 2019-09-03 DIAGNOSIS — R072 Precordial pain: Secondary | ICD-10-CM

## 2019-09-03 MED ORDER — DILTIAZEM HCL ER 120 MG PO CP12: 120.0000 mg | ORAL_CAPSULE | Freq: Once | ORAL | 0 refills | Status: DC

## 2019-09-03 MED ORDER — LISINOPRIL 40 MG PO TABS: 40.0000 mg | ORAL_TABLET | Freq: Every day | ORAL | 3 refills | Status: DC

## 2019-09-03 MED ORDER — AMLODIPINE BESYLATE 10 MG PO TABS: 10.0000 mg | ORAL_TABLET | Freq: Every day | ORAL | 3 refills | Status: DC

## 2019-09-03 MED ORDER — ESOMEPRAZOLE MAGNESIUM 20 MG PO PACK: 20.0000 mg | PACK | Freq: Every day | ORAL | 12 refills | Status: DC

## 2019-09-03 NOTE — Patient Instructions (Addendum)
Medication Instructions:   STOP TAKING  PRILOSEC  START TAKING NEXIUM - ( ESOMEPRAZOLE)  20 MG  ONE TABLET DAILY   DECREASE LISINOPRIL TO 40 MG DAILY  START AMLODIPINE 10 MG DAILY   *If you need a refill on your cardiac medications before your next appointment, please call your pharmacy*  Lab Work: SEE INSTRUCTION FOR SEE CCTA  If you have labs (blood work) drawn today and your tests are completely normal, you will receive your results only by: Marland Kitchen MyChart Message (if you have MyChart) OR . A paper copy in the mail If you have any lab test that is abnormal or we need to change your treatment, we will call you to review the results.  Testing/Procedures: Will be schedule at [facility] _ Radiololgy Your physician has requested that you have cardiac CT. Cardiac computed tomography (CT) is a painless test that uses an x-ray machine to take clear, detailed pictures of your heart. For further information please visit [email or web address] Please follow instruction sheet as given.    Will be schedule at [address] Your physician has recommended that you wear a  7 DAY ZIO-PATCH monitor. The Zio patch cardiac monitor continuously records heart rhythm data for up to 14 days, this is for patients being evaluated for multiple types heart rhythms. For the first 24 hours post application, please avoid getting the Zio monitor wet in the shower or by excessive sweating during exercise. After that, feel free to carry on with regular activities. Keep soaps and lotions away from the ZIO XT Patch.  This will be mailed to you, please expect 7-10 days to receive.  placed at our [facility] location - [address].         Follow-Up: At [facility], you and your health needs are our priority.  As part of our continuing mission to provide you with exceptional heart care, we have created designated Provider Care Teams.  These Care Teams include your primary  Cardiologist (physician) and Advanced Practice Providers (APPs -  Physician Assistants and Nurse Practitioners) who all work together to provide you with the care you need, when you need it.  Your next appointment:   2 MONTHS  The format for your next appointment:   In Person  Provider:   Lennie Odor, MD  Other Instructions      Your cardiac CT will be scheduled at one of the below locations:   [facility] [address] [place], [place] [address] [phone]    If scheduled at [facility], please arrive at the [facility] main entrance of [facility] 30-45 minutes prior to test start time. Proceed to the [facility] Radiology Department (first floor) to check-in and test prep.    Please follow these instructions carefully (unless otherwise directed):  PLEASE HAVE BMP ( LAB DONE ONE WEEK PRIOR TO SCHEDULE TEST)  On the Night Before the Test: . Be sure to Drink plenty of water. . Do not consume any caffeinated/decaffeinated beverages or chocolate 12 hours prior to your test. . Do not take any antihistamines 12 hours prior to your test.  On the Day of the Test: . Drink plenty of water. Do not drink any water within one hour of the test. . Do not eat any food 4 hours prior to the test. . You may take your regular medications prior to the test.  . Take DILTAIZEM 120 MG  ONE  hour prior to test. . HOLD Furosemide/Hydrochlorothiazide morning of the test. . FEMALES- please wear underwire-free bra if available          After the Test: . Drink plenty of water. . After receiving IV contrast, you may experience a mild flushed feeling. This is normal. . On occasion, you may experience a mild rash up to 24 hours after the test. This is not dangerous. If this occurs, you can take Benadryl 25 mg and increase your fluid intake. . If you experience trouble breathing, this can be serious. If it is severe call 911 IMMEDIATELY. If it is mild, please  call our office.    Once we have confirmed authorization from your insurance company, we will call you to set up a date and time for your test.   For non-scheduling related questions, please contact the cardiac imaging nurse navigator should you have any questions/concerns: Marchia Bond, RN Navigator Cardiac Imaging [facility] [phone] Office

## 2019-09-04 ENCOUNTER — Other Ambulatory Visit: Payer: Self-pay

## 2019-09-04 MED ORDER — ESOMEPRAZOLE MAGNESIUM 20 MG PO CPDR: 20.0000 mg | DELAYED_RELEASE_CAPSULE | Freq: Every day | ORAL | 12 refills | Status: DC

## 2019-09-10 MED FILL — BUPRENORPHINE HCL-NALOXONE: 8-2 | 28 days supply | Qty: 84 | Fill #0

## 2019-09-10 MED FILL — PROMETHAZINE 25 MG TABLET: 25 | 30 days supply | Qty: 90 | Fill #0

## 2019-09-11 ENCOUNTER — Telehealth (HOSPITAL_COMMUNITY): Payer: Self-pay | Admitting: Emergency Medicine

## 2019-09-11 NOTE — Telephone Encounter (Signed)
Unable to leave VM as VM box is full.

## 2019-09-12 ENCOUNTER — Other Ambulatory Visit: Payer: Self-pay

## 2019-09-12 ENCOUNTER — Ambulatory Visit (HOSPITAL_COMMUNITY)
Admission: RE | Admit: 2019-09-12 | Discharge: 2019-09-12 | Disposition: A | Discharge status: Home / Self Care | Payer: 59 | Source: Ambulatory Visit | Attending: Cardiovascular Disease | Admitting: Cardiovascular Disease

## 2019-09-12 DIAGNOSIS — R072 Precordial pain: Secondary | ICD-10-CM

## 2019-09-12 LAB — BASIC METABOLIC PANEL
BUN/Creatinine Ratio: 13 (ref 12–28)
BUN: 12 mg/dL (ref 8–27)
CO2: 23 mmol/L (ref 20–29)
Calcium: 9.5 mg/dL (ref 8.7–10.3)
Chloride: 101 mmol/L (ref 96–106)
Creatinine, Ser: 0.94 mg/dL (ref 0.57–1.00)
GFR calc Af Amer: 76 mL/min/{1.73_m2} (ref >59)
GFR calc non Af Amer: 66 mL/min/{1.73_m2} (ref >59)
Glucose: 121 mg/dL — ABNORMAL HIGH (ref 65–99)
Potassium: 4.4 mmol/L (ref 3.5–5.2)
Sodium: 140 mmol/L (ref 134–144)

## 2019-09-12 NOTE — Progress Notes (Signed)
Pt here for CT  Heart. HR 99-100. Pt states she took her Diltiazem 1hr before appointment. Pt also states she had an asthma attack earlier and had to use her Albuterol. Pt is very anxious. States she takes Klonopin but is out of it. States she has a lot going on with her sick husband and taking care of him and is also very nervous about this test.  I explained to pt we need her HR to be 60 or lower for test and I would be given her medication to lower it. She states she does not think her HR will go down with the medication because she is so anxious. I told pt we can reschedule and maybe give her another medication at home to get her HR down and also maybe some anxiety medication to take before coming as long as she has a ride. Pt states she thinks that would work and thinks it is better to reschedule. I will call Cardiac navigator and have here reschedule and speak with pts physician regarding medications to order. Pt was in agreement with plan and will wait for call. D/C home walking in no distress.

## 2019-09-13 ENCOUNTER — Telehealth: Payer: Self-pay

## 2019-09-13 NOTE — Telephone Encounter (Signed)
Spoke to pt. Went over monitor instructions. Verified address. 7 day ZIO ordered.

## 2019-09-27 ENCOUNTER — Ambulatory Visit (INDEPENDENT_AMBULATORY_CARE_PROVIDER_SITE_OTHER): Payer: 59

## 2019-09-27 DIAGNOSIS — R002 Palpitations: Secondary | ICD-10-CM

## 2019-09-27 DIAGNOSIS — R072 Precordial pain: Secondary | ICD-10-CM | POA: Diagnosis not present

## 2019-09-27 DIAGNOSIS — R079 Chest pain, unspecified: Secondary | ICD-10-CM | POA: Diagnosis not present

## 2019-10-08 MED FILL — BUPRENORPHINE HCL-NALOXONE: 8-2 | 28 days supply | Qty: 84 | Fill #0

## 2019-10-08 MED FILL — PROMETHAZINE 25 MG TABLET: 25 | 30 days supply | Qty: 90 | Fill #0

## 2019-11-03 NOTE — Progress Notes (Deleted)
Cardiology Office Note:   Date:  11/03/2019  NAME:  [name]    MRN: [id number] DOB:  1961-01-01   PCP:  Roger Kill, PA-C  Cardiologist:  Reatha Harps, MD   Referring MD: Roger Kill, *   No chief complaint on file. ***  History of Present Illness:   [name] is a 58 y.o. female with a hx of anxiety, COPD, diabetes, TIA/CVA who presents for follow-up of chest pain and palpitations.  She is undergone extensive evaluation with an echocardiogram which is normal, nuclear medicine MPI study which was normal, as well as recent ZIO patch which showed no significant arrhythmias.  I suspect most of her symptoms are due to anxiety.  Her echocardiogram did show mild mitral regurgitation related to what appears to be anterior mitral valve leaflet prolapse.  Her left atrium was mild to moderately dilated.   ***Needs TSH checked, and lipids****    Past Medical History: Past Medical History:  Diagnosis Date  . Anxiety   . Asthma    Albuterol prn   . CHF (congestive heart failure) (HCC)    takes Furosemide daily  . Chronic back pain    DDD/scoliosis  . Complication of anesthesia    hard to wake up  . COPD (chronic obstructive pulmonary disease) (HCC)   . DDD (degenerative disc disease)   . Depression    takes Celexa,Clonazepam daily  . Diabetes mellitus    borderline  . Diverticulitis   . Edema of lower extremity   . Emphysema lung (HCC)   . Fibromyalgia   . Gastritis   . GERD (gastroesophageal reflux disease)    takes Pepcid daily  . Gout   . Gout of big toe    RIGHT  . Hemorrhoids   . Hiatal hernia   . History of bronchitis    last time 4-73months ago   . History of colon polyps   . History of kidney stones    is in her left kidney and has been for couple of yrs and no problems  . HTN (hypertension)    takes lisinopril daily  . Hyperlipidemia    but doesn't take any meds  . Hypothyroidism    takes Synthroid daily  . Insomnia    doesn't take any meds for this  . Joint pain   . Joint swelling   . Migraine    last 04/04/18  . Migraine   . MS (multiple sclerosis) (HCC)    early stages  . Muscle spasm    takes Flexeril daily as needed  . Neuropathy   . Osteoporosis   . Phlebitis   . Pneumonia    hx of;last time in 2014  . PONV (postoperative nausea and vomiting)   . Shortness of breath    with exertion or stressed   . Sleep apnea    sleep study done at least 24yrs ago;has CPAP but doesn't use  . Stroke [facility])    left sided weakness  . TIA (transient ischemic attack)    Hx: of  . Urinary frequency     Past Surgical History: Past Surgical History:  Procedure Laterality Date  . ABDOMINAL HYSTERECTOMY    . ANTERIOR CERVICAL DECOMP/DISCECTOMY FUSION N/A 12/30/2013   Procedure: ANTERIOR CERVICAL DISCECTOMY FUSION C4-5, C5-6, plate and screws, allograft, local bone graft;  Surgeon: Kerrin Champagne, MD;  Location: [facility];  Service: Orthopedics;  Laterality: N/A;  . BACK SURGERY     lumbar  discectomy  . BIOPSY N/A 01/20/2015   Procedure: COLON BIOPSY;  Surgeon: Danie Binder, MD;  Location: [facility];  Service: Endoscopy;  Laterality: N/A;  . BIOPSY  05/22/2018   Procedure: BIOPSY;  Surgeon: Danie Binder, MD;  [facility];  Service: Endoscopy;;  gastric  . BLADDER SUSPENSION    . CARDIAC CATHETERIZATION    . CARPAL TUNNEL RELEASE Right   . CHOLECYSTECTOMY    . COLONOSCOPY    . COLONOSCOPY WITH PROPOFOL N/A 01/20/2015   SLF: 1. Normal ileum 2. Redundant left colon 3. Smalll internal hemorrhoids.   . ESOPHAGOGASTRODUODENOSCOPY  10/29/07   normal  . ESOPHAGOGASTRODUODENOSCOPY (EGD) WITH PROPOFOL N/A 05/22/2018   Procedure: ESOPHAGOGASTRODUODENOSCOPY (EGD) WITH PROPOFOL;  Surgeon: Danie Binder, MD;  [facility];  Service: Endoscopy;  Laterality: N/A;  9:30am  . fundic gland polyp     benign  . POLYPECTOMY N/A 01/20/2015   Procedure: RECTAL POLYPECTOMY;  Surgeon: Danie Binder, MD;   Location: [facility];  Service: Endoscopy;  Laterality: N/A;  . SIGMOIDOSCOPY  01/31/08   large internal hemorrhoids/small rectal polyp removed/rare sigmoid diverticula    Current Medications: No outpatient medications have been marked as taking for the 11/04/19 encounter (Appointment) with Geralynn Rile, MD.     Allergies:    Demerol [meperidine], Imitrex [sumatriptan], Penicillins, Sulfa antibiotics, Abilify [aripiprazole], Levaquin [levofloxacin in d5w], Neurontin [gabapentin], Toradol [ketorolac tromethamine], Tramadol, Ceclor [cefaclor], and Doxycycline   Social History: Social History   Socioeconomic History  . Marital status: Married    Spouse name: Not on file  . Number of children: Not on file  . Years of education: Not on file  . Highest education level: Not on file  Occupational History  . Not on file  Tobacco Use  . Smoking status: Current Some Day Smoker    Packs/day: 0.75    Years: 15.00    Pack years: 11.25    Types: Cigarettes  . Smokeless tobacco: Never Used  Substance and Sexual Activity  . Alcohol use: No  . Drug use: No  . Sexual activity: Yes    Birth control/protection: Surgical  Other Topics Concern  . Not on file  Social History Narrative   Patient is married and has children.  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  Denies any alcohol or other drug use.  Reports that 1 of her children is also dealing with narcotic addiction and also goes to the Suboxone clinic with her.   Eats meat fruits and vegetables.   Currently smokes.   Not interested in quitting smoking at this time.   Wears seatbelt.   Social Determinants of Health   Financial Resource Strain:   . Difficulty of Paying Living Expenses: Not on file  Food Insecurity:   . Worried About Charity fundraiser in the Last Year: Not on file  . Ran Out of Food in the Last Year: Not on file  Transportation Needs:   . Lack of Transportation (Medical): Not on  file  . Lack of Transportation (Non-Medical): Not on file  Physical Activity:   . Days of Exercise per Week: Not on file  . Minutes of Exercise per Session: Not on file  Stress:   . Feeling of Stress : Not on file  Social Connections:   . Frequency of Communication with Friends and Family: Not on file  . Frequency of Social Gatherings with Friends and Family: Not on  file  . Attends Religious Services: Not on file  . Active Member of Clubs or Organizations: Not on file  . Attends Banker Meetings: Not on file  . Marital Status: Not on file     Family History: The patient's ***family history includes Arthritis in her father; Cancer - Lung in an other family member; Cancer - Other in an other family member; Cancer - Prostate in an other family member; Colon cancer in her brother and paternal uncle; Diabetes in her mother and sister; Heart disease in her sister; Heart failure in her father; Hypertension in her mother and sister; Neuropathy in her sister; Transient ischemic attack in her sister.  ROS:   All other ROS reviewed and negative. Pertinent positives noted in the HPI.     EKGs/Labs/Other Studies Reviewed:   The following studies were personally reviewed by me today:  EKG:  EKG is *** ordered today.  The ekg ordered today demonstrates ***, and was personally reviewed by me.   Zio 09/27/2019 Patient had a min HR of 64 bpm (normal sinus rhythm), max HR of 131 bpm (sinus tachycardia), and avg HR of 87 bpm (normal sinus rhythm). Predominant underlying rhythm was Sinus Rhythm. Isolated SVEs were rare (<1.0%), SVE Couplets were rare (<1.0%), and no SVE Triplets were present. Isolated VEs were rare (<1.0%), and no VE Couplets or VE Triplets were present. No atrial fibrillation.   Patient diary summarized below:  09/27/2019 6:33 PM: Symptoms of anxious, chest pain/pressure, light headed <1 min coincided with normal sinus rhythm 88 bpm.   Impression:  1. No sustained  arrhythmias to explain symptoms.  2. No atrial fibrillation.  3. Rare ectopy.    TTE 08/21/2019  1. Left ventricular ejection fraction, by visual estimation, is 60 to 65%. The left ventricle has normal function. There is no left ventricular hypertrophy.  2. Left ventricular diastolic Doppler parameters are consistent with impaired relaxation pattern of LV diastolic filling.  3. Global right ventricle has normal systolic function.The right ventricular size is normal. No increase in right ventricular wall thickness.  4. Left atrial size was moderately dilated.  5. Right atrial size was normal.  6. The mitral valve is abnormal. Mild mitral valve regurgitation.  7. The tricuspid valve is grossly normal. Tricuspid valve regurgitation is trivial.  8. The aortic valve is tricuspid Aortic valve regurgitation was not visualized by color flow Doppler.  9. The pulmonic valve was grossly normal. Pulmonic valve regurgitation is trivial by color flow Doppler. 10. Normal pulmonary artery systolic pressure. 11. The inferior vena cava is normal in size with greater than 50% respiratory variability, suggesting right atrial pressure of 3 mmHg.  LE Duplex: Right: Resting right ankle-brachial index is within normal range. No evidence of significant right lower extremity arterial disease. The right toe-brachial index is normal. Left: Resting left ankle-brachial index is within normal range. No evidence of significant left lower extremity arterial disease. The left toe-brachial index is normal.  NM Stress 08/29/2019  The left ventricular ejection fraction is normal (55-65%).  Nuclear stress EF: 63%.  There was no ST segment deviation noted during stress.  The study is normal.  This is a low risk study.  No prior study for comparison.  Recent Labs: 05/17/2019: ALT 16; Hemoglobin 13.2; Platelets 191 05/18/2019: Magnesium 1.6 09/11/2019: BUN 12; Creatinine, Ser 0.94; Potassium 4.4; Sodium 140   Recent  Lipid Panel    Component Value Date/Time   CHOL 272 (H) 09/11/2017 1314   TRIG 116  09/11/2017 1314   HDL 40 (L) 09/11/2017 1314   CHOLHDL 6.8 (H) 09/11/2017 1314   VLDL 49 (H) 04/06/2015 0714   LDLCALC 207 (H) 09/11/2017 1314    Physical Exam:   VS:  There were no vitals taken for this visit.   Wt Readings from Last 3 Encounters:  09/03/19 135 lb (61.2 kg)  08/29/19 136 lb (61.7 kg)  08/14/19 136 lb 12.8 oz (62.1 kg)    General: Well nourished, well developed, in no acute distress Heart: Atraumatic, normal size  Eyes: PEERLA, EOMI  Neck: Supple, no JVD Endocrine: No thryomegaly Cardiac: Normal S1, S2; RRR; no murmurs, rubs, or gallops Lungs: Clear to auscultation bilaterally, no wheezing, rhonchi or rales  Abd: Soft, nontender, no hepatomegaly  Ext: No edema, pulses 2+ Musculoskeletal: No deformities, BUE and BLE strength normal and equal Skin: Warm and dry, no rashes   Neuro: Alert and oriented to person, place, time, and situation, CNII-XII grossly intact, no focal deficits  Psych: Normal mood and affect   ASSESSMENT:   [name] is a 58 y.o. female who presents for the following: No diagnosis found.  PLAN:   There are no diagnoses linked to this encounter.  Disposition: No follow-ups on file.  Medication Adjustments/Labs and Tests Ordered: Current medicines are reviewed at length with the patient today.  Concerns regarding medicines are outlined above.  No orders of the defined types were placed in this encounter.  No orders of the defined types were placed in this encounter.   There are no Patient Instructions on file for this visit.   Signed, Lenna GilfordWesley T. Flora Lipps'Neal, MD [facility]  [facility]  [address], [address] [place], [place] [address] [phone]  11/03/2019 9:50 AM

## 2019-11-04 ENCOUNTER — Ambulatory Visit: Payer: 59 | Admitting: Cardiovascular Disease

## 2019-11-04 ENCOUNTER — Telehealth: Payer: Self-pay | Admitting: Cardiovascular Disease

## 2019-11-04 NOTE — Telephone Encounter (Signed)
Let [name] know her monitor was normal. Will re-schedule her missed appointment for a virtual visit in 1-2 weeks with me. We need to discuss cholesterol. Recently started on crestor. Needs a re-check.  [place] T. Audie Box, [place]  [address], [place], [place] [address] [phone]  12:22 PM

## 2019-11-05 MED FILL — PROMETHAZINE 25 MG TABLET: 25 | 30 days supply | Qty: 90 | Fill #0

## 2019-11-05 MED FILL — BUPRENORPHINE HCL-NALOXONE: 8-2 | 28 days supply | Qty: 84 | Fill #0

## 2019-11-12 NOTE — Progress Notes (Unsigned)
{Choose 1 Note Type (Telehealth Visit or Telephone Visit):[phone]}   Date:  11/12/2019   ID:  [name], DOB 1961/06/28, MRN [id number]  {Patient Location:[phone]::"Home"} {Provider Location:[phone]::"Home"}  PCP:  Nichole Bene, [name]  Cardiologist:  Nichole Field, [name] *** Electrophysiologist:  None   Evaluation Performed:  {Choose Visit [phone]::"Follow-Up Visit"}  Chief Complaint: Chest pain  History of Present Illness:    [name] is a 59 y.o. female with anxiety, asthma, COPD, diabetes who presents for follow-up of chest pain.  She recently had a normal echocardiogram, normal nuclear medicine stress test.  She also complained of palpitations.  Her 7-day monitor showed no significant arrhythmias.  She continues to complain of chest pain.  T Chol 272, HDL 40, LDL 207 TG 116 A1c 5.4 2018 ***Needs re-check cholesterol, A1c***  The patient {does/does not:[phone]} have symptoms concerning for COVID-19 infection (fever, chills, cough, or new shortness of breath).    Past Medical History:  Diagnosis Date  . Anxiety   . Asthma    Albuterol prn   . CHF (congestive heart failure) ([place])    takes Furosemide daily  . Chronic back pain    DDD/scoliosis  . Complication of anesthesia    hard to wake up  . COPD (chronic obstructive pulmonary disease) ([place])   . DDD (degenerative disc disease)   . Depression    takes Celexa,Clonazepam daily  . Diabetes mellitus    borderline  . Diverticulitis   . Edema of lower extremity   . Emphysema lung ([place])   . Fibromyalgia   . Gastritis   . GERD (gastroesophageal reflux disease)    takes Pepcid daily  . Gout   . Gout of big toe    RIGHT  . Hemorrhoids   . Hiatal hernia   . History of bronchitis    last time 4-85months ago   . History of colon polyps   . History of kidney stones    is in her left kidney and has been for couple of yrs and no problems  . HTN (hypertension)    takes lisinopril daily  .  Hyperlipidemia    but doesn't take any meds  . Hypothyroidism    takes Synthroid daily  . Insomnia    doesn't take any meds for this  . Joint pain   . Joint swelling   . Migraine    last 04/04/18  . Migraine   . MS (multiple sclerosis) ([place])    early stages  . Muscle spasm    takes Flexeril daily as needed  . Neuropathy   . Osteoporosis   . Phlebitis   . Pneumonia    hx of;last time in 2014  . PONV (postoperative nausea and vomiting)   . Shortness of breath    with exertion or stressed   . Sleep apnea    sleep study done at least 51yrs ago;has CPAP but doesn't use  . Stroke [facility])    left sided weakness  . TIA (transient ischemic attack)    Hx: of  . Urinary frequency    Past Surgical History:  Procedure Laterality Date  . ABDOMINAL HYSTERECTOMY    . ANTERIOR CERVICAL DECOMP/DISCECTOMY FUSION N/A 12/30/2013   Procedure: ANTERIOR CERVICAL DISCECTOMY FUSION C4-5, C5-6, plate and screws, allograft, local bone graft;  Surgeon: Nichole Oto, [name];  Location: [facility];  Service: Orthopedics;  Laterality: N/A;  . BACK SURGERY     lumbar discectomy  . BIOPSY N/A 01/20/2015  Procedure: COLON BIOPSY;  Surgeon: [place] Bali, [name];  Location: [facility];  Service: Endoscopy;  Laterality: N/A;  . BIOPSY  05/22/2018   Procedure: BIOPSY;  Surgeon: [place] Bali, [name];  [facility];  Service: Endoscopy;;  gastric  . BLADDER SUSPENSION    . CARDIAC CATHETERIZATION    . CARPAL TUNNEL RELEASE Right   . CHOLECYSTECTOMY    . COLONOSCOPY    . COLONOSCOPY WITH PROPOFOL N/A 01/20/2015   SLF: 1. Normal ileum 2. Redundant left colon 3. Smalll internal hemorrhoids.   . ESOPHAGOGASTRODUODENOSCOPY  10/29/07   normal  . ESOPHAGOGASTRODUODENOSCOPY (EGD) WITH PROPOFOL N/A 05/22/2018   Procedure: ESOPHAGOGASTRODUODENOSCOPY (EGD) WITH PROPOFOL;  Surgeon: West Bali, [name];  [facility];  Service: Endoscopy;  Laterality: N/A;  9:30am  . fundic gland polyp     benign  . POLYPECTOMY N/A  01/20/2015   Procedure: RECTAL POLYPECTOMY;  Surgeon: West Bali, [name];  Location: [facility];  Service: Endoscopy;  Laterality: N/A;  . SIGMOIDOSCOPY  01/31/08   large internal hemorrhoids/small rectal polyp removed/rare sigmoid diverticula     No outpatient medications have been marked as taking for the 11/13/19 encounter (Appointment) with Nichole Rives, [name].     Allergies:   Demerol [meperidine], Imitrex [sumatriptan], Penicillins, Sulfa antibiotics, Abilify [aripiprazole], Levaquin [levofloxacin in d5w], Neurontin [gabapentin], Toradol [ketorolac tromethamine], Tramadol, Ceclor [cefaclor], and Doxycycline   Social History   Tobacco Use  . Smoking status: Current Some Day Smoker    Packs/day: 0.75    Years: 15.00    Pack years: 11.25    Types: Cigarettes  . Smokeless tobacco: Never Used  Substance Use Topics  . Alcohol use: No  . Drug use: No     Family Hx: The patient's family history includes Arthritis in her father; Cancer - Lung in an other family member; Cancer - Other in an other family member; Cancer - Prostate in an other family member; Colon cancer in her brother and paternal uncle; Diabetes in her mother and sister; Heart disease in her sister; Heart failure in her father; Hypertension in her mother and sister; Neuropathy in her sister; Transient ischemic attack in her sister.  ROS:   Please see the history of present illness.    *** All other systems reviewed and are negative.   Prior CV studies:   The following studies were reviewed today:  ***  Labs/Other Tests and Data Reviewed:    EKG:  {EKG/Telemetry Strips Reviewed:[phone]}   7 day Zio 11/03/2019 1. No sustained arrhythmias to explain symptoms.  2. No atrial fibrillation.  3. Rare ectopy.   TTE   1. Left ventricular ejection fraction, by visual estimation, is 60 to 65%. The left ventricle has normal function. There is no left ventricular hypertrophy.  2. Left ventricular diastolic Doppler  parameters are consistent with impaired relaxation pattern of LV diastolic filling.  3. Global right ventricle has normal systolic function.The right ventricular size is normal. No increase in right ventricular wall thickness.  4. Left atrial size was moderately dilated.  5. Right atrial size was normal.  6. The mitral valve is abnormal. Mild mitral valve regurgitation.  7. The tricuspid valve is grossly normal. Tricuspid valve regurgitation is trivial.  8. The aortic valve is tricuspid Aortic valve regurgitation was not visualized by color flow Doppler.  9. The pulmonic valve was grossly normal. Pulmonic valve regurgitation is trivial by color flow Doppler. 10. Normal pulmonary artery systolic pressure. 11. The inferior  vena cava is normal in size with greater than 50% respiratory variability, suggesting right atrial pressure of 3 mmHg.  NM MPI 08/29/2019  The left ventricular ejection fraction is normal (55-65%).  Nuclear stress EF: 63%.  There was no ST segment deviation noted during stress.  The study is normal.  This is a low risk study.  No prior study for comparison.  LE VASC US Right: Resting right ankle-brachial index is within normal range. No evidence of significant right lower extremity arterial disease. The right toe-brachial index is normal.  Left: Resting left ankle-brachial index is within normal range. No evidence of significant left lower extremity arterial disease. The left toe-brachial index is normal.  Recent Labs: 05/17/2019: ALT 16; Hemoglobin 13.2; Platelets 191 05/18/2019: Magnesium 1.6 09/11/2019: BUN 12; Creatinine, Ser 0.94; Potassium 4.4; Sodium 140   Recent Lipid Panel Lab Results  Component Value Date/Time   CHOL 272 (H) 09/11/2017 01:14 PM   TRIG 116 09/11/2017 01:14 PM   HDL 40 (L) 09/11/2017 01:14 PM   CHOLHDL 6.8 (H) 09/11/2017 01:14 PM   LDLCALC 207 (H) 09/11/2017 01:14 PM    Wt Readings from Last 3 Encounters:  09/03/19 135 lb (61.2 kg)   08/29/19 136 lb (61.7 kg)  08/14/19 136 lb 12.8 oz (62.1 kg)     Objective:    Vital Signs:  There were no vitals taken for this visit.   {[facility] Virtual Exam (Optional):[phone]::"VITAL SIGNS:  reviewed"}  ASSESSMENT & PLAN:    1. ***  COVID-19 Education: The signs and symptoms of COVID-19 were discussed with the patient and how to seek care for testing (follow up with PCP or arrange E-visit).  ***The importance of social distancing was discussed today.  Time:   Today, I have spent *** minutes with the patient with telehealth technology discussing the above problems.     Medication Adjustments/Labs and Tests Ordered: Current medicines are reviewed at length with the patient today.  Concerns regarding medicines are outlined above.   Tests Ordered: No orders of the defined types were placed in this encounter.   Medication Changes: No orders of the defined types were placed in this encounter.   Follow Up:  {F/U [phone]} {follow up:15908}  Signed, Reatha Harps, [name]  11/12/2019 6:42 AM     [facility]

## 2019-11-13 ENCOUNTER — Telehealth: Payer: 59 | Admitting: Cardiovascular Disease

## 2019-11-13 DIAGNOSIS — R072 Precordial pain: Secondary | ICD-10-CM

## 2019-11-13 DIAGNOSIS — Z720 Tobacco use: Secondary | ICD-10-CM

## 2019-11-13 DIAGNOSIS — I10 Essential (primary) hypertension: Secondary | ICD-10-CM

## 2019-11-13 DIAGNOSIS — R002 Palpitations: Secondary | ICD-10-CM

## 2019-11-13 DIAGNOSIS — E782 Mixed hyperlipidemia: Secondary | ICD-10-CM

## 2019-11-17 NOTE — Progress Notes (Signed)
Virtual Visit via Telephone Note   This visit type was conducted due to national recommendations for restrictions regarding the COVID-19 Pandemic (e.g. social distancing) in an effort to limit this patient's exposure and mitigate transmission in our community.  Due to her co-morbid illnesses, this patient is at least at moderate risk for complications without adequate follow up.  This format is felt to be most appropriate for this patient at this time.  The patient did not have access to video technology/had technical difficulties with video requiring transitioning to audio format only (telephone).  All issues noted in this document were discussed and addressed.  No physical exam could be performed with this format.  Please refer to the patient's chart for her  consent to telehealth for [facility].   Date:  11/19/2019   ID:  [name], DOB [birth year], MRN [id number]  Patient Location: Home Provider Location: Office  PCP:  Roger Kill, PA-C  Cardiologist:  Reatha Harps, MD   Evaluation Performed:  Follow-Up Visit  Chief Complaint:  Chest pain  History of Present Illness:    [name] is a 59 y.o. female with a hx of diabetes, COPD, anxiety who presents for follow-up of chest pain.  Her testing has been unremarkable.  She underwent an echocardiogram which showed normal LV function.  She also had myocardial perfusion imaging nuclear medicine stress test which showed no evidence of ischemia.  She continues to have intermittent episodes of sharp left-sided chest pain.  The pain is reported as sharp and stabbing-like.  It occurs 1-2 times per week.  Occurs while sitting or with exertion.  There is associated anxiety and depression.  She reports it lasts 1 to 2 minutes before resolving.  She states it responds well to NSAIDs and Tylenol.  She also has taken nitroglycerin intermittently with improvement in her pain.  I discussed with her that her above cardiac testing was  normal.  She has no evidence of ischemia on her stress test.  She also reports significant stress and depression in her life.  Her husband was recently laid off.  I think this could be contributing to her symptoms.  She seems to be having less frequent symptoms as of lately.  Recent review of her laboratory data shows a significantly elevated LDL cholesterol from 2018.  No repeat in system.  She also had a very elevated TSH has not been rechecked.  Remote blood pressure check shows a bit better than prior values.  She is still smoking but down to 10 cigarettes/day.  I encouraged her to quit completely.  Problem List 1. COPD 2. Diabetes 3. Anxiety  4. HTN 5. Chest pain -normal echo -normal MPI  The patient does not have symptoms concerning for COVID-19 infection (fever, chills, cough, or new shortness of breath).    Past Medical History:  Diagnosis Date  . Anxiety   . Asthma    Albuterol prn   . CHF (congestive heart failure) (HCC)    takes Furosemide daily  . Chronic back pain    DDD/scoliosis  . Complication of anesthesia    hard to wake up  . COPD (chronic obstructive pulmonary disease) (HCC)   . DDD (degenerative disc disease)   . Depression    takes Celexa,Clonazepam daily  . Diabetes mellitus    borderline  . Diverticulitis   . Edema of lower extremity   . Emphysema lung (HCC)   . Fibromyalgia   . Gastritis   .  GERD (gastroesophageal reflux disease)    takes Pepcid daily  . Gout   . Gout of big toe    RIGHT  . Hemorrhoids   . Hiatal hernia   . History of bronchitis    last time 4-33months ago   . History of colon polyps   . History of kidney stones    is in her left kidney and has been for couple of yrs and no problems  . HTN (hypertension)    takes lisinopril daily  . Hyperlipidemia    but doesn't take any meds  . Hypothyroidism    takes Synthroid daily  . Insomnia    doesn't take any meds for this  . Joint pain   . Joint swelling   . Migraine    last  04/04/18  . Migraine   . MS (multiple sclerosis) (HCC)    early stages  . Muscle spasm    takes Flexeril daily as needed  . Neuropathy   . Osteoporosis   . Phlebitis   . Pneumonia    hx of;last time in 2014  . PONV (postoperative nausea and vomiting)   . Shortness of breath    with exertion or stressed   . Sleep apnea    sleep study done at least 58yrs ago;has CPAP but doesn't use  . Stroke [facility])    left sided weakness  . TIA (transient ischemic attack)    Hx: of  . Urinary frequency    Past Surgical History:  Procedure Laterality Date  . ABDOMINAL HYSTERECTOMY    . ANTERIOR CERVICAL DECOMP/DISCECTOMY FUSION N/A 12/30/2013   Procedure: ANTERIOR CERVICAL DISCECTOMY FUSION C4-5, C5-6, plate and screws, allograft, local bone graft;  Surgeon: Kerrin Champagne, MD;  Location[facility];  Service: Orthopedics;  Laterality: N/A;  . BACK SURGERY     lumbar discectomy  . BIOPSY N/A 01/20/2015   Procedure: COLON BIOPSY;  Surgeon: West Bali, MD;  Location: [facility] ORS;  Service: Endoscopy;  Laterality: N/A;  . BIOPSY  05/22/2018   Procedure: BIOPSY;  Surgeon: West Bali, MD;  [facility];  Service: Endoscopy;;  gastric  . BLADDER SUSPENSION    . CARDIAC CATHETERIZATION    . CARPAL TUNNEL RELEASE Right   . CHOLECYSTECTOMY    . COLONOSCOPY    . COLONOSCOPY WITH PROPOFOL N/A 01/20/2015   SLF: 1. Normal ileum 2. Redundant left colon 3. Smalll internal hemorrhoids.   . ESOPHAGOGASTRODUODENOSCOPY  10/29/07   normal  . ESOPHAGOGASTRODUODENOSCOPY (EGD) WITH PROPOFOL N/A 05/22/2018   Procedure: ESOPHAGOGASTRODUODENOSCOPY (EGD) WITH PROPOFOL;  Surgeon: West Bali, MD;  [facility];  Service: Endoscopy;  Laterality: N/A;  9:30am  . fundic gland polyp     benign  . POLYPECTOMY N/A 01/20/2015   Procedure: RECTAL POLYPECTOMY;  Surgeon: West Bali, MD;  Location: [facility];  Service: Endoscopy;  Laterality: N/A;  . SIGMOIDOSCOPY  01/31/08   large internal hemorrhoids/small  rectal polyp removed/rare sigmoid diverticula     Current Meds  Medication Sig  . albuterol (VENTOLIN HFA) 108 (90 Base) MCG/ACT inhaler Inhale into the lungs.  . AMITIZA 24 MCG capsule   . amLODipine (NORVASC) 10 MG tablet Take 1 tablet (10 mg total) by mouth daily.  Marland Kitchen bismuth subsalicylate (PEPTO BISMOL) 262 MG chewable tablet Chew 524 mg by mouth as needed for indigestion or diarrhea or loose stools.  . Buprenorphine HCl-Naloxone HCl 8-2 MG FILM   . citalopram (CELEXA) 40 MG tablet  Take 40 mg by mouth daily.  . cyclobenzaprine (FLEXERIL) 10 MG tablet Take 1 tablet (10 mg total) by mouth 3 (three) times daily as needed for muscle spasms.  Marland Kitchen esomeprazole (NEXIUM) 20 MG capsule Take 1 capsule (20 mg total) by mouth daily at 12 noon.  Marland Kitchen levothyroxine (SYNTHROID) 175 MCG tablet Take 175 mcg by mouth daily before breakfast.  . lisinopril (ZESTRIL) 40 MG tablet Take 1 tablet (40 mg total) by mouth daily.  . promethazine (PHENERGAN) 25 MG tablet   . rosuvastatin (CRESTOR) 40 MG tablet Take 1 tablet (40 mg total) by mouth daily.  . traZODone (DESYREL) 150 MG tablet Take 150 mg by mouth at bedtime.      Allergies:   Demerol [meperidine], Imitrex [sumatriptan], Penicillins, Sulfa antibiotics, Abilify [aripiprazole], Levaquin [levofloxacin in d5w], Neurontin [gabapentin], Toradol [ketorolac tromethamine], Tramadol, Ceclor [cefaclor], and Doxycycline   Social History   Tobacco Use  . Smoking status: Current Some Day Smoker    Packs/day: 0.75    Years: 15.00    Pack years: 11.25    Types: Cigarettes  . Smokeless tobacco: Never Used  Substance Use Topics  . Alcohol use: No  . Drug use: No     Family Hx: The patient's family history includes Arthritis in her father; Cancer - Lung in an other family member; Cancer - Other in an other family member; Cancer - Prostate in an other family member; Colon cancer in her brother and paternal uncle; Diabetes in her mother and sister; Heart disease in  her sister; Heart failure in her father; Hypertension in her mother and sister; Neuropathy in her sister; Transient ischemic attack in her sister.  ROS:   Please see the history of present illness.     All other systems reviewed and are negative.   Prior CV studies:   The following studies were reviewed today:  TTE 11/18/2018  1. Left ventricular ejection fraction, by visual estimation, is 60 to 65%. The left ventricle has normal function. There is no left ventricular hypertrophy.  2. Left ventricular diastolic Doppler parameters are consistent with impaired relaxation pattern of LV diastolic filling.  3. Global right ventricle has normal systolic function.The right ventricular size is normal. No increase in right ventricular wall thickness.  4. Left atrial size was moderately dilated.  5. Right atrial size was normal.  6. The mitral valve is abnormal. Mild mitral valve regurgitation.  7. The tricuspid valve is grossly normal. Tricuspid valve regurgitation is trivial.  8. The aortic valve is tricuspid Aortic valve regurgitation was not visualized by color flow Doppler.  9. The pulmonic valve was grossly normal. Pulmonic valve regurgitation is trivial by color flow Doppler. 10. Normal pulmonary artery systolic pressure. 11. The inferior vena cava is normal in size with greater than 50% respiratory variability, suggesting right atrial pressure of 3 mmHg.  NM Stress 08/29/2019  The left ventricular ejection fraction is normal (55-65%).  Nuclear stress EF: 63%.  There was no ST segment deviation noted during stress.  The study is normal.  This is a low risk study.  No prior study for comparison.  Labs/Other Tests and Data Reviewed:    EKG:  No ECG reviewed.  Recent Labs: 05/17/2019: ALT 16; Hemoglobin 13.2; Platelets 191 05/18/2019: Magnesium 1.6 09/11/2019: BUN 12; Creatinine, Ser 0.94; Potassium 4.4; Sodium 140   Recent Lipid Panel Lab Results  Component Value Date/Time    CHOL 272 (H) 09/11/2017 01:14 PM   TRIG 116 09/11/2017 01:14 PM  HDL 40 (L) 09/11/2017 01:14 PM   CHOLHDL 6.8 (H) 09/11/2017 01:14 PM   LDLCALC 207 (H) 09/11/2017 01:14 PM    Wt Readings from Last 3 Encounters:  11/19/19 135 lb (61.2 kg)  09/03/19 135 lb (61.2 kg)  08/29/19 136 lb (61.7 kg)     Objective:    Vital Signs:  BP (!) 139/56   Pulse 99   Ht 5\' 5"  (1.651 m)   Wt 135 lb (61.2 kg)   BMI 22.47 kg/m    VITAL SIGNS:  reviewed  Gen: no acute distress Resp: Talking clear sentences, no shortness of breath CV: no palpitations reported  Ext: no edema reported  ASSESSMENT & PLAN:    1. Precordial pain -Recent negative stress test and normal ultrasound.  Appears to be anxiety and depression driven.  Improved with NSAIDs and Tylenol.  Also improved with nitroglycerin.  Appears to be happening less frequently.  I discussed with her that given her normal above testing this is likely just anxiety.  She will continue to monitor symptoms for now.  She is reassured that her symptoms are not cardiac related.  2. Essential hypertension -Continue current medications  3. Mixed hyperlipidemia -Severely elevated LDL on most recent check  4. Tobacco abuse -Still smoking.  Encouraged to quit.   COVID-19 Education: The signs and symptoms of COVID-19 were discussed with the patient and how to seek care for testing (follow up with PCP or arrange E-visit).  The importance of social distancing was discussed today.  Time:   Today, I have spent 25 minutes with the patient with telehealth technology discussing the above problems.     Medication Adjustments/Labs and Tests Ordered: Current medicines are reviewed at length with the patient today.  Concerns regarding medicines are outlined above.   Tests Ordered: Orders Placed This Encounter  Procedures  . Lipid Profile  . HgB A1c  . TSH    Medication Changes: No orders of the defined types were placed in this encounter.   Follow  Up:  In Person in 3 month(s)  Signed, , MD  11/19/2019 5:53 PM    [facility]

## 2019-11-19 ENCOUNTER — Telehealth: Payer: Self-pay | Admitting: Cardiovascular Disease

## 2019-11-19 ENCOUNTER — Telehealth (INDEPENDENT_AMBULATORY_CARE_PROVIDER_SITE_OTHER): Payer: 59 | Admitting: Cardiovascular Disease

## 2019-11-19 VITALS — BP 139/56 | HR 99 | Ht 65.0 in | Wt 135.0 lb

## 2019-11-19 DIAGNOSIS — I10 Essential (primary) hypertension: Secondary | ICD-10-CM

## 2019-11-19 DIAGNOSIS — Z720 Tobacco use: Secondary | ICD-10-CM | POA: Diagnosis not present

## 2019-11-19 DIAGNOSIS — R072 Precordial pain: Secondary | ICD-10-CM | POA: Diagnosis not present

## 2019-11-19 DIAGNOSIS — E782 Mixed hyperlipidemia: Secondary | ICD-10-CM

## 2019-11-19 NOTE — Telephone Encounter (Signed)
Called Mrs. Wirtanen. Switched to virtual.   Gerri Spore T. Flora Lipps, MD [facility]  [address] [place], [place] [address] [phone]  5:55 PM

## 2019-11-19 NOTE — Patient Instructions (Signed)
Medication Instructions:  NO CHANGES *If you need a refill on your cardiac medications before your next appointment, please call your pharmacy*  Lab Work: FASTING HGB A1C, TSH, AND LIPID PANEL If you have labs (blood work) drawn today and your tests are completely normal, you will receive your results only by: Marland Kitchen MyChart Message (if you have MyChart) OR . A paper copy in the mail If you have any lab test that is abnormal or we need to change your treatment, we will call you to review the results.   Follow-Up: At [facility], you and your health needs are our priority.  As part of our continuing mission to provide you with exceptional heart care, we have created designated Provider Care Teams.  These Care Teams include your primary Cardiologist (physician) and Advanced Practice Providers (APPs -  Physician Assistants and Nurse Practitioners) who all work together to provide you with the care you need, when you need it.  Your next appointment:   3 month(s)  The format for your next appointment:   In Person  Provider:   Lennie Odor, MD

## 2019-12-03 MED FILL — PROMETHAZINE 25 MG TABLET: 25 | 30 days supply | Qty: 90 | Fill #0

## 2019-12-03 MED FILL — BUPREN-NALOX FILM 8-2MG: 8-2 | 28 days supply | Qty: 84 | Fill #0

## 2019-12-31 MED FILL — BUPREN-NALOX FILM 8-2MG: 8-2 | 28 days supply | Qty: 84 | Fill #0

## 2019-12-31 MED FILL — PROMETHAZINE 25 MG TABLET: 25 | 30 days supply | Qty: 90 | Fill #0

## 2020-01-28 MED FILL — BUPRENORPHINE HCL-NALOXONE: 8-2 | 28 days supply | Qty: 84 | Fill #0

## 2020-01-28 MED FILL — PROMETHAZINE 25 MG TABLET: 25 | 30 days supply | Qty: 90 | Fill #0

## 2020-02-12 ENCOUNTER — Ambulatory Visit: Payer: 59 | Admitting: Cardiovascular Disease

## 2020-02-12 NOTE — Progress Notes (Deleted)
Cardiology Office Note:   Date:  02/12/2020  NAME:  [name]    MRN: [id number] DOB:  04/22/1961   PCP:  [name], PA-C  Cardiologist:  Evalina Field, MD  Electrophysiologist:  None   Referring MD: [name], *   No chief complaint on file. ***  History of Present Illness:   [name] is a 59 y.o. female with a hx of COPD, DM, HTN who presents for follow-up of chest pain. Had sharp chest pain at rest or with exertion. Normal MPI. We tried to get a cardiac CTA but she was too anxious.   Needs TSH, lipid profile, A1c   Problem List 1. COPD 2. Diabetes 3. Anxiety  4. HTN 5. Chest pain -normal echo -normal MPI 6. Tobacco abuse    Past Medical History: Past Medical History:  Diagnosis Date  . Anxiety   . Asthma    Albuterol prn   . CHF (congestive heart failure) ([place])    takes Furosemide daily  . Chronic back pain    DDD/scoliosis  . Complication of anesthesia    hard to wake up  . COPD (chronic obstructive pulmonary disease) ([place])   . DDD (degenerative disc disease)   . Depression    takes Celexa,Clonazepam daily  . Diabetes mellitus    borderline  . Diverticulitis   . Edema of lower extremity   . Emphysema lung ([place])   . Fibromyalgia   . Gastritis   . GERD (gastroesophageal reflux disease)    takes Pepcid daily  . Gout   . Gout of big toe    RIGHT  . Hemorrhoids   . Hiatal hernia   . History of bronchitis    last time 4-106months ago   . History of colon polyps   . History of kidney stones    is in her left kidney and has been for couple of yrs and no problems  . HTN (hypertension)    takes lisinopril daily  . Hyperlipidemia    but doesn't take any meds  . Hypothyroidism    takes Synthroid daily  . Insomnia    doesn't take any meds for this  . Joint pain   . Joint swelling   . Migraine    last 04/04/18  . Migraine   . MS (multiple sclerosis) ([place])    early stages  . Muscle spasm    takes Flexeril daily as  needed  . Neuropathy   . Osteoporosis   . Phlebitis   . Pneumonia    hx of;last time in 2014  . PONV (postoperative nausea and vomiting)   . Shortness of breath    with exertion or stressed   . Sleep apnea    sleep study done at least 68yrs ago;has CPAP but doesn't use  . Stroke [facility])    left sided weakness  . TIA (transient ischemic attack)    Hx: of  . Urinary frequency     Past Surgical History: Past Surgical History:  Procedure Laterality Date  . ABDOMINAL HYSTERECTOMY    . ANTERIOR CERVICAL DECOMP/DISCECTOMY FUSION N/A 12/30/2013   Procedure: ANTERIOR CERVICAL DISCECTOMY FUSION C4-5, C5-6, plate and screws, allograft, local bone graft;  Surgeon: Jessy Oto, MD;  Location: [facility];  Service: Orthopedics;  Laterality: N/A;  . BACK SURGERY     lumbar discectomy  . BIOPSY N/A 01/20/2015   Procedure: COLON BIOPSY;  Surgeon: Danie Binder, MD;  Location[facility];  Service: Endoscopy;  Laterality: N/A;  . BIOPSY  05/22/2018   Procedure: BIOPSY;  Surgeon: West Bali, MD;  [facility];  Service: Endoscopy;;  gastric  . BLADDER SUSPENSION    . CARDIAC CATHETERIZATION    . CARPAL TUNNEL RELEASE Right   . CHOLECYSTECTOMY    . COLONOSCOPY    . COLONOSCOPY WITH PROPOFOL N/A 01/20/2015   SLF: 1. Normal ileum 2. Redundant left colon 3. Smalll internal hemorrhoids.   . ESOPHAGOGASTRODUODENOSCOPY  10/29/07   normal  . ESOPHAGOGASTRODUODENOSCOPY (EGD) WITH PROPOFOL N/A 05/22/2018   Procedure: ESOPHAGOGASTRODUODENOSCOPY (EGD) WITH PROPOFOL;  Surgeon: West Bali, MD;  [facility];  Service: Endoscopy;  Laterality: N/A;  9:30am  . fundic gland polyp     benign  . POLYPECTOMY N/A 01/20/2015   Procedure: RECTAL POLYPECTOMY;  Surgeon: West Bali, MD;  Location: [facility];  Service: Endoscopy;  Laterality: N/A;  . SIGMOIDOSCOPY  01/31/08   large internal hemorrhoids/small rectal polyp removed/rare sigmoid diverticula    Current Medications: No outpatient  medications have been marked as taking for the 02/12/20 encounter (Appointment) with Sande Rives, MD.     Allergies:    Demerol [meperidine], Imitrex [sumatriptan], Penicillins, Sulfa antibiotics, Abilify [aripiprazole], Levaquin [levofloxacin in d5w], Neurontin [gabapentin], Toradol [ketorolac tromethamine], Tramadol, Ceclor [cefaclor], and Doxycycline   Social History: Social History   Socioeconomic History  . Marital status: Married    Spouse name: Not on file  . Number of children: Not on file  . Years of education: Not on file  . Highest education level: Not on file  Occupational History  . Not on file  Tobacco Use  . Smoking status: Current Some Day Smoker    Packs/day: 0.75    Years: 15.00    Pack years: 11.25    Types: Cigarettes  . Smokeless tobacco: Never Used  Substance and Sexual Activity  . Alcohol use: No  . Drug use: No  . Sexual activity: Yes    Birth control/protection: Surgical  Other Topics Concern  . Not on file  Social History Narrative   Patient is married and has children.  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  Denies any alcohol or other drug use.  Reports that 1 of her children is also dealing with narcotic addiction and also goes to the Suboxone clinic with her.   Eats meat fruits and vegetables.   Currently smokes.   Not interested in quitting smoking at this time.   Wears seatbelt.   Social Determinants of Health   Financial Resource Strain:   . Difficulty of Paying Living Expenses:   Food Insecurity:   . Worried About Programme researcher, broadcasting/film/video in the Last Year:   . Barista in the Last Year:   Transportation Needs:   . Freight forwarder (Medical):   Marland Kitchen Lack of Transportation (Non-Medical):   Physical Activity:   . Days of Exercise per Week:   . Minutes of Exercise per Session:   Stress:   . Feeling of Stress :   Social Connections:   . Frequency of Communication with Friends and Family:     . Frequency of Social Gatherings with Friends and Family:   . Attends Religious Services:   . Active Member of Clubs or Organizations:   . Attends Banker Meetings:   Marland Kitchen Marital Status:      Family History: The patient's ***family history includes Arthritis  in her father; Cancer - Lung in an other family member; Cancer - Other in an other family member; Cancer - Prostate in an other family member; Colon cancer in her brother and paternal uncle; Diabetes in her mother and sister; Heart disease in her sister; Heart failure in her father; Hypertension in her mother and sister; Neuropathy in her sister; Transient ischemic attack in her sister.  ROS:   All other ROS reviewed and negative. Pertinent positives noted in the HPI.     EKGs/Labs/Other Studies Reviewed:   The following studies were personally reviewed by me today:  EKG:  EKG is *** ordered today.  The ekg ordered today demonstrates ***, and was personally reviewed by me.   TTE 11/18/2018 1. Left ventricular ejection fraction, by visual estimation, is 60 to 65%. The left ventricle has normal function. There is no left ventricular hypertrophy. 2. Left ventricular diastolic Doppler parameters are consistent with impaired relaxation pattern of LV diastolic filling. 3. Global right ventricle has normal systolic function.The right ventricular size is normal. No increase in right ventricular wall thickness. 4. Left atrial size was moderately dilated. 5. Right atrial size was normal. 6. The mitral valve is abnormal. Mild mitral valve regurgitation. 7. The tricuspid valve is grossly normal. Tricuspid valve regurgitation is trivial. 8. The aortic valve is tricuspid Aortic valve regurgitation was not visualized by color flow Doppler. 9. The pulmonic valve was grossly normal. Pulmonic valve regurgitation is trivial by color flow Doppler. 10. Normal pulmonary artery systolic pressure. 11. The inferior vena cava is normal in  size with greater than 50% respiratory variability, suggesting right atrial pressure of 3 mmHg.  NM Stress 08/29/2019  The left ventricular ejection fraction is normal (55-65%).  Nuclear stress EF: 63%.  There was no ST segment deviation noted during stress.  The study is normal.  This is a low risk study.  No prior study for comparison.  Recent Labs: 05/17/2019: ALT 16; Hemoglobin 13.2; Platelets 191 05/18/2019: Magnesium 1.6 09/11/2019: BUN 12; Creatinine, Ser 0.94; Potassium 4.4; Sodium 140   Recent Lipid Panel    Component Value Date/Time   CHOL 272 (H) 09/11/2017 1314   TRIG 116 09/11/2017 1314   HDL 40 (L) 09/11/2017 1314   CHOLHDL 6.8 (H) 09/11/2017 1314   VLDL 49 (H) 04/06/2015 0714   LDLCALC 207 (H) 09/11/2017 1314    Physical Exam:   VS:  There were no vitals taken for this visit.   Wt Readings from Last 3 Encounters:  11/19/19 135 lb (61.2 kg)  09/03/19 135 lb (61.2 kg)  08/29/19 136 lb (61.7 kg)    General: Well nourished, well developed, in no acute distress Heart: Atraumatic, normal size  Eyes: PEERLA, EOMI  Neck: Supple, no JVD Endocrine: No thryomegaly Cardiac: Normal S1, S2; RRR; no murmurs, rubs, or gallops Lungs: Clear to auscultation bilaterally, no wheezing, rhonchi or rales  Abd: Soft, nontender, no hepatomegaly  Ext: No edema, pulses 2+ Musculoskeletal: No deformities, BUE and BLE strength normal and equal Skin: Warm and dry, no rashes   Neuro: Alert and oriented to person, place, time, and situation, CNII-XII grossly intact, no focal deficits  Psych: Normal mood and affect   ASSESSMENT:   [name] is a 59 y.o. female who presents for the following: No diagnosis found.  PLAN:   There are no diagnoses linked to this encounter.  Disposition: No follow-ups on file.  Medication Adjustments/Labs and Tests Ordered: Current medicines are reviewed at length with the patient  today.  Concerns regarding medicines are outlined above.  No  orders of the defined types were placed in this encounter.  No orders of the defined types were placed in this encounter.   There are no Patient Instructions on file for this visit.   Time Spent with Patient: I have spent a total of *** minutes with patient reviewing hospital notes, telemetry, EKGs, labs and examining the patient as well as establishing an assessment and plan that was discussed with the patient.  > 50% of time was spent in direct patient care.  Signed, Lenna Gilford. Flora Lipps, MD [facility]  [address], [address] [place], [place] [address] [phone]  02/12/2020 6:32 AM

## 2020-02-25 MED FILL — PROMETHAZINE 25 MG TABLET: 25 | 30 days supply | Qty: 90 | Fill #0

## 2020-02-26 MED FILL — BUPRENORPHINE HCL-NALOXONE: 8-2 | 30 days supply | Qty: 60 | Fill #0

## 2020-02-26 MED FILL — BUPRENORPHINE HCL-NALOXONE: 8-2 | 8 days supply | Qty: 24 | Fill #1

## 2020-03-24 MED FILL — PROMETHAZINE 25 MG TABLET: 25 | 30 days supply | Qty: 90 | Fill #0

## 2020-03-24 MED FILL — BUPRENORPHINE HCL-NALOXONE: 8-2 | 28 days supply | Qty: 84 | Fill #0

## 2020-04-21 MED FILL — PROMETHAZINE 25 MG TABLET: 25 | 30 days supply | Qty: 90 | Fill #0

## 2020-04-21 MED FILL — BUPRENORPHINE HCL-NALOXONE: 8-2 | 28 days supply | Qty: 84 | Fill #0

## 2020-05-19 MED FILL — BUPRENORPHINE HCL-NALOXONE: 8-2 | 28 days supply | Qty: 84 | Fill #0

## 2020-05-19 MED FILL — PROMETHAZINE 25 MG TABLET: 25 | 30 days supply | Qty: 90 | Fill #0

## 2020-06-16 MED FILL — PROMETHAZINE 25 MG TABLET: 25 | 30 days supply | Qty: 90 | Fill #0

## 2020-06-16 MED FILL — BUPRENORPHINE HCL-NALOXONE: 8-2 | 28 days supply | Qty: 84 | Fill #0

## 2020-07-10 ENCOUNTER — Emergency Department (HOSPITAL_COMMUNITY): Payer: 59

## 2020-07-10 ENCOUNTER — Encounter (HOSPITAL_COMMUNITY): Payer: Self-pay

## 2020-07-10 ENCOUNTER — Other Ambulatory Visit: Payer: Self-pay

## 2020-07-10 ENCOUNTER — Ambulatory Visit
Admission: EM | Admit: 2020-07-10 | Discharge: 2020-07-10 | Disposition: A | Discharge status: Home / Self Care | Payer: 59

## 2020-07-10 ENCOUNTER — Emergency Department (HOSPITAL_COMMUNITY)
Admission: EM | Admit: 2020-07-10 | Discharge: 2020-07-10 | Disposition: A | Discharge status: Home / Self Care | Payer: 59 | Attending: Emergency Medicine | Admitting: Emergency Medicine

## 2020-07-10 DIAGNOSIS — W19XXXA Unspecified fall, initial encounter: Secondary | ICD-10-CM | POA: Insufficient documentation

## 2020-07-10 DIAGNOSIS — Y929 Unspecified place or not applicable: Secondary | ICD-10-CM | POA: Insufficient documentation

## 2020-07-10 DIAGNOSIS — Z5321 Procedure and treatment not carried out due to patient leaving prior to being seen by health care provider: Secondary | ICD-10-CM | POA: Insufficient documentation

## 2020-07-10 DIAGNOSIS — R519 Headache, unspecified: Secondary | ICD-10-CM | POA: Diagnosis not present

## 2020-07-10 DIAGNOSIS — Y939 Activity, unspecified: Secondary | ICD-10-CM | POA: Insufficient documentation

## 2020-07-10 DIAGNOSIS — Y999 Unspecified external cause status: Secondary | ICD-10-CM | POA: Diagnosis not present

## 2020-07-10 DIAGNOSIS — M79601 Pain in right arm: Secondary | ICD-10-CM | POA: Insufficient documentation

## 2020-07-10 NOTE — ED Triage Notes (Signed)
Pt states she had a fall 2 nights ago where she states she thinks she blacked out and fell, hitting her right arm and also states that she hit her head, pt states she has had some nausea

## 2020-07-10 NOTE — ED Triage Notes (Signed)
Patient is being discharged from the Urgent Care and sent to the Emergency Department via private vehicle  . Per B Wurst , patient is in need of higher level of care due to potential head injury . Patient is aware and verbalizes understanding of plan of care.  Vitals:   07/10/20 1647  BP: 112/61  Pulse: 86  Resp: 16  Temp: 98.6 F (37 C)  SpO2: 94%

## 2020-07-10 NOTE — ED Triage Notes (Signed)
Pt to er, pt states that a couple of days ago she got up in the middle of the night she fell and landed on her R arm and her head. States that she is here for a headache and R arm pain.  Pt states that she went to urgent care and they told her she needed a ct scan because she hit her head.  Denies confusion or nausea.

## 2020-07-14 MED FILL — PROMETHAZINE 25 MG TABLET: 25 | 30 days supply | Qty: 90 | Fill #0

## 2020-07-14 MED FILL — BUPRENORPHINE HCL-NALOXONE: 8-2 | 28 days supply | Qty: 84 | Fill #0

## 2020-08-11 ENCOUNTER — Other Ambulatory Visit (HOSPITAL_COMMUNITY): Payer: Self-pay | Admitting: Anesthesiology

## 2020-08-11 MED FILL — BUPRENORPHINE HCL-NALOXONE: 8-2 | 28 days supply | Qty: 84 | Fill #0

## 2020-08-11 MED FILL — PROMETHAZINE 25 MG TABLET: 25 | 30 days supply | Qty: 90 | Fill #0

## 2020-08-16 ENCOUNTER — Emergency Department (HOSPITAL_COMMUNITY)
Admission: EM | Admit: 2020-08-16 | Discharge: 2020-08-16 | Disposition: A | Discharge status: Home / Self Care | Payer: 59 | Attending: Emergency Medicine | Admitting: Emergency Medicine

## 2020-08-16 ENCOUNTER — Other Ambulatory Visit: Payer: Self-pay

## 2020-08-16 ENCOUNTER — Emergency Department (HOSPITAL_COMMUNITY): Payer: 59

## 2020-08-16 ENCOUNTER — Encounter (HOSPITAL_COMMUNITY): Payer: Self-pay | Admitting: Emergency Medicine

## 2020-08-16 DIAGNOSIS — I110 Hypertensive heart disease with heart failure: Secondary | ICD-10-CM | POA: Insufficient documentation

## 2020-08-16 DIAGNOSIS — E039 Hypothyroidism, unspecified: Secondary | ICD-10-CM | POA: Diagnosis not present

## 2020-08-16 DIAGNOSIS — W06XXXA Fall from bed, initial encounter: Secondary | ICD-10-CM | POA: Diagnosis not present

## 2020-08-16 DIAGNOSIS — Z79899 Other long term (current) drug therapy: Secondary | ICD-10-CM | POA: Insufficient documentation

## 2020-08-16 DIAGNOSIS — F17210 Nicotine dependence, cigarettes, uncomplicated: Secondary | ICD-10-CM | POA: Insufficient documentation

## 2020-08-16 DIAGNOSIS — J449 Chronic obstructive pulmonary disease, unspecified: Secondary | ICD-10-CM | POA: Insufficient documentation

## 2020-08-16 DIAGNOSIS — R0781 Pleurodynia: Secondary | ICD-10-CM | POA: Insufficient documentation

## 2020-08-16 DIAGNOSIS — E119 Type 2 diabetes mellitus without complications: Secondary | ICD-10-CM | POA: Insufficient documentation

## 2020-08-16 DIAGNOSIS — M79601 Pain in right arm: Secondary | ICD-10-CM | POA: Diagnosis not present

## 2020-08-16 DIAGNOSIS — I509 Heart failure, unspecified: Secondary | ICD-10-CM | POA: Diagnosis not present

## 2020-08-16 MED ORDER — HYDROCODONE-ACETAMINOPHEN 5-325 MG PO TABS
1.0000 | ORAL_TABLET | Freq: Once | ORAL | Status: AC
  Administered 2020-08-16: 1 via ORAL
  Filled 2020-08-16: qty 1

## 2020-08-16 NOTE — ED Triage Notes (Signed)
Pt fell off the bed on Friday. C/o of right shoulder pain, right rib pain.

## 2020-08-16 NOTE — Discharge Instructions (Addendum)
I recommend a combination of tylenol and ibuprofen for management of your pain. You can take a low dose of both at the same time. I recommend 325 mg of Tylenol combined with 400 mg of ibuprofen. This is one regular Tylenol and two regular ibuprofen. You can take these 2-3 times for day for your pain. Please try to take these medications with a small amount of food as well to prevent upsetting your stomach.  Also, please consider topical pain relieving creams such as Voltaran Gel, BioFreeze, or Icy Hot. There is also a pain relieving cream made by [employer]. You should be able to find all of these at your local pharmacy.   You can wear your sling for the next few days but please be sure to keep moving your right shoulder and arm as your pain tolerates.  Please follow-up with your primary care provider next week if your symptoms are not improved.  Please return to the emergency department if you develop any new or worsening symptoms.  It was a pleasure to meet you.

## 2020-08-16 NOTE — ED Provider Notes (Signed)
[facility] EMERGENCY DEPARTMENT Provider Note   [name]: [id number] Arrival date & time: 08/16/20  1404     History Chief Complaint  Patient presents with  . Fall    [name] is a 59 y.o. female.  HPI Patient is a 59 year old female with a medical history as noted below.  Patient states she was sleeping in bed 2 nights ago and fell off her bed striking her right arm, right ribs, and forehead.  She reports moderate pain in the prior mentioned regions.  States that she was evaluated by her PCP for this and they recommended that she come to the emergency department for imaging.  She reports associated weakness in the right arm secondary to pain.  She denies numbness, tingling, LOC.  She is not on blood thinners.    Past Medical History:  Diagnosis Date  . Anxiety   . Asthma    Albuterol prn   . CHF (congestive heart failure) (HCC)    takes Furosemide daily  . Chronic back pain    DDD/scoliosis  . Complication of anesthesia    hard to wake up  . COPD (chronic obstructive pulmonary disease) (HCC)   . DDD (degenerative disc disease)   . Depression    takes Celexa,Clonazepam daily  . Diabetes mellitus    borderline  . Diverticulitis   . Edema of lower extremity   . Emphysema lung (HCC)   . Fibromyalgia   . Gastritis   . GERD (gastroesophageal reflux disease)    takes Pepcid daily  . Gout   . Gout of big toe    RIGHT  . Hemorrhoids   . Hiatal hernia   . History of bronchitis    last time 4-6months ago   . History of colon polyps   . History of kidney stones    is in her left kidney and has been for couple of yrs and no problems  . HTN (hypertension)    takes lisinopril daily  . Hyperlipidemia    but doesn't take any meds  . Hypothyroidism    takes Synthroid daily  . Insomnia    doesn't take any meds for this  . Joint pain   . Joint swelling   . Migraine    last 04/04/18  . Migraine   . MS (multiple sclerosis) (HCC)    early stages  . Muscle spasm     takes Flexeril daily as needed  . Neuropathy   . Osteoporosis   . Phlebitis   . Pneumonia    hx of;last time in 2014  . PONV (postoperative nausea and vomiting)   . Shortness of breath    with exertion or stressed   . Sleep apnea    sleep study done at least 39yrs ago;has CPAP but doesn't use  . Stroke [facility])    left sided weakness  . TIA (transient ischemic attack)    Hx: of  . Urinary frequency     Patient Active Problem List   Diagnosis Date Noted  . Baclofen overdose 05/17/2019  . Hypothyroidism   . Grade I diastolic dysfunction   . AKI (acute kidney injury) (HCC)   . Anxiety and depression   . Abdominal pain, chronic, epigastric 02/05/2018  . Nausea with vomiting 02/05/2018  . Fibromyalgia 11/02/2017  . Feeling of chest tightness 04/12/2015  . SOB (shortness of breath) 04/12/2015  . Hyperglycemia 04/08/2015  . Tobacco use disorder 04/08/2015  . Headache   . Left-sided weakness   .  Hyperlipidemia 04/06/2015  . Migraine 04/05/2015  . Acute CVA (cerebrovascular accident) (HCC) 04/05/2015  . Acute blood loss anemia   . Hematochezia 11/15/2014  . Bilateral lower abdominal cramping 11/15/2014  . Rectal bleeding 11/15/2014  . Colitis, acute 11/15/2014  . Malnutrition of moderate degree (HCC) 04/17/2014  . Postoperative anemia due to acute blood loss 04/17/2014    Class: Acute  . Malnourished (HCC) 04/17/2014    Class: Present on Admission  . 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 Spondylolisthesis at L4-L5 level 04/14/2014  . Spondylosis of cervical spine 12/30/2013  . Cervical spondylosis without myelopathy 12/30/2013    Class: Chronic  . Spondylolisthesis of lumbar region 12/30/2013    Class: Chronic  . CAP (community acquired pneumonia) 08/24/2013  . GERD (gastroesophageal reflux disease) 08/24/2013  . COPD (chronic obstructive pulmonary disease) (HCC) 08/24/2013  . Chronic pain syndrome 08/24/2013  .  Cigarette smoker 08/24/2013  . Chest pain 08/24/2013  . COPD exacerbation (HCC) 08/11/2012  . DM (diabetes mellitus) (HCC) 08/11/2012  . Leucocytosis 08/11/2012  . Hypertension 08/11/2012    Past Surgical History:  Procedure Laterality Date  . ABDOMINAL HYSTERECTOMY    . ANTERIOR CERVICAL DECOMP/DISCECTOMY FUSION N/A 12/30/2013   Procedure: ANTERIOR CERVICAL DISCECTOMY FUSION C4-5, C5-6, plate and screws, allograft, local bone graft;  Surgeon: Kerrin Champagne, MD;  Location: [facility] OR;  Service: Orthopedics;  Laterality: N/A;  . BACK SURGERY     lumbar discectomy  . BIOPSY N/A 01/20/2015   Procedure: COLON BIOPSY;  Surgeon: West Bali, MD;  Location: [facility] ORS;  Service: Endoscopy;  Laterality: N/A;  . BIOPSY  05/22/2018   Procedure: BIOPSY;  Surgeon: West Bali, MD;  Location: [facility] ENDO SUITE;  Service: Endoscopy;;  gastric  . BLADDER SUSPENSION    . CARDIAC CATHETERIZATION    . CARPAL TUNNEL RELEASE Right   . CHOLECYSTECTOMY    . COLONOSCOPY    . COLONOSCOPY WITH PROPOFOL N/A 01/20/2015   SLF: 1. Normal ileum 2. Redundant left colon 3. Smalll internal hemorrhoids.   . ESOPHAGOGASTRODUODENOSCOPY  10/29/07   normal  . ESOPHAGOGASTRODUODENOSCOPY (EGD) WITH PROPOFOL N/A 05/22/2018   Procedure: ESOPHAGOGASTRODUODENOSCOPY (EGD) WITH PROPOFOL;  Surgeon: West Bali, MD;  Location: [facility] ENDO SUITE;  Service: Endoscopy;  Laterality: N/A;  9:30am  . fundic gland polyp     benign  . POLYPECTOMY N/A 01/20/2015   Procedure: RECTAL POLYPECTOMY;  Surgeon: West Bali, MD;  Location: [facility] ORS;  Service: Endoscopy;  Laterality: N/A;  . SIGMOIDOSCOPY  01/31/08   large internal hemorrhoids/small rectal polyp removed/rare sigmoid diverticula     OB History    Gravida  2   Para  2   Term  0   Preterm  2   AB      Living  2     SAB      TAB      Ectopic      Multiple      Live Births              Family History  Problem Relation Age of Onset  . Diabetes Mother   .  Hypertension Mother   . Arthritis Father   . Heart failure Father   . Diabetes Sister   . Neuropathy Sister   . Transient ischemic attack Sister   . Heart disease Sister   . Hypertension Sister   . Cancer - Lung Other   .  Cancer - Prostate Other   . Cancer - Other Other   . Colon cancer Paternal Uncle   . Colon cancer Brother        deceased age 33 from colon CA and brain CA    Social History   Tobacco Use  . Smoking status: Current Some Day Smoker    Packs/day: 0.75    Years: 15.00    Pack years: 11.25    Types: Cigarettes  . Smokeless tobacco: Never Used  Vaping Use  . Vaping Use: Never used  Substance Use Topics  . Alcohol use: No  . Drug use: No    Home Medications Prior to Admission medications   Medication Sig Start Date End Date Taking? Authorizing Provider  albuterol (VENTOLIN HFA) 108 (90 Base) MCG/ACT inhaler Inhale into the lungs. 07/09/19   [provider]  AMITIZA 24 MCG capsule  08/13/19   [provider]  amLODipine (NORVASC) 10 MG tablet Take 1 tablet (10 mg total) by mouth daily. 09/03/19 12/02/19  O'NealRonnald Ramp, MD  bismuth subsalicylate (PEPTO BISMOL) 262 MG chewable tablet Chew 524 mg by mouth as needed for indigestion or diarrhea or loose stools.    [provider]  Buprenorphine HCl-Naloxone HCl 8-2 MG FILM  08/13/19   [provider]  citalopram (CELEXA) 40 MG tablet Take 40 mg by mouth daily.    [provider]  cyclobenzaprine (FLEXERIL) 10 MG tablet Take 1 tablet (10 mg total) by mouth 3 (three) times daily as needed for muscle spasms. 02/16/18   Aliene Beams, MD  diltiazem (CARDIZEM SR) 120 MG 12 hr capsule Take 1 capsule (120 mg total) by mouth once for 1 dose. Take one hour prior to  Coronary CTA 09/03/19 09/03/19  Sande Rives, MD  esomeprazole (NEXIUM) 20 MG capsule Take 1 capsule (20 mg total) by mouth daily at 12 noon. 09/04/19   O'NealRonnald Ramp, MD  levothyroxine (SYNTHROID)  175 MCG tablet Take 175 mcg by mouth daily before breakfast.    [provider]  lisinopril (ZESTRIL) 40 MG tablet Take 1 tablet (40 mg total) by mouth daily. 09/03/19   O'Neal, Ronnald Ramp, MD  nitroGLYCERIN (NITROSTAT) 0.4 MG SL tablet Place 1 tablet (0.4 mg total) under the tongue every 5 (five) minutes as needed for chest pain. 08/14/19 11/12/19  Sande Rives, MD  promethazine (PHENERGAN) 25 MG tablet  08/13/19   [provider]  rosuvastatin (CRESTOR) 40 MG tablet Take 1 tablet (40 mg total) by mouth daily. 08/14/19   Sande Rives, MD  traZODone (DESYREL) 150 MG tablet Take 150 mg by mouth at bedtime.  12/15/15   [provider]    Allergies    Demerol [meperidine], Imitrex [sumatriptan], Penicillins, Sulfa antibiotics, Abilify [aripiprazole], Levaquin [levofloxacin in d5w], Neurontin [gabapentin], Toradol [ketorolac tromethamine], Tramadol, Ceclor [cefaclor], and Doxycycline  Review of Systems   Review of Systems  Musculoskeletal: Positive for arthralgias and myalgias.  Skin: Negative for color change and wound.  Neurological: Positive for weakness. Negative for numbness.   Physical Exam Updated Vital Signs BP 118/70   Pulse 81   Temp 98.6 F (37 C) (Oral)   Resp 17   Ht 5\' 4"  (1.626 m)   Wt 59.9 kg   SpO2 98%   BMI 22.66 kg/m   Physical Exam Vitals and nursing note reviewed.  Constitutional:      General: She is not in acute distress.    Appearance: Normal appearance. She is  not ill-appearing, toxic-appearing or diaphoretic.  HENT:     Head: Normocephalic and atraumatic.     Right Ear: External ear normal.     Left Ear: External ear normal.     Nose: Nose normal.     Mouth/Throat:     Mouth: Mucous membranes are moist.     Pharynx: Oropharynx is clear. No oropharyngeal exudate or posterior oropharyngeal erythema.  Eyes:     Extraocular Movements: Extraocular movements intact.  Cardiovascular:     Rate and Rhythm: Normal rate  and regular rhythm.     Pulses: Normal pulses.     Heart sounds: Normal heart sounds. No murmur heard.  No friction rub. No gallop.      Comments: Moderate TTP noted along the anterior inferior chest wall.  No crepitus. Pulmonary:     Effort: Pulmonary effort is normal. No respiratory distress.     Breath sounds: Normal breath sounds. No stridor. No wheezing, rhonchi or rales.  Abdominal:     General: Abdomen is flat.     Palpations: Abdomen is soft.     Tenderness: There is no abdominal tenderness.  Musculoskeletal:        General: Tenderness present. Normal range of motion.     Cervical back: Normal range of motion and neck supple. No tenderness.     Comments: Moderate TTP noted circumferentially along the proximal right humerus.  Small amount of overlying ecchymosis.  No significant edema.  No palpable pain in the right shoulder.  Unable to assess range of motion secondary to pain.  No tenderness appreciated in the right elbow or wrist.  2+ radial pulses.  Distal sensation intact in all 4 extremities.  Skin:    General: Skin is warm and dry.  Neurological:     General: No focal deficit present.     Mental Status: She is alert and oriented to person, place, and time.     Comments: Patient is oriented to person, place, and time. Patient phonates in clear, complete, and coherent sentences. Negative arm drift.Strength is 5/5 in the left arm as well as bilateral legs.  Unable to assess the right arm secondary to pain.  Distal sensation intact in all four extremities.  Psychiatric:        Mood and Affect: Mood normal.        Behavior: Behavior normal.    ED Results / Procedures / Treatments   Labs (all labs ordered are listed, but only abnormal results are displayed) Labs Reviewed - No data to display  EKG None  Radiology DG Ribs Unilateral W/Chest Right  Result Date: 08/16/2020 CLINICAL DATA:  59 year old female with fall. Right chest wall pain. EXAM: RIGHT RIBS AND CHEST - 3+  VIEW COMPARISON:  Chest radiograph dated 04/26/2018. FINDINGS: The lungs are clear. There is no pleural effusion pneumothorax. The cardiac silhouette is within limits. Age indeterminate, likely subacute or old fracture of the lateral aspect of the right eighth rib. No other acute fracture. IMPRESSION: 1. No acute cardiopulmonary process. 2. Age indeterminate, likely subacute or old fracture of the lateral right eighth rib. Electronically Signed   By: Elgie Collard M.D.   On: 08/16/2020 15:49   DG Humerus Right  Result Date: 08/16/2020 CLINICAL DATA:  59 year old female with fall and trauma to the right upper extremity. EXAM: RIGHT HUMERUS - 2+ VIEW COMPARISON:  Right upper extremity radiograph dated 07/10/2020. FINDINGS: No acute fracture or dislocation. Mild osteopenia. No significant degenerative changes. The soft tissues are unremarkable.  IMPRESSION: Negative. Electronically Signed   By: Elgie Collard M.D.   On: 08/16/2020 15:47    Procedures Procedures (including critical care time)  Medications Ordered in ED Medications  HYDROcodone-acetaminophen (NORCO/VICODIN) 5-325 MG per tablet 1 tablet (has no administration in time range)   ED Course  I have reviewed the triage vital signs and the nursing notes.  Pertinent labs & imaging results that were available during my care of the patient were reviewed by me and considered in my medical decision making (see chart for details).    MDM Rules/Calculators/A&P                          Pt is a 59 y.o. female that presents with a history, physical exam, and ED Clinical Course as noted above.   Patient presents today due to a fall that occurred 2 days ago.  X-rays of the chest, ribs, right humerus, were all reassuring.  Physical exam is generally reassuring.  Neurological exam is benign.  Patient does have tenderness in the right humerus as well as right anterior ribs.  Unable to assess range of motion of the right shoulder due to the  patient's pain.  No palpable tenderness appreciated along the right shoulder.  Patient was given a Vicodin as well as a right arm sling.  We discussed range of motion exercises as her pain tolerates.  Recommended continued use of Tylenol and ibuprofen for pain.  We discussed dosing.  We discussed multiple topical analgesics.  RICE method.  Patient is hemodynamically stable and in NAD at the time of d/c. Evaluation does not show pathology that would require ongoing emergent intervention or inpatient treatment. I explained the diagnosis to the patient. Patient is comfortable with above plan and is stable for discharge at this time. All questions were answered prior to disposition. Strict return precautions for returning to the ED were discussed. Encouraged follow up with PCP.    An After Visit Summary was printed and given to the patient.  Patient discharged to home/self care.  Condition at discharge: Stable  Note: Portions of this report may have been transcribed using voice recognition software. Every effort was made to ensure accuracy; however, inadvertent computerized transcription errors may be present.   Final Clinical Impression(s) / ED Diagnoses Final diagnoses:  Right arm pain  Rib pain on right side   Rx / DC Orders ED Discharge Orders    None       Placido Sou, PA-C 08/16/20 1700    Mancel Bale, MD 08/17/20 1011

## 2020-08-24 ENCOUNTER — Other Ambulatory Visit: Payer: Self-pay

## 2020-08-24 ENCOUNTER — Encounter: Payer: Self-pay | Admitting: Emergency Medicine

## 2020-08-24 ENCOUNTER — Ambulatory Visit
Admission: EM | Admit: 2020-08-24 | Discharge: 2020-08-24 | Disposition: A | Discharge status: Home / Self Care | Payer: 59 | Attending: Emergency Medicine | Admitting: Emergency Medicine

## 2020-08-24 DIAGNOSIS — M79601 Pain in right arm: Secondary | ICD-10-CM | POA: Diagnosis not present

## 2020-08-24 DIAGNOSIS — R0781 Pleurodynia: Secondary | ICD-10-CM | POA: Diagnosis not present

## 2020-08-24 MED ORDER — PREDNISONE 10 MG PO TABS: 20.0000 mg | ORAL_TABLET | Freq: Every day | ORAL | 0 refills | Status: DC

## 2020-08-24 NOTE — ED Provider Notes (Signed)
[facility]   [id number] 08/24/20 Arrival Time: 1615   Chief Complaint  Patient presents with  . Arm Injury     SUBJECTIVE: History from: patient.  [name] is a 59 y.o. female who presented to the urgent care with a complaint of fall off bed that occurred 1 week ago.  Was seen at the ER and has a right upper arm and right ribs x-ray completed.  Results were negative for acute fracture or dislocation.  She is reporting symptoms have not improved.  She localizes the pain to the right upper arm and ribs.  She describes the pain as constant and achy.  She has tried OTC medications without relief.  Her  symptoms are made worse with ROM.  She denies similar symptoms in the past.  Denies chills, fever, nausea, vomiting, diarrhea  ROS: As per HPI.  All other pertinent ROS negative.     Past Medical History:  Diagnosis Date  . Anxiety   . Asthma    Albuterol prn   . CHF (congestive heart failure) (HCC)    takes Furosemide daily  . Chronic back pain    DDD/scoliosis  . Complication of anesthesia    hard to wake up  . COPD (chronic obstructive pulmonary disease) (HCC)   . DDD (degenerative disc disease)   . Depression    takes Celexa,Clonazepam daily  . Diabetes mellitus    borderline  . Diverticulitis   . Edema of lower extremity   . Emphysema lung (HCC)   . Fibromyalgia   . Gastritis   . GERD (gastroesophageal reflux disease)    takes Pepcid daily  . Gout   . Gout of big toe    RIGHT  . Hemorrhoids   . Hiatal hernia   . History of bronchitis    last time 4-87months ago   . History of colon polyps   . History of kidney stones    is in her left kidney and has been for couple of yrs and no problems  . HTN (hypertension)    takes lisinopril daily  . Hyperlipidemia    but doesn't take any meds  . Hypothyroidism    takes Synthroid daily  . Insomnia    doesn't take any meds for this  . Joint pain   . Joint swelling   . Migraine    last 04/04/18  .  Migraine   . MS (multiple sclerosis) (HCC)    early stages  . Muscle spasm    takes Flexeril daily as needed  . Neuropathy   . Osteoporosis   . Phlebitis   . Pneumonia    hx of;last time in 2014  . PONV (postoperative nausea and vomiting)   . Shortness of breath    with exertion or stressed   . Sleep apnea    sleep study done at least 70yrs ago;has CPAP but doesn't use  . Stroke [facility])    left sided weakness  . TIA (transient ischemic attack)    Hx: of  . Urinary frequency    Past Surgical History:  Procedure Laterality Date  . ABDOMINAL HYSTERECTOMY    . ANTERIOR CERVICAL DECOMP/DISCECTOMY FUSION N/A 12/30/2013   Procedure: ANTERIOR CERVICAL DISCECTOMY FUSION C4-5, C5-6, plate and screws, allograft, local bone graft;  Surgeon: Kerrin Champagne, MD;  Location: [facility] OR;  Service: Orthopedics;  Laterality: N/A;  . BACK SURGERY     lumbar discectomy  . BIOPSY N/A 01/20/2015   Procedure: COLON BIOPSY;  Surgeon: West Bali, MD;  Location: [facility] ORS;  Service: Endoscopy;  Laterality: N/A;  . BIOPSY  05/22/2018   Procedure: BIOPSY;  Surgeon: West Bali, MD;  Location: [facility];  Service: Endoscopy;;  gastric  . BLADDER SUSPENSION    . CARDIAC CATHETERIZATION    . CARPAL TUNNEL RELEASE Right   . CHOLECYSTECTOMY    . COLONOSCOPY    . COLONOSCOPY WITH PROPOFOL N/A 01/20/2015   SLF: 1. Normal ileum 2. Redundant left colon 3. Smalll internal hemorrhoids.   . ESOPHAGOGASTRODUODENOSCOPY  10/29/07   normal  . ESOPHAGOGASTRODUODENOSCOPY (EGD) WITH PROPOFOL N/A 05/22/2018   Procedure: ESOPHAGOGASTRODUODENOSCOPY (EGD) WITH PROPOFOL;  Surgeon: West Bali, MD;  Location: [facility];  Service: Endoscopy;  Laterality: N/A;  9:30am  . fundic gland polyp     benign  . POLYPECTOMY N/A 01/20/2015   Procedure: RECTAL POLYPECTOMY;  Surgeon: West Bali, MD;  Location: [facility]S;  Service: Endoscopy;  Laterality: N/A;  . SIGMOIDOSCOPY  01/31/08   large internal hemorrhoids/small rectal polyp  removed/rare sigmoid diverticula   Allergies  Allergen Reactions  . Demerol [Meperidine] Anaphylaxis and Swelling  . Imitrex [Sumatriptan] Anaphylaxis  . Penicillins Anaphylaxis and Swelling    Has patient had a PCN reaction causing immediate rash, facial/tongue/throat swelling, SOB or lightheadedness with hypotension: Yes Has patient had a PCN reaction causing severe rash involving mucus membranes or skin necrosis: No Has patient had a PCN reaction that required hospitalization Yes Has patient had a PCN reaction occurring within the last 10 years: No If all of the above answers are "NO", then may proceed with Cephalosporin use.   . Sulfa Antibiotics Anaphylaxis  . Abilify [Aripiprazole] Nausea And Vomiting  . Levaquin [Levofloxacin In D5w]     Abdominal pain  . Neurontin [Gabapentin] Other (See Comments)    Abdominal pain  . Toradol [Ketorolac Tromethamine] Other (See Comments)    "Feels like my whole body is on fire."  . Tramadol Other (See Comments)    Stomach upset   . Ceclor [Cefaclor] Nausea Only  . Doxycycline Nausea And Vomiting        No current facility-administered medications on file prior to encounter.   Current Outpatient Medications on File Prior to Encounter  Medication Sig Dispense Refill  . albuterol (VENTOLIN HFA) 108 (90 Base) MCG/ACT inhaler Inhale into the lungs.    . AMITIZA 24 MCG capsule     . amLODipine (NORVASC) 10 MG tablet Take 1 tablet (10 mg total) by mouth daily. 180 tablet 3  . bismuth subsalicylate (PEPTO BISMOL) 262 MG chewable tablet Chew 524 mg by mouth as needed for indigestion or diarrhea or loose stools.    . Buprenorphine HCl-Naloxone HCl 8-2 MG FILM     . citalopram (CELEXA) 40 MG tablet Take 40 mg by mouth daily.    . cyclobenzaprine (FLEXERIL) 10 MG tablet Take 1 tablet (10 mg total) by mouth 3 (three) times daily as needed for muscle spasms. 60 tablet 0  . diltiazem (CARDIZEM SR) 120 MG 12 hr capsule Take 1 capsule (120 mg total) by  mouth once for 1 dose. Take one hour prior to  Coronary CTA 1 capsule 0  . esomeprazole (NEXIUM) 20 MG capsule Take 1 capsule (20 mg total) by mouth daily at 12 noon. 30 capsule 12  . levothyroxine (SYNTHROID) 175 MCG tablet Take 175 mcg by mouth daily before breakfast.    . lisinopril (ZESTRIL) 40 MG tablet Take 1 tablet (40  mg total) by mouth daily. 90 tablet 3  . nitroGLYCERIN (NITROSTAT) 0.4 MG SL tablet Place 1 tablet (0.4 mg total) under the tongue every 5 (five) minutes as needed for chest pain. 25 tablet 4  . promethazine (PHENERGAN) 25 MG tablet     . rosuvastatin (CRESTOR) 40 MG tablet Take 1 tablet (40 mg total) by mouth daily. 30 tablet 3  . traZODone (DESYREL) 150 MG tablet Take 150 mg by mouth at bedtime.   0   Social History   Socioeconomic History  . Marital status: Married    Spouse name: Not on file  . Number of children: Not on file  . Years of education: Not on file  . Highest education level: Not on file  Occupational History  . Not on file  Tobacco Use  . Smoking status: Current Some Day Smoker    Packs/day: 0.75    Years: 15.00    Pack years: 11.25    Types: Cigarettes  . Smokeless tobacco: Never Used  Vaping Use  . Vaping Use: Never used  Substance and Sexual Activity  . Alcohol use: No  . Drug use: No  . Sexual activity: Yes    Birth control/protection: Surgical  Other Topics Concern  . Not on file  Social History Narrative   Patient is married and has children.  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  Denies any alcohol or other drug use.  Reports that 1 of her children is also dealing with narcotic addiction and also goes to the Suboxone clinic with her.   Eats meat fruits and vegetables.   Currently smokes.   Not interested in quitting smoking at this time.   Wears seatbelt.   Social Determinants of Health   Financial Resource Strain:   . Difficulty of Paying Living Expenses: Not on file  Food Insecurity:   .  Worried About Programme researcher, broadcasting/film/video in the Last Year: Not on file  . Ran Out of Food in the Last Year: Not on file  Transportation Needs:   . Lack of Transportation (Medical): Not on file  . Lack of Transportation (Non-Medical): Not on file  Physical Activity:   . Days of Exercise per Week: Not on file  . Minutes of Exercise per Session: Not on file  Stress:   . Feeling of Stress : Not on file  Social Connections:   . Frequency of Communication with Friends and Family: Not on file  . Frequency of Social Gatherings with Friends and Family: Not on file  . Attends Religious Services: Not on file  . Active Member of Clubs or Organizations: Not on file  . Attends Banker Meetings: Not on file  . Marital Status: Not on file  Intimate Partner Violence:   . Fear of Current or Ex-Partner: Not on file  . Emotionally Abused: Not on file  . Physically Abused: Not on file  . Sexually Abused: Not on file   Family History  Problem Relation Age of Onset  . Diabetes Mother   . Hypertension Mother   . Arthritis Father   . Heart failure Father   . Diabetes Sister   . Neuropathy Sister   . Transient ischemic attack Sister   . Heart disease Sister   . Hypertension Sister   . Cancer - Lung Other   . Cancer - Prostate Other   . Cancer - Other Other   . Colon cancer Paternal Uncle   .  Colon cancer Brother        deceased age 52 from colon CA and brain CA    OBJECTIVE:  Vitals:   08/24/20 1702  BP: 135/75  Pulse: 90  Resp: 16  Temp: 97.8 F (36.6 C)  SpO2: 97%     Physical Exam Vitals and nursing note reviewed.  Constitutional:      General: She is not in acute distress.    Appearance: Normal appearance. She is normal weight. She is not ill-appearing, toxic-appearing or diaphoretic.  HENT:     Head: Normocephalic.  Cardiovascular:     Rate and Rhythm: Normal rate and regular rhythm.     Pulses: Normal pulses.     Heart sounds: Normal heart sounds. No murmur heard.    No friction rub. No gallop.   Pulmonary:     Effort: Pulmonary effort is normal. No respiratory distress.     Breath sounds: Normal breath sounds. No stridor. No wheezing, rhonchi or rales.  Chest:     Chest wall: No tenderness.  Musculoskeletal:        General: Tenderness present.     Right upper arm: Tenderness present.     Left upper arm: Normal.     Comments: The right arm is without any obvious asymmetry or deformity when compared to the left arm.  No ecchymosis, open wound, warmth, swelling, or lesion present.  Limited range of motion due to pain.  Neurovascular status intact.  Neurological:     Mental Status: She is alert and oriented to person, place, and time.      LABS:  No results found for this or any previous visit (from the past 24 hour(s)).   ASSESSMENT & PLAN:  1. Right arm pain   2. Rib pain on right side     Meds ordered this encounter  Medications  . predniSONE (DELTASONE) 10 MG tablet    Sig: Take 2 tablets (20 mg total) by mouth daily.    Dispense:  15 tablet    Refill:  0    Discharge instructions  Continue to take OTC Tylenol 500 mg as needed for pain Prednisone was prescribed/take as directed Follow RICE instruction that is attached Follow-up with PCP Return or go to ED for worsening symptoms  Reviewed expectations re: course of current medical issues. Questions answered. Outlined signs and symptoms indicating need for more acute intervention. Patient verbalized understanding. After Visit Summary given.         Durward Parcel, FNP 08/24/20 1812

## 2020-08-24 NOTE — Discharge Instructions (Addendum)
Continue to take OTC Tylenol 500 mg as needed for pain Prednisone was prescribed/take as directed Follow RICE instruction that is attached Follow-up with PCP Return or go to ED for worsening symptoms

## 2020-08-24 NOTE — ED Triage Notes (Signed)
Pt fell off bed over a week ago.  Was seen at ED after the fall and had xrays.  Pt c/o pain to RT upper arm and RT ribs with movement

## 2020-09-08 ENCOUNTER — Other Ambulatory Visit (HOSPITAL_COMMUNITY): Payer: Self-pay | Admitting: Anesthesiology

## 2020-09-08 MED FILL — PROMETHAZINE 25 MG TABLET: 25 | 30 days supply | Qty: 90 | Fill #0

## 2020-09-08 MED FILL — BUPRENORPHINE HCL-NALOXONE: 8-2 | 28 days supply | Qty: 84 | Fill #0

## 2020-10-04 NOTE — Progress Notes (Deleted)
Cardiology Office Note:   Date:  10/04/2020  NAME:  [name]    MRN: [id number] DOB:  July 15, 1961   PCP:  Malka So., MD  Cardiologist:  No primary care provider on file.  Electrophysiologist:  None   Referring MD: Malka So., MD   No chief complaint on file. ***  History of Present Illness:   [name] is a 59 y.o. female with a hx of precordial CP, HTN, HLD who presents for follow-up. Needs repeat lipid profile. Seen in 2020 for atypical CP and tests were normal.   Problem List 1. COPD 2. Diabetes 3. Anxiety  4. HTN 5. Chest pain -normal echo -normal MPI 6. HLD -T chol 272, HDL 40, LDL 207, TG 116  Past Medical History: Past Medical History:  Diagnosis Date  . Anxiety   . Asthma    Albuterol prn   . CHF (congestive heart failure) (HCC)    takes Furosemide daily  . Chronic back pain    DDD/scoliosis  . Complication of anesthesia    hard to wake up  . COPD (chronic obstructive pulmonary disease) (HCC)   . DDD (degenerative disc disease)   . Depression    takes Celexa,Clonazepam daily  . Diabetes mellitus    borderline  . Diverticulitis   . Edema of lower extremity   . Emphysema lung (HCC)   . Fibromyalgia   . Gastritis   . GERD (gastroesophageal reflux disease)    takes Pepcid daily  . Gout   . Gout of big toe    RIGHT  . Hemorrhoids   . Hiatal hernia   . History of bronchitis    last time 4-6months ago   . History of colon polyps   . History of kidney stones    is in her left kidney and has been for couple of yrs and no problems  . HTN (hypertension)    takes lisinopril daily  . Hyperlipidemia    but doesn't take any meds  . Hypothyroidism    takes Synthroid daily  . Insomnia    doesn't take any meds for this  . Joint pain   . Joint swelling   . Migraine    last 04/04/18  . Migraine   . MS (multiple sclerosis) (HCC)    early stages  . Muscle spasm    takes Flexeril daily as needed  . Neuropathy   . Osteoporosis   .  Phlebitis   . Pneumonia    hx of;last time in 2014  . PONV (postoperative nausea and vomiting)   . Shortness of breath    with exertion or stressed   . Sleep apnea    sleep study done at least 31yrs ago;has CPAP but doesn't use  . Stroke [facility])    left sided weakness  . TIA (transient ischemic attack)    Hx: of  . Urinary frequency     Past Surgical History: Past Surgical History:  Procedure Laterality Date  . ABDOMINAL HYSTERECTOMY    . ANTERIOR CERVICAL DECOMP/DISCECTOMY FUSION N/A 12/30/2013   Procedure: ANTERIOR CERVICAL DISCECTOMY FUSION C4-5, C5-6, plate and screws, allograft, local bone graft;  Surgeon: Kerrin Champagne, MD;  Location: [facility] OR;  Service: Orthopedics;  Laterality: N/A;  . BACK SURGERY     lumbar discectomy  . BIOPSY N/A 01/20/2015   Procedure: COLON BIOPSY;  Surgeon: West Bali, MD;  Location: [facility];  Service: Endoscopy;  Laterality: N/A;  . BIOPSY  05/22/2018   Procedure: BIOPSY;  Surgeon: West Bali, MD;  [facility]: [facility];  Service: Endoscopy;;  gastric  . BLADDER SUSPENSION    . CARDIAC CATHETERIZATION    . CARPAL TUNNEL RELEASE Right   . CHOLECYSTECTOMY    . COLONOSCOPY    . COLONOSCOPY WITH PROPOFOL N/A 01/20/2015   SLF: 1. Normal ileum 2. Redundant left colon 3. Smalll internal hemorrhoids.   . ESOPHAGOGASTRODUODENOSCOPY  10/29/07   normal  . ESOPHAGOGASTRODUODENOSCOPY (EGD) WITH PROPOFOL N/A 05/22/2018   Procedure: ESOPHAGOGASTRODUODENOSCOPY (EGD) WITH PROPOFOL;  Surgeon: West Bali, MD;  [facility]: [facility];  Service: Endoscopy;  Laterality: N/A;  9:30am  . fundic gland polyp     benign  . POLYPECTOMY N/A 01/20/2015   Procedure: RECTAL POLYPECTOMY;  Surgeon: West Bali, MD;  Location: [facility];  Service: Endoscopy;  Laterality: N/A;  . SIGMOIDOSCOPY  01/31/08   large internal hemorrhoids/small rectal polyp removed/rare sigmoid diverticula    Current Medications: No outpatient medications have been marked as taking for the  10/06/20 encounter (Appointment) with Sande Rives, MD.     Allergies:    Demerol [meperidine], Imitrex [sumatriptan], Penicillins, Sulfa antibiotics, Abilify [aripiprazole], Levaquin [levofloxacin in d5w], Neurontin [gabapentin], Toradol [ketorolac tromethamine], Tramadol, Ceclor [cefaclor], and Doxycycline   Social History: Social History   Socioeconomic History  . Marital status: Married    Spouse name: Not on file  . Number of children: Not on file  . Years of education: Not on file  . Highest education level: Not on file  Occupational History  . Not on file  Tobacco Use  . Smoking status: Current Some Day Smoker    Packs/day: 0.75    Years: 15.00    Pack years: 11.25    Types: Cigarettes  . Smokeless tobacco: Never Used  Vaping Use  . Vaping Use: Never used  Substance and Sexual Activity  . Alcohol use: No  . Drug use: No  . Sexual activity: Yes    Birth control/protection: Surgical  Other Topics Concern  . Not on file  Social History Narrative   Patient is married and has children.  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  Denies any alcohol or other drug use.  Reports that 1 of her children is also dealing with narcotic addiction and also goes to the Suboxone clinic with her.   Eats meat fruits and vegetables.   Currently smokes.   Not interested in quitting smoking at this time.   Wears seatbelt.   Social Determinants of Health   Financial Resource Strain:   . Difficulty of Paying Living Expenses: Not on file  Food Insecurity:   . Worried About Programme researcher, broadcasting/film/video in the Last Year: Not on file  . Ran Out of Food in the Last Year: Not on file  Transportation Needs:   . Lack of Transportation (Medical): Not on file  . Lack of Transportation (Non-Medical): Not on file  Physical Activity:   . Days of Exercise per Week: Not on file  . Minutes of Exercise per Session: Not on file  Stress:   . Feeling of Stress : Not on  file  Social Connections:   . Frequency of Communication with Friends and Family: Not on file  . Frequency of Social Gatherings with Friends and Family: Not on file  . Attends Religious Services: Not on file  . Active Member of Clubs or Organizations: Not on file  .  Attends Banker Meetings: Not on file  . Marital Status: Not on file     Family History: The patient's ***family history includes Arthritis in her father; Cancer - Lung in an other family member; Cancer - Other in an other family member; Cancer - Prostate in an other family member; Colon cancer in her brother and paternal uncle; Diabetes in her mother and sister; Heart disease in her sister; Heart failure in her father; Hypertension in her mother and sister; Neuropathy in her sister; Transient ischemic attack in her sister.  ROS:   All other ROS reviewed and negative. Pertinent positives noted in the HPI.     EKGs/Labs/Other Studies Reviewed:   The following studies were personally reviewed by me today:  EKG:  EKG is *** ordered today.  The ekg ordered today demonstrates ***, and was personally reviewed by me.   Zio 10/2019 1. No sustained arrhythmias to explain symptoms.  2. No atrial fibrillation.  3. Rare ectopy.   NM Stress 08/29/2019  The left ventricular ejection fraction is normal (55-65%).  Nuclear stress EF: 63%.  There was no ST segment deviation noted during stress.  The study is normal.  This is a low risk study.  No prior study for comparison.   TTE 08/21/2019  1. Left ventricular ejection fraction, by visual estimation, is 60 to  65%. The left ventricle has normal function. There is no left ventricular  hypertrophy.  2. Left ventricular diastolic Doppler parameters are consistent with  impaired relaxation pattern of LV diastolic filling.  3. Global right ventricle has normal systolic function.The right  ventricular size is normal. No increase in right ventricular wall   thickness.  4. Left atrial size was moderately dilated.  5. Right atrial size was normal.  6. The mitral valve is abnormal. Mild mitral valve regurgitation.  7. The tricuspid valve is grossly normal. Tricuspid valve regurgitation  is trivial.  8. The aortic valve is tricuspid Aortic valve regurgitation was not  visualized by color flow Doppler.  9. The pulmonic valve was grossly normal. Pulmonic valve regurgitation is  trivial by color flow Doppler.  10. Normal pulmonary artery systolic pressure.  11. The inferior vena cava is normal in size with greater than 50%  respiratory variability, suggesting right atrial pressure of 3 mmHg.     Recent Labs: No results found for requested labs within last 8760 hours.   Recent Lipid Panel    Component Value Date/Time   CHOL 272 (H) 09/11/2017 1314   TRIG 116 09/11/2017 1314   HDL 40 (L) 09/11/2017 1314   CHOLHDL 6.8 (H) 09/11/2017 1314   VLDL 49 (H) 04/06/2015 0714   LDLCALC 207 (H) 09/11/2017 1314    Physical Exam:   VS:  There were no vitals taken for this visit.   Wt Readings from Last 3 Encounters:  08/16/20 132 lb (59.9 kg)  11/19/19 135 lb (61.2 kg)  09/03/19 135 lb (61.2 kg)    General: Well nourished, well developed, in no acute distress Heart: Atraumatic, normal size  Eyes: PEERLA, EOMI  Neck: Supple, no JVD Endocrine: No thryomegaly Cardiac: Normal S1, S2; RRR; no murmurs, rubs, or gallops Lungs: Clear to auscultation bilaterally, no wheezing, rhonchi or rales  Abd: Soft, nontender, no hepatomegaly  Ext: No edema, pulses 2+ Musculoskeletal: No deformities, BUE and BLE strength normal and equal Skin: Warm and dry, no rashes   Neuro: Alert and oriented to person, place, time, and situation, CNII-XII grossly intact, no focal  deficits  Psych: Normal mood and affect   ASSESSMENT:   TAWONA FILSINGER is a 59 y.o. female who presents for the following: No diagnosis found.  PLAN:   There are no diagnoses linked to  this encounter.  Disposition: No follow-ups on file.  Medication Adjustments/Labs and Tests Ordered: Current medicines are reviewed at length with the patient today.  Concerns regarding medicines are outlined above.  No orders of the defined types were placed in this encounter.  No orders of the defined types were placed in this encounter.   There are no Patient Instructions on file for this visit.   Time Spent with Patient: I have spent a total of *** minutes with patient reviewing hospital notes, telemetry, EKGs, labs and examining the patient as well as establishing an assessment and plan that was discussed with the patient.  > 50% of time was spent in direct patient care.  Signed, Lenna Gilford. Flora Lipps, MD [facility]  [address] [place], [place] [address] [phone]  10/04/2020 7:33 PM

## 2020-10-06 ENCOUNTER — Other Ambulatory Visit (HOSPITAL_COMMUNITY): Payer: Self-pay | Admitting: Anesthesiology

## 2020-10-06 ENCOUNTER — Ambulatory Visit: Payer: 59 | Admitting: Cardiovascular Disease

## 2020-10-06 MED FILL — BUPRENORPHINE HCL-NALOXONE: 8-2 | 28 days supply | Qty: 84 | Fill #0

## 2020-10-06 MED FILL — PROMETHAZINE 25 MG TABLET: 25 | 30 days supply | Qty: 90 | Fill #0

## 2020-11-02 ENCOUNTER — Other Ambulatory Visit (HOSPITAL_COMMUNITY): Payer: Self-pay | Admitting: Anesthesiology

## 2020-11-02 MED FILL — BUPRENORPHINE HCL-NALOXONE: 8-2 | 28 days supply | Qty: 84 | Fill #0

## 2020-11-02 MED FILL — PROMETHAZINE 25 MG TABLET: 25 | 30 days supply | Qty: 90 | Fill #0

## 2020-11-30 ENCOUNTER — Other Ambulatory Visit (HOSPITAL_COMMUNITY): Payer: Self-pay | Admitting: Anesthesiology

## 2020-11-30 MED FILL — PROMETHAZINE 25 MG TABLET: 25 | 30 days supply | Qty: 90 | Fill #0

## 2020-11-30 MED FILL — BUPRENORPHINE HCL-NALOXONE: 8-2 | 28 days supply | Qty: 84 | Fill #0

## 2020-12-30 ENCOUNTER — Other Ambulatory Visit (HOSPITAL_COMMUNITY): Payer: Self-pay | Admitting: Anesthesiology

## 2020-12-30 MED FILL — PROMETHAZINE 25 MG TABLET: 25 | 30 days supply | Qty: 90 | Fill #0

## 2020-12-30 MED FILL — BUPRENORPHINE HCL-NALOXONE: 8-2 | 28 days supply | Qty: 84 | Fill #0

## 2021-01-27 ENCOUNTER — Other Ambulatory Visit (HOSPITAL_COMMUNITY): Payer: Self-pay | Admitting: Anesthesiology

## 2021-01-27 MED FILL — PROMETHAZINE 25 MG TABLET: 25 | 30 days supply | Qty: 90 | Fill #0

## 2021-01-27 MED FILL — BUPREN-NALOX FILM 8/2MG: 8-2 | 28 days supply | Qty: 84 | Fill #0

## 2021-02-24 ENCOUNTER — Other Ambulatory Visit (HOSPITAL_COMMUNITY): Payer: Self-pay

## 2021-02-24 MED ORDER — BUPRENORPHINE HCL-NALOXONE HCL 8-2 MG SL FILM
ORAL_FILM | SUBLINGUAL | 0 refills | Status: DC
  Filled 2021-02-24: qty 84, 28d supply, fill #0

## 2021-02-24 MED ORDER — PROMETHAZINE HCL 25 MG PO TABS
ORAL_TABLET | ORAL | 0 refills | Status: DC
  Filled 2021-02-24: qty 90, 30d supply, fill #0

## 2021-02-24 MED ORDER — BUPRENORPHINE HCL-NALOXONE HCL 8-2 MG SL FILM
ORAL_FILM | Freq: Three times a day (TID) | SUBLINGUAL | 0 refills | Status: AC
  Filled 2021-02-24: qty 42, 14d supply, fill #0

## 2021-03-19 ENCOUNTER — Other Ambulatory Visit (HOSPITAL_COMMUNITY): Payer: Self-pay

## 2021-03-19 MED ORDER — CLONAZEPAM 0.5 MG PO TABS
0.5000 mg | ORAL_TABLET | Freq: Two times a day (BID) | ORAL | 0 refills | Status: DC | PRN
Start: 2021-03-19 — End: 2024-08-06
  Filled 2021-03-19: qty 60, 30d supply, fill #0

## 2021-03-24 ENCOUNTER — Other Ambulatory Visit (HOSPITAL_COMMUNITY): Payer: Self-pay

## 2021-03-24 MED ORDER — BUPRENORPHINE HCL-NALOXONE HCL 8-2 MG SL FILM
ORAL_FILM | SUBLINGUAL | 0 refills | Status: DC
Start: 2021-03-24 — End: 2024-08-09
  Filled 2021-03-24: qty 84, 28d supply, fill #0

## 2021-03-24 MED ORDER — PROMETHAZINE HCL 25 MG PO TABS
ORAL_TABLET | ORAL | 0 refills | Status: DC
  Filled 2021-03-24: qty 90, 30d supply, fill #0

## 2021-04-16 ENCOUNTER — Other Ambulatory Visit (HOSPITAL_COMMUNITY): Payer: Self-pay

## 2021-04-16 MED ORDER — SERTRALINE HCL 50 MG PO TABS
1.0000 | ORAL_TABLET | Freq: Every day | ORAL | 0 refills | Status: DC
  Filled 2021-04-16: qty 30, 30d supply, fill #0

## 2021-04-16 MED ORDER — CLONAZEPAM 0.5 MG PO TABS
ORAL_TABLET | ORAL | 0 refills | Status: DC
  Filled 2021-04-16: qty 60, 30d supply, fill #0

## 2021-04-21 ENCOUNTER — Other Ambulatory Visit (HOSPITAL_COMMUNITY): Payer: Self-pay

## 2021-04-21 MED ORDER — PROMETHAZINE HCL 25 MG PO TABS
ORAL_TABLET | ORAL | 0 refills | Status: DC
Start: 2021-04-21 — End: 2022-12-20
  Filled 2021-04-21: qty 90, 30d supply, fill #0

## 2021-04-21 MED ORDER — BUPRENORPHINE HCL-NALOXONE HCL 8-2 MG SL FILM
ORAL_FILM | SUBLINGUAL | 0 refills | Status: DC
Start: 2021-04-21 — End: 2024-08-06
  Filled 2021-04-21: qty 84, 28d supply, fill #0

## 2021-05-14 ENCOUNTER — Other Ambulatory Visit (HOSPITAL_COMMUNITY): Payer: Self-pay

## 2021-05-14 MED ORDER — SERTRALINE HCL 50 MG PO TABS
50.0000 mg | ORAL_TABLET | Freq: Every day | ORAL | 0 refills | Status: DC
Start: 2021-05-14 — End: 2024-08-06
  Filled 2021-05-14: qty 30, 30d supply, fill #0

## 2021-05-15 ENCOUNTER — Other Ambulatory Visit (HOSPITAL_COMMUNITY): Payer: Self-pay

## 2021-05-15 MED ORDER — CLONAZEPAM 0.5 MG PO TABS
ORAL_TABLET | ORAL | 0 refills | Status: DC
Start: 2021-05-15 — End: 2024-08-06
  Filled 2021-05-15 – 2021-06-05 (×2): qty 28, 14d supply, fill #0

## 2021-05-17 ENCOUNTER — Other Ambulatory Visit (HOSPITAL_COMMUNITY): Payer: Self-pay

## 2021-05-18 ENCOUNTER — Other Ambulatory Visit (HOSPITAL_COMMUNITY): Payer: Self-pay

## 2021-06-05 ENCOUNTER — Other Ambulatory Visit (HOSPITAL_COMMUNITY): Payer: Self-pay

## 2021-07-16 ENCOUNTER — Other Ambulatory Visit (HOSPITAL_COMMUNITY): Payer: Self-pay

## 2021-07-16 MED ORDER — CYCLOBENZAPRINE HCL 10 MG PO TABS
ORAL_TABLET | ORAL | 0 refills | Status: DC
  Filled 2021-07-16: qty 90, 30d supply, fill #0

## 2021-08-17 ENCOUNTER — Other Ambulatory Visit (HOSPITAL_COMMUNITY): Payer: Self-pay | Admitting: Family Medicine

## 2021-08-17 DIAGNOSIS — Z1231 Encounter for screening mammogram for malignant neoplasm of breast: Secondary | ICD-10-CM

## 2021-09-10 ENCOUNTER — Other Ambulatory Visit (HOSPITAL_COMMUNITY): Payer: Self-pay

## 2021-09-10 MED ORDER — BUPRENORPHINE HCL-NALOXONE HCL 8-2 MG SL FILM
ORAL_FILM | SUBLINGUAL | 0 refills | Status: DC
  Filled 2021-09-10 – 2021-09-13 (×2): qty 90, 30d supply, fill #0

## 2021-09-13 ENCOUNTER — Other Ambulatory Visit (HOSPITAL_COMMUNITY): Payer: Self-pay

## 2021-09-14 ENCOUNTER — Other Ambulatory Visit (HOSPITAL_COMMUNITY): Payer: Self-pay

## 2022-03-17 ENCOUNTER — Other Ambulatory Visit (HOSPITAL_COMMUNITY): Payer: Self-pay | Admitting: Family Medicine

## 2022-03-17 ENCOUNTER — Other Ambulatory Visit: Payer: Self-pay | Admitting: Family Medicine

## 2022-03-17 DIAGNOSIS — R1011 Right upper quadrant pain: Secondary | ICD-10-CM

## 2022-08-17 ENCOUNTER — Ambulatory Visit
Admission: EM | Admit: 2022-08-17 | Discharge: 2022-08-17 | Disposition: A | Discharge status: Home / Self Care | Payer: Self-pay | Attending: Family Medicine | Admitting: Family Medicine

## 2022-08-17 DIAGNOSIS — J441 Chronic obstructive pulmonary disease with (acute) exacerbation: Secondary | ICD-10-CM

## 2022-08-17 DIAGNOSIS — J069 Acute upper respiratory infection, unspecified: Secondary | ICD-10-CM

## 2022-08-17 MED ORDER — IPRATROPIUM-ALBUTEROL 0.5-2.5 (3) MG/3ML IN SOLN
3.0000 mL | Freq: Once | RESPIRATORY_TRACT | Status: AC
  Administered 2022-08-17: 3 mL via RESPIRATORY_TRACT

## 2022-08-17 MED ORDER — PREDNISONE 20 MG PO TABS: 40.0000 mg | ORAL_TABLET | Freq: Every day | ORAL | 0 refills | Status: DC

## 2022-08-17 MED ORDER — AZITHROMYCIN 250 MG PO TABS: ORAL_TABLET | ORAL | 0 refills | Status: DC

## 2022-08-17 MED ORDER — PROMETHAZINE-DM 6.25-15 MG/5ML PO SYRP: 5.0000 mL | ORAL_SOLUTION | Freq: Four times a day (QID) | ORAL | 0 refills | Status: DC | PRN

## 2022-08-17 MED ORDER — ALBUTEROL SULFATE HFA 108 (90 BASE) MCG/ACT IN AERS: 2.0000 | INHALATION_SPRAY | RESPIRATORY_TRACT | 0 refills | Status: DC | PRN

## 2022-08-17 NOTE — ED Triage Notes (Signed)
Chest congestion, ear ache, wheezing and coughing. Taken mucinex but provided no relief  Gets worse at night started 4 days ago keeps getting worse as days goes on Uses copd inhalers but no relief

## 2022-08-17 NOTE — ED Provider Notes (Signed)
[facility]    [name]: [id number] Arrival date & time: 08/17/22  1541      History   Chief Complaint No chief complaint on file.   HPI [name] is a 61 y.o. female.   Patient presenting today with 4-day history of ear pressure, cough, congestion, chest tightness, wheezing, shortness of breath.  Denies fever, chills, body aches, abdominal pain, nausea vomiting or diarrhea.  Has been using her inhalers for her COPD with minimal relief.  Feels like it settling into her chest now.  Multiple sick contacts at home.    Past Medical History:  Diagnosis Date   Anxiety    Asthma    Albuterol prn    CHF (congestive heart failure) (HCC)    takes Furosemide daily   Chronic back pain    DDD/scoliosis   Complication of anesthesia    hard to wake up   COPD (chronic obstructive pulmonary disease) (HCC)    DDD (degenerative disc disease)    Depression    takes Celexa,Clonazepam daily   Diabetes mellitus    borderline   Diverticulitis    Edema of lower extremity    Emphysema lung (HCC)    Fibromyalgia    Gastritis    GERD (gastroesophageal reflux disease)    takes Pepcid daily   Gout    Gout of big toe    RIGHT   Hemorrhoids    Hiatal hernia    History of bronchitis    last time 4-19months ago    History of colon polyps    History of kidney stones    is in her left kidney and has been for couple of yrs and no problems   HTN (hypertension)    takes lisinopril daily   Hyperlipidemia    but doesn't take any meds   Hypothyroidism    takes Synthroid daily   Insomnia    doesn't take any meds for this   Joint pain    Joint swelling    Migraine    last 04/04/18   Migraine    MS (multiple sclerosis) ([place])    early stages   Muscle spasm    takes Flexeril daily as needed   Neuropathy    Osteoporosis    Phlebitis    Pneumonia    hx of;last time in 2014   PONV (postoperative nausea and vomiting)    Shortness of breath    with exertion or stressed    Sleep  apnea    sleep study done at least 45yrs ago;has CPAP but doesn't use   Stroke ([place])    left sided weakness   TIA (transient ischemic attack)    Hx: of   Urinary frequency     Patient Active Problem List   Diagnosis Date Noted   Baclofen overdose 05/17/2019   Hypothyroidism    Grade I diastolic dysfunction    AKI (acute kidney injury) ([place])    Anxiety and depression    Abdominal pain, chronic, epigastric 02/05/2018   Nausea with vomiting 02/05/2018   Fibromyalgia 11/02/2017   Feeling of chest tightness 04/12/2015   SOB (shortness of breath) 04/12/2015   Hyperglycemia 04/08/2015   Tobacco use disorder 04/08/2015   Headache    Left-sided weakness    Hyperlipidemia 04/06/2015   Migraine 04/05/2015   Acute CVA (cerebrovascular accident) ([place]) 04/05/2015   Acute blood loss anemia    Hematochezia 11/15/2014   Bilateral lower abdominal cramping 11/15/2014   Rectal bleeding 11/15/2014  Colitis, acute 11/15/2014   Malnutrition of moderate degree ([place]) 04/17/2014   Postoperative anemia due to acute blood loss 04/17/2014    Class: Acute   Malnourished ([place]) 04/17/2014    Class: Present on Admission   Spinal stenosis, lumbar region, 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    Class: Chronic   Spondylolisthesis at L4-L5 level 04/14/2014   Spondylosis of cervical spine 12/30/2013   Cervical spondylosis without myelopathy 12/30/2013    Class: Chronic   Spondylolisthesis of lumbar region 12/30/2013    Class: Chronic   CAP (community acquired pneumonia) 08/24/2013   GERD (gastroesophageal reflux disease) 08/24/2013   COPD (chronic obstructive pulmonary disease) ([place]) 08/24/2013   Chronic pain syndrome 08/24/2013   Cigarette smoker 08/24/2013   Chest pain 08/24/2013   COPD exacerbation ([place]) 08/11/2012   DM (diabetes mellitus) ([place]) 08/11/2012   Leucocytosis 08/11/2012   Hypertension 08/11/2012    Past Surgical History:  Procedure Laterality Date   ABDOMINAL HYSTERECTOMY      ANTERIOR CERVICAL DECOMP/DISCECTOMY FUSION N/A 12/30/2013   Procedure: ANTERIOR CERVICAL DISCECTOMY FUSION C4-5, C5-6, plate and screws, allograft, local bone graft;  Surgeon: Jessy Oto, MD;  Location: [place];  Service: Orthopedics;  Laterality: N/A;   BACK SURGERY     lumbar discectomy   BIOPSY N/A 01/20/2015   Procedure: COLON BIOPSY;  Surgeon: Danie Binder, MD;  Location: [facility];  Service: Endoscopy;  Laterality: N/A;   BIOPSY  05/22/2018   Procedure: BIOPSY;  Surgeon: Danie Binder, MD;  [facility];  Service: Endoscopy;;  gastric   BLADDER SUSPENSION     CARDIAC CATHETERIZATION     CARPAL TUNNEL RELEASE Right    CHOLECYSTECTOMY     COLONOSCOPY     COLONOSCOPY WITH PROPOFOL N/A 01/20/2015   SLF: 1. Normal ileum 2. Redundant left colon 3. Smalll internal hemorrhoids.    ESOPHAGOGASTRODUODENOSCOPY  10/29/07   normal   ESOPHAGOGASTRODUODENOSCOPY (EGD) WITH PROPOFOL N/A 05/22/2018   Procedure: ESOPHAGOGASTRODUODENOSCOPY (EGD) WITH PROPOFOL;  Surgeon: Danie Binder, MD;  [facility];  Service: Endoscopy;  Laterality: N/A;  9:30am   fundic gland polyp     benign   POLYPECTOMY N/A 01/20/2015   Procedure: RECTAL POLYPECTOMY;  Surgeon: Danie Binder, MD;  Location: [facility];  Service: Endoscopy;  Laterality: N/A;   SIGMOIDOSCOPY  01/31/08   large internal hemorrhoids/small rectal polyp removed/rare sigmoid diverticula    OB History     Gravida  2   Para  2   Term  0   Preterm  2   AB      Living  2      SAB      IAB      Ectopic      Multiple      Live Births               Home Medications    Prior to Admission medications   Medication Sig Start Date End Date Taking? Authorizing Provider  albuterol (VENTOLIN HFA) 108 (90 Base) MCG/ACT inhaler Inhale 2 puffs into the lungs every 4 (four) hours as needed for wheezing or shortness of breath. 08/17/22  Yes Volney American, PA-C  azithromycin (ZITHROMAX) 250 MG tablet Take first 2  tablets together, then 1 every day until finished. 08/17/22  Yes Volney American, PA-C  predniSONE (DELTASONE) 20 MG tablet Take 2 tablets (40 mg total) by mouth daily with breakfast. 08/17/22  Yes Volney American, PA-C  promethazine-dextromethorphan (PROMETHAZINE-DM) 6.25-15 MG/5ML syrup Take 5 mLs by mouth 4 (four) times daily as needed. 08/17/22  Yes Particia Nearing, PA-C  albuterol (VENTOLIN HFA) 108 (90 Base) MCG/ACT inhaler Inhale into the lungs. 07/09/19   [provider]  AMITIZA 24 MCG capsule  08/13/19   [provider]  amLODipine (NORVASC) 10 MG tablet Take 1 tablet (10 mg total) by mouth daily. 09/03/19 12/02/19  O'NealRonnald Ramp, MD  bismuth subsalicylate (PEPTO BISMOL) 262 MG chewable tablet Chew 524 mg by mouth as needed for indigestion or diarrhea or loose stools.    [provider]  Buprenorphine HCl-Naloxone HCl (SUBOXONE) 8-2 MG FILM Dissolve 1 film under the tongue three times daily 04/21/21     Buprenorphine HCl-Naloxone HCl 8-2 MG FILM  08/13/19   [provider]  Buprenorphine HCl-Naloxone HCl 8-2 MG FILM Dissolve 1 film under the tongue 3 (three) times daily. 02/24/21     Buprenorphine HCl-Naloxone HCl 8-2 MG FILM Dissolve 1 film under the tongue 3 times daily 02/24/21     Buprenorphine HCl-Naloxone HCl 8-2 MG FILM Dissolve 1 film under tongue 3 times daily. 03/24/21     citalopram (CELEXA) 40 MG tablet Take 40 mg by mouth daily.    [provider]  clonazePAM (KLONOPIN) 0.5 MG tablet Take 1 tablet (0.5 mg total) by mouth 2 (two) times daily as needed. 03/19/21     clonazePAM (KLONOPIN) 0.5 MG tablet Take 1 (one) Tablet by mouth two times daily, as needed, Call 7 days before you run out. 05/15/21     cyclobenzaprine (FLEXERIL) 10 MG tablet Take 1 tablet (10 mg total) by mouth 3 (three) times daily as needed for muscle spasms. 02/16/18   Aliene Beams, MD  diltiazem (CARDIZEM SR) 120 MG 12 hr capsule Take 1 capsule  (120 mg total) by mouth once for 1 dose. Take one hour prior to  Coronary CTA 09/03/19 09/03/19  Sande Rives, MD  esomeprazole (NEXIUM) 20 MG capsule Take 1 capsule (20 mg total) by mouth daily at 12 noon. 09/04/19   O'NealRonnald Ramp, MD  levothyroxine (SYNTHROID) 175 MCG tablet Take 175 mcg by mouth daily before breakfast.    [provider]  lisinopril (ZESTRIL) 40 MG tablet Take 1 tablet (40 mg total) by mouth daily. 09/03/19   O'Neal, Ronnald Ramp, MD  nitroGLYCERIN (NITROSTAT) 0.4 MG SL tablet Place 1 tablet (0.4 mg total) under the tongue every 5 (five) minutes as needed for chest pain. 08/14/19 11/12/19  O'NealRonnald Ramp, MD  predniSONE (DELTASONE) 10 MG tablet Take 2 tablets (20 mg total) by mouth daily. 08/24/20   Avegno, Zachery Dakins, FNP  promethazine (PHENERGAN) 25 MG tablet  08/13/19   [provider]  promethazine (PHENERGAN) 25 MG tablet TAKE 1/2 TO 1 TABLET BY MOUTH 3 TIMES DAILY AS NEEDED FOR NAUSEA OR VOMITING 01/27/21 01/27/22  Roselie Awkward, MD  promethazine (PHENERGAN) 25 MG tablet TAKE 1/2 TO 1 TABLET BY MOUTH THREE TIMES A DAY AS NEEDED FOR NAUSEA OR VOMITING 12/30/20 12/30/21  Roselie Awkward, MD  promethazine (PHENERGAN) 25 MG tablet TAKE 1/2-1 TABLET BY MOUTH 3 TIMES DAILY AS NEEDED FOR NAUSEA OR VOMITING 11/30/20 11/30/21  Roselie Awkward, MD  promethazine (PHENERGAN) 25 MG tablet TAKE 1/2-1 TABLET BY MOUTH THREE TIMES PER DAY AS NEEDED FOR NAUSEA OR VOMITING 11/02/20 11/02/21  Roselie Awkward, MD  promethazine (PHENERGAN) 25 MG tablet TAKE 1/2 TO 1 TABLET BY MOUTH 3 TIMES DAILY AS NEEDED FOR NAUSEA  AND/OR VOMITING 10/06/20 10/06/21  Brandy Hale, MD  promethazine (PHENERGAN) 25 MG tablet TAKE 1/2 TO 1 TABLET BY MOUTH THREE TIMES DAILY AS NEEDED FOR NAUSEA AND/OR VOMITING 09/08/20 09/08/21  Brandy Hale, MD  promethazine (PHENERGAN) 25 MG tablet TAKE 1/2 TO 1 TABLET BY MOUTH THREE TIMES DAILY AS NEEDED FOR NAUSEA AND/OR VOMITING 08/11/20  08/11/21  Brandy Hale, MD  promethazine (PHENERGAN) 25 MG tablet Take 1/2-1 tablet by mouth three times per day as needed for nausea or vomiting 04/21/21     rosuvastatin (CRESTOR) 40 MG tablet Take 1 tablet (40 mg total) by mouth daily. 08/14/19   Geralynn Rile, MD  sertraline (ZOLOFT) 50 MG tablet Take 1 tablet by mouth daily 05/14/21     traZODone (DESYREL) 150 MG tablet Take 150 mg by mouth at bedtime.  12/15/15   [provider]    Family History Family History  Problem Relation Age of Onset   Diabetes Mother    Hypertension Mother    Arthritis Father    Heart failure Father    Diabetes Sister    Neuropathy Sister    Transient ischemic attack Sister    Heart disease Sister    Hypertension Sister    Cancer - Lung Other    Cancer - Prostate Other    Cancer - Other Other    Colon cancer Paternal Uncle    Colon cancer Brother        deceased age 44 from colon CA and brain CA    Social History Social History   Tobacco Use   Smoking status: Some Days    Packs/day: 0.75    Years: 15.00    Total pack years: 11.25    Types: Cigarettes   Smokeless tobacco: Never  Vaping Use   Vaping Use: Never used  Substance Use Topics   Alcohol use: No   Drug use: No     Allergies   Demerol [meperidine], Imitrex [sumatriptan], Penicillins, Sulfa antibiotics, Abilify [aripiprazole], Levaquin [levofloxacin in d5w], Neurontin [gabapentin], Toradol [ketorolac tromethamine], Tramadol, Ceclor [cefaclor], and Doxycycline   Review of Systems Review of Systems Per HPI  Physical Exam Triage Vital Signs ED Triage Vitals  Enc Vitals Group     BP 08/17/22 1650 (!) 183/77     Pulse Rate 08/17/22 1650 80     Resp 08/17/22 1650 20     Temp 08/17/22 1650 98.1 F (36.7 C)     Temp Source 08/17/22 1650 Oral     SpO2 08/17/22 1650 93 %     Weight --      Height --      Head Circumference --      Peak Flow --      Pain Score 08/17/22 1736 0     Pain Loc --      Pain Edu?  --      Excl. in [place]? --    No data found.  Updated Vital Signs BP (!) 183/77 (BP Location: Right Arm)   Pulse 74   Temp 98.1 F (36.7 C) (Oral)   Resp 20   SpO2 93%   Visual Acuity Right Eye Distance:   Left Eye Distance:   Bilateral Distance:    Right Eye Near:   Left Eye Near:    Bilateral Near:     Physical Exam Vitals and nursing note reviewed.  Constitutional:      Appearance: Normal appearance. She is not ill-appearing.  HENT:     Head: Atraumatic.  Right Ear: Tympanic membrane and external ear normal.     Left Ear: Tympanic membrane and external ear normal.     Nose: Rhinorrhea present.     Mouth/Throat:     Mouth: Mucous membranes are moist.     Pharynx: Posterior oropharyngeal erythema present.  Eyes:     Extraocular Movements: Extraocular movements intact.     Conjunctiva/sclera: Conjunctivae normal.  Cardiovascular:     Rate and Rhythm: Normal rate and regular rhythm.     Heart sounds: Normal heart sounds.  Pulmonary:     Effort: Pulmonary effort is normal.     Breath sounds: Wheezing present.     Comments: Decreased breath sounds throughout, significant wheezes.  No rales appreciable on exam.  Speaking in full sentences, breathing comfortably on room air Musculoskeletal:        General: Normal range of motion.     Cervical back: Normal range of motion and neck supple.  Skin:    General: Skin is warm and dry.  Neurological:     Mental Status: She is alert and oriented to person, place, and time.  Psychiatric:        Mood and Affect: Mood normal.        Thought Content: Thought content normal.        Judgment: Judgment normal.      UC Treatments / Results  Labs (all labs ordered are listed, but only abnormal results are displayed) Labs Reviewed - No data to display  EKG   Radiology No results found.  Procedures Procedures (including critical care time)  Medications Ordered in UC Medications  ipratropium-albuterol (DUONEB) 0.5-2.5  (3) MG/3ML nebulizer solution 3 mL (3 mLs Nebulization Given 08/17/22 1725)    Initial Impression / Assessment and Plan / UC Course  I have reviewed the triage vital signs and the nursing notes.  Pertinent labs & imaging results that were available during my care of the patient were reviewed by me and considered in my medical decision making (see chart for details).     Hypertensive in triage, otherwise vital signs stable.  DuoNeb treatment given in clinic with some relief of her wheezing, will treat for COPD exacerbation secondary to viral upper respiratory infection with prednisone, azithromycin, Phenergan DM, albuterol inhaler.  Declines viral testing today.  Discussed ported over-the-counter medications and home care.  Return for any worsening symptoms.  Final Clinical Impressions(s) / UC Diagnoses   Final diagnoses:  Viral URI with cough  COPD exacerbation [facility])   Discharge Instructions   None    ED Prescriptions     Medication Sig Dispense Auth. Provider   predniSONE (DELTASONE) 20 MG tablet Take 2 tablets (40 mg total) by mouth daily with breakfast. 10 tablet Volney American, PA-C   azithromycin (ZITHROMAX) 250 MG tablet Take first 2 tablets together, then 1 every day until finished. 6 tablet Volney American, [place]   promethazine-dextromethorphan (PROMETHAZINE-DM) 6.25-15 MG/5ML syrup Take 5 mLs by mouth 4 (four) times daily as needed. 100 mL Volney American, PA-C   albuterol (VENTOLIN HFA) 108 (90 Base) MCG/ACT inhaler Inhale 2 puffs into the lungs every 4 (four) hours as needed for wheezing or shortness of breath. 18 g Volney American, [place]      PDMP not reviewed this encounter.   Volney American, [place] 08/17/22 1759

## 2022-09-08 DIAGNOSIS — R69 Illness, unspecified: Secondary | ICD-10-CM | POA: Diagnosis not present

## 2022-09-13 DIAGNOSIS — R69 Illness, unspecified: Secondary | ICD-10-CM | POA: Diagnosis not present

## 2022-09-13 DIAGNOSIS — F329 Major depressive disorder, single episode, unspecified: Secondary | ICD-10-CM | POA: Diagnosis not present

## 2022-09-15 DIAGNOSIS — R69 Illness, unspecified: Secondary | ICD-10-CM | POA: Diagnosis not present

## 2022-09-22 DIAGNOSIS — R69 Illness, unspecified: Secondary | ICD-10-CM | POA: Diagnosis not present

## 2022-10-06 DIAGNOSIS — R69 Illness, unspecified: Secondary | ICD-10-CM | POA: Diagnosis not present

## 2022-10-19 DIAGNOSIS — R69 Illness, unspecified: Secondary | ICD-10-CM | POA: Diagnosis not present

## 2022-10-24 DIAGNOSIS — B370 Candidal stomatitis: Secondary | ICD-10-CM | POA: Diagnosis not present

## 2022-10-24 DIAGNOSIS — E785 Hyperlipidemia, unspecified: Secondary | ICD-10-CM | POA: Diagnosis not present

## 2022-10-24 DIAGNOSIS — R251 Tremor, unspecified: Secondary | ICD-10-CM | POA: Diagnosis not present

## 2022-10-24 DIAGNOSIS — E039 Hypothyroidism, unspecified: Secondary | ICD-10-CM | POA: Diagnosis not present

## 2022-10-24 DIAGNOSIS — F17200 Nicotine dependence, unspecified, uncomplicated: Secondary | ICD-10-CM | POA: Diagnosis not present

## 2022-10-24 DIAGNOSIS — R69 Illness, unspecified: Secondary | ICD-10-CM | POA: Diagnosis not present

## 2022-10-24 DIAGNOSIS — J441 Chronic obstructive pulmonary disease with (acute) exacerbation: Secondary | ICD-10-CM | POA: Diagnosis not present

## 2022-10-24 DIAGNOSIS — E559 Vitamin D deficiency, unspecified: Secondary | ICD-10-CM | POA: Diagnosis not present

## 2022-10-24 DIAGNOSIS — M797 Fibromyalgia: Secondary | ICD-10-CM | POA: Diagnosis not present

## 2022-10-24 DIAGNOSIS — N189 Chronic kidney disease, unspecified: Secondary | ICD-10-CM | POA: Diagnosis not present

## 2022-10-24 DIAGNOSIS — R7303 Prediabetes: Secondary | ICD-10-CM | POA: Diagnosis not present

## 2022-10-24 DIAGNOSIS — I10 Essential (primary) hypertension: Secondary | ICD-10-CM | POA: Diagnosis not present

## 2022-10-25 DIAGNOSIS — R69 Illness, unspecified: Secondary | ICD-10-CM | POA: Diagnosis not present

## 2022-10-25 DIAGNOSIS — F329 Major depressive disorder, single episode, unspecified: Secondary | ICD-10-CM | POA: Diagnosis not present

## 2022-11-02 DIAGNOSIS — R69 Illness, unspecified: Secondary | ICD-10-CM | POA: Diagnosis not present

## 2022-12-20 ENCOUNTER — Ambulatory Visit
Admission: EM | Admit: 2022-12-20 | Discharge: 2022-12-20 | Disposition: A | Discharge status: Home / Self Care | Payer: Self-pay

## 2022-12-20 DIAGNOSIS — J069 Acute upper respiratory infection, unspecified: Secondary | ICD-10-CM | POA: Insufficient documentation

## 2022-12-20 DIAGNOSIS — Z1152 Encounter for screening for COVID-19: Secondary | ICD-10-CM | POA: Insufficient documentation

## 2022-12-20 DIAGNOSIS — J441 Chronic obstructive pulmonary disease with (acute) exacerbation: Secondary | ICD-10-CM | POA: Insufficient documentation

## 2022-12-20 MED ORDER — AZITHROMYCIN 250 MG PO TABS: ORAL_TABLET | ORAL | 0 refills | Status: DC

## 2022-12-20 MED ORDER — PREDNISONE 20 MG PO TABS: 40.0000 mg | ORAL_TABLET | Freq: Every day | ORAL | 0 refills | Status: AC

## 2022-12-20 MED ORDER — IPRATROPIUM BROMIDE 0.02 % IN SOLN
0.5000 mg | Freq: Once | RESPIRATORY_TRACT | Status: AC
  Administered 2022-12-20: 0.5 mg via RESPIRATORY_TRACT

## 2022-12-20 MED ORDER — ALBUTEROL SULFATE (2.5 MG/3ML) 0.083% IN NEBU
2.5000 mg | INHALATION_SOLUTION | Freq: Once | RESPIRATORY_TRACT | Status: AC
Start: 2022-12-20 — End: 2022-12-20
  Administered 2022-12-20: 2.5 mg via RESPIRATORY_TRACT

## 2022-12-20 MED ORDER — METHYLPREDNISOLONE SODIUM SUCC 125 MG IJ SOLR
60.0000 mg | Freq: Once | INTRAMUSCULAR | Status: AC
  Administered 2022-12-20: 60 mg via INTRAMUSCULAR

## 2022-12-20 MED ORDER — ALBUTEROL SULFATE HFA 108 (90 BASE) MCG/ACT IN AERS: 2.0000 | INHALATION_SPRAY | RESPIRATORY_TRACT | 0 refills | Status: DC | PRN

## 2022-12-20 NOTE — ED Triage Notes (Signed)
Pt reports cough, chest congestion and phlegm x 4 days.Dayquil and Nyquil gives no relief.

## 2022-12-20 NOTE — Discharge Instructions (Signed)
It sounds like you are having an exacerbation of your chronic breathing condition.  Please continue to use albuterol inhaler every 4 hours as needed for wheezing or shortness of breath.  We have given you a DuoNeb breathing treatment today which helped to break up some of the congestion and help open up your airway.  Your oxygen numbers increased after the breathing treatment.  I would recommend following up with the pulmonologist for further evaluation and treatment of the COPD to make sure you are on the right medicine.  We have also given you a shot of steroid today to help with the inflammation in your lungs.  Start taking oral prednisone tomorrow.  Start the azithromycin tonight.  Many times, exacerbations of COPD are caused by viruses.  We have tested you for COVID-19 today.  If you test positive, would recommend starting molnupiravir.  Make sure you are taking Mucinex 600 mg twice daily to help her get the congestion and make sure you are drinking plenty of water.

## 2022-12-20 NOTE — ED Provider Notes (Signed)
[facility]    [name]: [id number] Arrival date & time: 12/20/22  1417      History   Chief Complaint Chief Complaint  Patient presents with   Cough    HPI [name] is a 62 y.o. female.   Patient presents today for 1 week of symptoms, 4 days of congested cough.  She also endorses low-grade fever, shortness of breath and wheezing, chest tightness, chest and nasal congestion, runny nose, headache, nausea without vomiting, decreased appetite, and fatigue.  She denies chest pain, sore throat, ear pain, abdominal pain, vomiting, and diarrhea.  No known sick contacts.  Reports she has been using her albuterol inhaler more frequently which has not been very helpful.  Patient reports history of COPD.  Reports she currently smokes, however has not been able to as much because of her breathing.  Reports she has been compliant with her Symbicort as prescribed.    Past Medical History:  Diagnosis Date   Anxiety    Asthma    Albuterol prn    CHF (congestive heart failure) (HCC)    takes Furosemide daily   Chronic back pain    DDD/scoliosis   Complication of anesthesia    hard to wake up   COPD (chronic obstructive pulmonary disease) (HCC)    DDD (degenerative disc disease)    Depression    takes Celexa,Clonazepam daily   Diabetes mellitus    borderline   Diverticulitis    Edema of lower extremity    Emphysema lung (HCC)    Fibromyalgia    Gastritis    GERD (gastroesophageal reflux disease)    takes Pepcid daily   Gout    Gout of big toe    RIGHT   Hemorrhoids    Hiatal hernia    History of bronchitis    last time 4-96month ago    History of colon polyps    History of kidney stones    is in her left kidney and has been for couple of yrs and no problems   HTN (hypertension)    takes lisinopril daily   Hyperlipidemia    but doesn't take any meds   Hypothyroidism    takes Synthroid daily   Insomnia    doesn't take any meds for this   Joint pain     Joint swelling    Migraine    last 04/04/18   Migraine    MS (multiple sclerosis) ([facility]    early stages   Muscle spasm    takes Flexeril daily as needed   Neuropathy    Osteoporosis    Phlebitis    Pneumonia    hx of;last time in 2014   PONV (postoperative nausea and vomiting)    Shortness of breath    with exertion or stressed    Sleep apnea    sleep study done at least 814yrago;has CPAP but doesn't use   [facility]   left sided weakness   TIA (transient ischemic attack)    Hx: of   Urinary frequency     Patient Active Problem List   Diagnosis Date Noted   Baclofen overdose 05/17/2019   Hypothyroidism    Grade I diastolic dysfunction    AKI (acute kidney injury) ([facility]   Anxiety and depression    Abdominal pain, chronic, epigastric 02/05/2018   Nausea with vomiting 02/05/2018   Fibromyalgia 11/02/2017   Feeling of chest tightness 04/12/2015   SOB (shortness of breath) 04/12/2015  Hyperglycemia 04/08/2015   Tobacco use disorder 04/08/2015   Headache    Left-sided weakness    Hyperlipidemia 04/06/2015   Migraine 04/05/2015   Acute CVA (cerebrovascular accident) ([place]) 04/05/2015   Acute blood loss anemia    Hematochezia 11/15/2014   Bilateral lower abdominal cramping 11/15/2014   Rectal bleeding 11/15/2014   Colitis, acute 11/15/2014   Malnutrition of moderate degree ([place]) 04/17/2014   Postoperative anemia due to acute blood loss 04/17/2014    Class: Acute   Malnourished ([place]) 04/17/2014    Class: Present on Admission   Spinal stenosis, lumbar region, 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    Class: Chronic   Spondylolisthesis at L4-L5 level 04/14/2014   Spondylosis of cervical spine 12/30/2013   Cervical spondylosis without myelopathy 12/30/2013    Class: Chronic   Spondylolisthesis of lumbar region 12/30/2013    Class: Chronic   CAP (community acquired pneumonia) 08/24/2013   GERD (gastroesophageal reflux disease) 08/24/2013   COPD (chronic  obstructive pulmonary disease) ([place]) 08/24/2013   Chronic pain syndrome 08/24/2013   Cigarette smoker 08/24/2013   Chest pain 08/24/2013   COPD exacerbation ([place]) 08/11/2012   DM (diabetes mellitus) ([place]) 08/11/2012   Leucocytosis 08/11/2012   Hypertension 08/11/2012    Past Surgical History:  Procedure Laterality Date   ABDOMINAL HYSTERECTOMY     ANTERIOR CERVICAL DECOMP/DISCECTOMY FUSION N/A 12/30/2013   Procedure: ANTERIOR CERVICAL DISCECTOMY FUSION C4-5, C5-6, plate and screws, allograft, local bone graft;  Surgeon: Jessy Oto, MD;  Location: [place];  Service: Orthopedics;  Laterality: N/A;   BACK SURGERY     lumbar discectomy   BIOPSY N/A 01/20/2015   Procedure: COLON BIOPSY;  Surgeon: Danie Binder, MD;  Location: [facility];  Service: Endoscopy;  Laterality: N/A;   BIOPSY  05/22/2018   Procedure: BIOPSY;  Surgeon: Danie Binder, MD;  [facility];  Service: Endoscopy;;  gastric   BLADDER SUSPENSION     CARDIAC CATHETERIZATION     CARPAL TUNNEL RELEASE Right    CHOLECYSTECTOMY     COLONOSCOPY     COLONOSCOPY WITH PROPOFOL N/A 01/20/2015   SLF: 1. Normal ileum 2. Redundant left colon 3. Smalll internal hemorrhoids.    ESOPHAGOGASTRODUODENOSCOPY  10/29/07   normal   ESOPHAGOGASTRODUODENOSCOPY (EGD) WITH PROPOFOL N/A 05/22/2018   Procedure: ESOPHAGOGASTRODUODENOSCOPY (EGD) WITH PROPOFOL;  Surgeon: Danie Binder, MD;  [facility];  Service: Endoscopy;  Laterality: N/A;  9:30am   fundic gland polyp     benign   POLYPECTOMY N/A 01/20/2015   Procedure: RECTAL POLYPECTOMY;  Surgeon: Danie Binder, MD;  Location: [facility];  Service: Endoscopy;  Laterality: N/A;   SIGMOIDOSCOPY  01/31/08   large internal hemorrhoids/small rectal polyp removed/rare sigmoid diverticula    OB History     Gravida  2   Para  2   Term  0   Preterm  2   AB      Living  2      SAB      IAB      Ectopic      Multiple      Live Births               Home  Medications    Prior to Admission medications   Medication Sig Start Date End Date Taking? Authorizing Provider  azithromycin (ZITHROMAX) 250 MG tablet Take (2) tablets by mouth on day 1, then take (1) tablet by mouth on days 2-5. 12/20/22  Yes Noemi Chapel A, NP  Phenyleph-Doxylamine-DM-APAP (VICKS DAYQUIL/NYQUIL CLD & FLU PO) Take by mouth.   Yes [provider]  predniSONE (DELTASONE) 20 MG tablet Take 2 tablets (40 mg total) by mouth daily with breakfast for 5 days. 12/20/22 12/25/22 Yes Eulogio Bear, NP  albuterol (VENTOLIN HFA) 108 (90 Base) MCG/ACT inhaler Inhale 2 puffs into the lungs every 4 (four) hours as needed for wheezing or shortness of breath. 12/20/22   Eulogio Bear, NP  AMITIZA 24 MCG capsule  08/13/19   [provider]  amLODipine (NORVASC) 10 MG tablet Take 1 tablet (10 mg total) by mouth daily. 09/03/19 12/02/19  O'NealCassie Freer, MD  bismuth subsalicylate (PEPTO BISMOL) 262 MG chewable tablet Chew 524 mg by mouth as needed for indigestion or diarrhea or loose stools.    [provider]  Buprenorphine HCl-Naloxone HCl (SUBOXONE) 8-2 MG FILM Dissolve 1 film under the tongue three times daily 04/21/21     Buprenorphine HCl-Naloxone HCl 8-2 MG FILM  08/13/19   [provider]  Buprenorphine HCl-Naloxone HCl 8-2 MG FILM Dissolve 1 film under the tongue 3 (three) times daily. 02/24/21     Buprenorphine HCl-Naloxone HCl 8-2 MG FILM Dissolve 1 film under the tongue 3 times daily 02/24/21     Buprenorphine HCl-Naloxone HCl 8-2 MG FILM Dissolve 1 film under tongue 3 times daily. 03/24/21     citalopram (CELEXA) 40 MG tablet Take 40 mg by mouth daily.    [provider]  clonazePAM (KLONOPIN) 0.5 MG tablet Take 1 tablet (0.5 mg total) by mouth 2 (two) times daily as needed. 03/19/21     clonazePAM (KLONOPIN) 0.5 MG tablet Take 1 (one) Tablet by mouth two times daily, as needed, Call 7 days before you run out. 05/15/21     diltiazem  (CARDIZEM SR) 120 MG 12 hr capsule Take 1 capsule (120 mg total) by mouth once for 1 dose. Take one hour prior to  Coronary CTA 09/03/19 09/03/19  Geralynn Rile, MD  esomeprazole (NEXIUM) 20 MG capsule Take 1 capsule (20 mg total) by mouth daily at 12 noon. 09/04/19   O'NealCassie Freer, MD  levothyroxine (SYNTHROID) 175 MCG tablet Take 175 mcg by mouth daily before breakfast.    [provider]  lisinopril (ZESTRIL) 40 MG tablet Take 1 tablet (40 mg total) by mouth daily. 09/03/19   O'Neal, Cassie Freer, MD  nitroGLYCERIN (NITROSTAT) 0.4 MG SL tablet Place 1 tablet (0.4 mg total) under the tongue every 5 (five) minutes as needed for chest pain. 08/14/19 11/12/19  Geralynn Rile, MD  promethazine (PHENERGAN) 25 MG tablet TAKE 1/2-1 TABLET BY MOUTH 3 TIMES DAILY AS NEEDED FOR NAUSEA OR VOMITING 11/30/20 11/30/21  Brandy Hale, MD  rosuvastatin (CRESTOR) 40 MG tablet Take 1 tablet (40 mg total) by mouth daily. 08/14/19   Geralynn Rile, MD  sertraline (ZOLOFT) 50 MG tablet Take 1 tablet by mouth daily 05/14/21     traZODone (DESYREL) 150 MG tablet Take 150 mg by mouth at bedtime.  12/15/15   [provider]    Family History Family History  Problem Relation Age of Onset   Diabetes Mother    Hypertension Mother    Arthritis Father    Heart failure Father    Diabetes Sister    Neuropathy Sister    Transient ischemic attack Sister    Heart disease Sister    Hypertension Sister    Cancer - Lung Other  Cancer - Prostate Other    Cancer - Other Other    Colon cancer Paternal Uncle    Colon cancer Brother        deceased age 35 from colon CA and brain CA    Social History Social History   Tobacco Use   Smoking status: Some Days    Packs/day: 0.75    Years: 15.00    Total pack years: 11.25    Types: Cigarettes   Smokeless tobacco: Never  Vaping Use   Vaping Use: Never used  Substance Use Topics   Alcohol use: No   Drug use: No      Allergies   Demerol [meperidine], Imitrex [sumatriptan], Penicillins, Sulfa antibiotics, Abilify [aripiprazole], Levaquin [levofloxacin in d5w], Neurontin [gabapentin], Toradol [ketorolac tromethamine], Tramadol, Ceclor [cefaclor], and Doxycycline   Review of Systems Review of Systems Per HPI  Physical Exam Triage Vital Signs ED Triage Vitals  Enc Vitals Group     BP 12/20/22 1426 (!) 171/83     Pulse Rate 12/20/22 1426 94     Resp 12/20/22 1426 20     Temp 12/20/22 1426 98.3 F (36.8 C)     Temp Source 12/20/22 1426 Oral     SpO2 12/20/22 1426 93 %     Weight --      Height --      Head Circumference --      Peak Flow --      Pain Score 12/20/22 1423 0     Pain Loc --      Pain Edu? --      Excl. in [place]? --    No data found.  Updated Vital Signs BP (!) 171/83 (BP Location: Right Arm)   Pulse 81   Temp 98.3 F (36.8 C) (Oral)   Resp 20   SpO2 96%   Visual Acuity Right Eye Distance:   Left Eye Distance:   Bilateral Distance:    Right Eye Near:   Left Eye Near:    Bilateral Near:     Physical Exam Vitals and nursing note reviewed.  Constitutional:      General: She is not in acute distress.    Appearance: Normal appearance. She is not ill-appearing or toxic-appearing.  HENT:     Head: Normocephalic and atraumatic.     Right Ear: Tympanic membrane, ear canal and external ear normal.     Left Ear: Tympanic membrane, ear canal and external ear normal.     Nose: Congestion and rhinorrhea present.     Mouth/Throat:     Mouth: Mucous membranes are moist.     Pharynx: Oropharynx is clear. Posterior oropharyngeal erythema present. No oropharyngeal exudate.  Eyes:     General: No scleral icterus.    Extraocular Movements: Extraocular movements intact.  Cardiovascular:     Rate and Rhythm: Normal rate and regular rhythm.  Pulmonary:     Effort: Pulmonary effort is normal. No respiratory distress.     Breath sounds: Wheezing present. No rhonchi or rales.   Abdominal:     General: Abdomen is flat. Bowel sounds are normal. There is no distension.     Palpations: Abdomen is soft.     Tenderness: There is no abdominal tenderness.  Musculoskeletal:     Cervical back: Normal range of motion and neck supple.  Lymphadenopathy:     Cervical: No cervical adenopathy.  Skin:    General: Skin is warm and dry.     Coloration: Skin is not  jaundiced or pale.     Findings: No erythema or rash.  Neurological:     Mental Status: She is alert and oriented to person, place, and time.  Psychiatric:        Behavior: Behavior is cooperative.      UC Treatments / Results  Labs (all labs ordered are listed, but only abnormal results are displayed) Labs Reviewed - No data to display  EKG   Radiology No results found.  Procedures Procedures (including critical care time)  Medications Ordered in UC Medications  ipratropium (ATROVENT) nebulizer solution 0.5 mg (0.5 mg Nebulization Given 12/20/22 1446)  albuterol (PROVENTIL) (2.5 MG/3ML) 0.083% nebulizer solution 2.5 mg (2.5 mg Nebulization Given 12/20/22 1446)  methylPREDNISolone sodium succinate (SOLU-MEDROL) 125 mg/2 mL injection 60 mg (60 mg Intramuscular Given 12/20/22 1446)    Initial Impression / Assessment and Plan / UC Course  I have reviewed the triage vital signs and the nursing notes.  Pertinent labs & imaging results that were available during my care of the patient were reviewed by me and considered in my medical decision making (see chart for details).   In triage, patient is hypertensive, otherwise vital signs are stable.  Initially in triage, she is oxygenating 93% on room air.  After DuoNeb breathing treatment, oxygenation increased to 96% on room air.  1. COPD exacerbation ([place]) 2. Viral URI with cough 3. Encounter for screening for COVID-19 COVID-19 test is pending; patient is a good candidate for molnupiravir if she test positive DuoNeb given with increased oxygenation,  increasing wheezing Lung inflammation treated with Solu-Medrol 60 mg IM today Continue albuterol every 4-6 hours as needed for wheezing or shortness of breath Recommended close follow-up with pulmonologist Start oral prednisone tomorrow Start azithromycin Other supportive care discussed ER and return precautions also discussed  The patient was given the opportunity to ask questions.  All questions answered to their satisfaction.  The patient is in agreement to this plan.    Final Clinical Impressions(s) / UC Diagnoses   Final diagnoses:  COPD exacerbation ([place])  Viral URI with cough  Encounter for screening for COVID-19     Discharge Instructions      It sounds like you are having an exacerbation of your chronic breathing condition.  Please continue to use albuterol inhaler every 4 hours as needed for wheezing or shortness of breath.  We have given you a DuoNeb breathing treatment today which helped to break up some of the congestion and help open up your airway.  Your oxygen numbers increased after the breathing treatment.  I would recommend following up with the pulmonologist for further evaluation and treatment of the COPD to make sure you are on the right medicine.  We have also given you a shot of steroid today to help with the inflammation in your lungs.  Start taking oral prednisone tomorrow.  Start the azithromycin tonight.  Many times, exacerbations of COPD are caused by viruses.  We have tested you for COVID-19 today.  If you test positive, would recommend starting molnupiravir.  Make sure you are taking Mucinex 600 mg twice daily to help her get the congestion and make sure you are drinking plenty of water.      ED Prescriptions     Medication Sig Dispense Auth. Provider   albuterol (VENTOLIN HFA) 108 (90 Base) MCG/ACT inhaler Inhale 2 puffs into the lungs every 4 (four) hours as needed for wheezing or shortness of breath. 18 g Eulogio Bear, NP  predniSONE  (DELTASONE) 20 MG tablet Take 2 tablets (40 mg total) by mouth daily with breakfast for 5 days. 10 tablet Noemi Chapel A, NP   azithromycin (ZITHROMAX) 250 MG tablet Take (2) tablets by mouth on day 1, then take (1) tablet by mouth on days 2-5. 6 tablet Eulogio Bear, NP      PDMP not reviewed this encounter.   Eulogio Bear, NP 12/20/22 1513

## 2022-12-21 LAB — SARS CORONAVIRUS 2 (TAT 6-24 HRS): SARS Coronavirus 2: NEGATIVE

## 2023-02-08 ENCOUNTER — Other Ambulatory Visit (HOSPITAL_COMMUNITY): Payer: Self-pay | Admitting: Family Medicine

## 2023-02-08 DIAGNOSIS — Z1231 Encounter for screening mammogram for malignant neoplasm of breast: Secondary | ICD-10-CM

## 2023-02-22 ENCOUNTER — Ambulatory Visit (HOSPITAL_COMMUNITY): Payer: Self-pay

## 2023-06-07 NOTE — Progress Notes (Deleted)
[name], female    DOB: 10-02-1961    MRN: [id number]   Brief patient profile:  ***  yowf *** referred to pulmonary clinic in [place]  06/08/2023 by *** for ***      History of Present Illness  06/08/2023  Pulmonary/ 1st office eval/ Sherene Sires / [place] [facility]  No chief complaint on file.    Dyspnea:  *** Cough: *** Sleep: *** SABA use: *** 02: *** Lung cancer screen: ***   Outpatient Medications Prior to Visit  Medication Sig Dispense Refill   albuterol (VENTOLIN HFA) 108 (90 Base) MCG/ACT inhaler Inhale 2 puffs into the lungs every 4 (four) hours as needed for wheezing or shortness of breath. 18 g 0   AMITIZA 24 MCG capsule      amLODipine (NORVASC) 10 MG tablet Take 1 tablet (10 mg total) by mouth daily. 180 tablet 3   azithromycin (ZITHROMAX) 250 MG tablet Take (2) tablets by mouth on day 1, then take (1) tablet by mouth on days 2-5. 6 tablet 0   bismuth subsalicylate (PEPTO BISMOL) 262 MG chewable tablet Chew 524 mg by mouth as needed for indigestion or diarrhea or loose stools.     Buprenorphine HCl-Naloxone HCl (SUBOXONE) 8-2 MG FILM Dissolve 1 film under the tongue three times daily 84 each 0   Buprenorphine HCl-Naloxone HCl 8-2 MG FILM      Buprenorphine HCl-Naloxone HCl 8-2 MG FILM Dissolve 1 film under the tongue 3 (three) times daily. 42 each 0   Buprenorphine HCl-Naloxone HCl 8-2 MG FILM Dissolve 1 film under the tongue 3 times daily 84 each 0   Buprenorphine HCl-Naloxone HCl 8-2 MG FILM Dissolve 1 film under tongue 3 times daily. 84 each 0   citalopram (CELEXA) 40 MG tablet Take 40 mg by mouth daily.     clonazePAM (KLONOPIN) 0.5 MG tablet Take 1 tablet (0.5 mg total) by mouth 2 (two) times daily as needed. 60 tablet 0   clonazePAM (KLONOPIN) 0.5 MG tablet Take 1 (one) Tablet by mouth two times daily, as needed, Call 7 days before you run out. 28 tablet 0   diltiazem (CARDIZEM SR) 120 MG 12 hr capsule Take 1 capsule (120 mg total) by mouth once for 1 dose.  Take one hour prior to  Coronary CTA 1 capsule 0   esomeprazole (NEXIUM) 20 MG capsule Take 1 capsule (20 mg total) by mouth daily at 12 noon. 30 capsule 12   levothyroxine (SYNTHROID) 175 MCG tablet Take 175 mcg by mouth daily before breakfast.     lisinopril (ZESTRIL) 40 MG tablet Take 1 tablet (40 mg total) by mouth daily. 90 tablet 3   nitroGLYCERIN (NITROSTAT) 0.4 MG SL tablet Place 1 tablet (0.4 mg total) under the tongue every 5 (five) minutes as needed for chest pain. 25 tablet 4   Phenyleph-Doxylamine-DM-APAP (VICKS DAYQUIL/NYQUIL CLD & FLU PO) Take by mouth.     promethazine (PHENERGAN) 25 MG tablet TAKE 1/2-1 TABLET BY MOUTH 3 TIMES DAILY AS NEEDED FOR NAUSEA OR VOMITING 90 tablet 0   rosuvastatin (CRESTOR) 40 MG tablet Take 1 tablet (40 mg total) by mouth daily. 30 tablet 3   sertraline (ZOLOFT) 50 MG tablet Take 1 tablet by mouth daily 30 tablet 0   traZODone (DESYREL) 150 MG tablet Take 150 mg by mouth at bedtime.   0   No facility-administered medications prior to visit.    Past Medical History:  Diagnosis Date   Anxiety    Asthma  Albuterol prn    CHF (congestive heart failure) (HCC)    takes Furosemide daily   Chronic back pain    DDD/scoliosis   Complication of anesthesia    hard to wake up   COPD (chronic obstructive pulmonary disease) (HCC)    DDD (degenerative disc disease)    Depression    takes Celexa,Clonazepam daily   Diabetes mellitus    borderline   Diverticulitis    Edema of lower extremity    Emphysema lung (HCC)    Fibromyalgia    Gastritis    GERD (gastroesophageal reflux disease)    takes Pepcid daily   Gout    Gout of big toe    RIGHT   Hemorrhoids    Hiatal hernia    History of bronchitis    last time 4-29months ago    History of colon polyps    History of kidney stones    is in her left kidney and has been for couple of yrs and no problems   HTN (hypertension)    takes lisinopril daily   Hyperlipidemia    but doesn't take any meds    Hypothyroidism    takes Synthroid daily   Insomnia    doesn't take any meds for this   Joint pain    Joint swelling    Migraine    last 04/04/18   Migraine    MS (multiple sclerosis) (HCC)    early stages   Muscle spasm    takes Flexeril daily as needed   Neuropathy    Osteoporosis    Phlebitis    Pneumonia    hx of;last time in 2014   PONV (postoperative nausea and vomiting)    Shortness of breath    with exertion or stressed    Sleep apnea    sleep study done at least 83yrs ago;has CPAP but doesn't use   Stroke (HCC)    left sided weakness   TIA (transient ischemic attack)    Hx: of   Urinary frequency       Objective:     There were no vitals taken for this visit.         Assessment   No problem-specific Assessment & Plan notes found for this encounter.     Sandrea Hughs, MD 06/07/2023

## 2023-06-08 ENCOUNTER — Institutional Professional Consult (permissible substitution): Payer: PRIVATE HEALTH INSURANCE | Admitting: Internal Medicine

## 2023-08-24 ENCOUNTER — Other Ambulatory Visit: Payer: Self-pay | Admitting: Nurse Practitioner

## 2023-08-24 NOTE — Telephone Encounter (Signed)
Requested Prescriptions  Pending Prescriptions Disp Refills   albuterol (VENTOLIN HFA) 108 (90 Base) MCG/ACT inhaler [Pharmacy Med Name: ALBUTEROL HFA (VENTOLIN) INH] 18 each     Sig: INHALE 2 PUFFS INTO THE LUNGS EVERY 4 HOURS AS NEEDED FOR WHEEZING OR SHORTNESS OF BREATH.     Pulmonology:  Beta Agonists 2 Failed - 08/24/2023 12:24 PM      Failed - Last BP in normal range    BP Readings from Last 1 Encounters:  12/20/22 (!) 171/83         Failed - Valid encounter within last 12 months    Recent Outpatient Visits   None            Passed - Last Heart Rate in normal range    Pulse Readings from Last 1 Encounters:  12/20/22 81

## 2023-10-18 ENCOUNTER — Other Ambulatory Visit (HOSPITAL_COMMUNITY): Payer: Self-pay | Admitting: Family Medicine

## 2023-10-18 DIAGNOSIS — Z122 Encounter for screening for malignant neoplasm of respiratory organs: Secondary | ICD-10-CM

## 2023-10-18 DIAGNOSIS — F17200 Nicotine dependence, unspecified, uncomplicated: Secondary | ICD-10-CM

## 2023-11-09 ENCOUNTER — Ambulatory Visit (HOSPITAL_COMMUNITY): Payer: No Typology Code available for payment source

## 2023-11-09 ENCOUNTER — Encounter (HOSPITAL_COMMUNITY): Payer: Self-pay

## 2023-11-19 ENCOUNTER — Encounter: Payer: Self-pay | Admitting: Emergency Medicine

## 2023-11-19 ENCOUNTER — Other Ambulatory Visit: Payer: Self-pay

## 2023-11-19 ENCOUNTER — Ambulatory Visit
Admission: EM | Admit: 2023-11-19 | Discharge: 2023-11-19 | Disposition: A | Discharge status: Home / Self Care | Payer: No Typology Code available for payment source

## 2023-11-19 DIAGNOSIS — R030 Elevated blood-pressure reading, without diagnosis of hypertension: Secondary | ICD-10-CM

## 2023-11-19 DIAGNOSIS — J441 Chronic obstructive pulmonary disease with (acute) exacerbation: Secondary | ICD-10-CM | POA: Diagnosis not present

## 2023-11-19 MED ORDER — PREDNISONE 20 MG PO TABS: 40.0000 mg | ORAL_TABLET | Freq: Every day | ORAL | 0 refills | Status: DC

## 2023-11-19 MED ORDER — DEXAMETHASONE SODIUM PHOSPHATE 10 MG/ML IJ SOLN
10.0000 mg | Freq: Once | INTRAMUSCULAR | Status: AC
  Administered 2023-11-19: 10 mg via INTRAMUSCULAR

## 2023-11-19 MED ORDER — GUAIFENESIN ER 600 MG PO TB12: 600.0000 mg | ORAL_TABLET | Freq: Two times a day (BID) | ORAL | 0 refills | Status: DC

## 2023-11-19 MED ORDER — AZITHROMYCIN 250 MG PO TABS: ORAL_TABLET | ORAL | 0 refills | Status: DC

## 2023-11-19 NOTE — Discharge Instructions (Signed)
 We given you a steroid shot today and I have prescribed a course of prednisone , antibiotics and Mucinex  to help break up the congestion.  Use your rescue inhaler every 2-4 hours as needed.  Take big deep breaths to keep your lungs nice and inflated and you may even buy an incentive spirometer to help with this.  Go to the emergency department if worsening anytime

## 2023-11-19 NOTE — ED Triage Notes (Addendum)
 Pt reports productive cough,weakness, wheezing for last several days. Pt reports my oxygen  is probably low because I keep drifting in and out and going to sleep.   Hx of copd.has been using inhaler with no change in symptoms. Mild audible wheeze heard in triage post coughing episode. NAD noted. Pt alert, oriented. Able to speak clear complete sentences.

## 2023-11-22 ENCOUNTER — Emergency Department (HOSPITAL_COMMUNITY)
Admission: EM | Admit: 2023-11-22 | Discharge: 2023-11-22 | Disposition: A | Discharge status: Home / Self Care | Payer: No Typology Code available for payment source | Attending: Emergency Medicine | Admitting: Emergency Medicine

## 2023-11-22 ENCOUNTER — Other Ambulatory Visit: Payer: Self-pay

## 2023-11-22 ENCOUNTER — Encounter (HOSPITAL_COMMUNITY): Payer: Self-pay

## 2023-11-22 DIAGNOSIS — G35 Multiple sclerosis: Secondary | ICD-10-CM | POA: Insufficient documentation

## 2023-11-22 DIAGNOSIS — K529 Noninfective gastroenteritis and colitis, unspecified: Secondary | ICD-10-CM | POA: Diagnosis not present

## 2023-11-22 DIAGNOSIS — J449 Chronic obstructive pulmonary disease, unspecified: Secondary | ICD-10-CM | POA: Insufficient documentation

## 2023-11-22 DIAGNOSIS — R112 Nausea with vomiting, unspecified: Secondary | ICD-10-CM | POA: Diagnosis present

## 2023-11-22 DIAGNOSIS — E119 Type 2 diabetes mellitus without complications: Secondary | ICD-10-CM | POA: Insufficient documentation

## 2023-11-22 DIAGNOSIS — F17200 Nicotine dependence, unspecified, uncomplicated: Secondary | ICD-10-CM | POA: Diagnosis not present

## 2023-11-22 DIAGNOSIS — Z7952 Long term (current) use of systemic steroids: Secondary | ICD-10-CM | POA: Insufficient documentation

## 2023-11-22 DIAGNOSIS — Z7951 Long term (current) use of inhaled steroids: Secondary | ICD-10-CM | POA: Diagnosis not present

## 2023-11-22 LAB — COMPREHENSIVE METABOLIC PANEL
ALT: 27 U/L (ref 0–44)
AST: 18 U/L (ref 15–41)
Albumin: 3.4 g/dL — ABNORMAL LOW (ref 3.5–5.0)
Alkaline Phosphatase: 68 U/L (ref 38–126)
Anion gap: 8 (ref 5–15)
BUN: 31 mg/dL — ABNORMAL HIGH (ref 8–23)
CO2: 26 mmol/L (ref 22–32)
Calcium: 8.6 mg/dL — ABNORMAL LOW (ref 8.9–10.3)
Chloride: 107 mmol/L (ref 98–111)
Creatinine, Ser: 0.91 mg/dL (ref 0.44–1.00)
GFR, Estimated: >60 mL/min (ref >60)
Glucose, Bld: 143 mg/dL — ABNORMAL HIGH (ref 70–99)
Potassium: 3.6 mmol/L (ref 3.5–5.1)
Sodium: 141 mmol/L (ref 135–145)
Total Bilirubin: 0.6 mg/dL (ref 0.0–1.2)
Total Protein: 6.4 g/dL — ABNORMAL LOW (ref 6.5–8.1)

## 2023-11-22 LAB — CBC
HCT: 43.1 % (ref 36.0–46.0)
Hemoglobin: 14.3 g/dL (ref 12.0–15.0)
MCH: 29.7 pg (ref 26.0–34.0)
MCHC: 33.2 g/dL (ref 30.0–36.0)
MCV: 89.6 fL (ref 80.0–100.0)
Platelets: 295 10*3/uL (ref 150–400)
RBC: 4.81 MIL/uL (ref 3.87–5.11)
RDW: 14.8 % (ref 11.5–15.5)
WBC: 7.5 10*3/uL (ref 4.0–10.5)
nRBC: 0 % (ref 0.0–0.2)

## 2023-11-22 LAB — LIPASE, BLOOD: Lipase: 39 U/L (ref 11–51)

## 2023-11-22 MED ORDER — ONDANSETRON 4 MG PO TBDP: 4.0000 mg | ORAL_TABLET | Freq: Three times a day (TID) | ORAL | 0 refills | Status: DC | PRN

## 2023-11-22 MED ORDER — LOPERAMIDE HCL 2 MG PO TABS: 2.0000 mg | ORAL_TABLET | Freq: Four times a day (QID) | ORAL | 0 refills | Status: AC | PRN

## 2023-11-22 MED ORDER — PROMETHAZINE HCL 25 MG PO TABS: 25.0000 mg | ORAL_TABLET | Freq: Four times a day (QID) | ORAL | 0 refills | Status: AC | PRN

## 2023-11-22 MED ORDER — SODIUM CHLORIDE 0.9 % IV BOLUS
1000.0000 mL | Freq: Once | INTRAVENOUS | Status: AC
  Administered 2023-11-22: 1000 mL via INTRAVENOUS

## 2023-11-22 MED ORDER — SODIUM CHLORIDE 0.9 % IV SOLN
12.5000 mg | Freq: Once | INTRAVENOUS | Status: AC
  Administered 2023-11-22: 12.5 mg via INTRAVENOUS
  Filled 2023-11-22: qty 0.5

## 2023-11-22 MED ORDER — SODIUM CHLORIDE 0.9 % IV SOLN: INTRAVENOUS | Status: DC

## 2023-11-22 NOTE — Discharge Instructions (Addendum)
 Take the Zofran  ODT for nausea and vomiting.  Take the Phenergan  as needed for nausea and vomiting.  Also take the Imodium  A-D as needed for diarrhea.  For the day just small amounts of clear liquids frequently.  Until tolerated then advance to bland diet.  Return for any new or worse symptoms.  Make an appointment to follow-up with your doctors as needed.  Return for any blood in your bowel movements or any blood in your vomit.

## 2023-11-22 NOTE — ED Provider Notes (Addendum)
 [place] EMERGENCY DEPARTMENT AT [facility] Provider Note   [name]: [id number] Arrival date & time: 11/22/23  0946     History  Chief Complaint  Patient presents with   Emesis    [name] is a 63 y.o. female.  Acute onset of nausea vomiting and diarrhea yesterday.  Multiple episodes of vomiting and diarrhea no blood in it.  Patient feeling dehydrated.  Patient states she is got Zofran  at home that does not help her with nausea and vomiting.  So we will try Phenergan .  Patient's home medications include Suboxone .  There are other people at home with similar illness.  So this is probably a gastroenteritis and not withdrawal.  Past medical history significant for gastritis fibromyalgia multiple sclerosis COPD chronic back pain diabetes.  Past surgical history significant for abdominal hysterectomy patient's still an active smoker.       Home Medications Prior to Admission medications   Medication Sig Start Date End Date Taking? Authorizing Provider  albuterol  (VENTOLIN  HFA) 108 (90 Base) MCG/ACT inhaler Inhale 2 puffs into the lungs every 4 (four) hours as needed for wheezing or shortness of breath. 12/20/22   Wilhemena Harbour, NP  AMITIZA 24 MCG capsule  08/13/19   [provider]  amLODipine  (NORVASC ) 10 MG tablet Take 1 tablet (10 mg total) by mouth daily. 09/03/19 12/02/19  O'NealCathay Clonts, MD  azithromycin  (ZITHROMAX ) 250 MG tablet Take (2) tablets by mouth on day 1, then take (1) tablet by mouth on days 2-5. 11/19/23   Corbin Dess, PA-C  bismuth subsalicylate (PEPTO BISMOL) 262 MG chewable tablet Chew 524 mg by mouth as needed for indigestion or diarrhea or loose stools.    [provider]  Buprenorphine  HCl-Naloxone  HCl (SUBOXONE ) 8-2 MG FILM Dissolve 1 film under the tongue three times daily 04/21/21     Buprenorphine  HCl-Naloxone  HCl 8-2 MG FILM  08/13/19   [provider]  Buprenorphine  HCl-Naloxone  HCl 8-2 MG FILM  Dissolve 1 film under the tongue 3 (three) times daily. 02/24/21     Buprenorphine  HCl-Naloxone  HCl 8-2 MG FILM Dissolve 1 film under the tongue 3 times daily 02/24/21     Buprenorphine  HCl-Naloxone  HCl 8-2 MG FILM Dissolve 1 film under tongue 3 times daily. 03/24/21     citalopram  (CELEXA ) 40 MG tablet Take 40 mg by mouth daily.    [provider]  clonazePAM  (KLONOPIN ) 0.5 MG tablet Take 1 tablet (0.5 mg total) by mouth 2 (two) times daily as needed. 03/19/21     clonazePAM  (KLONOPIN ) 0.5 MG tablet Take 1 (one) Tablet by mouth two times daily, as needed, Call 7 days before you run out. 05/15/21     diltiazem  (CARDIZEM  SR) 120 MG 12 hr capsule Take 1 capsule (120 mg total) by mouth once for 1 dose. Take one hour prior to  Coronary CTA 09/03/19 09/03/19  O'Neal, Cathay Clonts, MD  esomeprazole  (NEXIUM ) 20 MG capsule Take 1 capsule (20 mg total) by mouth daily at 12 noon. 09/04/19   O'NealCathay Clonts, MD  ezetimibe (ZETIA) 10 MG tablet Take 10 mg by mouth daily. 10/07/23   [provider]  guaiFENesin  (MUCINEX ) 600 MG 12 hr tablet Take 1 tablet (600 mg total) by mouth 2 (two) times daily. 11/19/23   Corbin Dess, PA-C  levothyroxine  (SYNTHROID ) 175 MCG tablet Take 150 mcg by mouth daily before breakfast.    [provider]  lisinopril  (ZESTRIL ) 40 MG tablet Take 1 tablet (  40 mg total) by mouth daily. 09/03/19   O'Neal, Cathay Clonts, MD  nitroGLYCERIN  (NITROSTAT ) 0.4 MG SL tablet Place 1 tablet (0.4 mg total) under the tongue every 5 (five) minutes as needed for chest pain. 08/14/19 11/12/19  O'NealCathay Clonts, MD  omeprazole (PRILOSEC) 20 MG capsule Take 20 mg by mouth daily.    [provider]  ondansetron  (ZOFRAN -ODT) 4 MG disintegrating tablet Take 4 mg by mouth every 8 (eight) hours as needed for nausea or vomiting.    [provider]  Phenyleph-Doxylamine-DM-APAP (VICKS DAYQUIL/NYQUIL CLD & FLU PO) Take by mouth.    [provider]   predniSONE  (DELTASONE ) 20 MG tablet Take 2 tablets (40 mg total) by mouth daily with breakfast. 11/19/23   Corbin Dess, PA-C  promethazine  (PHENERGAN ) 25 MG tablet TAKE 1/2-1 TABLET BY MOUTH 3 TIMES DAILY AS NEEDED FOR NAUSEA OR VOMITING 11/30/20 11/30/21  Darci East, MD  rosuvastatin  (CRESTOR ) 40 MG tablet Take 1 tablet (40 mg total) by mouth daily. 08/14/19   O'NealCathay Clonts, MD  sertraline  (ZOLOFT ) 50 MG tablet Take 1 tablet by mouth daily 05/14/21     traZODone (DESYREL) 150 MG tablet Take 150 mg by mouth at bedtime.  12/15/15   [provider]      Allergies    Demerol [meperidine], Imitrex [sumatriptan], Penicillins, Sulfa antibiotics, Abilify [aripiprazole], Levaquin  [levofloxacin  in d5w], Neurontin [gabapentin], Toradol  [ketorolac  tromethamine ], Tramadol , Ceclor [cefaclor], and Doxycycline     Review of Systems   Review of Systems  Constitutional:  Negative for chills and fever.  HENT:  Negative for ear pain and sore throat.   Eyes:  Negative for pain and visual disturbance.  Respiratory:  Negative for cough and shortness of breath.   Cardiovascular:  Negative for chest pain and palpitations.  Gastrointestinal:  Positive for diarrhea, nausea and vomiting. Negative for abdominal pain.  Genitourinary:  Negative for dysuria and hematuria.  Musculoskeletal:  Negative for arthralgias and back pain.  Skin:  Negative for color change and rash.  Neurological:  Negative for seizures and syncope.  All other systems reviewed and are negative.   Physical Exam Updated Vital Signs BP (!) 149/79   Pulse 80   Temp 98.3 F (36.8 C) (Oral)   Resp 20   Ht 1.626 m (5\' 4" )   Wt 60 kg   SpO2 96%   BMI 22.71 kg/m  Physical Exam Vitals and nursing note reviewed.  Constitutional:      General: She is not in acute distress.    Appearance: Normal appearance. She is well-developed.  HENT:     Head: Normocephalic and atraumatic.     Mouth/Throat:     Mouth: Mucous  membranes are dry.  Eyes:     Extraocular Movements: Extraocular movements intact.     Conjunctiva/sclera: Conjunctivae normal.     Pupils: Pupils are equal, round, and reactive to light.  Cardiovascular:     Rate and Rhythm: Normal rate and regular rhythm.     Heart sounds: No murmur heard. Pulmonary:     Effort: Pulmonary effort is normal. No respiratory distress.     Breath sounds: Normal breath sounds.  Abdominal:     General: There is no distension.     Palpations: Abdomen is soft.     Tenderness: There is no abdominal tenderness.  Musculoskeletal:        General: No swelling.     Cervical back: Normal range of motion and neck supple.  Skin:  General: Skin is warm and dry.     Capillary Refill: Capillary refill takes less than 2 seconds.  Neurological:     General: No focal deficit present.     Mental Status: She is alert and oriented to person, place, and time.  Psychiatric:        Mood and Affect: Mood normal.     ED Results / Procedures / Treatments   Labs (all labs ordered are listed, but only abnormal results are displayed) Labs Reviewed  CBC  LIPASE, BLOOD  COMPREHENSIVE METABOLIC PANEL    EKG None  Radiology No results found.  Procedures Procedures    Medications Ordered in ED Medications  sodium chloride  0.9 % bolus 1,000 mL (has no administration in time range)  0.9 %  sodium chloride  infusion (has no administration in time range)  promethazine  (PHENERGAN ) 12.5 mg in sodium chloride  0.9 % 50 mL IVPB (has no administration in time range)    ED Course/ Medical Decision Making/ A&P                                 Medical Decision Making Amount and/or Complexity of Data Reviewed Labs: ordered.  Risk OTC drugs. Prescription drug management.  Patient clinically dehydrated.  But not tachycardic.  Patient is on Suboxone .  Do not think this is necessarily a withdrawal situation.  Other family members have good nausea vomiting and diarrhea so  is probably a gastroenteritis.  Will give patient 1 L of fluid bolus and then 100 cc an hour.  Have ordered basic labs which include CBC complete metabolic panel and lipase.  Patient states she does not respond to Zofran  so have ordered Phenergan .  Patient denies vomiting any blood or any blood in her bowel movements.  Patient feeling much better after liter of fluids.  And Phenergan .   Patient's labs very reassuring lipase normal at 39 complete metabolic panel normal renal function normal anion gap normal white count not elevated 7.5 hemoglobin 14.3 platelets 295.  Since other family members have had similar illness this is most likely a viral gastroenteritis.  Should be self-limiting.  Will treat patient at home with Zofran  ODT Phenergan  and Imodium  A-D.   Final Clinical Impression(s) / ED Diagnoses Final diagnoses:  Gastroenteritis    Rx / DC Orders ED Discharge Orders     None         Nicklas Barns, MD 11/22/23 1249    Nicklas Barns, MD 11/22/23 1434

## 2023-11-22 NOTE — ED Triage Notes (Signed)
 Pt arrived via POV from home c/o N/V/D since yesterday. Pt reports multiple relatives at home have similar symptoms.

## 2023-11-23 NOTE — ED Provider Notes (Signed)
[facility]    [name]: [id number] Arrival date & time: 11/19/23  1538      History   Chief Complaint Chief Complaint  Patient presents with   Cough    HPI [name] is a 63 y.o. female.   Patient presents with several day history of productive cough, weakness, fatigue, wheezing, chest tightness, mild shortness of breath on exertion.  Denies fever, chills, chest pain, abdominal pain, vomiting, diarrhea.  History of asthma and COPD, states compliant with inhaler regimen with minimal relief at this time.  Multiple sick contacts recently.  Past medical past medical history also significant for CHF, diabetes, fibromyalgia, hypertension, history of CVA.    Past Medical History:  Diagnosis Date   Anxiety    Asthma    Albuterol prn    CHF (congestive heart failure) (HCC)    takes Furosemide daily   Chronic back pain    DDD/scoliosis   Complication of anesthesia    hard to wake up   COPD (chronic obstructive pulmonary disease) (HCC)    DDD (degenerative disc disease)    Depression    takes Celexa,Clonazepam daily   Diabetes mellitus    borderline   Diverticulitis    Edema of lower extremity    Emphysema lung (HCC)    Fibromyalgia    Gastritis    GERD (gastroesophageal reflux disease)    takes Pepcid daily   Gout    Gout of big toe    RIGHT   Hemorrhoids    Hiatal hernia    History of bronchitis    last time 4-66months ago    History of colon polyps    History of kidney stones    is in her left kidney and has been for couple of yrs and no problems   HTN (hypertension)    takes lisinopril daily   Hyperlipidemia    but doesn't take any meds   Hypothyroidism    takes Synthroid daily   Insomnia    doesn't take any meds for this   Joint pain    Joint swelling    Migraine    last 04/04/18   Migraine    MS (multiple sclerosis) (HCC)    early stages   Muscle spasm    takes Flexeril daily as needed   Neuropathy    Osteoporosis    Phlebitis     Pneumonia    hx of;last time in 2014   PONV (postoperative nausea and vomiting)    Shortness of breath    with exertion or stressed    Sleep apnea    sleep study done at least 52yrs ago;has CPAP but doesn't use   Stroke (HCC)    left sided weakness   TIA (transient ischemic attack)    Hx: of   Urinary frequency     Patient Active Problem List   Diagnosis Date Noted   Baclofen overdose 05/17/2019   Hypothyroidism    Grade I diastolic dysfunction    AKI (acute kidney injury) (HCC)    Anxiety and depression    Abdominal pain, chronic, epigastric 02/05/2018   Nausea with vomiting 02/05/2018   Fibromyalgia 11/02/2017   Feeling of chest tightness 04/12/2015   SOB (shortness of breath) 04/12/2015   Hyperglycemia 04/08/2015   Tobacco use disorder 04/08/2015   Headache    Left-sided weakness    Hyperlipidemia 04/06/2015   Migraine 04/05/2015   Acute CVA (cerebrovascular accident) (HCC) 04/05/2015   Acute blood loss anemia  Hematochezia 11/15/2014   Bilateral lower abdominal cramping 11/15/2014   Rectal bleeding 11/15/2014   Colitis, acute 11/15/2014   Malnutrition of moderate degree (HCC) 04/17/2014   Postoperative anemia due to acute blood loss 04/17/2014    Class: Acute   Malnourished (HCC) 04/17/2014    Class: Present on Admission   Spinal stenosis, lumbar region, 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    Class: Chronic   Spondylolisthesis at L4-L5 level 04/14/2014   Spondylosis of cervical spine 12/30/2013   Cervical spondylosis without myelopathy 12/30/2013    Class: Chronic   Spondylolisthesis of lumbar region 12/30/2013    Class: Chronic   CAP (community acquired pneumonia) 08/24/2013   GERD (gastroesophageal reflux disease) 08/24/2013   COPD (chronic obstructive pulmonary disease) (HCC) 08/24/2013   Chronic pain syndrome 08/24/2013   Cigarette smoker 08/24/2013   Chest pain 08/24/2013   COPD exacerbation (HCC) 08/11/2012   DM (diabetes mellitus) (HCC)  08/11/2012   Leucocytosis 08/11/2012   Hypertension 08/11/2012    Past Surgical History:  Procedure Laterality Date   ABDOMINAL HYSTERECTOMY     ANTERIOR CERVICAL DECOMP/DISCECTOMY FUSION N/A 12/30/2013   Procedure: ANTERIOR CERVICAL DISCECTOMY FUSION C4-5, C5-6, plate and screws, allograft, local bone graft;  Surgeon: Kerrin Champagne, MD;  Location: [facility] OR;  Service: Orthopedics;  Laterality: N/A;   BACK SURGERY     lumbar discectomy   BIOPSY N/A 01/20/2015   Procedure: COLON BIOPSY;  Surgeon: West Bali, MD;  Location: [facility];  Service: Endoscopy;  Laterality: N/A;   BIOPSY  05/22/2018   Procedure: BIOPSY;  Surgeon: West Bali, MD;  Location: [facility];  Service: Endoscopy;;  gastric   BLADDER SUSPENSION     CARDIAC CATHETERIZATION     CARPAL TUNNEL RELEASE Right    CHOLECYSTECTOMY     COLONOSCOPY     COLONOSCOPY WITH PROPOFOL N/A 01/20/2015   SLF: 1. Normal ileum 2. Redundant left colon 3. Smalll internal hemorrhoids.    ESOPHAGOGASTRODUODENOSCOPY  10/29/07   normal   ESOPHAGOGASTRODUODENOSCOPY (EGD) WITH PROPOFOL N/A 05/22/2018   Procedure: ESOPHAGOGASTRODUODENOSCOPY (EGD) WITH PROPOFOL;  Surgeon: West Bali, MD;  Location: [facility];  Service: Endoscopy;  Laterality: N/A;  9:30am   fundic gland polyp     benign   POLYPECTOMY N/A 01/20/2015   Procedure: RECTAL POLYPECTOMY;  Surgeon: West Bali, MD;  Location: [facility];  Service: Endoscopy;  Laterality: N/A;   SIGMOIDOSCOPY  01/31/08   large internal hemorrhoids/small rectal polyp removed/rare sigmoid diverticula    OB History     Gravida  2   Para  2   Term  0   Preterm  2   AB      Living  2      SAB      IAB      Ectopic      Multiple      Live Births               Home Medications    Prior to Admission medications   Medication Sig Start Date End Date Taking? Authorizing Provider  ezetimibe (ZETIA) 10 MG tablet Take 10 mg by mouth daily. 10/07/23  Yes [provider]   guaiFENesin (MUCINEX) 600 MG 12 hr tablet Take 1 tablet (600 mg total) by mouth 2 (two) times daily. 11/19/23  Yes Particia Nearing, PA-C  omeprazole (PRILOSEC) 20 MG capsule Take 20 mg by mouth daily.   Yes [provider]  ondansetron (ZOFRAN-ODT) 4  MG disintegrating tablet Take 4 mg by mouth every 8 (eight) hours as needed for nausea or vomiting.   Yes [provider]  predniSONE (DELTASONE) 20 MG tablet Take 2 tablets (40 mg total) by mouth daily with breakfast. 11/19/23  Yes Particia Nearing, PA-C  albuterol (VENTOLIN HFA) 108 (90 Base) MCG/ACT inhaler Inhale 2 puffs into the lungs every 4 (four) hours as needed for wheezing or shortness of breath. 12/20/22   Valentino Nose, NP  AMITIZA 24 MCG capsule  08/13/19   [provider]  amLODipine (NORVASC) 10 MG tablet Take 1 tablet (10 mg total) by mouth daily. 09/03/19 12/02/19  O'NealRonnald Ramp, MD  azithromycin (ZITHROMAX) 250 MG tablet Take (2) tablets by mouth on day 1, then take (1) tablet by mouth on days 2-5. 11/19/23   Particia Nearing, PA-C  bismuth subsalicylate (PEPTO BISMOL) 262 MG chewable tablet Chew 524 mg by mouth as needed for indigestion or diarrhea or loose stools.    [provider]  Buprenorphine HCl-Naloxone HCl (SUBOXONE) 8-2 MG FILM Dissolve 1 film under the tongue three times daily 04/21/21     Buprenorphine HCl-Naloxone HCl 8-2 MG FILM  08/13/19   [provider]  Buprenorphine HCl-Naloxone HCl 8-2 MG FILM Dissolve 1 film under the tongue 3 (three) times daily. 02/24/21     Buprenorphine HCl-Naloxone HCl 8-2 MG FILM Dissolve 1 film under the tongue 3 times daily 02/24/21     Buprenorphine HCl-Naloxone HCl 8-2 MG FILM Dissolve 1 film under tongue 3 times daily. 03/24/21     citalopram (CELEXA) 40 MG tablet Take 40 mg by mouth daily.    [provider]  clonazePAM (KLONOPIN) 0.5 MG tablet Take 1 tablet (0.5 mg total) by mouth 2 (two) times daily as  needed. 03/19/21     clonazePAM (KLONOPIN) 0.5 MG tablet Take 1 (one) Tablet by mouth two times daily, as needed, Call 7 days before you run out. 05/15/21     diltiazem (CARDIZEM SR) 120 MG 12 hr capsule Take 1 capsule (120 mg total) by mouth once for 1 dose. Take one hour prior to  Coronary CTA 09/03/19 09/03/19  Sande Rives, MD  esomeprazole (NEXIUM) 20 MG capsule Take 1 capsule (20 mg total) by mouth daily at 12 noon. 09/04/19   O'NealRonnald Ramp, MD  levothyroxine (SYNTHROID) 175 MCG tablet Take 150 mcg by mouth daily before breakfast.    [provider]  lisinopril (ZESTRIL) 40 MG tablet Take 1 tablet (40 mg total) by mouth daily. 09/03/19   O'Neal, Ronnald Ramp, MD  loperamide (IMODIUM A-D) 2 MG tablet Take 1 tablet (2 mg total) by mouth 4 (four) times daily as needed for diarrhea or loose stools. 11/22/23   Vanetta Mulders, MD  nitroGLYCERIN (NITROSTAT) 0.4 MG SL tablet Place 1 tablet (0.4 mg total) under the tongue every 5 (five) minutes as needed for chest pain. 08/14/19 11/12/19  Sande Rives, MD  ondansetron (ZOFRAN-ODT) 4 MG disintegrating tablet Take 1 tablet (4 mg total) by mouth every 8 (eight) hours as needed for nausea or vomiting. 11/22/23   Vanetta Mulders, MD  Phenyleph-Doxylamine-DM-APAP (VICKS DAYQUIL/NYQUIL CLD & FLU PO) Take by mouth.    [provider]  promethazine (PHENERGAN) 25 MG tablet TAKE 1/2-1 TABLET BY MOUTH 3 TIMES DAILY AS NEEDED FOR NAUSEA OR VOMITING 11/30/20 11/30/21  Roselie Awkward, MD  promethazine (PHENERGAN) 25 MG tablet Take 1 tablet (25 mg total) by mouth every 6 (six) hours  as needed for nausea or vomiting. 11/22/23   Vanetta Mulders, MD  rosuvastatin (CRESTOR) 40 MG tablet Take 1 tablet (40 mg total) by mouth daily. 08/14/19   Sande Rives, MD  sertraline (ZOLOFT) 50 MG tablet Take 1 tablet by mouth daily 05/14/21     traZODone (DESYREL) 150 MG tablet Take 150 mg by mouth at bedtime.  12/15/15   [provider]    Family History Family History  Problem Relation Age of Onset   Diabetes Mother    Hypertension Mother    Arthritis Father    Heart failure Father    Diabetes Sister    Neuropathy Sister    Transient ischemic attack Sister    Heart disease Sister    Hypertension Sister    Cancer - Lung Other    Cancer - Prostate Other    Cancer - Other Other    Colon cancer Paternal Uncle    Colon cancer Brother        deceased age 28 from colon CA and brain CA    Social History Social History   Tobacco Use   Smoking status: Some Days    Current packs/day: 0.75    Average packs/day: 0.8 packs/day for 15.0 years (11.3 ttl pk-yrs)    Types: Cigarettes   Smokeless tobacco: Never  Vaping Use   Vaping status: Never Used  Substance Use Topics   Alcohol use: No   Drug use: No     Allergies   Demerol [meperidine], Imitrex [sumatriptan], Penicillins, Sulfa antibiotics, Abilify [aripiprazole], Levaquin [levofloxacin in d5w], Neurontin [gabapentin], Toradol [ketorolac tromethamine], Tramadol, Ceclor [cefaclor], and Doxycycline   Review of Systems Review of Systems Per HPI  Physical Exam Triage Vital Signs ED Triage Vitals  Encounter Vitals Group     BP 11/19/23 1543 (!) 181/67     Systolic BP Percentile --      Diastolic BP Percentile --      Pulse Rate 11/19/23 1543 83     Resp 11/19/23 1543 20     Temp 11/19/23 1543 98.5 F (36.9 C)     Temp Source 11/19/23 1543 Oral     SpO2 11/19/23 1543 93 %     Weight --      Height --      Head Circumference --      Peak Flow --      Pain Score 11/19/23 1544 5     Pain Loc --      Pain Education --      Exclude from Growth Chart --    No data found.  Updated Vital Signs BP (!) 181/67 (BP Location: Right Arm)   Pulse 83   Temp 98.5 F (36.9 C) (Oral)   Resp 20   SpO2 93%   Visual Acuity Right Eye Distance:   Left Eye Distance:   Bilateral Distance:    Right Eye Near:   Left Eye Near:    Bilateral Near:      Physical Exam Vitals and nursing note reviewed.  Constitutional:      Appearance: Normal appearance. She is not ill-appearing.  HENT:     Head: Atraumatic.     Nose: Nose normal.     Mouth/Throat:     Mouth: Mucous membranes are moist.  Eyes:     Extraocular Movements: Extraocular movements intact.     Conjunctiva/sclera: Conjunctivae normal.  Cardiovascular:     Rate and Rhythm: Normal rate and regular rhythm.  Heart sounds: Normal heart sounds.  Pulmonary:     Effort: Pulmonary effort is normal.     Breath sounds: Wheezing present.     Comments: Decreased breath sounds throughout Abdominal:     General: Bowel sounds are normal. There is no distension.     Palpations: Abdomen is soft.     Tenderness: There is no abdominal tenderness. There is no guarding.  Musculoskeletal:        General: Normal range of motion.     Cervical back: Normal range of motion and neck supple.  Skin:    General: Skin is warm and dry.  Neurological:     Mental Status: She is alert and oriented to person, place, and time.  Psychiatric:        Mood and Affect: Mood normal.        Thought Content: Thought content normal.        Judgment: Judgment normal.      UC Treatments / Results  Labs (all labs ordered are listed, but only abnormal results are displayed) Labs Reviewed - No data to display  EKG   Radiology No results found.  Procedures Procedures (including critical care time)  Medications Ordered in UC Medications  dexamethasone (DECADRON) injection 10 mg (10 mg Intramuscular Given 11/19/23 1556)    Initial Impression / Assessment and Plan / UC Course  I have reviewed the triage vital signs and the nursing notes.  Pertinent labs & imaging results that were available during my care of the patient were reviewed by me and considered in my medical decision making (see chart for details).     Significantly elevated blood pressure reading today in triage, discussed home  monitoring and compliance with blood pressure regimen.  Otherwise vital signs overall reassuring, oxygen saturation 93% on room air at rest, improved some with purposeful deep breathing like during exam.  Will treat for COPD exacerbation with IM Decadron, continued inhaler regimen, Zithromax, prednisone, Mucinex and follow-up for any worsening or unresolving symptoms.  Final Clinical Impressions(s) / UC Diagnoses   Final diagnoses:  COPD exacerbation (HCC)  Elevated blood pressure reading     Discharge Instructions      We given you a steroid shot today and I have prescribed a course of prednisone, antibiotics and Mucinex to help break up the congestion.  Use your rescue inhaler every 2-4 hours as needed.  Take big deep breaths to keep your lungs nice and inflated and you may even buy an incentive spirometer to help with this.  Go to the emergency department if worsening anytime    ED Prescriptions     Medication Sig Dispense Auth. Provider   azithromycin (ZITHROMAX) 250 MG tablet Take (2) tablets by mouth on day 1, then take (1) tablet by mouth on days 2-5. 6 tablet Particia Nearing, PA-C   predniSONE (DELTASONE) 20 MG tablet Take 2 tablets (40 mg total) by mouth daily with breakfast. 10 tablet Particia Nearing, PA-C   guaiFENesin (MUCINEX) 600 MG 12 hr tablet Take 1 tablet (600 mg total) by mouth 2 (two) times daily. 20 tablet Particia Nearing, [place]      PDMP not reviewed this encounter.   Particia Nearing, [place] 11/23/23 1208

## 2024-01-29 ENCOUNTER — Ambulatory Visit: Admission: EM | Admit: 2024-01-29 | Discharge: 2024-01-29 | Disposition: A | Discharge status: Home / Self Care

## 2024-01-29 DIAGNOSIS — J441 Chronic obstructive pulmonary disease with (acute) exacerbation: Secondary | ICD-10-CM

## 2024-01-29 MED ORDER — PREDNISONE 20 MG PO TABS: 40.0000 mg | ORAL_TABLET | Freq: Every day | ORAL | 0 refills | Status: AC

## 2024-01-29 MED ORDER — AZITHROMYCIN 250 MG PO TABS: ORAL_TABLET | ORAL | 0 refills | Status: DC

## 2024-01-29 MED ORDER — PROMETHAZINE-DM 6.25-15 MG/5ML PO SYRP: 5.0000 mL | ORAL_SOLUTION | Freq: Every evening | ORAL | 0 refills | Status: DC | PRN

## 2024-01-29 MED ORDER — GUAIFENESIN ER 600 MG PO TB12: 600.0000 mg | ORAL_TABLET | Freq: Two times a day (BID) | ORAL | 0 refills | Status: AC

## 2024-01-29 MED ORDER — ALBUTEROL SULFATE HFA 108 (90 BASE) MCG/ACT IN AERS: 2.0000 | INHALATION_SPRAY | RESPIRATORY_TRACT | 0 refills | Status: DC | PRN

## 2024-01-29 NOTE — ED Triage Notes (Signed)
 Cough, congestion, wheezing x 2 weeks. Taking nyquil.

## 2024-01-29 NOTE — Discharge Instructions (Signed)
 You are having an exacerbation of COPD most likely due to a infection.  Start using the albuterol inhaler every 4-6 hours scheduled for the next 2 days, then use as needed.  Continue Symbicort as prescribed.  Start taking the oral prednisone to help with lung inflammation.  In addition, recommend taking azithromycin to treat for atypical infection and guaifenesin to help with mucus.  Symptoms should improve over the next few days.  If symptoms worsen despite treatment, please return for reevaluation or follow-up in the ER.

## 2024-01-29 NOTE — ED Provider Notes (Signed)
 [facility]    [name]: [id number] Arrival date & time: 01/29/24  1554      History   Chief Complaint Chief Complaint  Patient presents with   Cough    HPI [name] is a 63 y.o. female.   Patient presents today with 2-week history of cough with congestion, chest soreness due to coughing, wheezing, and shortness of breath.  She reports for the past few days, has had worsening tactile fevers and chills.  She also endorses stuffy nose and is blowing thick gunk out of her head as well as a headache.  No abdominal pain, nausea/vomiting, or diarrhea.  No known sick contacts.  Has been using Symbicort and albuterol inhaler as prescribed for COPD without much improvement.  Has also been taking NyQuil and DayQuil for symptoms without much improvement.    Past Medical History:  Diagnosis Date   Anxiety    Asthma    Albuterol prn    CHF (congestive heart failure) (HCC)    takes Furosemide daily   Chronic back pain    DDD/scoliosis   Complication of anesthesia    hard to wake up   COPD (chronic obstructive pulmonary disease) (HCC)    DDD (degenerative disc disease)    Depression    takes Celexa,Clonazepam daily   Diabetes mellitus    borderline   Diverticulitis    Edema of lower extremity    Emphysema lung (HCC)    Fibromyalgia    Gastritis    GERD (gastroesophageal reflux disease)    takes Pepcid daily   Gout    Gout of big toe    RIGHT   Hemorrhoids    Hiatal hernia    History of bronchitis    last time 4-19months ago    History of colon polyps    History of kidney stones    is in her left kidney and has been for couple of yrs and no problems   HTN (hypertension)    takes lisinopril daily   Hyperlipidemia    but doesn't take any meds   Hypothyroidism    takes Synthroid daily   Insomnia    doesn't take any meds for this   Joint pain    Joint swelling    Migraine    last 04/04/18   Migraine    MS (multiple sclerosis) (HCC)    early stages    Muscle spasm    takes Flexeril daily as needed   Neuropathy    Osteoporosis    Phlebitis    Pneumonia    hx of;last time in 2014   PONV (postoperative nausea and vomiting)    Shortness of breath    with exertion or stressed    Sleep apnea    sleep study done at least 83yrs ago;has CPAP but doesn't use   Stroke [facility])    left sided weakness   TIA (transient ischemic attack)    Hx: of   Urinary frequency     Patient Active Problem List   Diagnosis Date Noted   Baclofen overdose 05/17/2019   Hypothyroidism    Grade I diastolic dysfunction    AKI (acute kidney injury) (HCC)    Anxiety and depression    Abdominal pain, chronic, epigastric 02/05/2018   Nausea with vomiting 02/05/2018   Fibromyalgia 11/02/2017   Feeling of chest tightness 04/12/2015   SOB (shortness of breath) 04/12/2015   Hyperglycemia 04/08/2015   Tobacco use disorder 04/08/2015   Headache  Left-sided weakness    Hyperlipidemia 04/06/2015   Migraine 04/05/2015   Acute CVA (cerebrovascular accident) (HCC) 04/05/2015   Acute blood loss anemia    Hematochezia 11/15/2014   Bilateral lower abdominal cramping 11/15/2014   Rectal bleeding 11/15/2014   Colitis, acute 11/15/2014   Malnutrition of moderate degree (HCC) 04/17/2014   Postoperative anemia due to acute blood loss 04/17/2014    Class: Acute   Malnourished (HCC) 04/17/2014    Class: Present on Admission   Spinal stenosis, lumbar region, 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    Class: Chronic   Spondylolisthesis at L4-L5 level 04/14/2014   Spondylosis of cervical spine 12/30/2013   Cervical spondylosis without myelopathy 12/30/2013    Class: Chronic   Spondylolisthesis of lumbar region 12/30/2013    Class: Chronic   CAP (community acquired pneumonia) 08/24/2013   GERD (gastroesophageal reflux disease) 08/24/2013   COPD (chronic obstructive pulmonary disease) (HCC) 08/24/2013   Chronic pain syndrome 08/24/2013   Cigarette smoker 08/24/2013    Chest pain 08/24/2013   COPD exacerbation (HCC) 08/11/2012   DM (diabetes mellitus) (HCC) 08/11/2012   Leucocytosis 08/11/2012   Hypertension 08/11/2012    Past Surgical History:  Procedure Laterality Date   ABDOMINAL HYSTERECTOMY     ANTERIOR CERVICAL DECOMP/DISCECTOMY FUSION N/A 12/30/2013   Procedure: ANTERIOR CERVICAL DISCECTOMY FUSION C4-5, C5-6, plate and screws, allograft, local bone graft;  Surgeon: Kerrin Champagne, MD;  Location: [facility] OR;  Service: Orthopedics;  Laterality: N/A;   BACK SURGERY     lumbar discectomy   BIOPSY N/A 01/20/2015   Procedure: COLON BIOPSY;  Surgeon: West Bali, MD;  Location: [facility] ORS;  Service: Endoscopy;  Laterality: N/A;   BIOPSY  05/22/2018   Procedure: BIOPSY;  Surgeon: West Bali, MD;  Location: [facility];  Service: Endoscopy;;  gastric   BLADDER SUSPENSION     CARDIAC CATHETERIZATION     CARPAL TUNNEL RELEASE Right    CHOLECYSTECTOMY     COLONOSCOPY     COLONOSCOPY WITH PROPOFOL N/A 01/20/2015   SLF: 1. Normal ileum 2. Redundant left colon 3. Smalll internal hemorrhoids.    ESOPHAGOGASTRODUODENOSCOPY  10/29/07   normal   ESOPHAGOGASTRODUODENOSCOPY (EGD) WITH PROPOFOL N/A 05/22/2018   Procedure: ESOPHAGOGASTRODUODENOSCOPY (EGD) WITH PROPOFOL;  Surgeon: West Bali, MD;  Location: [facility];  Service: Endoscopy;  Laterality: N/A;  9:30am   fundic gland polyp     benign   POLYPECTOMY N/A 01/20/2015   Procedure: RECTAL POLYPECTOMY;  Surgeon: West Bali, MD;  Location: [facility];  Service: Endoscopy;  Laterality: N/A;   SIGMOIDOSCOPY  01/31/08   large internal hemorrhoids/small rectal polyp removed/rare sigmoid diverticula    OB History     Gravida  2   Para  2   Term  0   Preterm  2   AB      Living  2      SAB      IAB      Ectopic      Multiple      Live Births               Home Medications    Prior to Admission medications   Medication Sig Start Date End Date Taking? Authorizing Provider   budesonide-formoterol (SYMBICORT) 160-4.5 MCG/ACT inhaler Inhale into the lungs. 06/19/20  Yes [provider]  Buprenorphine HCl-Naloxone HCl 8-2 MG FILM  08/13/19  Yes [provider]  citalopram (CELEXA) 40 MG tablet Take 40 mg  by mouth daily.   Yes [provider]  ezetimibe (ZETIA) 10 MG tablet Take 10 mg by mouth daily. 10/07/23  Yes [provider]  famotidine (PEPCID) 20 MG tablet Take by mouth. 04/08/15  Yes [provider]  levothyroxine (SYNTHROID) 175 MCG tablet Take 150 mcg by mouth daily before breakfast.   Yes [provider]  lisinopril (ZESTRIL) 40 MG tablet Take 1 tablet (40 mg total) by mouth daily. 09/03/19  Yes O'Neal, Ronnald Ramp, MD  montelukast (SINGULAIR) 10 MG tablet 1 tablet Orally Once a day   Yes [provider]  omeprazole (PRILOSEC) 20 MG capsule Take 20 mg by mouth daily.   Yes [provider]  promethazine-dextromethorphan (PROMETHAZINE-DM) 6.25-15 MG/5ML syrup Take 5 mLs by mouth at bedtime as needed. Do not take with alcohol or while driving or operating heavy machinery.  May cause drowsiness. 01/29/24  Yes Valentino Nose, NP  sertraline (ZOLOFT) 50 MG tablet Take 1 tablet by mouth daily 05/14/21  Yes   albuterol (VENTOLIN HFA) 108 (90 Base) MCG/ACT inhaler Inhale 2 puffs into the lungs every 4 (four) hours as needed for wheezing or shortness of breath. 01/29/24   Valentino Nose, NP  AMITIZA 24 MCG capsule  08/13/19   [provider]  amLODipine (NORVASC) 10 MG tablet Take 1 tablet (10 mg total) by mouth daily. 09/03/19 12/02/19  O'NealRonnald Ramp, MD  azithromycin (ZITHROMAX) 250 MG tablet Take (2) tablets by mouth on day 1, then take (1) tablet by mouth on days 2-5. 01/29/24   Valentino Nose, NP  bismuth subsalicylate (PEPTO BISMOL) 262 MG chewable tablet Chew 524 mg by mouth as needed for indigestion or diarrhea or loose stools.    [provider]  Buprenorphine  HCl-Naloxone HCl (SUBOXONE) 8-2 MG FILM Dissolve 1 film under the tongue three times daily 04/21/21     Buprenorphine HCl-Naloxone HCl 8-2 MG FILM Dissolve 1 film under the tongue 3 (three) times daily. 02/24/21     Buprenorphine HCl-Naloxone HCl 8-2 MG FILM Dissolve 1 film under the tongue 3 times daily 02/24/21     Buprenorphine HCl-Naloxone HCl 8-2 MG FILM Dissolve 1 film under tongue 3 times daily. 03/24/21     clonazePAM (KLONOPIN) 0.5 MG tablet Take 1 tablet (0.5 mg total) by mouth 2 (two) times daily as needed. 03/19/21     clonazePAM (KLONOPIN) 0.5 MG tablet Take 1 (one) Tablet by mouth two times daily, as needed, Call 7 days before you run out. 05/15/21     diltiazem (CARDIZEM SR) 120 MG 12 hr capsule Take 1 capsule (120 mg total) by mouth once for 1 dose. Take one hour prior to  Coronary CTA 09/03/19 09/03/19  Sande Rives, MD  esomeprazole (NEXIUM) 20 MG capsule Take 1 capsule (20 mg total) by mouth daily at 12 noon. 09/04/19   O'NealRonnald Ramp, MD  guaiFENesin (MUCINEX) 600 MG 12 hr tablet Take 1 tablet (600 mg total) by mouth 2 (two) times daily for 10 days. 01/29/24 02/08/24  Valentino Nose, NP  loperamide (IMODIUM A-D) 2 MG tablet Take 1 tablet (2 mg total) by mouth 4 (four) times daily as needed for diarrhea or loose stools. 11/22/23   Vanetta Mulders, MD  nitroGLYCERIN (NITROSTAT) 0.4 MG SL tablet Place 1 tablet (0.4 mg total) under the tongue every 5 (five) minutes as needed for chest pain. 08/14/19 11/12/19  Sande Rives, MD  ondansetron (ZOFRAN-ODT) 4 MG disintegrating tablet Take 4 mg  by mouth every 8 (eight) hours as needed for nausea or vomiting.    [provider]  ondansetron (ZOFRAN-ODT) 4 MG disintegrating tablet Take 1 tablet (4 mg total) by mouth every 8 (eight) hours as needed for nausea or vomiting. 11/22/23   Vanetta Mulders, MD  Phenyleph-Doxylamine-DM-APAP (VICKS DAYQUIL/NYQUIL CLD & FLU PO) Take by mouth.    [provider]  predniSONE  (DELTASONE) 20 MG tablet Take 2 tablets (40 mg total) by mouth daily with breakfast for 5 days. 01/29/24 02/03/24  Valentino Nose, NP  promethazine (PHENERGAN) 25 MG tablet TAKE 1/2-1 TABLET BY MOUTH 3 TIMES DAILY AS NEEDED FOR NAUSEA OR VOMITING 11/30/20 11/30/21  Roselie Awkward, MD  promethazine (PHENERGAN) 25 MG tablet Take 1 tablet (25 mg total) by mouth every 6 (six) hours as needed for nausea or vomiting. 11/22/23   Vanetta Mulders, MD  rosuvastatin (CRESTOR) 40 MG tablet Take 1 tablet (40 mg total) by mouth daily. 08/14/19   Sande Rives, MD  traZODone (DESYREL) 150 MG tablet Take 150 mg by mouth at bedtime.  12/15/15   [provider]    Family History Family History  Problem Relation Age of Onset   Diabetes Mother    Hypertension Mother    Arthritis Father    Heart failure Father    Diabetes Sister    Neuropathy Sister    Transient ischemic attack Sister    Heart disease Sister    Hypertension Sister    Cancer - Lung Other    Cancer - Prostate Other    Cancer - Other Other    Colon cancer Paternal Uncle    Colon cancer Brother        deceased age 51 from colon CA and brain CA    Social History Social History   Tobacco Use   Smoking status: Some Days    Current packs/day: 0.75    Average packs/day: 0.8 packs/day for 15.0 years (11.3 ttl pk-yrs)    Types: Cigarettes   Smokeless tobacco: Never  Vaping Use   Vaping status: Never Used  Substance Use Topics   Alcohol use: No   Drug use: No     Allergies   Demerol [meperidine], Imitrex [sumatriptan], Penicillins, Sulfa antibiotics, Abilify [aripiprazole], Levaquin [levofloxacin in d5w], Neurontin [gabapentin], Toradol [ketorolac tromethamine], Tramadol, Ceclor [cefaclor], and Doxycycline   Review of Systems Review of Systems Per HPI  Physical Exam Triage Vital Signs ED Triage Vitals  Encounter Vitals Group     BP 01/29/24 1645 127/66     Systolic BP Percentile --      Diastolic BP  Percentile --      Pulse Rate 01/29/24 1645 95     Resp 01/29/24 1645 18     Temp 01/29/24 1645 98.2 F (36.8 C)     Temp Source 01/29/24 1645 Oral     SpO2 01/29/24 1645 94 %     Weight --      Height --      Head Circumference --      Peak Flow --      Pain Score 01/29/24 1646 0     Pain Loc --      Pain Education --      Exclude from Growth Chart --    No data found.  Updated Vital Signs BP 127/66 (BP Location: Right Arm)   Pulse 95   Temp 98.2 F (36.8 C) (Oral)   Resp 18   SpO2 94%  Visual Acuity Right Eye Distance:   Left Eye Distance:   Bilateral Distance:    Right Eye Near:   Left Eye Near:    Bilateral Near:     Physical Exam Vitals and nursing note reviewed.  Constitutional:      General: She is not in acute distress.    Appearance: Normal appearance. She is not ill-appearing or toxic-appearing.  HENT:     Head: Normocephalic and atraumatic.     Right Ear: Tympanic membrane, ear canal and external ear normal.     Left Ear: Tympanic membrane, ear canal and external ear normal.     Nose: No congestion or rhinorrhea.     Mouth/Throat:     Mouth: Mucous membranes are moist.     Pharynx: Oropharynx is clear. Posterior oropharyngeal erythema present. No oropharyngeal exudate.  Eyes:     General: No scleral icterus.    Extraocular Movements: Extraocular movements intact.  Cardiovascular:     Rate and Rhythm: Normal rate and regular rhythm.  Pulmonary:     Effort: Pulmonary effort is normal. No respiratory distress.     Breath sounds: Wheezing present. No rhonchi or rales.  Musculoskeletal:     Cervical back: Normal range of motion and neck supple.  Lymphadenopathy:     Cervical: No cervical adenopathy.  Skin:    General: Skin is warm and dry.     Coloration: Skin is not jaundiced or pale.     Findings: No erythema or rash.  Neurological:     Mental Status: She is alert and oriented to person, place, and time.  Psychiatric:        Behavior:  Behavior is cooperative.      UC Treatments / Results  Labs (all labs ordered are listed, but only abnormal results are displayed) Labs Reviewed - No data to display  EKG   Radiology No results found.  Procedures Procedures (including critical care time)  Medications Ordered in UC Medications - No data to display  Initial Impression / Assessment and Plan / UC Course  I have reviewed the triage vital signs and the nursing notes.  Pertinent labs & imaging results that were available during my care of the patient were reviewed by me and considered in my medical decision making (see chart for details).   Patient is well-appearing, normotensive, afebrile, not tachycardic, not tachypneic, oxygenating well on room air.    1. COPD exacerbation (HCC) Vitals and exam are stable today Given these of symptoms, viral testing deferred Patient has multiple significant medication allergies including penicillins, sulfa, Levaquin, doxycycline, and cephalosporins.  She reports she tolerates azithromycin well.  Will treat COPD exacerbation with azithromycin, oral prednisone, continue albuterol inhaler and prescription given Also recommend Mucinex 600 mg twice daily to help with congestion Start cough suppressant medication Strict ER and return precautions discussed patient  The patient was given the opportunity to ask questions.  All questions answered to their satisfaction.  The patient is in agreement to this plan.    Final Clinical Impressions(s) / UC Diagnoses   Final diagnoses:  COPD exacerbation [facility])     Discharge Instructions      You are having an exacerbation of COPD most likely due to a infection.  Start using the albuterol inhaler every 4-6 hours scheduled for the next 2 days, then use as needed.  Continue Symbicort as prescribed.  Start taking the oral prednisone to help with lung inflammation.  In addition, recommend taking azithromycin to treat for  atypical infection and  guaifenesin to help with mucus.  Symptoms should improve over the next few days.  If symptoms worsen despite treatment, please return for reevaluation or follow-up in the ER.     ED Prescriptions     Medication Sig Dispense Auth. Provider   albuterol (VENTOLIN HFA) 108 (90 Base) MCG/ACT inhaler Inhale 2 puffs into the lungs every 4 (four) hours as needed for wheezing or shortness of breath. 18 g Cathlean Marseilles A, NP   azithromycin (ZITHROMAX) 250 MG tablet Take (2) tablets by mouth on day 1, then take (1) tablet by mouth on days 2-5. 6 tablet Cathlean Marseilles A, NP   guaiFENesin (MUCINEX) 600 MG 12 hr tablet Take 1 tablet (600 mg total) by mouth 2 (two) times daily for 10 days. 20 tablet Cathlean Marseilles A, NP   predniSONE (DELTASONE) 20 MG tablet Take 2 tablets (40 mg total) by mouth daily with breakfast for 5 days. 10 tablet Cathlean Marseilles A, NP   promethazine-dextromethorphan (PROMETHAZINE-DM) 6.25-15 MG/5ML syrup Take 5 mLs by mouth at bedtime as needed. Do not take with alcohol or while driving or operating heavy machinery.  May cause drowsiness. 118 mL Valentino Nose, NP      I have reviewed the PDMP during this encounter.   Valentino Nose, NP 01/29/24 1710

## 2024-02-13 ENCOUNTER — Encounter (HOSPITAL_COMMUNITY): Payer: Self-pay | Admitting: Family Medicine

## 2024-02-13 ENCOUNTER — Other Ambulatory Visit (HOSPITAL_COMMUNITY): Payer: Self-pay | Admitting: Family Medicine

## 2024-02-13 DIAGNOSIS — Z122 Encounter for screening for malignant neoplasm of respiratory organs: Secondary | ICD-10-CM

## 2024-02-13 DIAGNOSIS — Z87891 Personal history of nicotine dependence: Secondary | ICD-10-CM

## 2024-02-13 DIAGNOSIS — IMO0001 Reserved for inherently not codable concepts without codable children: Secondary | ICD-10-CM

## 2024-08-05 ENCOUNTER — Inpatient Hospital Stay (HOSPITAL_COMMUNITY)
Admission: EM | Admit: 2024-08-05 | Discharge: 2024-08-09 | DRG: 191 | Disposition: A | Discharge status: Home / Self Care | Payer: Self-pay | Attending: Family Medicine | Admitting: Family Medicine

## 2024-08-05 ENCOUNTER — Encounter (HOSPITAL_COMMUNITY): Payer: Self-pay

## 2024-08-05 ENCOUNTER — Emergency Department (HOSPITAL_COMMUNITY): Payer: Self-pay

## 2024-08-05 ENCOUNTER — Other Ambulatory Visit: Payer: Self-pay

## 2024-08-05 DIAGNOSIS — J449 Chronic obstructive pulmonary disease, unspecified: Secondary | ICD-10-CM

## 2024-08-05 DIAGNOSIS — E785 Hyperlipidemia, unspecified: Secondary | ICD-10-CM | POA: Diagnosis present

## 2024-08-05 DIAGNOSIS — F17210 Nicotine dependence, cigarettes, uncomplicated: Secondary | ICD-10-CM | POA: Diagnosis present

## 2024-08-05 DIAGNOSIS — Z886 Allergy status to analgesic agent status: Secondary | ICD-10-CM

## 2024-08-05 DIAGNOSIS — I110 Hypertensive heart disease with heart failure: Secondary | ICD-10-CM | POA: Diagnosis present

## 2024-08-05 DIAGNOSIS — M797 Fibromyalgia: Secondary | ICD-10-CM | POA: Diagnosis present

## 2024-08-05 DIAGNOSIS — J439 Emphysema, unspecified: Secondary | ICD-10-CM | POA: Diagnosis present

## 2024-08-05 DIAGNOSIS — Z808 Family history of malignant neoplasm of other organs or systems: Secondary | ICD-10-CM

## 2024-08-05 DIAGNOSIS — Z79890 Hormone replacement therapy (postmenopausal): Secondary | ICD-10-CM

## 2024-08-05 DIAGNOSIS — Z1152 Encounter for screening for COVID-19: Secondary | ICD-10-CM

## 2024-08-05 DIAGNOSIS — Z8261 Family history of arthritis: Secondary | ICD-10-CM

## 2024-08-05 DIAGNOSIS — Z8673 Personal history of transient ischemic attack (TIA), and cerebral infarction without residual deficits: Secondary | ICD-10-CM

## 2024-08-05 DIAGNOSIS — G35D Multiple sclerosis, unspecified: Secondary | ICD-10-CM | POA: Diagnosis present

## 2024-08-05 DIAGNOSIS — K219 Gastro-esophageal reflux disease without esophagitis: Secondary | ICD-10-CM | POA: Diagnosis present

## 2024-08-05 DIAGNOSIS — I5032 Chronic diastolic (congestive) heart failure: Secondary | ICD-10-CM | POA: Diagnosis present

## 2024-08-05 DIAGNOSIS — Z888 Allergy status to other drugs, medicaments and biological substances status: Secondary | ICD-10-CM

## 2024-08-05 DIAGNOSIS — E875 Hyperkalemia: Secondary | ICD-10-CM | POA: Diagnosis not present

## 2024-08-05 DIAGNOSIS — G894 Chronic pain syndrome: Secondary | ICD-10-CM | POA: Diagnosis present

## 2024-08-05 DIAGNOSIS — B369 Superficial mycosis, unspecified: Secondary | ICD-10-CM | POA: Diagnosis present

## 2024-08-05 DIAGNOSIS — J441 Chronic obstructive pulmonary disease with (acute) exacerbation: Secondary | ICD-10-CM | POA: Diagnosis not present

## 2024-08-05 DIAGNOSIS — Z881 Allergy status to other antibiotic agents status: Secondary | ICD-10-CM

## 2024-08-05 DIAGNOSIS — M810 Age-related osteoporosis without current pathological fracture: Secondary | ICD-10-CM | POA: Diagnosis present

## 2024-08-05 DIAGNOSIS — J45901 Unspecified asthma with (acute) exacerbation: Secondary | ICD-10-CM | POA: Diagnosis present

## 2024-08-05 DIAGNOSIS — Z7951 Long term (current) use of inhaled steroids: Secondary | ICD-10-CM

## 2024-08-05 DIAGNOSIS — Z833 Family history of diabetes mellitus: Secondary | ICD-10-CM

## 2024-08-05 DIAGNOSIS — Z880 Allergy status to penicillin: Secondary | ICD-10-CM

## 2024-08-05 DIAGNOSIS — Z90710 Acquired absence of both cervix and uterus: Secondary | ICD-10-CM

## 2024-08-05 DIAGNOSIS — Z885 Allergy status to narcotic agent status: Secondary | ICD-10-CM

## 2024-08-05 DIAGNOSIS — J479 Bronchiectasis, uncomplicated: Secondary | ICD-10-CM | POA: Diagnosis present

## 2024-08-05 DIAGNOSIS — Z882 Allergy status to sulfonamides status: Secondary | ICD-10-CM

## 2024-08-05 DIAGNOSIS — F419 Anxiety disorder, unspecified: Secondary | ICD-10-CM | POA: Diagnosis present

## 2024-08-05 DIAGNOSIS — D72829 Elevated white blood cell count, unspecified: Secondary | ICD-10-CM | POA: Diagnosis present

## 2024-08-05 DIAGNOSIS — Z79899 Other long term (current) drug therapy: Secondary | ICD-10-CM

## 2024-08-05 DIAGNOSIS — E876 Hypokalemia: Secondary | ICD-10-CM | POA: Diagnosis present

## 2024-08-05 DIAGNOSIS — T380X5A Adverse effect of glucocorticoids and synthetic analogues, initial encounter: Secondary | ICD-10-CM | POA: Diagnosis present

## 2024-08-05 DIAGNOSIS — R7303 Prediabetes: Secondary | ICD-10-CM | POA: Diagnosis present

## 2024-08-05 DIAGNOSIS — Z800 Family history of malignant neoplasm of digestive organs: Secondary | ICD-10-CM

## 2024-08-05 DIAGNOSIS — F32A Depression, unspecified: Secondary | ICD-10-CM | POA: Diagnosis present

## 2024-08-05 DIAGNOSIS — F17200 Nicotine dependence, unspecified, uncomplicated: Secondary | ICD-10-CM | POA: Diagnosis present

## 2024-08-05 DIAGNOSIS — E039 Hypothyroidism, unspecified: Secondary | ICD-10-CM | POA: Diagnosis present

## 2024-08-05 DIAGNOSIS — Z8249 Family history of ischemic heart disease and other diseases of the circulatory system: Secondary | ICD-10-CM

## 2024-08-05 LAB — CBC WITH DIFFERENTIAL/PLATELET
Abs Immature Granulocytes: 0.02 K/uL (ref 0.00–0.07)
Basophils Absolute: 0 K/uL (ref 0.0–0.1)
Basophils Relative: 1 %
Eosinophils Absolute: 0.3 K/uL (ref 0.0–0.5)
Eosinophils Relative: 4 %
HCT: 39.6 % (ref 36.0–46.0)
Hemoglobin: 13.3 g/dL (ref 12.0–15.0)
Immature Granulocytes: 0 %
Lymphocytes Relative: 41 %
Lymphs Abs: 2.9 K/uL (ref 0.7–4.0)
MCH: 29.9 pg (ref 26.0–34.0)
MCHC: 33.6 g/dL (ref 30.0–36.0)
MCV: 89 fL (ref 80.0–100.0)
Monocytes Absolute: 0.4 K/uL (ref 0.1–1.0)
Monocytes Relative: 6 %
Neutro Abs: 3.6 K/uL (ref 1.7–7.7)
Neutrophils Relative %: 48 %
Platelets: 278 K/uL (ref 150–400)
RBC: 4.45 MIL/uL (ref 3.87–5.11)
RDW: 13.8 % (ref 11.5–15.5)
WBC: 7.2 K/uL (ref 4.0–10.5)
nRBC: 0 % (ref 0.0–0.2)

## 2024-08-05 LAB — COMPREHENSIVE METABOLIC PANEL WITH GFR
ALT: 10 U/L (ref 0–44)
AST: 17 U/L (ref 15–41)
Albumin: 4 g/dL (ref 3.5–5.0)
Alkaline Phosphatase: 82 U/L (ref 38–126)
Anion gap: 11 (ref 5–15)
BUN: 14 mg/dL (ref 8–23)
CO2: 26 mmol/L (ref 22–32)
Calcium: 9 mg/dL (ref 8.9–10.3)
Chloride: 102 mmol/L (ref 98–111)
Creatinine, Ser: 1.01 mg/dL — ABNORMAL HIGH (ref 0.44–1.00)
GFR, Estimated: >60 mL/min (ref >60)
Glucose, Bld: 108 mg/dL — ABNORMAL HIGH (ref 70–99)
Potassium: 3.3 mmol/L — ABNORMAL LOW (ref 3.5–5.1)
Sodium: 139 mmol/L (ref 135–145)
Total Bilirubin: 0.7 mg/dL (ref 0.0–1.2)
Total Protein: 7.1 g/dL (ref 6.5–8.1)

## 2024-08-05 LAB — BRAIN NATRIURETIC PEPTIDE: B Natriuretic Peptide: 109 pg/mL — ABNORMAL HIGH (ref 0.0–100.0)

## 2024-08-05 LAB — BLOOD GAS, VENOUS
Acid-Base Excess: 0.7 mmol/L (ref 0.0–2.0)
Bicarbonate: 27.8 mmol/L (ref 20.0–28.0)
Drawn by: 71738
O2 Saturation: 33.3 %
Patient temperature: 37.2
pCO2, Ven: 55 mmHg (ref 44–60)
pH, Ven: 7.32 (ref 7.25–7.43)
pO2, Ven: <31 mmHg — CL (ref 32–45)

## 2024-08-05 LAB — MAGNESIUM: Magnesium: 1.9 mg/dL (ref 1.7–2.4)

## 2024-08-05 LAB — RESP PANEL BY RT-PCR (RSV, FLU A&B, COVID)  RVPGX2
Influenza A by PCR: NEGATIVE
Influenza B by PCR: NEGATIVE
Resp Syncytial Virus by PCR: NEGATIVE
SARS Coronavirus 2 by RT PCR: NEGATIVE

## 2024-08-05 MED ORDER — METHYLPREDNISOLONE SODIUM SUCC 125 MG IJ SOLR
125.0000 mg | Freq: Once | INTRAMUSCULAR | Status: AC
  Administered 2024-08-05: 125 mg via INTRAVENOUS
  Filled 2024-08-05: qty 2

## 2024-08-05 MED ORDER — ACETAMINOPHEN 500 MG PO TABS
1000.0000 mg | ORAL_TABLET | Freq: Once | ORAL | Status: AC
  Administered 2024-08-05: 1000 mg via ORAL
  Filled 2024-08-05: qty 2

## 2024-08-05 MED ORDER — IPRATROPIUM-ALBUTEROL 0.5-2.5 (3) MG/3ML IN SOLN
3.0000 mL | Freq: Once | RESPIRATORY_TRACT | Status: AC
  Administered 2024-08-05: 3 mL via RESPIRATORY_TRACT
  Filled 2024-08-05: qty 3

## 2024-08-05 MED ORDER — ALBUTEROL SULFATE (2.5 MG/3ML) 0.083% IN NEBU
2.5000 mg | INHALATION_SOLUTION | Freq: Once | RESPIRATORY_TRACT | Status: AC
Start: 2024-08-05 — End: 2024-08-06
  Administered 2024-08-06: 2.5 mg via RESPIRATORY_TRACT

## 2024-08-05 MED ORDER — SODIUM CHLORIDE 0.9 % IV SOLN
500.0000 mg | Freq: Once | INTRAVENOUS | Status: AC
  Administered 2024-08-05: 500 mg via INTRAVENOUS
  Filled 2024-08-05: qty 5

## 2024-08-05 MED ORDER — ONDANSETRON HCL 4 MG/2ML IJ SOLN
4.0000 mg | Freq: Once | INTRAMUSCULAR | Status: AC
  Administered 2024-08-05: 4 mg via INTRAVENOUS
  Filled 2024-08-05: qty 2

## 2024-08-05 NOTE — ED Triage Notes (Addendum)
 Pt bib EMS from home with c/o asthma, pt daughter was also bib EMS for the same, pt has cough x 1 week, pt was given 5 mg albuterol  in route, and both pt are smokers.

## 2024-08-05 NOTE — ED Provider Triage Note (Signed)
 Emergency Medicine Provider Triage Evaluation Note  [name] , a 63 y.o. female  was evaluated in triage.  Pt complains of feeling of breath that began this evening.  She reports she had some cough and congestion over the past week which seemed to have worsened.  She reports history of COPD and is concern for an exacerbation.  No chest pain.  Review of Systems  Positive: As above Negative: As above  Physical Exam  BP (!) 141/71 (BP Location: Left Arm)   Pulse (!) 101   Temp 99 F (37.2 C) (Oral)   Resp (!) 23   Ht 5' 4 (1.626 m)   Wt 60 kg   SpO2 96%   BMI 22.71 kg/m  Gen:   Awake, no distress   Resp:  Normal effort  MSK:   Moves extremities without difficulty  Other:  Wheezing in lungs bilaterally. Able to talk in full sentences on room air  Medical Decision Making  Medically screening exam initiated at 10:16 PM.  Appropriate orders placed.  [name] was informed that the remainder of the evaluation will be completed by another provider, this initial triage assessment does not replace that evaluation, and the importance of remaining in the ED until their evaluation is complete.     Veta Palma, PA-C 08/05/24 2344

## 2024-08-05 NOTE — ED Provider Notes (Signed)
 [place] EMERGENCY DEPARTMENT AT [facility] Provider Note   [name]: [id number] Arrival date & time: 08/05/24  2156     Patient presents with: Asthma   [name] is a 63 y.o. female.    Asthma Associated symptoms include shortness of breath.  Patient presents for shortness of breath.  Medical history includes DM, COPD, GERD, chronic pain, fibromyalgia, CHF, anxiety, depression.  She has had sick contacts at home.  She has had worsening URI symptoms over the past week, including chest tightness, cough.  Due to the worsening symptoms, EMS was called to her home today.  She arrives in the ED with her daughter who has had similar symptoms.  She was given 5 mg of albuterol  prior to arrival.    Prior to Admission medications   Medication Sig Start Date End Date Taking? Authorizing Provider  albuterol  (VENTOLIN  HFA) 108 (90 Base) MCG/ACT inhaler Inhale 2 puffs into the lungs every 4 (four) hours as needed for wheezing or shortness of breath. 01/29/24   Chandra Harlene LABOR, NP  AMITIZA 24 MCG capsule  08/13/19   [provider]  amLODipine  (NORVASC ) 10 MG tablet Take 1 tablet (10 mg total) by mouth daily. 09/03/19 12/02/19  O'NealDarryle Ned, MD  azithromycin  (ZITHROMAX ) 250 MG tablet Take (2) tablets by mouth on day 1, then take (1) tablet by mouth on days 2-5. 01/29/24   Chandra Harlene LABOR, NP  bismuth subsalicylate (PEPTO BISMOL) 262 MG chewable tablet Chew 524 mg by mouth as needed for indigestion or diarrhea or loose stools.    [provider]  budesonide-formoterol  (SYMBICORT) 160-4.5 MCG/ACT inhaler Inhale into the lungs. 06/19/20   [provider]  Buprenorphine  HCl-Naloxone  HCl (SUBOXONE ) 8-2 MG FILM Dissolve 1 film under the tongue three times daily 04/21/21     Buprenorphine  HCl-Naloxone  HCl 8-2 MG FILM  08/13/19   [provider]  Buprenorphine  HCl-Naloxone  HCl 8-2 MG FILM Dissolve 1 film under the tongue 3 (three) times daily.  02/24/21     Buprenorphine  HCl-Naloxone  HCl 8-2 MG FILM Dissolve 1 film under the tongue 3 times daily 02/24/21     Buprenorphine  HCl-Naloxone  HCl 8-2 MG FILM Dissolve 1 film under tongue 3 times daily. 03/24/21     citalopram  (CELEXA ) 40 MG tablet Take 40 mg by mouth daily.    [provider]  clonazePAM  (KLONOPIN ) 0.5 MG tablet Take 1 tablet (0.5 mg total) by mouth 2 (two) times daily as needed. 03/19/21     clonazePAM  (KLONOPIN ) 0.5 MG tablet Take 1 (one) Tablet by mouth two times daily, as needed, Call 7 days before you run out. 05/15/21     diltiazem  (CARDIZEM  SR) 120 MG 12 hr capsule Take 1 capsule (120 mg total) by mouth once for 1 dose. Take one hour prior to  Coronary CTA 09/03/19 09/03/19  Barbaraann Darryle Ned, MD  esomeprazole  (NEXIUM ) 20 MG capsule Take 1 capsule (20 mg total) by mouth daily at 12 noon. 09/04/19   O'NealDarryle Ned, MD  ezetimibe (ZETIA) 10 MG tablet Take 10 mg by mouth daily. 10/07/23   [provider]  famotidine  (PEPCID ) 20 MG tablet Take by mouth. 04/08/15   [provider]  levothyroxine  (SYNTHROID ) 175 MCG tablet Take 150 mcg by mouth daily before breakfast.    [provider]  lisinopril  (ZESTRIL ) 40 MG tablet Take 1 tablet (40 mg total) by mouth daily. 09/03/19   O'NealDarryle Ned, MD  loperamide  (IMODIUM  A-D) 2 MG tablet  Take 1 tablet (2 mg total) by mouth 4 (four) times daily as needed for diarrhea or loose stools. 11/22/23   Zackowski, Scott, MD  montelukast (SINGULAIR) 10 MG tablet 1 tablet Orally Once a day    [provider]  nitroGLYCERIN  (NITROSTAT ) 0.4 MG SL tablet Place 1 tablet (0.4 mg total) under the tongue every 5 (five) minutes as needed for chest pain. 08/14/19 11/12/19  O'NealDarryle Ned, MD  omeprazole (PRILOSEC) 20 MG capsule Take 20 mg by mouth daily.    [provider]  ondansetron  (ZOFRAN -ODT) 4 MG disintegrating tablet Take 4 mg by mouth every 8 (eight) hours as needed for nausea or  vomiting.    [provider]  ondansetron  (ZOFRAN -ODT) 4 MG disintegrating tablet Take 1 tablet (4 mg total) by mouth every 8 (eight) hours as needed for nausea or vomiting. 11/22/23   Zackowski, Scott, MD  Phenyleph-Doxylamine-DM-APAP (VICKS DAYQUIL/NYQUIL CLD & FLU PO) Take by mouth.    [provider]  promethazine  (PHENERGAN ) 25 MG tablet TAKE 1/2-1 TABLET BY MOUTH 3 TIMES DAILY AS NEEDED FOR NAUSEA OR VOMITING 11/30/20 11/30/21  Nanda Dunnings, MD  promethazine  (PHENERGAN ) 25 MG tablet Take 1 tablet (25 mg total) by mouth every 6 (six) hours as needed for nausea or vomiting. 11/22/23   Zackowski, Scott, MD  promethazine -dextromethorphan (PROMETHAZINE -DM) 6.25-15 MG/5ML syrup Take 5 mLs by mouth at bedtime as needed. Do not take with alcohol or while driving or operating heavy machinery.  May cause drowsiness. 01/29/24   Chandra Harlene LABOR, NP  rosuvastatin  (CRESTOR ) 40 MG tablet Take 1 tablet (40 mg total) by mouth daily. 08/14/19   O'NealDarryle Ned, MD  sertraline  (ZOLOFT ) 50 MG tablet Take 1 tablet by mouth daily 05/14/21     traZODone (DESYREL) 150 MG tablet Take 150 mg by mouth at bedtime.  12/15/15   [provider]    Allergies: Demerol [meperidine], Imitrex [sumatriptan], Penicillins, Sulfa antibiotics, Abilify [aripiprazole], Levaquin  [levofloxacin  in d5w], Neurontin [gabapentin], Toradol  [ketorolac  tromethamine ], Tramadol , Ceclor [cefaclor], and Doxycycline     Review of Systems  Respiratory:  Positive for cough, chest tightness and shortness of breath.   All other systems reviewed and are negative.   Updated Vital Signs BP 136/64   Pulse 70   Temp 99 F (37.2 C) (Oral)   Resp (!) 24   Ht 5' 4 (1.626 m)   Wt 60 kg   SpO2 92%   BMI 22.71 kg/m   Physical Exam Vitals and nursing note reviewed.  Constitutional:      General: She is not in acute distress.    Appearance: Normal appearance. She is well-developed. She is not ill-appearing,  toxic-appearing or diaphoretic.  HENT:     Head: Normocephalic and atraumatic.     Right Ear: External ear normal.     Left Ear: External ear normal.     Nose: Nose normal.  Eyes:     Extraocular Movements: Extraocular movements intact.     Conjunctiva/sclera: Conjunctivae normal.  Cardiovascular:     Rate and Rhythm: Regular rhythm. Tachycardia present.     Heart sounds: No murmur heard. Pulmonary:     Effort: Pulmonary effort is normal. Tachypnea present. No respiratory distress.     Breath sounds: Wheezing and rhonchi present.  Abdominal:     General: There is no distension.     Palpations: Abdomen is soft.     Tenderness: There is no abdominal tenderness.  Musculoskeletal:        General:  No swelling. Normal range of motion.     Cervical back: Normal range of motion and neck supple.  Skin:    General: Skin is warm and dry.     Coloration: Skin is not jaundiced or pale.  Neurological:     General: No focal deficit present.     Mental Status: She is alert and oriented to person, place, and time.  Psychiatric:        Mood and Affect: Mood normal.        Behavior: Behavior normal.     (all labs ordered are listed, but only abnormal results are displayed) Labs Reviewed  BLOOD GAS, VENOUS - Abnormal; Notable for the following components:      Result Value   pO2, Ven <31 (*)    All other components within normal limits  COMPREHENSIVE METABOLIC PANEL WITH GFR - Abnormal; Notable for the following components:   Potassium 3.3 (*)    Glucose, Bld 108 (*)    Creatinine, Ser 1.01 (*)    All other components within normal limits  RESP PANEL BY RT-PCR (RSV, FLU A&B, COVID)  [id number]  CBC WITH DIFFERENTIAL/PLATELET  MAGNESIUM   BRAIN NATRIURETIC PEPTIDE    EKG: EKG Interpretation Date/Time:  Monday August 05 2024 22:44:17 EDT Ventricular Rate:  74 PR Interval:  133 QRS Duration:  88 QT Interval:  433 QTC Calculation: 481 R Axis:   70  Text Interpretation: Sinus rhythm  Abnormal R-wave progression, early transition Borderline repolarization abnormality Confirmed by Melvenia Motto [id number]) on 08/05/2024 11:14:48 PM  Radiology: DG Chest 2 View Result Date: 08/05/2024 EXAM: 2 VIEW(S) XRAY OF THE CHEST 08/05/2024 10:30:00 PM COMPARISON: chest radiograph 08/16/2020 CLINICAL HISTORY: shortness of breath, cough. 1 wk: wheezing, body aches, chills, fever, runny nose, SHOB FINDINGS: LUNGS AND PLEURA: Moderate bronchial thickening. No focal pulmonary opacity. No pulmonary edema. No pleural effusion. No pneumothorax. HEART AND MEDIASTINUM: Unchanged heart size and mediastinal contours. BONES AND SOFT TISSUES: Broad-based scoliotic curvature of the thoracic spine. Cervical fusion hardware noted. Upper abdominal surgical clips noted. IMPRESSION: 1. Moderate bronchial thickening. Electronically signed by: Andrea Gasman MD 08/05/2024 10:49 PM EDT RP Workstation: [id number]     Procedures   Medications Ordered in the ED  ondansetron  (ZOFRAN ) injection 4 mg (has no administration in time range)  azithromycin  (ZITHROMAX ) 500 mg in sodium chloride  0.9 % 250 mL IVPB (has no administration in time range)  albuterol  (PROVENTIL ) (2.5 MG/3ML) 0.083% nebulizer solution 2.5 mg (has no administration in time range)  ipratropium-albuterol  (DUONEB) 0.5-2.5 (3) MG/3ML nebulizer solution 3 mL (3 mLs Nebulization Given 08/05/24 2249)  methylPREDNISolone  sodium succinate (SOLU-MEDROL ) 125 mg/2 mL injection 125 mg (125 mg Intravenous Given 08/05/24 2246)                                    Medical Decision Making Amount and/or Complexity of Data Reviewed Labs: ordered.  Risk Prescription drug management.   This patient presents to the ED for concern of shortness of breath, this involves an extensive number of treatment options, and is a complaint that carries with it a high risk of complications and morbidity.  The differential diagnosis includes reactive airway disease exacerbation, CHF,  anemia, acidosis, anxiety   Co morbidities / Chronic conditions that complicate the patient evaluation  DM, COPD, GERD, chronic pain, fibromyalgia, CHF, anxiety, depression   Additional history obtained:  Additional history obtained from EMR External records from  outside source obtained and reviewed including EMS   Lab Tests:  I Ordered, and personally interpreted labs.  The pertinent results include: Normal hemoglobin, no leukocytosis, remaining lab work pending at time of signout   Imaging Studies ordered:  I ordered imaging studies including chest x-ray I independently visualized and interpreted imaging which showed bronchial thickening I agree with the radiologist interpretation   Cardiac Monitoring: / EKG:  The patient was maintained on a cardiac monitor.  I personally viewed and interpreted the cardiac monitored which showed an underlying rhythm of: Sinus rhythm   Problem List / ED Course / Critical interventions / Medication management  Patient presenting for worsening shortness of breath over the past week.  She arrives in the ED via EMS with her daughter, who has had similar symptoms.  Patient reports cough productive of green sputum.  She was given 5 mg of albuterol  during transit.  Currently, patient has mildly increased work of breathing, tachypnea, and diffuse wheezing and rhonchi on lung auscultation.  Solu-Medrol  and additional breathing treatment ordered.  Given her increased feeding production, antibiotics were ordered.  Workup was initiated.  X-ray shows bronchial thickening.  On reassessment, patient has only mild improvement.  Additional albuterol  was ordered.  Care of patient was signed out to oncoming ED provider. I ordered medication including Solu-Medrol , DuoNeb, albuterol , azithromycin  for COPD exacerbation Reevaluation of the patient after these medicines showed that the patient improved I have reviewed the patients home medicines and have made  adjustments as needed  Social Determinants of Health:  Lives independently      Final diagnoses:  COPD exacerbation [facility])    ED Discharge Orders     None          Melvenia Motto, MD 08/05/24 2328

## 2024-08-06 DIAGNOSIS — J441 Chronic obstructive pulmonary disease with (acute) exacerbation: Secondary | ICD-10-CM

## 2024-08-06 LAB — PHOSPHORUS: Phosphorus: 2.8 mg/dL (ref 2.5–4.6)

## 2024-08-06 LAB — HIV ANTIBODY (ROUTINE TESTING W REFLEX): HIV Screen 4th Generation wRfx: NONREACTIVE

## 2024-08-06 MED ORDER — ALUM & MAG HYDROXIDE-SIMETH 200-200-20 MG/5ML PO SUSP
30.0000 mL | Freq: Four times a day (QID) | ORAL | Status: DC | PRN
  Administered 2024-08-06 – 2024-08-09 (×4): 30 mL via ORAL
  Filled 2024-08-06 (×5): qty 30

## 2024-08-06 MED ORDER — FAMOTIDINE 20 MG PO TABS
20.0000 mg | ORAL_TABLET | Freq: Two times a day (BID) | ORAL | Status: DC | PRN
  Administered 2024-08-06: 20 mg via ORAL
  Filled 2024-08-06: qty 1

## 2024-08-06 MED ORDER — ARFORMOTEROL TARTRATE 15 MCG/2ML IN NEBU
15.0000 ug | INHALATION_SOLUTION | Freq: Two times a day (BID) | RESPIRATORY_TRACT | Status: DC
  Administered 2024-08-06 – 2024-08-09 (×6): 15 ug via RESPIRATORY_TRACT
  Filled 2024-08-06 (×6): qty 2

## 2024-08-06 MED ORDER — NYSTATIN 100000 UNIT/GM EX POWD
Freq: Two times a day (BID) | CUTANEOUS | Status: DC
  Filled 2024-08-06 (×2): qty 15

## 2024-08-06 MED ORDER — NICOTINE 14 MG/24HR TD PT24
14.0000 mg | MEDICATED_PATCH | Freq: Every day | TRANSDERMAL | Status: DC
Start: 2024-08-06 — End: 2024-08-09
  Administered 2024-08-06 – 2024-08-09 (×4): 14 mg via TRANSDERMAL
  Filled 2024-08-06 (×4): qty 1

## 2024-08-06 MED ORDER — ACETAMINOPHEN 500 MG PO TABS
1000.0000 mg | ORAL_TABLET | Freq: Four times a day (QID) | ORAL | Status: DC | PRN
  Administered 2024-08-06 – 2024-08-07 (×2): 1000 mg via ORAL
  Filled 2024-08-06 (×2): qty 2

## 2024-08-06 MED ORDER — IPRATROPIUM-ALBUTEROL 0.5-2.5 (3) MG/3ML IN SOLN
3.0000 mL | Freq: Three times a day (TID) | RESPIRATORY_TRACT | Status: DC
  Administered 2024-08-07 – 2024-08-09 (×8): 3 mL via RESPIRATORY_TRACT
  Filled 2024-08-06 (×8): qty 3

## 2024-08-06 MED ORDER — SODIUM CHLORIDE 0.9 % IV SOLN: 250.0000 mL | INTRAVENOUS | Status: AC | PRN

## 2024-08-06 MED ORDER — BUTALBITAL-APAP-CAFFEINE 50-325-40 MG PO TABS
2.0000 | ORAL_TABLET | Freq: Once | ORAL | Status: AC
  Administered 2024-08-06: 2 via ORAL
  Filled 2024-08-06: qty 2

## 2024-08-06 MED ORDER — CITALOPRAM HYDROBROMIDE 20 MG PO TABS
40.0000 mg | ORAL_TABLET | Freq: Every day | ORAL | Status: DC
  Administered 2024-08-06 – 2024-08-09 (×4): 40 mg via ORAL
  Filled 2024-08-06 (×4): qty 2

## 2024-08-06 MED ORDER — AZITHROMYCIN 250 MG PO TABS
250.0000 mg | ORAL_TABLET | Freq: Every day | ORAL | Status: DC
  Administered 2024-08-07 – 2024-08-09 (×3): 250 mg via ORAL
  Filled 2024-08-06 (×3): qty 1

## 2024-08-06 MED ORDER — POLYETHYLENE GLYCOL 3350 17 G PO PACK: 17.0000 g | PACK | Freq: Every day | ORAL | Status: DC | PRN

## 2024-08-06 MED ORDER — BUDESONIDE 0.5 MG/2ML IN SUSP
0.5000 mg | Freq: Two times a day (BID) | RESPIRATORY_TRACT | Status: DC
  Administered 2024-08-06 – 2024-08-09 (×6): 0.5 mg via RESPIRATORY_TRACT
  Filled 2024-08-06 (×6): qty 2

## 2024-08-06 MED ORDER — METHYLPREDNISOLONE SODIUM SUCC 125 MG IJ SOLR
40.0000 mg | Freq: Two times a day (BID) | INTRAMUSCULAR | Status: DC
  Administered 2024-08-06 – 2024-08-08 (×4): 40 mg via INTRAVENOUS
  Filled 2024-08-06 (×4): qty 2

## 2024-08-06 MED ORDER — FAMOTIDINE 20 MG PO TABS
20.0000 mg | ORAL_TABLET | Freq: Every day | ORAL | Status: DC
Start: 2024-08-06 — End: 2024-08-09
  Administered 2024-08-06 – 2024-08-08 (×3): 20 mg via ORAL
  Filled 2024-08-06 (×3): qty 1

## 2024-08-06 MED ORDER — DM-GUAIFENESIN ER 30-600 MG PO TB12
1.0000 | ORAL_TABLET | Freq: Two times a day (BID) | ORAL | Status: DC
  Administered 2024-08-06 – 2024-08-09 (×7): 1 via ORAL
  Filled 2024-08-06 (×7): qty 1

## 2024-08-06 MED ORDER — PREDNISONE 20 MG PO TABS
40.0000 mg | ORAL_TABLET | Freq: Every day | ORAL | Status: DC
  Administered 2024-08-06: 40 mg via ORAL
  Filled 2024-08-06: qty 2

## 2024-08-06 MED ORDER — MONTELUKAST SODIUM 10 MG PO TABS
10.0000 mg | ORAL_TABLET | Freq: Every day | ORAL | Status: DC
  Administered 2024-08-06 – 2024-08-08 (×3): 10 mg via ORAL
  Filled 2024-08-06 (×3): qty 1

## 2024-08-06 MED ORDER — POTASSIUM CHLORIDE CRYS ER 20 MEQ PO TBCR
40.0000 meq | EXTENDED_RELEASE_TABLET | Freq: Once | ORAL | Status: AC
  Administered 2024-08-06: 40 meq via ORAL
  Filled 2024-08-06: qty 4

## 2024-08-06 MED ORDER — MELATONIN 3 MG PO TABS: 6.0000 mg | ORAL_TABLET | Freq: Every evening | ORAL | Status: DC | PRN

## 2024-08-06 MED ORDER — PANTOPRAZOLE SODIUM 40 MG PO TBEC
40.0000 mg | DELAYED_RELEASE_TABLET | Freq: Two times a day (BID) | ORAL | Status: DC
  Administered 2024-08-06 – 2024-08-09 (×7): 40 mg via ORAL
  Filled 2024-08-06 (×6): qty 1

## 2024-08-06 MED ORDER — ENSURE PLUS HIGH PROTEIN PO LIQD
237.0000 mL | Freq: Two times a day (BID) | ORAL | Status: DC
  Administered 2024-08-06 – 2024-08-07 (×2): 237 mL via ORAL

## 2024-08-06 MED ORDER — ONDANSETRON HCL 4 MG/2ML IJ SOLN
4.0000 mg | Freq: Four times a day (QID) | INTRAMUSCULAR | Status: DC | PRN
  Administered 2024-08-06 – 2024-08-09 (×9): 4 mg via INTRAVENOUS
  Filled 2024-08-06 (×9): qty 2

## 2024-08-06 MED ORDER — BUTALBITAL-APAP-CAFFEINE 50-325-40 MG PO TABS
1.0000 | ORAL_TABLET | Freq: Four times a day (QID) | ORAL | Status: DC | PRN
  Administered 2024-08-06 – 2024-08-09 (×6): 1 via ORAL
  Filled 2024-08-06 (×6): qty 1

## 2024-08-06 MED ORDER — ALBUTEROL SULFATE (2.5 MG/3ML) 0.083% IN NEBU
10.0000 mg/h | INHALATION_SOLUTION | Freq: Once | RESPIRATORY_TRACT | Status: AC
  Administered 2024-08-06: 10 mg/h via RESPIRATORY_TRACT
  Filled 2024-08-06: qty 12

## 2024-08-06 MED ORDER — BUPRENORPHINE HCL-NALOXONE HCL 8-2 MG SL SUBL
1.0000 | SUBLINGUAL_TABLET | Freq: Three times a day (TID) | SUBLINGUAL | Status: DC
  Administered 2024-08-06 – 2024-08-09 (×11): 1 via SUBLINGUAL
  Filled 2024-08-06 (×11): qty 1

## 2024-08-06 MED ORDER — SODIUM CHLORIDE 0.9% FLUSH
3.0000 mL | Freq: Two times a day (BID) | INTRAVENOUS | Status: DC
  Administered 2024-08-06 – 2024-08-09 (×7): 3 mL via INTRAVENOUS

## 2024-08-06 MED ORDER — IPRATROPIUM-ALBUTEROL 0.5-2.5 (3) MG/3ML IN SOLN
3.0000 mL | Freq: Four times a day (QID) | RESPIRATORY_TRACT | Status: DC
  Administered 2024-08-06 (×3): 3 mL via RESPIRATORY_TRACT
  Filled 2024-08-06 (×3): qty 3

## 2024-08-06 MED ORDER — ENOXAPARIN SODIUM 40 MG/0.4ML IJ SOSY: 40.0000 mg | PREFILLED_SYRINGE | INTRAMUSCULAR | Status: DC

## 2024-08-06 MED ORDER — NICOTINE POLACRILEX 2 MG MT GUM: 2.0000 mg | CHEWING_GUM | OROMUCOSAL | Status: DC | PRN

## 2024-08-06 MED ORDER — PRAMIPEXOLE DIHYDROCHLORIDE 1 MG PO TABS
0.5000 mg | ORAL_TABLET | ORAL | Status: DC
Start: 2024-08-12 — End: 2024-08-09

## 2024-08-06 MED ORDER — SODIUM CHLORIDE 0.9% FLUSH: 3.0000 mL | INTRAVENOUS | Status: DC | PRN

## 2024-08-06 MED ORDER — ALBUTEROL SULFATE (2.5 MG/3ML) 0.083% IN NEBU: 2.5000 mg | INHALATION_SOLUTION | RESPIRATORY_TRACT | Status: DC | PRN

## 2024-08-06 MED ORDER — LEVOTHYROXINE SODIUM 50 MCG PO TABS
150.0000 ug | ORAL_TABLET | Freq: Every morning | ORAL | Status: DC
  Administered 2024-08-06 – 2024-08-09 (×4): 150 ug via ORAL
  Filled 2024-08-06 (×4): qty 3

## 2024-08-06 NOTE — ED Provider Notes (Addendum)
 Patient signed out pending reassessment.  Continued to have wheezing on exam.  Mild tachypnea.  Not hypoxic.  Second DuoNeb was ordered.  On second recheck, patient continues to have diffuse wheezing in all lung fields.  Will admit for frequent DuoNebs.  Physical Exam  BP 121/63   Pulse 97   Temp 98.7 F (37.1 C) (Oral)   Resp (!) 22   Ht 1.626 m (5' 4)   Wt 60 kg   SpO2 98%   BMI 22.71 kg/m   Physical Exam Frail, chronically ill-appearing Wheezing in all lung fields Procedures  .Critical Care  Performed by: Bari Charmaine FALCON, MD Authorized by: Bari Charmaine FALCON, MD   Critical care provider statement:    Critical care time (minutes):  31   Critical care was necessary to treat or prevent imminent or life-threatening deterioration of the following conditions:  Respiratory failure   Critical care was time spent personally by me on the following activities:  Development of treatment plan with patient or surrogate, discussions with consultants, evaluation of patient's response to treatment, examination of patient, ordering and review of laboratory studies, ordering and review of radiographic studies, ordering and performing treatments and interventions, pulse oximetry, re-evaluation of patient's condition and review of old charts   ED Course / MDM   Clinical Course as of 08/06/24 0234  Tue Aug 06, 2024  0058 Patient satting 95% on room air.  Mildly tachypneic.  Continues to have expiratory wheeze and chest tightness. [CH]    Clinical Course User Index [CH] Betzaira Mentel, Charmaine FALCON, MD   Medical Decision Making Amount and/or Complexity of Data Reviewed Labs: ordered.  Risk OTC drugs. Prescription drug management. Decision regarding hospitalization.   COPD exacerbation [facility])         Beryle Zeitz, Charmaine FALCON, MD 08/06/24 [id number]    Bari Charmaine FALCON, MD 08/06/24 [phone]

## 2024-08-06 NOTE — Plan of Care (Signed)

## 2024-08-06 NOTE — H&P (Signed)
 History and Physical    Patient: [name] FMW:[id number] DOB: 02-04-1961 DOA: 08/05/2024 DOS: the patient was seen and examined on 08/06/2024 PCP: [facility]   Patient coming from: Home  Chief Complaint:  Chief Complaint  Patient presents with   Asthma   HPI: [name] is a 63 y.o. female with medical history significant of asthma/COPD, chronic diastolic heart failure, prediabetes, fibromyalgia, gastroesophageal reflux disease, tobacco abuse and medication noncompliance; who presented to the hospital secondary to worsening shortness of breath, nonproductive coughing spells and increased wheezing.  Patient reports symptom has been present for the last 2-3 days and worsening.  No associated fever, sick contacts, neurologic deficits, dysuria/hematuria, melena, hematochezia, abdominal pain or associated chest pain.  Workup in the ED demonstrating chest x-ray with moderate bronchial thickening/bronchitic changes; respiratory panel by PCR negative for COVID, influenza and RSV, no elevated WBCs and BNP 109.  Nebulizer management and his steroids were provided; TRH contacted to place patient in the hospital for further evaluation and management.  Review of Systems: As mentioned in the history of present illness. All other systems reviewed and are negative. Past Medical History:  Diagnosis Date   Anxiety    Asthma    Albuterol  prn    CHF (congestive heart failure) (HCC)    takes Furosemide  daily   Chronic back pain    DDD/scoliosis   Complication of anesthesia    hard to wake up   COPD (chronic obstructive pulmonary disease) (HCC)    DDD (degenerative disc disease)    Depression    takes Celexa ,Clonazepam  daily   Diabetes mellitus    borderline   Diverticulitis    Edema of lower extremity    Emphysema lung (HCC)    Fibromyalgia    Gastritis    GERD (gastroesophageal reflux disease)    takes Pepcid  daily   Gout    Gout of big toe    RIGHT    Hemorrhoids    Hiatal hernia    History of bronchitis    last time 4-91months ago    History of colon polyps    History of kidney stones    is in her left kidney and has been for couple of yrs and no problems   HTN (hypertension)    takes lisinopril  daily   Hyperlipidemia    but doesn't take any meds   Hypothyroidism    takes Synthroid  daily   Insomnia    doesn't take any meds for this   Joint pain    Joint swelling    Migraine    last 04/04/18   Migraine    MS (multiple sclerosis)    early stages   Muscle spasm    takes Flexeril  daily as needed   Neuropathy    Osteoporosis    Phlebitis    Pneumonia    hx of;last time in 2014   PONV (postoperative nausea and vomiting)    Shortness of breath    with exertion or stressed    Sleep apnea    sleep study done at least 56yrs ago;has CPAP but doesn't use   Stroke (HCC)    left sided weakness   TIA (transient ischemic attack)    Hx: of   Urinary frequency    Past Surgical History:  Procedure Laterality Date   ABDOMINAL HYSTERECTOMY     ANTERIOR CERVICAL DECOMP/DISCECTOMY FUSION N/A 12/30/2013   Procedure: ANTERIOR CERVICAL DISCECTOMY FUSION C4-5, C5-6, plate and screws, allograft, local bone graft;  Surgeon: Lynwood FORBES Better, MD;  Location: [facility] OR;  Service: Orthopedics;  Laterality: N/A;   BACK SURGERY     lumbar discectomy   BIOPSY N/A 01/20/2015   Procedure: COLON BIOPSY;  Surgeon: Margo LITTIE Haddock, MD;  Location: [facility] ORS;  Service: Endoscopy;  Laterality: N/A;   BIOPSY  05/22/2018   Procedure: BIOPSY;  Surgeon: Haddock Margo LITTIE, MD;  Location: [facility] ENDO SUITE;  Service: Endoscopy;;  gastric   BLADDER SUSPENSION     CARDIAC CATHETERIZATION     CARPAL TUNNEL RELEASE Right    CHOLECYSTECTOMY     COLONOSCOPY     COLONOSCOPY WITH PROPOFOL  N/A 01/20/2015   SLF: 1. Normal ileum 2. Redundant left colon 3. Smalll internal hemorrhoids.    ESOPHAGOGASTRODUODENOSCOPY  10/29/07   normal   ESOPHAGOGASTRODUODENOSCOPY (EGD) WITH PROPOFOL  N/A  05/22/2018   Procedure: ESOPHAGOGASTRODUODENOSCOPY (EGD) WITH PROPOFOL ;  Surgeon: Haddock Margo LITTIE, MD;  Location: [facility];  Service: Endoscopy;  Laterality: N/A;  9:30am   fundic gland polyp     benign   POLYPECTOMY N/A 01/20/2015   Procedure: RECTAL POLYPECTOMY;  Surgeon: Margo LITTIE Haddock, MD;  Location: [facility] ORS;  Service: Endoscopy;  Laterality: N/A;   SIGMOIDOSCOPY  01/31/08   large internal hemorrhoids/small rectal polyp removed/rare sigmoid diverticula   Social History:  reports that she has been smoking cigarettes. She has a 11.3 pack-year smoking history. She has never used smokeless tobacco. She reports that she does not drink alcohol and does not use drugs.  Allergies  Allergen Reactions   Demerol [Meperidine] Anaphylaxis and Swelling   Imitrex [Sumatriptan] Anaphylaxis   Penicillins Anaphylaxis and Swelling    Has patient had a PCN reaction causing immediate rash, facial/tongue/throat swelling, SOB or lightheadedness with hypotension: Yes Has patient had a PCN reaction causing severe rash involving mucus membranes or skin necrosis: No Has patient had a PCN reaction that required hospitalization Yes Has patient had a PCN reaction occurring within the last 10 years: No If all of the above answers are NO, then may proceed with Cephalosporin use.    Sulfa Antibiotics Anaphylaxis   Abilify [Aripiprazole] Nausea And Vomiting   Levaquin  [Levofloxacin  In D5w]     Abdominal pain   Neurontin [Gabapentin] Other (See Comments)    Abdominal pain   Toradol  [Ketorolac  Tromethamine ] Other (See Comments)    Feels like my whole body is on fire.   Tramadol  Other (See Comments)    Stomach upset    Ceclor [Cefaclor] Nausea Only   Doxycycline  Nausea And Vomiting         Family History  Problem Relation Age of Onset   Diabetes Mother    Hypertension Mother    Arthritis Father    Heart failure Father    Diabetes Sister    Neuropathy Sister    Transient ischemic attack Sister     Heart disease Sister    Hypertension Sister    Cancer - Lung Other    Cancer - Prostate Other    Cancer - Other Other    Colon cancer Paternal Uncle    Colon cancer Brother        deceased age 82 from colon CA and brain CA    Prior to Admission medications   Medication Sig Start Date End Date Taking? Authorizing Provider  albuterol  (VENTOLIN  HFA) 108 (90 Base) MCG/ACT inhaler Inhale 2 puffs into the lungs every 4 (four) hours as needed for wheezing or shortness of breath. 01/29/24  Yes Chandra Raisin  A, NP  bismuth subsalicylate (PEPTO BISMOL) 262 MG chewable tablet Chew 524 mg by mouth as needed for indigestion or diarrhea or loose stools.   Yes [provider]  budesonide-formoterol  (SYMBICORT) 80-4.5 MCG/ACT inhaler Inhale 2 puffs into the lungs 2 (two) times daily. 05/19/24  Yes [provider]  Buprenorphine  HCl-Naloxone  HCl 8-2 MG FILM Dissolve 1 film under the tongue 3 (three) times daily. 02/24/21  Yes   Buprenorphine  HCl-Naloxone  HCl 8-2 MG FILM Dissolve 1 film under tongue 3 times daily. 03/24/21  Yes   citalopram  (CELEXA ) 40 MG tablet Take 40 mg by mouth daily.   Yes [provider]  fenofibrate micronized (LOFIBRA) 134 MG capsule Take 134 mg by mouth daily before breakfast. 05/19/24  Yes [provider]  ibuprofen  (ADVIL ) 800 MG tablet Take 800 mg by mouth every 6 (six) hours as needed for mild pain (pain score 1-3). 12/15/15  Yes [provider]  levothyroxine  (SYNTHROID ) 150 MCG tablet Take 150 mcg by mouth every morning.   Yes [provider]  loperamide  (IMODIUM  A-D) 2 MG tablet Take 1 tablet (2 mg total) by mouth 4 (four) times daily as needed for diarrhea or loose stools. 11/22/23  Yes Zackowski, Scott, MD  montelukast (SINGULAIR) 10 MG tablet 1 tablet Orally Once a day   Yes [provider]  naloxone  (NARCAN ) nasal spray 4 mg/0.1 mL Place 1 spray into the nose once. 05/16/24  Yes [provider]  omeprazole  (PRILOSEC) 20 MG capsule Take 20 mg by mouth daily.   Yes [provider]  ondansetron  (ZOFRAN -ODT) 4 MG disintegrating tablet Take 4 mg by mouth every 8 (eight) hours as needed for nausea or vomiting.   Yes [provider]  ondansetron  (ZOFRAN -ODT) 4 MG disintegrating tablet Take 1 tablet (4 mg total) by mouth every 8 (eight) hours as needed for nausea or vomiting. 11/22/23  Yes Zackowski, Scott, MD  Phenyleph-Doxylamine-DM-APAP (VICKS DAYQUIL/NYQUIL CLD & FLU PO) Take by mouth.   Yes [provider]  pramipexole (MIRAPEX) 0.5 MG tablet Take 0.5 mg by mouth once a week. Friday 05/19/24  Yes [provider]  promethazine  (PHENERGAN ) 25 MG tablet Take 1 tablet (25 mg total) by mouth every 6 (six) hours as needed for nausea or vomiting. 11/22/23  Yes Zackowski, Scott, MD    Physical Exam: Vitals:   08/06/24 0750 08/06/24 0800 08/06/24 0910 08/06/24 0911  BP:   (!) 128/56 (!) 128/56  Pulse:   71 76  Resp:  20 20 20   Temp:   97.9 F (36.6 C) 97.9 F (36.6 C)  TempSrc:   Oral Oral  SpO2: 97%  96% 96%  Weight:      Height:       General exam: Alert, awake, oriented x 3; demonstrated difficulty speaking in full sentence; appreciated tachypnea and bilateral expiratory wheezing. Respiratory system: No using accessory muscles; positive wheezing bilaterally.  Reporting intercostal muscle discomfort from coughing spells. Cardiovascular system:RRR.  No rubs, no gallops, no JVD. Gastrointestinal system: Abdomen is nondistended, soft and nontender. No organomegaly or masses felt. Normal bowel sounds heard. Central nervous system: No focal neurological deficits. Extremities: No cyanosis or clubbing. Skin: No petechiae. Some fungal dermatitis irritation appreciated on patient's skin folds. Psychiatry: Judgement and insight appear normal. Mood & affect appropriate.   Data Reviewed: BNP 109 CBC: WBC 7.2, hemoglobin 13.3 and platelet count 278K Comprehensive metabolic  panel: Sodium 139, potassium 3.3, chloride 102, bicarb 26, BUN 14, creatinine 1.01; normal LFTs  and GFR >60  Assessment and Plan: 1-acute COPD/asthma exacerbation and bronchiectasis - Started on oral antibiotics, IV steroids, Pulmicort/Brovana, mucolytic's, flutter valve and PPI twice a day. - Currently no requiring oxygen  supplementation - Follow clinical response and continue supportive care. - Will also continue nightly Singulair.  2-tobacco abuse - Cessation counseling provided - Continue nicotine replacement therapy. - Patient was receptive and will try to quit  3-fungal dermatitis - Nystatin topical will be provided.  4-chronic diastolic heart failure - Reports using as needed Lasix  as an outpatient - Currently compensated and without signs of fluid overload - Continue to follow daily weights and strict intake and output  5-GERD - Continue PPI and Pepcid  nightly  6-hypothyroidism - Continue Synthroid   7-history of depression/anxiety - Continue citalopram   8-fibromyalgia/chronic pain syndrome - Continue management with Suboxone  - Continue patient follow-up with pain clinic.  9-hypokalemia - Follow electrolytes and replete as needed - Will check magnesium  level.    Advance Care Planning:   Code Status: Full Code   Consults: None  Family Communication: No family at bedside.  Severity of Illness: The appropriate patient status for this patient is OBSERVATION. Observation status is judged to be reasonable and necessary in order to provide the required intensity of service to ensure the patient's safety. The patient's presenting symptoms, physical exam findings, and initial radiographic and laboratory data in the context of their medical condition is felt to place them at decreased risk for further clinical deterioration. Furthermore, it is anticipated that the patient will be medically stable for discharge from the hospital within 2 midnights of admission.    Author: Eric Nunnery, MD 08/06/2024 9:30 AM  For on call review [email or web address].

## 2024-08-07 ENCOUNTER — Observation Stay (HOSPITAL_COMMUNITY)

## 2024-08-07 DIAGNOSIS — E785 Hyperlipidemia, unspecified: Secondary | ICD-10-CM | POA: Diagnosis present

## 2024-08-07 DIAGNOSIS — Z7951 Long term (current) use of inhaled steroids: Secondary | ICD-10-CM | POA: Diagnosis not present

## 2024-08-07 DIAGNOSIS — F32A Depression, unspecified: Secondary | ICD-10-CM | POA: Diagnosis present

## 2024-08-07 DIAGNOSIS — E039 Hypothyroidism, unspecified: Secondary | ICD-10-CM | POA: Diagnosis present

## 2024-08-07 DIAGNOSIS — Z79890 Hormone replacement therapy (postmenopausal): Secondary | ICD-10-CM | POA: Diagnosis not present

## 2024-08-07 DIAGNOSIS — I110 Hypertensive heart disease with heart failure: Secondary | ICD-10-CM | POA: Diagnosis present

## 2024-08-07 DIAGNOSIS — Z8249 Family history of ischemic heart disease and other diseases of the circulatory system: Secondary | ICD-10-CM | POA: Diagnosis not present

## 2024-08-07 DIAGNOSIS — Z8673 Personal history of transient ischemic attack (TIA), and cerebral infarction without residual deficits: Secondary | ICD-10-CM | POA: Diagnosis not present

## 2024-08-07 DIAGNOSIS — J45901 Unspecified asthma with (acute) exacerbation: Secondary | ICD-10-CM | POA: Diagnosis present

## 2024-08-07 DIAGNOSIS — M797 Fibromyalgia: Secondary | ICD-10-CM | POA: Diagnosis present

## 2024-08-07 DIAGNOSIS — G894 Chronic pain syndrome: Secondary | ICD-10-CM | POA: Diagnosis present

## 2024-08-07 DIAGNOSIS — Z833 Family history of diabetes mellitus: Secondary | ICD-10-CM | POA: Diagnosis not present

## 2024-08-07 DIAGNOSIS — I5032 Chronic diastolic (congestive) heart failure: Secondary | ICD-10-CM | POA: Diagnosis present

## 2024-08-07 DIAGNOSIS — K219 Gastro-esophageal reflux disease without esophagitis: Secondary | ICD-10-CM | POA: Diagnosis present

## 2024-08-07 DIAGNOSIS — J441 Chronic obstructive pulmonary disease with (acute) exacerbation: Secondary | ICD-10-CM | POA: Diagnosis present

## 2024-08-07 DIAGNOSIS — Z1152 Encounter for screening for COVID-19: Secondary | ICD-10-CM | POA: Diagnosis not present

## 2024-08-07 DIAGNOSIS — E875 Hyperkalemia: Secondary | ICD-10-CM | POA: Diagnosis not present

## 2024-08-07 DIAGNOSIS — J479 Bronchiectasis, uncomplicated: Secondary | ICD-10-CM | POA: Diagnosis present

## 2024-08-07 DIAGNOSIS — F17210 Nicotine dependence, cigarettes, uncomplicated: Secondary | ICD-10-CM | POA: Diagnosis present

## 2024-08-07 DIAGNOSIS — E876 Hypokalemia: Secondary | ICD-10-CM | POA: Diagnosis present

## 2024-08-07 DIAGNOSIS — J439 Emphysema, unspecified: Secondary | ICD-10-CM | POA: Diagnosis present

## 2024-08-07 DIAGNOSIS — D72829 Elevated white blood cell count, unspecified: Secondary | ICD-10-CM | POA: Diagnosis present

## 2024-08-07 DIAGNOSIS — M810 Age-related osteoporosis without current pathological fracture: Secondary | ICD-10-CM | POA: Diagnosis present

## 2024-08-07 DIAGNOSIS — B369 Superficial mycosis, unspecified: Secondary | ICD-10-CM | POA: Diagnosis present

## 2024-08-07 LAB — CBC
HCT: 34.3 % — ABNORMAL LOW (ref 36.0–46.0)
Hemoglobin: 11.2 g/dL — ABNORMAL LOW (ref 12.0–15.0)
MCH: 29.5 pg (ref 26.0–34.0)
MCHC: 32.7 g/dL (ref 30.0–36.0)
MCV: 90.3 fL (ref 80.0–100.0)
Platelets: 243 K/uL (ref 150–400)
RBC: 3.8 MIL/uL — ABNORMAL LOW (ref 3.87–5.11)
RDW: 14.1 % (ref 11.5–15.5)
WBC: 16 K/uL — ABNORMAL HIGH (ref 4.0–10.5)
nRBC: 0 % (ref 0.0–0.2)

## 2024-08-07 LAB — BASIC METABOLIC PANEL WITH GFR
Anion gap: 11 (ref 5–15)
BUN: 19 mg/dL (ref 8–23)
CO2: 24 mmol/L (ref 22–32)
Calcium: 10.4 mg/dL — ABNORMAL HIGH (ref 8.9–10.3)
Chloride: 105 mmol/L (ref 98–111)
Creatinine, Ser: 0.94 mg/dL (ref 0.44–1.00)
GFR, Estimated: >60 mL/min (ref >60)
Glucose, Bld: 177 mg/dL — ABNORMAL HIGH (ref 70–99)
Potassium: 5.2 mmol/L — ABNORMAL HIGH (ref 3.5–5.1)
Sodium: 139 mmol/L (ref 135–145)

## 2024-08-07 MED ORDER — ADULT MULTIVITAMIN W/MINERALS CH
1.0000 | ORAL_TABLET | Freq: Every day | ORAL | Status: DC
  Administered 2024-08-07 – 2024-08-09 (×3): 1 via ORAL
  Filled 2024-08-07 (×4): qty 1

## 2024-08-07 MED ORDER — ENOXAPARIN SODIUM 40 MG/0.4ML IJ SOSY
40.0000 mg | PREFILLED_SYRINGE | INTRAMUSCULAR | Status: DC
  Administered 2024-08-07 – 2024-08-09 (×3): 40 mg via SUBCUTANEOUS
  Filled 2024-08-07 (×3): qty 0.4

## 2024-08-07 MED ORDER — LORATADINE 10 MG PO TABS
10.0000 mg | ORAL_TABLET | Freq: Every day | ORAL | Status: DC
  Administered 2024-08-07 – 2024-08-09 (×3): 10 mg via ORAL
  Filled 2024-08-07 (×3): qty 1

## 2024-08-07 MED ORDER — ENSURE PLUS HIGH PROTEIN PO LIQD
237.0000 mL | Freq: Three times a day (TID) | ORAL | Status: DC
  Administered 2024-08-07 – 2024-08-09 (×5): 237 mL via ORAL

## 2024-08-07 NOTE — Plan of Care (Signed)

## 2024-08-07 NOTE — TOC CM/SW Note (Signed)
 Transition of Care [facility]) - Inpatient Brief Assessment   Patient Details  Name: [name] MRN: [id number] Date of Birth: 1961/10/10  [facility] CM/SW Contact:    Lucie Lunger, LCSWA Phone Number: 08/07/2024, 9:31 AM   Clinical Narrative: Transition of Care Department [facility]) has reviewed patient and no TOC needs have been identified at this time. We will continue to monitor patient advancement through interdiciplinary progression rounds. If new patient transition needs arise, please place a TOC consult.  Transition of Care Asessment: Insurance and Status: Insurance coverage has been reviewed Patient has primary care physician: Yes Home environment has been reviewed: From home Prior level of function:: Independent Prior/Current Home Services: No current home services Social Drivers of Health Review: SDOH reviewed no interventions necessary Readmission risk has been reviewed: Yes Transition of care needs: no transition of care needs at this time

## 2024-08-07 NOTE — Progress Notes (Signed)
 Initial Nutrition Assessment  DOCUMENTATION CODES:   Not applicable  INTERVENTION:   -MVI with minerals daily -Ensure Plus High Protein po TID, each supplement provides 350 kcal and 20 grams of protein  -Continue regular diet  NUTRITION DIAGNOSIS:   Increased nutrient needs related to chronic illness (COPD) as evidenced by estimated needs.  GOAL:   Patient will meet greater than or equal to 90% of their needs  MONITOR:   PO intake, Supplement acceptance  REASON FOR ASSESSMENT:   Malnutrition Screening Tool    ASSESSMENT:   Pt with medical history significant of asthma/COPD, chronic diastolic heart failure, prediabetes, fibromyalgia, gastroesophageal reflux disease, tobacco abuse and medication noncompliance; who presented to the hospital secondary to worsening shortness of breath, nonproductive coughing spells and increased wheezing.  Pt admitted with COPD exacerbation.   Reviewed I/O's: +240 ml x 24 hours  Pt unavailable at time of visit. Attempted to speak with pt via call to hospital room phone, however, unable to reach. RD unable to obtain further nutrition-related history or complete nutrition-focused physical exam at this time.    Case dicussed in interdisciplinary rounds.   Case discussed with RN, who reports fair appetite. Noted meal completions 50%.   Reviewed wt hx; pt has experienced a 3.5% wt loss over the past 8 months, which is not significant for time frame.   Medications reviewed and include suboxone , lovenox , protonix , and solu-medrol .   Labs reviewed: K: 5.2.    Diet Order:   Diet Order             Diet regular Room service appropriate? Yes; Fluid consistency: Thin  Diet effective now                   EDUCATION NEEDS:   No education needs have been identified at this time  Skin:  Skin Assessment: Reviewed RN Assessment  Last BM:  08/04/24  Height:   Ht Readings from Last 1 Encounters:  08/05/24 5' 4 (1.626 m)    Weight:    Wt Readings from Last 1 Encounters:  08/06/24 57.9 kg    Ideal Body Weight:  54.5 kg  BMI:  Body mass index is 21.91 kg/m.  Estimated Nutritional Needs:   Kcal:  1700-1900  Protein:  90-105 grams  Fluid:  1.7-1.9 L    Margery ORN, RD, LDN, CDCES Registered Dietitian III Certified Diabetes Care and Education Specialist If unable to reach this RD, please use RD Inpatient group chat on secure chat between hours of 8am-4 pm daily

## 2024-08-07 NOTE — Progress Notes (Signed)
 [facility] PROGRESS NOTE   [name] FMW:[id number] DOB: 09-Aug-1961 DOA: 08/05/2024  PCP: [facility]  Brief History: 63 y.o. female with medical history significant of asthma/COPD, chronic diastolic heart failure, prediabetes, fibromyalgia, gastroesophageal reflux disease, tobacco abuse and medication noncompliance; who presented to the hospital secondary to worsening shortness of breath, nonproductive coughing spells and increased wheezing.  Patient was noted to have COPD exacerbation and she was hospitalized for further management.    Consultants: None  Procedures: None    Subjective/Interval History: Continues to have a lot of coughing episodes which are dry for the most part.  Still wheezing a lot.  Short of breath with minimal exertion.  No chest pain.  Has upper abdominal pain.    Assessment/Plan:  COPD with acute exacerbation/bronchiectasis Currently not requiring any oxygen .  Patient is on nebulizer treatment with Brovana and budesonide.  Also on bronchodilators with DuoNebs. Patient is on Solu-Medrol .  Singulair Claritin . Patient is on azithromycin . Chest x-ray showed only bronchitic changes without any infiltrates or opacities. Influenza PCR, RSV and COVID-19 were negative.  HIV nonreactive. Patient noted to be afebrile.  Leukocytosis is likely due to steroids.  Tobacco abuse Counseled.  Nicotine patch  Upper abdominal pain Possibly due to cough issues.  LFTs were normal yesterday.  Will get abdominal x-ray.  PPI.  Continue to monitor closely for now.  Benign on examination.  Chronic diastolic CHF Uses Lasix  as needed in the outpatient setting.  No evidence for volume overload currently.  Continue to monitor.  Hypothyroidism Continue levothyroxine .  History of depression and anxiety Continue citalopram   Chronic pain syndrome/fibromyalgia Currently managed with Suboxone .  Follow-up with pain management  clinic.  Hypokalemia Supplemented.  Potassium noted to be 5.2 today.  Will recheck labs in the morning.  Magnesium  was 1.9.  Fungal dermatitis Continue with nystatin.  DVT Prophylaxis: Lovenox  will be initiated Code Status: Full code Family Communication: Discussed with patient Disposition Plan: Hopefully return home when improved  Status is: Observation The patient will require care spanning > 2 midnights and should be moved to inpatient because: Persistent shortness of breath, wheezing      Medications: Scheduled:  arformoterol  15 mcg Nebulization BID   azithromycin   250 mg Oral Daily   budesonide (PULMICORT) nebulizer solution  0.5 mg Nebulization BID   buprenorphine -naloxone   1 tablet Sublingual TID   citalopram   40 mg Oral Daily   dextromethorphan-guaiFENesin   1 tablet Oral BID   famotidine   20 mg Oral QHS   feeding supplement  237 mL Oral BID BM   ipratropium-albuterol   3 mL Nebulization TID   levothyroxine   150 mcg Oral q morning   loratadine   10 mg Oral Daily   methylPREDNISolone  sodium succinate  40 mg Intravenous Q12H   montelukast  10 mg Oral QHS   nicotine  14 mg Transdermal Daily   nystatin   Topical BID   pantoprazole   40 mg Oral BID   [START ON 08/12/2024] pramipexole  0.5 mg Oral Weekly   sodium chloride  flush  3 mL Intravenous Q12H   Continuous:  sodium chloride      PRN:sodium chloride , acetaminophen , albuterol , alum & mag hydroxide-simeth, butalbital -acetaminophen -caffeine , melatonin, nicotine polacrilex, ondansetron  (ZOFRAN ) IV, polyethylene glycol, sodium chloride  flush  Antibiotics: Anti-infectives (From admission, onward)    Start     Dose/Rate Route Frequency Ordered Stop   08/07/24 1000  azithromycin  (ZITHROMAX ) tablet 250 mg        250 mg Oral Daily 08/06/24 0934  08/12/24 0959   08/05/24 2330  azithromycin  (ZITHROMAX ) 500 mg in sodium chloride  0.9 % 250 mL IVPB        500 mg 250 mL/hr over 60 Minutes Intravenous  Once 08/05/24 2327  08/06/24 0042       Objective:  Vital Signs  Vitals:   08/07/24 0440 08/07/24 0748 08/07/24 0751 08/07/24 0755  BP: 128/62     Pulse: 74     Resp: 20     Temp: 98.1 F (36.7 C)     TempSrc: Oral     SpO2: 96% 93% 97% 99%  Weight:      Height:        Intake/Output Summary (Last 24 hours) at 08/07/2024 0831 Last data filed at 08/06/2024 1353 Gross per 24 hour  Intake 240 ml  Output --  Net 240 ml   Filed Weights   08/05/24 2209 08/06/24 0932  Weight: 60 kg 57.9 kg    General appearance: Awake alert.  In no distress Resp: Tachypneic.  No use of accessory muscles.  Wheezing bilaterally.  Few crackles at the bases.  No rhonchi. Cardio: S1-S2 is normal regular.  No S3-S4.  No rubs murmurs or bruit GI: Abdomen is soft.  Mildly tender in the epigastrium without any rebound rigidity or guarding.  No masses organomegaly. Extremities: No edema.  Full range of motion of lower extremities. Neurologic: Alert and oriented x3.  No focal neurological deficits.    Lab Results:  Data Reviewed: I have personally reviewed following labs and reports of the imaging studies  CBC: Recent Labs  Lab 08/05/24 2245 08/07/24 0438  WBC 7.2 16.0*  NEUTROABS 3.6  --   HGB 13.3 11.2*  HCT 39.6 34.3*  MCV 89.0 90.3  PLT 278 243    Basic Metabolic Panel: Recent Labs  Lab 08/05/24 2245 08/06/24 1001 08/07/24 0438  NA 139  --  139  K 3.3*  --  5.2*  CL 102  --  105  CO2 26  --  24  GLUCOSE 108*  --  177*  BUN 14  --  19  CREATININE 1.01*  --  0.94  CALCIUM  9.0  --  10.4*  MG 1.9  --   --   PHOS  --  2.8  --     GFR: Estimated Creatinine Clearance: 51.5 mL/min (by C-G formula based on SCr of 0.94 mg/dL).  Liver Function Tests: Recent Labs  Lab 08/05/24 2245  AST 17  ALT 10  ALKPHOS 82  BILITOT 0.7  PROT 7.1  ALBUMIN  4.0    Recent Results (from the past 240 hours)  Resp panel by RT-PCR (RSV, Flu A&B, Covid) Anterior Nasal Swab     Status: None   Collection Time:  08/05/24 10:45 PM   Specimen: Anterior Nasal Swab  Result Value Ref Range Status   SARS Coronavirus 2 by RT PCR NEGATIVE NEGATIVE Final    Comment: (NOTE) SARS-CoV-2 target nucleic acids are NOT DETECTED.  The SARS-CoV-2 RNA is generally detectable in upper respiratory specimens during the acute phase of infection. The lowest concentration of SARS-CoV-2 viral copies this assay can detect is 138 copies/mL. A negative result does not preclude SARS-Cov-2 infection and should not be used as the sole basis for treatment or other patient management decisions. A negative result may occur with  improper specimen collection/handling, submission of specimen other than nasopharyngeal swab, presence of viral mutation(s) within the areas targeted by this assay, and inadequate number of viral copies(<138  copies/mL). A negative result must be combined with clinical observations, patient history, and epidemiological information. The expected result is Negative.  Fact Sheet for Patients:  BloggerCourse.com  Fact Sheet for Healthcare Providers:  SeriousBroker.it  This test is no t yet approved or cleared by the [place]  FDA and  has been authorized for detection and/or diagnosis of SARS-CoV-2 by FDA under an Emergency Use Authorization (EUA). This EUA will remain  in effect (meaning this test can be used) for the duration of the COVID-19 declaration under Section 564(b)(1) of the Act, 21 U.S.C.section 360bbb-3(b)(1), unless the authorization is terminated  or revoked sooner.       Influenza A by PCR NEGATIVE NEGATIVE Final   Influenza B by PCR NEGATIVE NEGATIVE Final    Comment: (NOTE) The Xpert Xpress SARS-CoV-2/FLU/RSV plus assay is intended as an aid in the diagnosis of influenza from Nasopharyngeal swab specimens and should not be used as a sole basis for treatment. Nasal washings and aspirates are unacceptable for Xpert Xpress  SARS-CoV-2/FLU/RSV testing.  Fact Sheet for Patients: BloggerCourse.com  Fact Sheet for Healthcare Providers: SeriousBroker.it  This test is not yet approved or cleared by the [place]  FDA and has been authorized for detection and/or diagnosis of SARS-CoV-2 by FDA under an Emergency Use Authorization (EUA). This EUA will remain in effect (meaning this test can be used) for the duration of the COVID-19 declaration under Section 564(b)(1) of the Act, 21 U.S.C. section 360bbb-3(b)(1), unless the authorization is terminated or revoked.     Resp Syncytial Virus by PCR NEGATIVE NEGATIVE Final    Comment: (NOTE) Fact Sheet for Patients: BloggerCourse.com  Fact Sheet for Healthcare Providers: SeriousBroker.it  This test is not yet approved or cleared by the [place]  FDA and has been authorized for detection and/or diagnosis of SARS-CoV-2 by FDA under an Emergency Use Authorization (EUA). This EUA will remain in effect (meaning this test can be used) for the duration of the COVID-19 declaration under Section 564(b)(1) of the Act, 21 U.S.C. section 360bbb-3(b)(1), unless the authorization is terminated or revoked.  Performed at [facility], [address] [place], [place] [address]       Radiology Studies: DG Chest 2 View Result Date: 08/05/2024 EXAM: 2 VIEW(S) XRAY OF THE CHEST 08/05/2024 10:30:00 PM COMPARISON: chest radiograph 08/16/2020 CLINICAL HISTORY: shortness of breath, cough. 1 wk: wheezing, body aches, chills, fever, runny nose, SHOB FINDINGS: LUNGS AND PLEURA: Moderate bronchial thickening. No focal pulmonary opacity. No pulmonary edema. No pleural effusion. No pneumothorax. HEART AND MEDIASTINUM: Unchanged heart size and mediastinal contours. BONES AND SOFT TISSUES: Broad-based scoliotic curvature of the thoracic spine. Cervical fusion hardware noted. Upper  abdominal surgical clips noted. IMPRESSION: 1. Moderate bronchial thickening. Electronically signed by: Andrea Gasman MD 08/05/2024 10:49 PM EDT RP Workstation: [id number]       LOS: 0 days   Joette Pebbles  [facility] Pager on [email or web address]  08/07/2024, 8:31 AM

## 2024-08-07 NOTE — Plan of Care (Signed)
   Problem: Education: Goal: Knowledge of General Education information will improve Description: Including pain rating scale, medication(s)/side effects and non-pharmacologic comfort measures Outcome: Progressing   Problem: Clinical Measurements: Goal: Ability to maintain clinical measurements within normal limits will improve Outcome: Progressing Goal: Diagnostic test results will improve Outcome: Progressing

## 2024-08-08 DIAGNOSIS — J441 Chronic obstructive pulmonary disease with (acute) exacerbation: Secondary | ICD-10-CM | POA: Diagnosis not present

## 2024-08-08 LAB — CBC
HCT: 35.4 % — ABNORMAL LOW (ref 36.0–46.0)
Hemoglobin: 11.1 g/dL — ABNORMAL LOW (ref 12.0–15.0)
MCH: 29.2 pg (ref 26.0–34.0)
MCHC: 31.4 g/dL (ref 30.0–36.0)
MCV: 93.2 fL (ref 80.0–100.0)
Platelets: 247 K/uL (ref 150–400)
RBC: 3.8 MIL/uL — ABNORMAL LOW (ref 3.87–5.11)
RDW: 14.4 % (ref 11.5–15.5)
WBC: 14 K/uL — ABNORMAL HIGH (ref 4.0–10.5)
nRBC: 0 % (ref 0.0–0.2)

## 2024-08-08 LAB — BASIC METABOLIC PANEL WITH GFR
Anion gap: 11 (ref 5–15)
BUN: 23 mg/dL (ref 8–23)
CO2: 25 mmol/L (ref 22–32)
Calcium: 9.9 mg/dL (ref 8.9–10.3)
Chloride: 101 mmol/L (ref 98–111)
Creatinine, Ser: 1.05 mg/dL — ABNORMAL HIGH (ref 0.44–1.00)
GFR, Estimated: 59 mL/min — ABNORMAL LOW (ref >60)
Glucose, Bld: 199 mg/dL — ABNORMAL HIGH (ref 70–99)
Potassium: 5.3 mmol/L — ABNORMAL HIGH (ref 3.5–5.1)
Sodium: 137 mmol/L (ref 135–145)

## 2024-08-08 MED ORDER — SODIUM ZIRCONIUM CYCLOSILICATE 10 G PO PACK
10.0000 g | PACK | Freq: Once | ORAL | Status: AC
  Administered 2024-08-08: 10 g via ORAL
  Filled 2024-08-08: qty 1

## 2024-08-08 MED ORDER — METHYLPREDNISOLONE SODIUM SUCC 125 MG IJ SOLR
60.0000 mg | Freq: Three times a day (TID) | INTRAMUSCULAR | Status: DC
  Administered 2024-08-08 – 2024-08-09 (×3): 60 mg via INTRAVENOUS
  Filled 2024-08-08 (×3): qty 2

## 2024-08-08 NOTE — Progress Notes (Signed)
 [facility] PROGRESS NOTE   LUREE PALLA FMW:[id number] DOB: 02/21/1961 DOA: 08/05/2024  PCP: [facility]  Brief History: 63 y.o. female with medical history significant of asthma/COPD, chronic diastolic heart failure, prediabetes, fibromyalgia, gastroesophageal reflux disease, tobacco abuse and medication noncompliance; who presented to the hospital secondary to worsening shortness of breath, nonproductive coughing spells and increased wheezing.  Patient was noted to have COPD exacerbation and she was hospitalized for further management.    Consultants: None  Procedures: None  Subjective/Interval History: Patient reports not feeling much better.  Continues to have shortness of breath and wheezing.  Continues to have dry cough.  No chest pain.  Upper abdominal pain most likely due to coughing episodes.  Denies any nausea vomiting.    Assessment/Plan:  COPD with acute exacerbation/bronchiectasis Currently not requiring any oxygen .  Patient is on nebulizer treatment with Brovana and budesonide.  Also on bronchodilators with DuoNebs. Patient is on Solu-Medrol .  Singulair Claritin . Patient is on azithromycin . Chest x-ray showed only bronchitic changes without any infiltrates or opacities. Influenza PCR, RSV and COVID-19 were negative.  HIV nonreactive. Patient continues to be symptomatic.  Will increase the frequency of Solu-Medrol .  Continue with other treatment as outlined above.    Tobacco abuse Counseled.  Nicotine patch  Upper abdominal pain Possibly due to cough issues.  LFTs were normal.  Abdominal x-ray did not show any acute findings.  Continue PPI.  Continue to monitor closely.  Mild hyperkalemia Was initially hypokalemic.  Now potassium level noted to be elevated.  Probably due to steroids.  Will give her a dose of Lokelma and recheck labs in the morning.    Chronic diastolic CHF Uses Lasix  as needed in the outpatient setting.  No evidence for  volume overload currently.  Continue to monitor.  Hypothyroidism Continue levothyroxine .  History of depression and anxiety Continue citalopram   Chronic pain syndrome/fibromyalgia Currently managed with Suboxone .  Follow-up with pain management clinic.  Fungal dermatitis Continue with nystatin.  DVT Prophylaxis: Lovenox   Code Status: Full code Family Communication: Discussed with patient Disposition Plan: Hopefully return home when improved    Medications: Scheduled:  arformoterol  15 mcg Nebulization BID   azithromycin   250 mg Oral Daily   budesonide (PULMICORT) nebulizer solution  0.5 mg Nebulization BID   buprenorphine -naloxone   1 tablet Sublingual TID   citalopram   40 mg Oral Daily   dextromethorphan-guaiFENesin   1 tablet Oral BID   enoxaparin  (LOVENOX ) injection  40 mg Subcutaneous Q24H   famotidine   20 mg Oral QHS   feeding supplement  237 mL Oral TID BM   ipratropium-albuterol   3 mL Nebulization TID   levothyroxine   150 mcg Oral q morning   loratadine   10 mg Oral Daily   methylPREDNISolone  sodium succinate  60 mg Intravenous Q8H   montelukast  10 mg Oral QHS   multivitamin with minerals  1 tablet Oral Daily   nicotine  14 mg Transdermal Daily   nystatin   Topical BID   pantoprazole   40 mg Oral BID   [START ON 08/12/2024] pramipexole  0.5 mg Oral Weekly   sodium chloride  flush  3 mL Intravenous Q12H   Continuous:   PRN:acetaminophen , albuterol , alum & mag hydroxide-simeth, butalbital -acetaminophen -caffeine , melatonin, nicotine polacrilex, ondansetron  (ZOFRAN ) IV, polyethylene glycol, sodium chloride  flush  Antibiotics: Anti-infectives (From admission, onward)    Start     Dose/Rate Route Frequency Ordered Stop   08/07/24 1000  azithromycin  (ZITHROMAX ) tablet 250 mg  250 mg Oral Daily 08/06/24 0934 08/12/24 0959   08/05/24 2330  azithromycin  (ZITHROMAX ) 500 mg in sodium chloride  0.9 % 250 mL IVPB        500 mg 250 mL/hr over 60 Minutes Intravenous   Once 08/05/24 2327 08/06/24 0042       Objective:  Vital Signs  Vitals:   08/07/24 1426 08/07/24 1942 08/08/24 0254 08/08/24 0257  BP:  (!) 127/50 (!) 128/58   Pulse:  99 80   Resp:  18 18   Temp:  97.7 F (36.5 C) 97.6 F (36.4 C)   TempSrc:  Oral Oral   SpO2: 93% 96% 95%   Weight:    59.2 kg  Height:        Intake/Output Summary (Last 24 hours) at 08/08/2024 0957 Last data filed at 08/08/2024 0900 Gross per 24 hour  Intake 783 ml  Output 1100 ml  Net -317 ml   Filed Weights   08/05/24 2209 08/06/24 0932 08/08/24 0257  Weight: 60 kg 57.9 kg 59.2 kg    General appearance: Awake alert.  In no distress Resp:.  Noted to be tachypneic.  No use of accessory muscles.  Diffuse wheezing bilaterally.  Few crackles at the bases. Cardio: S1-S2 is normal regular.  No S3-S4.  No rubs murmurs or bruit GI: Abdomen is soft.  Tender in the upper abdomen without any rebound rigidity or guarding.  No masses organomegaly. Extremities: No edema.  Full range of motion of lower extremities. Neurologic: Alert and oriented x3.  No focal neurological deficits.    Lab Results:  Data Reviewed: I have personally reviewed following labs and reports of the imaging studies  CBC: Recent Labs  Lab 08/05/24 2245 08/07/24 0438 08/08/24 0415  WBC 7.2 16.0* 14.0*  NEUTROABS 3.6  --   --   HGB 13.3 11.2* 11.1*  HCT 39.6 34.3* 35.4*  MCV 89.0 90.3 93.2  PLT 278 243 247    Basic Metabolic Panel: Recent Labs  Lab 08/05/24 2245 08/06/24 1001 08/07/24 0438 08/08/24 0415  NA 139  --  139 137  K 3.3*  --  5.2* 5.3*  CL 102  --  105 101  CO2 26  --  24 25  GLUCOSE 108*  --  177* 199*  BUN 14  --  19 23  CREATININE 1.01*  --  0.94 1.05*  CALCIUM  9.0  --  10.4* 9.9  MG 1.9  --   --   --   PHOS  --  2.8  --   --     GFR: Estimated Creatinine Clearance: 46.1 mL/min (A) (by C-G formula based on SCr of 1.05 mg/dL (H)).  Liver Function Tests: Recent Labs  Lab 08/05/24 2245  AST 17   ALT 10  ALKPHOS 82  BILITOT 0.7  PROT 7.1  ALBUMIN  4.0    Recent Results (from the past 240 hours)  Resp panel by RT-PCR (RSV, Flu A&B, Covid) Anterior Nasal Swab     Status: None   Collection Time: 08/05/24 10:45 PM   Specimen: Anterior Nasal Swab  Result Value Ref Range Status   SARS Coronavirus 2 by RT PCR NEGATIVE NEGATIVE Final    Comment: (NOTE) SARS-CoV-2 target nucleic acids are NOT DETECTED.  The SARS-CoV-2 RNA is generally detectable in upper respiratory specimens during the acute phase of infection. The lowest concentration of SARS-CoV-2 viral copies this assay can detect is 138 copies/mL. A negative result does not preclude SARS-Cov-2 infection and should not  be used as the sole basis for treatment or other patient management decisions. A negative result may occur with  improper specimen collection/handling, submission of specimen other than nasopharyngeal swab, presence of viral mutation(s) within the areas targeted by this assay, and inadequate number of viral copies(<138 copies/mL). A negative result must be combined with clinical observations, patient history, and epidemiological information. The expected result is Negative.  Fact Sheet for Patients:  BloggerCourse.com  Fact Sheet for Healthcare Providers:  SeriousBroker.it  This test is no t yet approved or cleared by the [place]  FDA and  has been authorized for detection and/or diagnosis of SARS-CoV-2 by FDA under an Emergency Use Authorization (EUA). This EUA will remain  in effect (meaning this test can be used) for the duration of the COVID-19 declaration under Section 564(b)(1) of the Act, 21 U.S.C.section 360bbb-3(b)(1), unless the authorization is terminated  or revoked sooner.       Influenza A by PCR NEGATIVE NEGATIVE Final   Influenza B by PCR NEGATIVE NEGATIVE Final    Comment: (NOTE) The Xpert Xpress SARS-CoV-2/FLU/RSV plus assay is  intended as an aid in the diagnosis of influenza from Nasopharyngeal swab specimens and should not be used as a sole basis for treatment. Nasal washings and aspirates are unacceptable for Xpert Xpress SARS-CoV-2/FLU/RSV testing.  Fact Sheet for Patients: BloggerCourse.com  Fact Sheet for Healthcare Providers: SeriousBroker.it  This test is not yet approved or cleared by the [place]  FDA and has been authorized for detection and/or diagnosis of SARS-CoV-2 by FDA under an Emergency Use Authorization (EUA). This EUA will remain in effect (meaning this test can be used) for the duration of the COVID-19 declaration under Section 564(b)(1) of the Act, 21 U.S.C. section 360bbb-3(b)(1), unless the authorization is terminated or revoked.     Resp Syncytial Virus by PCR NEGATIVE NEGATIVE Final    Comment: (NOTE) Fact Sheet for Patients: BloggerCourse.com  Fact Sheet for Healthcare Providers: SeriousBroker.it  This test is not yet approved or cleared by the [place]  FDA and has been authorized for detection and/or diagnosis of SARS-CoV-2 by FDA under an Emergency Use Authorization (EUA). This EUA will remain in effect (meaning this test can be used) for the duration of the COVID-19 declaration under Section 564(b)(1) of the Act, 21 U.S.C. section 360bbb-3(b)(1), unless the authorization is terminated or revoked.  Performed at [facility], [address] [place], [place] [address]       Radiology Studies: DG Abd Portable 1V Result Date: 08/07/2024 EXAM: 1 VIEW XRAY OF THE ABDOMEN 08/07/2024 09:48:00 AM COMPARISON: None available. CLINICAL HISTORY: Nausea. FINDINGS: BOWEL: Nonobstructive bowel gas pattern. No dilated loops of large or small bowel. SOFT TISSUES: No pathologic calcifications. No organomegaly. No intraperitoneal free air. Cholecystectomy clips. No opaque urinary  calculi. BONES: No acute osseous abnormality. No aggressive osseous lesion. Posterior lumbar fusion. IMPRESSION: 1. No acute abdominal abnormality. 2. Postoperative changes: cholecystectomy clips and posterior lumbar fusion hardware. Electronically signed by: Norleen Boxer MD 08/07/2024 03:32 PM EDT RP Workstation: [id number]       LOS: 1 day   Joette Pebbles  [facility] Pager on [email or web address]  08/08/2024, 9:57 AM

## 2024-08-08 NOTE — Progress Notes (Signed)
 Mobility Specialist Progress Note:    08/08/24 1315  Mobility  Activity Ambulated with assistance  Level of Assistance Standby assist, set-up cues, supervision of patient - no hands on  Assistive Device Other (Comment) (Uses railings)  Distance Ambulated (ft) 80 ft  Range of Motion/Exercises Active;All extremities  Activity Response Tolerated well  Mobility Referral Yes  Mobility visit 1 Mobility  Mobility Specialist Start Time (ACUTE ONLY) 1315  Mobility Specialist Stop Time (ACUTE ONLY) 1335  Mobility Specialist Time Calculation (min) (ACUTE ONLY) 20 min   Pt received in bed, agreeable to mobility. Required supervision to stand and ambulate using railings for support. Tolerated well, audible SOB. SpO2 99% on RA at rest, SpO2 95-98% on RA during ambulation. Returned supine, all needs met.  Jenisa Monty Mobility Specialist Please contact via Special educational needs teacher or  Rehab office at [phone]

## 2024-08-08 NOTE — Plan of Care (Signed)
   Problem: Activity: Goal: Risk for activity intolerance will decrease Outcome: Progressing   Problem: Coping: Goal: Level of anxiety will decrease Outcome: Progressing

## 2024-08-09 LAB — COMPREHENSIVE METABOLIC PANEL WITH GFR
ALT: 11 U/L (ref 0–44)
AST: 13 U/L — ABNORMAL LOW (ref 15–41)
Albumin: 3.8 g/dL (ref 3.5–5.0)
Alkaline Phosphatase: 63 U/L (ref 38–126)
Anion gap: 10 (ref 5–15)
BUN: 24 mg/dL — ABNORMAL HIGH (ref 8–23)
CO2: 28 mmol/L (ref 22–32)
Calcium: 9.5 mg/dL (ref 8.9–10.3)
Chloride: 100 mmol/L (ref 98–111)
Creatinine, Ser: 1.02 mg/dL — ABNORMAL HIGH (ref 0.44–1.00)
GFR, Estimated: >60 mL/min (ref >60)
Glucose, Bld: 172 mg/dL — ABNORMAL HIGH (ref 70–99)
Potassium: 4.5 mmol/L (ref 3.5–5.1)
Sodium: 138 mmol/L (ref 135–145)
Total Bilirubin: <0.2 mg/dL (ref 0.0–1.2)
Total Protein: 5.8 g/dL — ABNORMAL LOW (ref 6.5–8.1)

## 2024-08-09 MED ORDER — LEVOTHYROXINE SODIUM 150 MCG PO TABS: 150.0000 ug | ORAL_TABLET | Freq: Every morning | ORAL | 5 refills | Status: AC

## 2024-08-09 MED ORDER — COMPRESSOR/NEBULIZER MISC: 1.0000 [IU] | Freq: Every day | 0 refills | Status: AC | PRN

## 2024-08-09 MED ORDER — AZITHROMYCIN 500 MG PO TABS: 500.0000 mg | ORAL_TABLET | Freq: Every day | ORAL | 0 refills | Status: AC

## 2024-08-09 MED ORDER — MONTELUKAST SODIUM 10 MG PO TABS: 10.0000 mg | ORAL_TABLET | Freq: Every day | ORAL | 5 refills | Status: AC

## 2024-08-09 MED ORDER — ALBUTEROL SULFATE (2.5 MG/3ML) 0.083% IN NEBU: 2.5000 mg | INHALATION_SOLUTION | RESPIRATORY_TRACT | 12 refills | Status: AC | PRN

## 2024-08-09 MED ORDER — NICOTINE 21 MG/24HR TD PT24: 21.0000 mg | MEDICATED_PATCH | TRANSDERMAL | 0 refills | Status: AC

## 2024-08-09 MED ORDER — ATORVASTATIN CALCIUM 20 MG PO TABS: 20.0000 mg | ORAL_TABLET | Freq: Every day | ORAL | 11 refills | Status: AC

## 2024-08-09 MED ORDER — PREDNISONE 20 MG PO TABS: 40.0000 mg | ORAL_TABLET | Freq: Every day | ORAL | 0 refills | Status: AC

## 2024-08-09 MED ORDER — ALBUTEROL SULFATE HFA 108 (90 BASE) MCG/ACT IN AERS: 2.0000 | INHALATION_SPRAY | RESPIRATORY_TRACT | 0 refills | Status: AC | PRN

## 2024-08-09 MED ORDER — ONDANSETRON 4 MG PO TBDP: 4.0000 mg | ORAL_TABLET | Freq: Three times a day (TID) | ORAL | 0 refills | Status: AC | PRN

## 2024-08-09 MED ORDER — BUDESONIDE-FORMOTEROL FUMARATE 80-4.5 MCG/ACT IN AERO: 2.0000 | INHALATION_SPRAY | Freq: Two times a day (BID) | RESPIRATORY_TRACT | 12 refills | Status: AC

## 2024-08-09 MED ORDER — NYSTATIN 100000 UNIT/GM EX POWD: Freq: Two times a day (BID) | CUTANEOUS | 0 refills | Status: AC

## 2024-08-09 MED ORDER — OMEPRAZOLE 20 MG PO CPDR: 20.0000 mg | DELAYED_RELEASE_CAPSULE | Freq: Every day | ORAL | 5 refills | Status: AC

## 2024-08-09 MED ORDER — ASPIRIN 81 MG PO TBEC
81.0000 mg | DELAYED_RELEASE_TABLET | Freq: Every day | ORAL | 11 refills | Status: AC
Start: 2024-08-09 — End: 2025-08-09

## 2024-08-09 NOTE — Progress Notes (Signed)
 Mobility Specialist Progress Note:    08/09/24 1105  Mobility  Activity Ambulated with assistance  Level of Assistance Modified independent, requires aide device or extra time  Assistive Device Other (Comment) (railings)  Distance Ambulated (ft) 140 ft  Range of Motion/Exercises Active;All extremities  Activity Response Tolerated well  Mobility Referral Yes  Mobility visit 1 Mobility  Mobility Specialist Start Time (ACUTE ONLY) 1105  Mobility Specialist Stop Time (ACUTE ONLY) 1125  Mobility Specialist Time Calculation (min) (ACUTE ONLY) 20 min   Pt received in bed, agreeable to mobility. ModI to stand and ambulate using railings for support. Tolerated well, wheezing is improving and pt did not get SOB compared to yesterday. Returned supine, all needs met.  [name] Mobility Specialist Please contact via Special educational needs teacher or  Rehab office at [phone]

## 2024-08-09 NOTE — Care Management Important Message (Signed)
 Important Message  Patient Details  Name: [name] MRN: [id number] Date of Birth: 07/08/1961   Important Message Given:  Yes - Medicare IM     Khalik Pewitt L Tayana Shankle 08/09/2024, 12:48 PM

## 2024-08-09 NOTE — TOC Transition Note (Signed)
 Transition of Care [facility]) - Discharge Note   Patient Details  Name: [name] MRN: [id number] Date of Birth: [birth year]  Transition of Care [facility]) CM/SW Contact:  Noreen KATHEE Cleotilde ISRAEL Phone Number: 08/09/2024, 1:37 PM   Clinical Narrative:     Patient is discharging today. No oxygen  needs, but MD stated that pt needed a nebulizer machine. Patient agreeable. [employer] able to accept referral and patient insurance. CSW signing off.   Final next level of care: Home/Self Care Barriers to Discharge: Barriers Resolved   Patient Goals and CMS Choice Patient states their goals for this hospitalization and ongoing recovery are:: return home CMS Medicare.gov Compare Post Acute Care list provided to:: Patient        Discharge Placement                Patient to be transferred to facility by: POV Name of family member notified: [place] Patient and family notified of of transfer: 08/09/24  Discharge Plan and Services Additional resources added to the After Visit Summary for                  DME Arranged: Nebulizer machine DME Agency: [employer] Date DME Agency Contacted: 08/09/24 Time DME Agency Contacted: [phone] Representative spoke with at DME Agency: Darlyn            Social Drivers of Health (SDOH) Interventions SDOH Screenings   Food Insecurity: Food Insecurity Present (08/06/2024)  Housing: Low Risk  (08/06/2024)  Transportation Needs: Unmet Transportation Needs (08/06/2024)  Utilities: At Risk (08/06/2024)  Social Connections: Socially Isolated (08/06/2024)  Tobacco Use: High Risk (08/05/2024)     Readmission Risk Interventions    08/09/2024    1:36 PM  Readmission Risk Prevention Plan  Medication Screening Complete  Transportation Screening Complete

## 2024-08-09 NOTE — Discharge Instructions (Signed)
 1)Very Low-salt diet advised---Less than 2 gm of Sodium per day advised----ok to use Mrs DASH salt substitute instead of Salt 2)Avoid ibuprofen /Advil /Aleve/Motrin Josefine Powders/Naproxen/BC powders/Meloxicam/Diclofenac /Indomethacin and other Nonsteroidal anti-inflammatory medications as these will make you more likely to bleed and can cause stomach ulcers, can also cause Kidney problems.  3) complete abstinence from tobacco advised--ok to use Over the Counter Nicotine Patch to help you quit smoking 4) please note that there has been some changes to medications 5)follow up with PCP ([facility]) for recheck within a week

## 2024-08-09 NOTE — Discharge Summary (Signed)
 [name], is a 63 y.o. female  DOB Aug 15, 1961  MRN [id number].  Admission date:  08/05/2024  Admitting Physician  Dorn Dawson, MD  Discharge Date:  08/09/2024   Primary MD  [facility]  Recommendations for primary care physician for things to follow:  1)Very Low-salt diet advised---Less than 2 gm of Sodium per day advised----ok to use Mrs DASH salt substitute instead of Salt 2)Avoid ibuprofen /Advil /Aleve/Motrin Josefine Powders/Naproxen/BC powders/Meloxicam/Diclofenac /Indomethacin and other Nonsteroidal anti-inflammatory medications as these will make you more likely to bleed and can cause stomach ulcers, can also cause Kidney problems.  3) complete abstinence from tobacco advised--ok to use Over the Counter Nicotine Patch to help you quit smoking 4) please note that there has been some changes to medications 5)follow up with PCP ([facility]) for recheck within a week  Admission Diagnosis  COPD exacerbation (HCC) [J44.1]   Discharge Diagnosis  COPD exacerbation (HCC) [J44.1]    Principal Problem:   COPD exacerbation (HCC) Active Problems:   GERD (gastroesophageal reflux disease)   Tobacco use disorder   Hypothyroidism      Past Medical History:  Diagnosis Date   Anxiety    Asthma    Albuterol  prn    CHF (congestive heart failure) (HCC)    takes Furosemide  daily   Chronic back pain    DDD/scoliosis   Complication of anesthesia    hard to wake up   COPD (chronic obstructive pulmonary disease) (HCC)    DDD (degenerative disc disease)    Depression    takes Celexa ,Clonazepam  daily   Diabetes mellitus    borderline   Diverticulitis    Edema of lower extremity    Emphysema lung (HCC)    Fibromyalgia    Gastritis    GERD (gastroesophageal reflux disease)    takes Pepcid  daily   Gout    Gout of big toe    RIGHT   Hemorrhoids    Hiatal hernia     History of bronchitis    last time 4-41months ago    History of colon polyps    History of kidney stones    is in her left kidney and has been for couple of yrs and no problems   HTN (hypertension)    takes lisinopril  daily   Hyperlipidemia    but doesn't take any meds   Hypothyroidism    takes Synthroid  daily   Insomnia    doesn't take any meds for this   Joint pain    Joint swelling    Migraine    last 04/04/18   Migraine    MS (multiple sclerosis)    early stages   Muscle spasm    takes Flexeril  daily as needed   Neuropathy    Osteoporosis    Phlebitis    Pneumonia    hx of;last time in 2014   PONV (postoperative nausea and vomiting)    Shortness of breath    with exertion or stressed    Sleep apnea    sleep  study done at least 26yrs ago;has CPAP but doesn't use   Stroke [facility])    left sided weakness   TIA (transient ischemic attack)    Hx: of   Urinary frequency     Past Surgical History:  Procedure Laterality Date   ABDOMINAL HYSTERECTOMY     ANTERIOR CERVICAL DECOMP/DISCECTOMY FUSION N/A 12/30/2013   Procedure: ANTERIOR CERVICAL DISCECTOMY FUSION C4-5, C5-6, plate and screws, allograft, local bone graft;  Surgeon: Lynwood FORBES Better, MD;  Location: [facility] OR;  Service: Orthopedics;  Laterality: N/A;   BACK SURGERY     lumbar discectomy   BIOPSY N/A 01/20/2015   Procedure: COLON BIOPSY;  Surgeon: Margo LITTIE Haddock, MD;  Location: [facility] ORS;  Service: Endoscopy;  Laterality: N/A;   BIOPSY  05/22/2018   Procedure: BIOPSY;  Surgeon: Haddock Margo LITTIE, MD;  Location: [facility] ENDO SUITE;  Service: Endoscopy;;  gastric   BLADDER SUSPENSION     CARDIAC CATHETERIZATION     CARPAL TUNNEL RELEASE Right    CHOLECYSTECTOMY     COLONOSCOPY     COLONOSCOPY WITH PROPOFOL  N/A 01/20/2015   SLF: 1. Normal ileum 2. Redundant left colon 3. Smalll internal hemorrhoids.    ESOPHAGOGASTRODUODENOSCOPY  10/29/07   normal   ESOPHAGOGASTRODUODENOSCOPY (EGD) WITH PROPOFOL  N/A 05/22/2018   Procedure:  ESOPHAGOGASTRODUODENOSCOPY (EGD) WITH PROPOFOL ;  Surgeon: Haddock Margo LITTIE, MD;  Location: [facility] ENDO SUITE;  Service: Endoscopy;  Laterality: N/A;  9:30am   fundic gland polyp     benign   POLYPECTOMY N/A 01/20/2015   Procedure: RECTAL POLYPECTOMY;  Surgeon: Margo LITTIE Haddock, MD;  Location: [facility] ORS;  Service: Endoscopy;  Laterality: N/A;   SIGMOIDOSCOPY  01/31/08   large internal hemorrhoids/small rectal polyp removed/rare sigmoid diverticula     HPI  from the history and physical done on the day of admission:   HPI: [name] is a 63 y.o. female with medical history significant of asthma/COPD, chronic diastolic heart failure, prediabetes, fibromyalgia, gastroesophageal reflux disease, tobacco abuse and medication noncompliance; who presented to the hospital secondary to worsening shortness of breath, nonproductive coughing spells and increased wheezing.  Patient reports symptom has been present for the last 2-3 days and worsening.  No associated fever, sick contacts, neurologic deficits, dysuria/hematuria, melena, hematochezia, abdominal pain or associated chest pain.   Workup in the ED demonstrating chest x-ray with moderate bronchial thickening/bronchitic changes; respiratory panel by PCR negative for COVID, influenza and RSV, no elevated WBCs and BNP 109.   Nebulizer management and his steroids were provided; TR[facility] contacted to place patient in the hospital for further evaluation and management.   Review of Systems: As mentioned in the history of present illness. All other systems reviewed and are negative.   Hospital Course:     Assessment and Plan: COPD with acute exacerbation/bronchiectasis - Bronchodilators including DuoNebs, Brovana and budesonide,  - Treated with IV steroids and azithromycin  as well as Singulair Chest x-ray showed only bronchitic changes without any infiltrates or opacities. Influenza PCR, RSV and COVID-19 were negative.  HIV nonreactive. Patient continues to be  symptomatic.  - Much improved with above measures -Discharged home with prednisone  and azithromycin  and bronchodilators   Tobacco abuse -Smoking cessation strongly advised, okay to use OTC nicotine patch   Upper abdominal pain Possibly due to cough issues.  LFTs were normal.  Abdominal x-ray did not show any acute findings.  Treated with PPI.   Mild hyperkalemia Was initially hypokalemic.  Now potassium level noted to be elevated.  With  potassium replacements and steroid therapy -Normalized after treatment with Lokelma  Chronic diastolic CHF Stable without acute exacerbation at this time uses Lasix  as needed in the outpatient setting.  No evidence for volume overload currently.    Hypothyroidism Continue levothyroxine .   History of depression and anxiety Continue citalopram    Chronic pain syndrome/fibromyalgia Currently managed with Suboxone .  Follow-up with pain management clinic.   Fungal dermatitis Continue with nystatin.    Discharge Condition: Stable  Follow UP   Follow-up Information     [facility] Schedule an appointment as soon as possible for a visit in 1 week(s).   Contact information: [address] [place] [address] [phone]                 Diet and Activity recommendation:  As advised  Discharge Instructions    Discharge Instructions     Call MD for:  difficulty breathing, headache or visual disturbances   Complete by: As directed    Call MD for:  persistant dizziness or light-headedness   Complete by: As directed    Call MD for:  persistant nausea and vomiting   Complete by: As directed    Call MD for:  temperature >100.4   Complete by: As directed    Diet - low sodium heart healthy   Complete by: As directed    Discharge instructions   Complete by: As directed    1)Very Low-salt diet advised---Less than 2 gm of Sodium per day advised----ok to use Mrs DASH salt substitute instead of Salt 2)Avoid  ibuprofen /Advil /Aleve/Motrin Josefine Powders/Naproxen/BC powders/Meloxicam/Diclofenac /Indomethacin and other Nonsteroidal anti-inflammatory medications as these will make you more likely to bleed and can cause stomach ulcers, can also cause Kidney problems.  3) complete abstinence from tobacco advised--ok to use Over the Counter Nicotine Patch to help you quit smoking 4) please note that there has been some changes to medications 5)follow up with PCP ([facility]) for recheck within a week   For home use only DME Nebulizer machine   Complete by: As directed    Patient needs a nebulizer to treat with the following condition: Dyspnea and respiratory abnormalities   Length of Need: Lifetime   Additional equipment included:  Administration kit Filter     Increase activity slowly   Complete by: As directed         Discharge Medications     Allergies as of 08/09/2024       Reactions   Demerol [meperidine] Anaphylaxis, Swelling   Imitrex [sumatriptan] Anaphylaxis   Penicillins Anaphylaxis, Swelling   Has patient had a PCN reaction causing immediate rash, facial/tongue/throat swelling, SOB or lightheadedness with hypotension: Yes Has patient had a PCN reaction causing severe rash involving mucus membranes or skin necrosis: No Has patient had a PCN reaction that required hospitalization Yes Has patient had a PCN reaction occurring within the last 10 years: No If all of the above answers are NO, then may proceed with Cephalosporin use.   Sulfa Antibiotics Anaphylaxis   Abilify [aripiprazole] Nausea And Vomiting   Levaquin  [levofloxacin  In D5w]    Abdominal pain   Neurontin [gabapentin] Other (See Comments)   Abdominal pain   Toradol  [ketorolac  Tromethamine ] Other (See Comments)   Feels like my whole body is on fire.   Tramadol  Other (See Comments)   Stomach upset   Ceclor [cefaclor] Nausea Only   Doxycycline  Nausea And Vomiting  Medication List      STOP taking these medications    ibuprofen  800 MG tablet Commonly known as: ADVIL        TAKE these medications    albuterol  108 (90 Base) MCG/ACT inhaler Commonly known as: VENTOLIN  HFA Inhale 2 puffs into the lungs every 4 (four) hours as needed for wheezing or shortness of breath. What changed: Another medication with the same name was added. Make sure you understand how and when to take each.   albuterol  (2.5 MG/3ML) 0.083% nebulizer solution Commonly known as: PROVENTIL  Take 3 mLs (2.5 mg total) by nebulization every 4 (four) hours as needed for wheezing or shortness of breath. What changed: You were already taking a medication with the same name, and this prescription was added. Make sure you understand how and when to take each.   aspirin  EC 81 MG tablet Take 1 tablet (81 mg total) by mouth daily with breakfast.   atorvastatin  20 MG tablet Commonly known as: Lipitor Take 1 tablet (20 mg total) by mouth daily.   azithromycin  500 MG tablet Commonly known as: ZITHROMAX  Take 1 tablet (500 mg total) by mouth daily for 3 days.   bismuth subsalicylate 262 MG chewable tablet Commonly known as: PEPTO BISMOL Chew 524 mg by mouth as needed for indigestion or diarrhea or loose stools.   budesonide-formoterol  80-4.5 MCG/ACT inhaler Commonly known as: SYMBICORT Inhale 2 puffs into the lungs 2 (two) times daily.   Buprenorphine  HCl-Naloxone  HCl 8-2 MG Film Dissolve 1 film under the tongue 3 (three) times daily. What changed: Another medication with the same name was removed. Continue taking this medication, and follow the directions you see here.   citalopram  40 MG tablet Commonly known as: CELEXA  Take 40 mg by mouth daily.   Compressor/Nebulizer Misc 1 Units by Does not apply route daily as needed.   fenofibrate micronized 134 MG capsule Commonly known as: LOFIBRA Take 134 mg by mouth daily before breakfast.   levothyroxine  150 MCG tablet Commonly known as:  SYNTHROID  Take 1 tablet (150 mcg total) by mouth every morning.   loperamide  2 MG tablet Commonly known as: Imodium  A-D Take 1 tablet (2 mg total) by mouth 4 (four) times daily as needed for diarrhea or loose stools.   montelukast 10 MG tablet Commonly known as: SINGULAIR Take 1 tablet (10 mg total) by mouth at bedtime. What changed: See the new instructions.   Narcan  4 MG/0.1ML Liqd nasal spray kit Generic drug: naloxone  Place 1 spray into the nose once.   nicotine 21 mg/24hr patch Commonly known as: NICODERM CQ - dosed in mg/24 hours Place 1 patch (21 mg total) onto the skin daily for 28 days.   nystatin powder Commonly known as: MYCOSTATIN/NYSTOP Apply topically 2 (two) times daily.   omeprazole 20 MG capsule Commonly known as: PRILOSEC Take 1 capsule (20 mg total) by mouth daily.   ondansetron  4 MG disintegrating tablet Commonly known as: ZOFRAN -ODT Take 1 tablet (4 mg total) by mouth every 8 (eight) hours as needed for nausea or vomiting. What changed: Another medication with the same name was removed. Continue taking this medication, and follow the directions you see here.   pramipexole 0.5 MG tablet Commonly known as: MIRAPEX Take 0.5 mg by mouth once a week. Friday   predniSONE  20 MG tablet Commonly known as: DELTASONE  Take 2 tablets (40 mg total) by mouth daily with breakfast for 5 days.   promethazine  25 MG tablet Commonly known as: PHENERGAN  Take 1 tablet (  25 mg total) by mouth every 6 (six) hours as needed for nausea or vomiting.   VICKS DAYQUIL/NYQUIL CLD & FLU PO Take by mouth.               Durable Medical Equipment  (From admission, onward)           Start     Ordered   08/09/24 0000  For home use only DME Nebulizer machine       Question Answer Comment  Patient needs a nebulizer to treat with the following condition Dyspnea and respiratory abnormalities   Length of Need Lifetime   Additional equipment included Administration kit    Additional equipment included Filter      08/09/24 1323            Major procedures and Radiology Reports - PLEASE review detailed and final reports for all details, in brief -   DG Abd Portable 1V Result Date: 08/07/2024 EXAM: 1 VIEW XRAY OF THE ABDOMEN 08/07/2024 09:48:00 AM COMPARISON: None available. CLINICAL HISTORY: Nausea. FINDINGS: BOWEL: Nonobstructive bowel gas pattern. No dilated loops of large or small bowel. SOFT TISSUES: No pathologic calcifications. No organomegaly. No intraperitoneal free air. Cholecystectomy clips. No opaque urinary calculi. BONES: No acute osseous abnormality. No aggressive osseous lesion. Posterior lumbar fusion. IMPRESSION: 1. No acute abdominal abnormality. 2. Postoperative changes: cholecystectomy clips and posterior lumbar fusion hardware. Electronically signed by: Norleen Boxer MD 08/07/2024 03:32 PM EDT RP Workstation: [id number]   DG Chest 2 View Result Date: 08/05/2024 EXAM: 2 VIEW(S) XRAY OF THE CHEST 08/05/2024 10:30:00 PM COMPARISON: chest radiograph 08/16/2020 CLINICAL HISTORY: shortness of breath, cough. 1 wk: wheezing, body aches, chills, fever, runny nose, SHOB FINDINGS: LUNGS AND PLEURA: Moderate bronchial thickening. No focal pulmonary opacity. No pulmonary edema. No pleural effusion. No pneumothorax. HEART AND MEDIASTINUM: Unchanged heart size and mediastinal contours. BONES AND SOFT TISSUES: Broad-based scoliotic curvature of the thoracic spine. Cervical fusion hardware noted. Upper abdominal surgical clips noted. IMPRESSION: 1. Moderate bronchial thickening. Electronically signed by: Andrea Gasman MD 08/05/2024 10:49 PM EDT RP Workstation: [id number]    Micro Results  Recent Results (from the past 240 hours)  Resp panel by RT-PCR (RSV, Flu A&B, Covid) Anterior Nasal Swab     Status: None   Collection Time: 08/05/24 10:45 PM   Specimen: Anterior Nasal Swab  Result Value Ref Range Status   SARS Coronavirus 2 by RT PCR NEGATIVE NEGATIVE  Final    Comment: (NOTE) SARS-CoV-2 target nucleic acids are NOT DETECTED.  The SARS-CoV-2 RNA is generally detectable in upper respiratory specimens during the acute phase of infection. The lowest concentration of SARS-CoV-2 viral copies this assay can detect is 138 copies/mL. A negative result does not preclude SARS-Cov-2 infection and should not be used as the sole basis for treatment or other patient management decisions. A negative result may occur with  improper specimen collection/handling, submission of specimen other than nasopharyngeal swab, presence of viral mutation(s) within the areas targeted by this assay, and inadequate number of viral copies(<138 copies/mL). A negative result must be combined with clinical observations, patient history, and epidemiological information. The expected result is Negative.  Fact Sheet for Patients:  BloggerCourse.com  Fact Sheet for Healthcare Providers:  SeriousBroker.it  This test is no t yet approved or cleared by the [place]  FDA and  has been authorized for detection and/or diagnosis of SARS-CoV-2 by FDA under an Emergency Use Authorization (EUA). This EUA will remain  in effect (meaning this test can  be used) for the duration of the COVID-19 declaration under Section 564(b)(1) of the Act, 21 U.S.C.section 360bbb-3(b)(1), unless the authorization is terminated  or revoked sooner.       Influenza A by PCR NEGATIVE NEGATIVE Final   Influenza B by PCR NEGATIVE NEGATIVE Final    Comment: (NOTE) The Xpert Xpress SARS-CoV-2/FLU/RSV plus assay is intended as an aid in the diagnosis of influenza from Nasopharyngeal swab specimens and should not be used as a sole basis for treatment. Nasal washings and aspirates are unacceptable for Xpert Xpress SARS-CoV-2/FLU/RSV testing.  Fact Sheet for Patients: BloggerCourse.com  Fact Sheet for Healthcare  Providers: SeriousBroker.it  This test is not yet approved or cleared by the [place]  FDA and has been authorized for detection and/or diagnosis of SARS-CoV-2 by FDA under an Emergency Use Authorization (EUA). This EUA will remain in effect (meaning this test can be used) for the duration of the COVID-19 declaration under Section 564(b)(1) of the Act, 21 U.S.C. section 360bbb-3(b)(1), unless the authorization is terminated or revoked.     Resp Syncytial Virus by PCR NEGATIVE NEGATIVE Final    Comment: (NOTE) Fact Sheet for Patients: BloggerCourse.com  Fact Sheet for Healthcare Providers: SeriousBroker.it  This test is not yet approved or cleared by the [place]  FDA and has been authorized for detection and/or diagnosis of SARS-CoV-2 by FDA under an Emergency Use Authorization (EUA). This EUA will remain in effect (meaning this test can be used) for the duration of the COVID-19 declaration under Section 564(b)(1) of the Act, 21 U.S.C. section 360bbb-3(b)(1), unless the authorization is terminated or revoked.  Performed at [facility], [address], [place], [place] [address]     Today   Subjective    Bethanie Bloxom today has no new complaints - Cough and respiratory symptoms improved significantly     - Ambulatory with mobility specialist no significant dyspnea on exertion, O2 sats 95 to 96% on room air with ambulation   Patient has been seen and examined prior to discharge   Objective   Blood pressure (!) 142/69, pulse 77, temperature 97.6 F (36.4 C), temperature source Oral, resp. rate 16, height 5' 4 (1.626 m), weight 61.2 kg, SpO2 95%.   Intake/Output Summary (Last 24 hours) at 08/09/2024 1325 Last data filed at 08/09/2024 0818 Gross per 24 hour  Intake 303 ml  Output 1200 ml  Net -897 ml    Exam Gen:- Awake Alert, no acute distress  HEENT:- [place].AT, No sclera  icterus Neck-Supple Neck,No JVD,.  Lungs-improved air movement, no significant wheezing CV- S1, S2 normal, regular Abd-  +ve B.Sounds, Abd Soft, No tenderness,    Extremity/Skin:- No  edema,   good pulses Psych-affect is appropriate, oriented x3 Neuro-no new focal deficits, no tremors    Data Review   CBC w Diff:  Lab Results  Component Value Date   WBC 14.0 (H) 08/08/2024   HGB 11.1 (L) 08/08/2024   HCT 35.4 (L) 08/08/2024   PLT 247 08/08/2024   LYMPHOPCT 41 08/05/2024   MONOPCT 6 08/05/2024   EOSPCT 4 08/05/2024   BASOPCT 1 08/05/2024    CMP:  Lab Results  Component Value Date   NA 138 08/09/2024   NA 140 09/11/2019   K 4.5 08/09/2024   CL 100 08/09/2024   CO2 28 08/09/2024   BUN 24 (H) 08/09/2024   BUN 12 09/11/2019   CREATININE 1.02 (H) 08/09/2024   CREATININE 0.77 09/11/2017   PROT 5.8 (L) 08/09/2024   ALBUMIN   3.8 08/09/2024   BILITOT <0.2 08/09/2024   ALKPHOS 63 08/09/2024   AST 13 (L) 08/09/2024   ALT 11 08/09/2024  .  Total Discharge time is about 33 minutes  Rendall Carwin M.D on 08/09/2024 at 1:25 PM  Go to [email or web address] -  for contact info  [facility] - Office  [phone]

## 2024-08-09 NOTE — Plan of Care (Signed)
